# Patient Record
Sex: Female | Born: 1960 | Race: Black or African American | Hispanic: No | Marital: Single | Smoking: Former smoker
Health system: Southern US, Community
[De-identification: ages and names within clinical notes are randomized; demographics above are authoritative.]

## PROBLEM LIST (undated history)

## (undated) DIAGNOSIS — D649 Anemia, unspecified: Secondary | ICD-10-CM

## (undated) DIAGNOSIS — E669 Obesity, unspecified: Secondary | ICD-10-CM

## (undated) DIAGNOSIS — N750 Cyst of Bartholin's gland: Secondary | ICD-10-CM

## (undated) DIAGNOSIS — A159 Respiratory tuberculosis unspecified: Secondary | ICD-10-CM

## (undated) DIAGNOSIS — N764 Abscess of vulva: Secondary | ICD-10-CM

## (undated) DIAGNOSIS — I10 Essential (primary) hypertension: Secondary | ICD-10-CM

## (undated) DIAGNOSIS — N898 Other specified noninflammatory disorders of vagina: Secondary | ICD-10-CM

## (undated) DIAGNOSIS — K859 Acute pancreatitis without necrosis or infection, unspecified: Secondary | ICD-10-CM

## (undated) DIAGNOSIS — M1990 Unspecified osteoarthritis, unspecified site: Secondary | ICD-10-CM

## (undated) DIAGNOSIS — T7840XA Allergy, unspecified, initial encounter: Secondary | ICD-10-CM

## (undated) DIAGNOSIS — K529 Noninfective gastroenteritis and colitis, unspecified: Secondary | ICD-10-CM

## (undated) DIAGNOSIS — F17200 Nicotine dependence, unspecified, uncomplicated: Secondary | ICD-10-CM

## (undated) HISTORY — PX: ABDOMINAL HYSTERECTOMY: SHX81

## (undated) HISTORY — PX: ABDOMINAL SURGERY: SHX537

## (undated) HISTORY — DX: Cyst of Bartholin's gland: N75.0

## (undated) HISTORY — PX: CHOLECYSTECTOMY: SHX55

## (undated) HISTORY — DX: Respiratory tuberculosis unspecified: A15.9

## (undated) HISTORY — PX: TONSILLECTOMY: SUR1361

## (undated) HISTORY — PX: TUBAL LIGATION: SHX77

## (undated) HISTORY — DX: Obesity, unspecified: E66.9

## (undated) HISTORY — DX: Other specified noninflammatory disorders of vagina: N89.8

## (undated) HISTORY — DX: Nicotine dependence, unspecified, uncomplicated: F17.200

## (undated) HISTORY — DX: Allergy, unspecified, initial encounter: T78.40XA

---

## 2000-09-07 ENCOUNTER — Encounter (INDEPENDENT_AMBULATORY_CARE_PROVIDER_SITE_OTHER): Payer: Self-pay

## 2000-09-07 ENCOUNTER — Ambulatory Visit (HOSPITAL_COMMUNITY): Admission: RE | Admit: 2000-09-07 | Discharge: 2000-09-07 | Payer: Self-pay | Admitting: Obstetrics & Gynecology

## 2001-01-30 ENCOUNTER — Encounter (INDEPENDENT_AMBULATORY_CARE_PROVIDER_SITE_OTHER): Payer: Self-pay | Admitting: Specialist

## 2001-01-30 ENCOUNTER — Observation Stay (HOSPITAL_COMMUNITY): Admission: RE | Admit: 2001-01-30 | Discharge: 2001-01-31 | Payer: Self-pay | Admitting: Obstetrics & Gynecology

## 2002-07-23 ENCOUNTER — Ambulatory Visit (HOSPITAL_COMMUNITY): Admission: RE | Admit: 2002-07-23 | Discharge: 2002-07-23 | Payer: Self-pay | Admitting: Pulmonary Disease

## 2002-07-23 ENCOUNTER — Emergency Department (HOSPITAL_COMMUNITY): Admission: EM | Admit: 2002-07-23 | Discharge: 2002-07-23 | Payer: Self-pay | Admitting: *Deleted

## 2002-08-26 ENCOUNTER — Emergency Department (HOSPITAL_COMMUNITY): Admission: EM | Admit: 2002-08-26 | Discharge: 2002-08-26 | Payer: Self-pay | Admitting: Emergency Medicine

## 2003-02-27 ENCOUNTER — Other Ambulatory Visit: Admission: RE | Admit: 2003-02-27 | Discharge: 2003-02-27 | Payer: Self-pay | Admitting: Obstetrics & Gynecology

## 2003-09-29 ENCOUNTER — Ambulatory Visit (HOSPITAL_COMMUNITY): Admission: RE | Admit: 2003-09-29 | Discharge: 2003-09-29 | Payer: Self-pay | Admitting: Pulmonary Disease

## 2004-01-19 ENCOUNTER — Encounter: Admission: RE | Admit: 2004-01-19 | Discharge: 2004-04-18 | Payer: Self-pay | Admitting: Neurology

## 2004-04-30 ENCOUNTER — Other Ambulatory Visit: Admission: RE | Admit: 2004-04-30 | Discharge: 2004-04-30 | Payer: Self-pay | Admitting: Obstetrics & Gynecology

## 2005-05-11 ENCOUNTER — Other Ambulatory Visit: Admission: RE | Admit: 2005-05-11 | Discharge: 2005-05-11 | Payer: Self-pay | Admitting: Specialist

## 2008-02-25 ENCOUNTER — Emergency Department (HOSPITAL_COMMUNITY): Admission: EM | Admit: 2008-02-25 | Discharge: 2008-02-25 | Payer: Self-pay | Admitting: Emergency Medicine

## 2008-02-27 ENCOUNTER — Emergency Department (HOSPITAL_COMMUNITY): Admission: EM | Admit: 2008-02-27 | Discharge: 2008-02-27 | Payer: Self-pay | Admitting: Emergency Medicine

## 2008-03-27 ENCOUNTER — Ambulatory Visit (HOSPITAL_COMMUNITY): Admission: RE | Admit: 2008-03-27 | Discharge: 2008-03-27 | Payer: Self-pay | Admitting: Pulmonary Disease

## 2009-09-10 ENCOUNTER — Ambulatory Visit (HOSPITAL_COMMUNITY): Admission: RE | Admit: 2009-09-10 | Discharge: 2009-09-10 | Payer: Self-pay | Admitting: Pulmonary Disease

## 2009-11-28 HISTORY — PX: FOOT SURGERY: SHX648

## 2010-08-12 ENCOUNTER — Emergency Department (HOSPITAL_COMMUNITY): Admission: EM | Admit: 2010-08-12 | Discharge: 2010-08-12 | Payer: Self-pay | Admitting: Emergency Medicine

## 2010-08-21 ENCOUNTER — Encounter: Payer: Self-pay | Admitting: Orthopedic Surgery

## 2010-09-20 ENCOUNTER — Emergency Department (HOSPITAL_COMMUNITY): Admission: EM | Admit: 2010-09-20 | Discharge: 2010-09-20 | Payer: Self-pay | Admitting: Emergency Medicine

## 2010-09-20 ENCOUNTER — Encounter: Payer: Self-pay | Admitting: Orthopedic Surgery

## 2010-09-21 ENCOUNTER — Encounter: Payer: Self-pay | Admitting: Orthopedic Surgery

## 2010-09-22 ENCOUNTER — Ambulatory Visit: Payer: Self-pay | Admitting: Orthopedic Surgery

## 2010-09-22 DIAGNOSIS — M79609 Pain in unspecified limb: Secondary | ICD-10-CM | POA: Insufficient documentation

## 2010-09-22 DIAGNOSIS — E1149 Type 2 diabetes mellitus with other diabetic neurological complication: Secondary | ICD-10-CM

## 2010-09-22 DIAGNOSIS — M659 Synovitis and tenosynovitis, unspecified: Secondary | ICD-10-CM

## 2010-10-04 ENCOUNTER — Encounter: Payer: Self-pay | Admitting: Orthopedic Surgery

## 2010-10-07 ENCOUNTER — Telehealth: Payer: Self-pay | Admitting: Orthopedic Surgery

## 2010-10-08 ENCOUNTER — Encounter: Payer: Self-pay | Admitting: Orthopedic Surgery

## 2010-10-08 ENCOUNTER — Telehealth: Payer: Self-pay | Admitting: Orthopedic Surgery

## 2010-10-14 ENCOUNTER — Telehealth: Payer: Self-pay | Admitting: Orthopedic Surgery

## 2010-10-20 ENCOUNTER — Encounter: Payer: Self-pay | Admitting: Orthopedic Surgery

## 2010-11-09 ENCOUNTER — Encounter: Payer: Self-pay | Admitting: Orthopedic Surgery

## 2010-12-28 NOTE — Letter (Signed)
Summary: *Orthopedic Consult Note  [facility]  [address]  [place], [place] [address]   Phone: [phone]  Fax: [phone]    Re:    Shawna Huerta DOB:    [birth year]   Dear: Renae Fickle   Thank you for requesting that we see the above patient for consultation.  A copy of the detailed office note will be sent under separate cover, for your review.  Evaluation today is consistent with:  1)  TENOSYNOVITIS OF FOOT AND ANKLE (ICD-727.06) 2)  DIABETIC PERIPHERAL NEUROPATHY (ICD-250.60) 3)  FOOT PAIN, BILATERAL (ICD-729.5) 4)  FAMILY HISTORY OF ARTHRITIS (ICD-V17.7) 5)  FAMILY HISTORY OF DIABETES (ICD-V18.0)   Our recommendation is for:  orthotics to support the posterior tibial tendon especially on the LEFT side Out of work for one month to rest her feet Referred her foot and ankle specialist for further diabetic foot care       Thank you for this opportunity to look after your patient.  Sincerely,   Terrance Mass. MD.

## 2010-12-28 NOTE — Letter (Signed)
Summary: Out of Work  [facility]  [address]  [place], [place] [address]   Phone: [phone]  Fax: [phone]    September 22, 2010   Employee:  LATIANA TOMEI    To Whom It May Concern:   For Medical reasons, please excuse the above named employee from work for the following dates:  Start: 09/22/10  End:   10/22/10 or until further notice  If you need additional information, please feel free to contact our office.         Sincerely,    Dr. Terrance Mass.

## 2010-12-28 NOTE — Medication Information (Signed)
Summary: Tax adviser   Imported By: Cammie Sickle 09/28/2010 15:48:29  _____________________________________________________________________  External Attachment:    Type:   Image     Comment:   External Document

## 2010-12-28 NOTE — Assessment & Plan Note (Signed)
Summary: RT FOOT PAIN XR [facility] 08/10/10/AETNA/BSF   Visit Type:  Initial Consult Referring Provider:  Dr. Juanetta Gosling Primary Provider:  Dr. Juanetta Gosling  CC:  right foot pain.  History of Present Illness: I saw Shawna Huerta in the office today for an initial visit.  She is a 50 years old woman with the complaint of:  right foot pain.  No injury.  Xrays right foot 08/12/10 [facility] for review.  Meds: Glucovance.  FYI also noted to have left ankle pain, xrays [facility] left ankle 09/20/10.  The patient has bilateral throbbing pain on the LEFT side she has medial pain on the RIGHT side just dorsal lateral pain.  The pain came on suddenly and is associated with numbness and swelling.  She denies any trauma    Allergies (verified): No Known Drug Allergies  Past History:  Past Medical History: diabetes  Past Surgical History: tonsils gallbladder hysterectomy  Family History: FH of Cancer:  Family History of Diabetes Family History of Arthritis  Social History: Patient is married.  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 Cardiovascular:  Denies chest pain, palpitations, fainting, and murmurs. Respiratory:  Complains of couch; denies short of breath, wheezing, tightness, pain on inspiration, and snoring . Gastrointestinal:  Complains of nausea and constipation; denies heartburn, vomiting, diarrhea, and blood in your stools. Genitourinary:  Complains of urgency; denies frequency, difficulty urinating, painful urination, flank pain, and bleeding in urine. Neurologic:  Denies numbness, tingling, unsteady gait, dizziness, tremors, and seizure. Musculoskeletal:  Complains of swelling; denies joint pain, instability, stiffness, redness, heat, and muscle pain. Endocrine:  Denies excessive thirst, exessive urination, and heat or cold intolerance. Psychiatric:  Denies  nervousness, depression, anxiety, and hallucinations. Skin:  Denies changes in the skin, poor healing, rash, itching, and redness. HEENT:  Complains of blurred or double vision; denies eye pain, redness, and watering. Immunology:  Denies seasonal allergies, sinus problems, and allergic to bee stings. Hemoatologic:  Denies easy bleeding and brusing.  Physical Exam  Skin:  intact without lesions or rashes Psych:  alert and cooperative; normal mood and affect; normal attention span and concentration   Foot/Ankle Exam  General:    Well-developed, well-nourished ,normal body habitus; no deformities, normal grooming.    Gait:    Normal heel-toe gait pattern bilaterally.    Inspection:    Inspection reveals no deformity, ecchymosis or swelling.   Palpation:    she seemed to have more diffuse than pinpoint tenderness.  The tenderness seemed to be in the dorsum of the foot especially on the RIGHT and then there was some tenderness medially along the posterior tibial tendon  Vascular:    she had a 2+ palpable dorsalis pedis and a 2+ palpable posterior tibial pulse, color was good capillary refill was good  Sensory:    gross sensation was intact in terms of pressure, sharp, proprioception.  The [employer] test was not performed  Motor:    gross motor strength was normal in both feet and ankles  Reflexes:    2+ reflexes  Ankle Exam:    Right:    Inspection/Palpation:  normal range of motion in the RIGHT ankle    Left:    Inspection/Palpation:  normal range of motion LEFT ankle  Anterior Drawer:    Right negative; Left negative   Impression & Recommendations:  Problem # 1:  FOOT PAIN, BILATERAL (ICD-729.5) Assessment New  Orders: Orthopedic Surgeon Referral (Ortho Surgeon)  Problem # 2:  DIABETIC PERIPHERAL NEUROPATHY (ICD-250.60) Assessment: New  Orders: New Patient Level III ([address]) very concerned that this patient probably has diabetic feet and she should see a  foot and ankle specialist.  I would also think she would benefit from orthotics for both of her feet.  Her RIGHT foot x-ray shows mid foot degenerative changes especially of the tarsal bones involving the navicular and first cuboid bone.  Overall alignment seems to be normal.  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  Patient Instructions: 1)  Diabetic Feet  2)  PTTD Type I left  3)  Refer to Dr Lestine Box  4)  Please schedule a follow-up appointment as needed.   Orders Added: 1)  Orthopedic Surgeon Referral [Ortho Surgeon] 2)  New Patient Level III [phone]

## 2010-12-28 NOTE — Letter (Signed)
Summary: History form  History form   Imported By: Jacklynn Ganong 10/07/2010 08:37:56  _____________________________________________________________________  External Attachment:    Type:   Image     Comment:   External Document

## 2010-12-28 NOTE — Progress Notes (Signed)
Summary: patient requests updated work note to RTW 10/11/10  Phone Note Call from Patient   Caller: Patient Summary of Call: Patient requests an updated return to work note for Monday 10/11/10, as states feet are feeling much better. Originally, return to work note was to return 10/22/10,"or until further notice"   New note given to patient, noting "as tolerated."   Advised patient we have faxed her referral to Dr. Lestine Box. ** NOTE: Patient has new ph# [phone] ** I have fwd'd it to Dr. Lestine Box also. Initial call taken by: Cammie Sickle,  October 08, 2010 9:16 AM

## 2010-12-28 NOTE — Progress Notes (Signed)
Summary: Appointment with Dr. Lestine Box.  Phone Note From Other Clinic   Caller: Referral Coordinator Reason for Call: Schedule Patient Appt Summary of Call: Patient has an appointment with Dr. Lestine Box on 10-20-10 at 8:40 am.  Initial call taken by: Waldon Reining,  October 14, 2010 2:52 PM

## 2010-12-28 NOTE — Letter (Signed)
Summary: [employer] disability form  [employer] form   Imported By: Cammie Sickle 10/27/2010 18:38:35  _____________________________________________________________________  External Attachment:    Type:   Image     Comment:   External Document

## 2010-12-28 NOTE — Progress Notes (Signed)
Summary: Referral to Dr. Lestine Box.  Phone Note Outgoing Call   Call placed by: Waldon Reining,  October 07, 2010 12:58 PM Call placed to: Specialist Action Taken: Information Sent Summary of Call: I faxed a referral for this patient to [facility] to Dr. Lestine Box to be seen for bilateral foot pain.

## 2010-12-28 NOTE — Consult Note (Signed)
Summary: Consultation note from Dr Lovie Chol  Consultation note from Dr Lovie Chol   Imported By: Jacklynn Ganong 10/20/2010 11:41:49  _____________________________________________________________________  External Attachment:    Type:   Image     Comment:   External Document

## 2010-12-28 NOTE — Letter (Signed)
Summary: Out of Work Updated  [facility]  [address]  [place], [place] [address]   Phone: [phone]  Fax: [phone]    October 08, 2010   Employee:  POLA FURNO    To Whom It May Concern:   For Medical reasons, please excuse the above named employee from work for the following dates:  Start:   09/22/10  End/Return to work:    10/11/10 as tolerated  (no restrictions)  If you need additional information, please feel free to contact our office.         Sincerely,    Terrance Mass, MD

## 2010-12-30 NOTE — Consult Note (Signed)
Summary: Consultation Report from Dr. Leonides Grills  Consultation Report from Dr. Leonides Grills   Imported By: Jacklynn Ganong 11/09/2010 10:38:22  _____________________________________________________________________  External Attachment:    Type:   Image     Comment:   External Document

## 2011-01-28 IMAGING — CR DG ANKLE COMPLETE 3+V*L*
3 series · 3 of 3 positions shown · non-contrast
Comparison: None.

CLINICAL DATA: Left ankle pain for 1 day.  No injury.

LEFT ANKLE COMPLETE - 3+ VIEW

[view not recorded (1 of 3)]
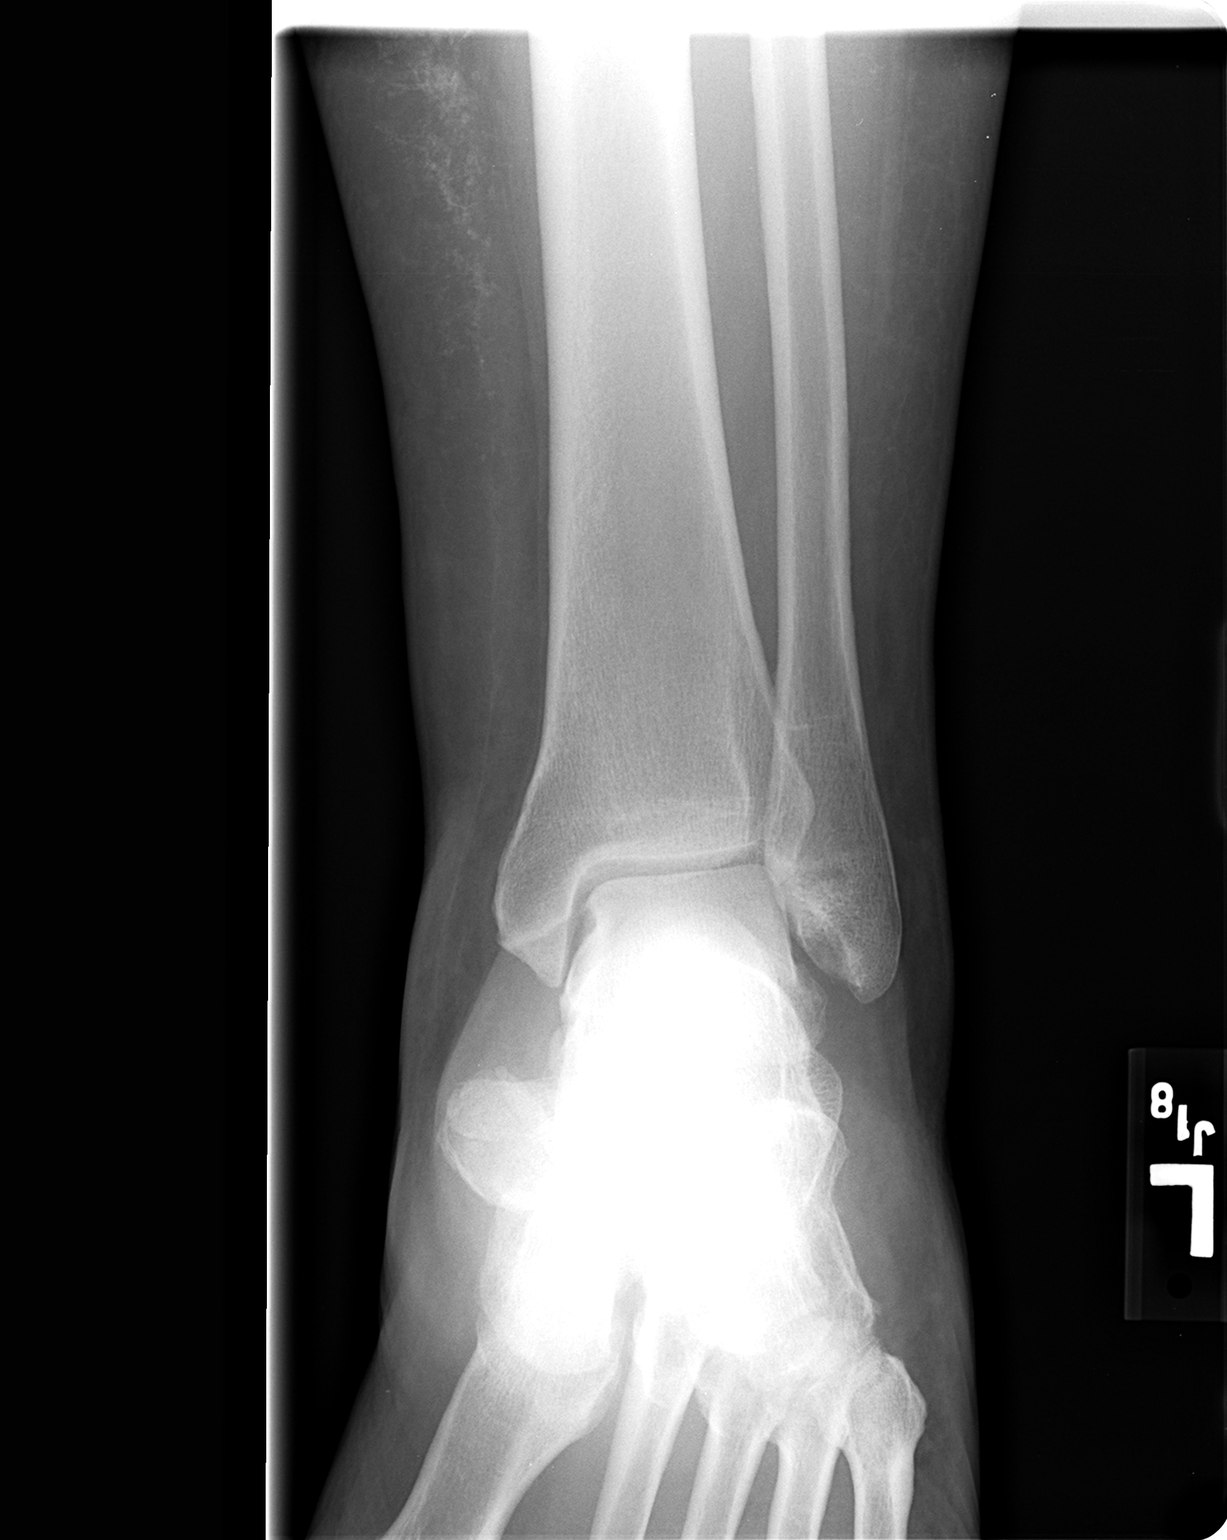

[view not recorded (2 of 3)]
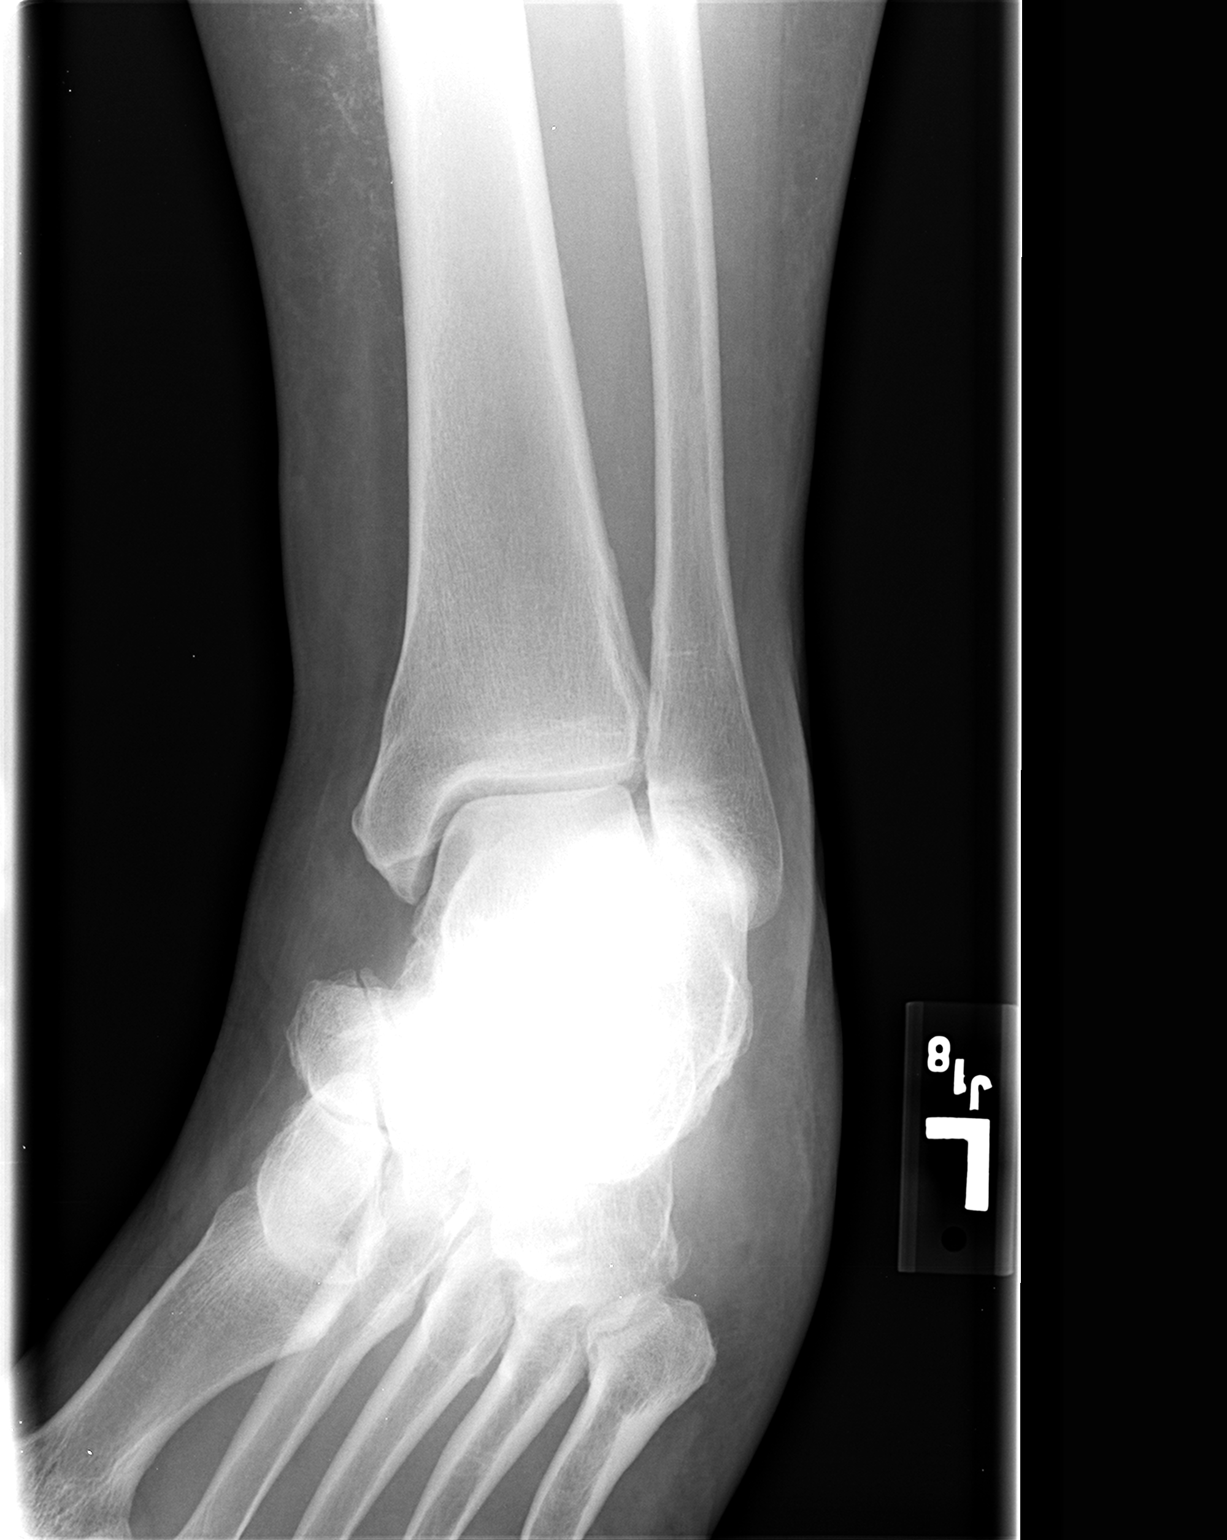

[view not recorded (3 of 3)]
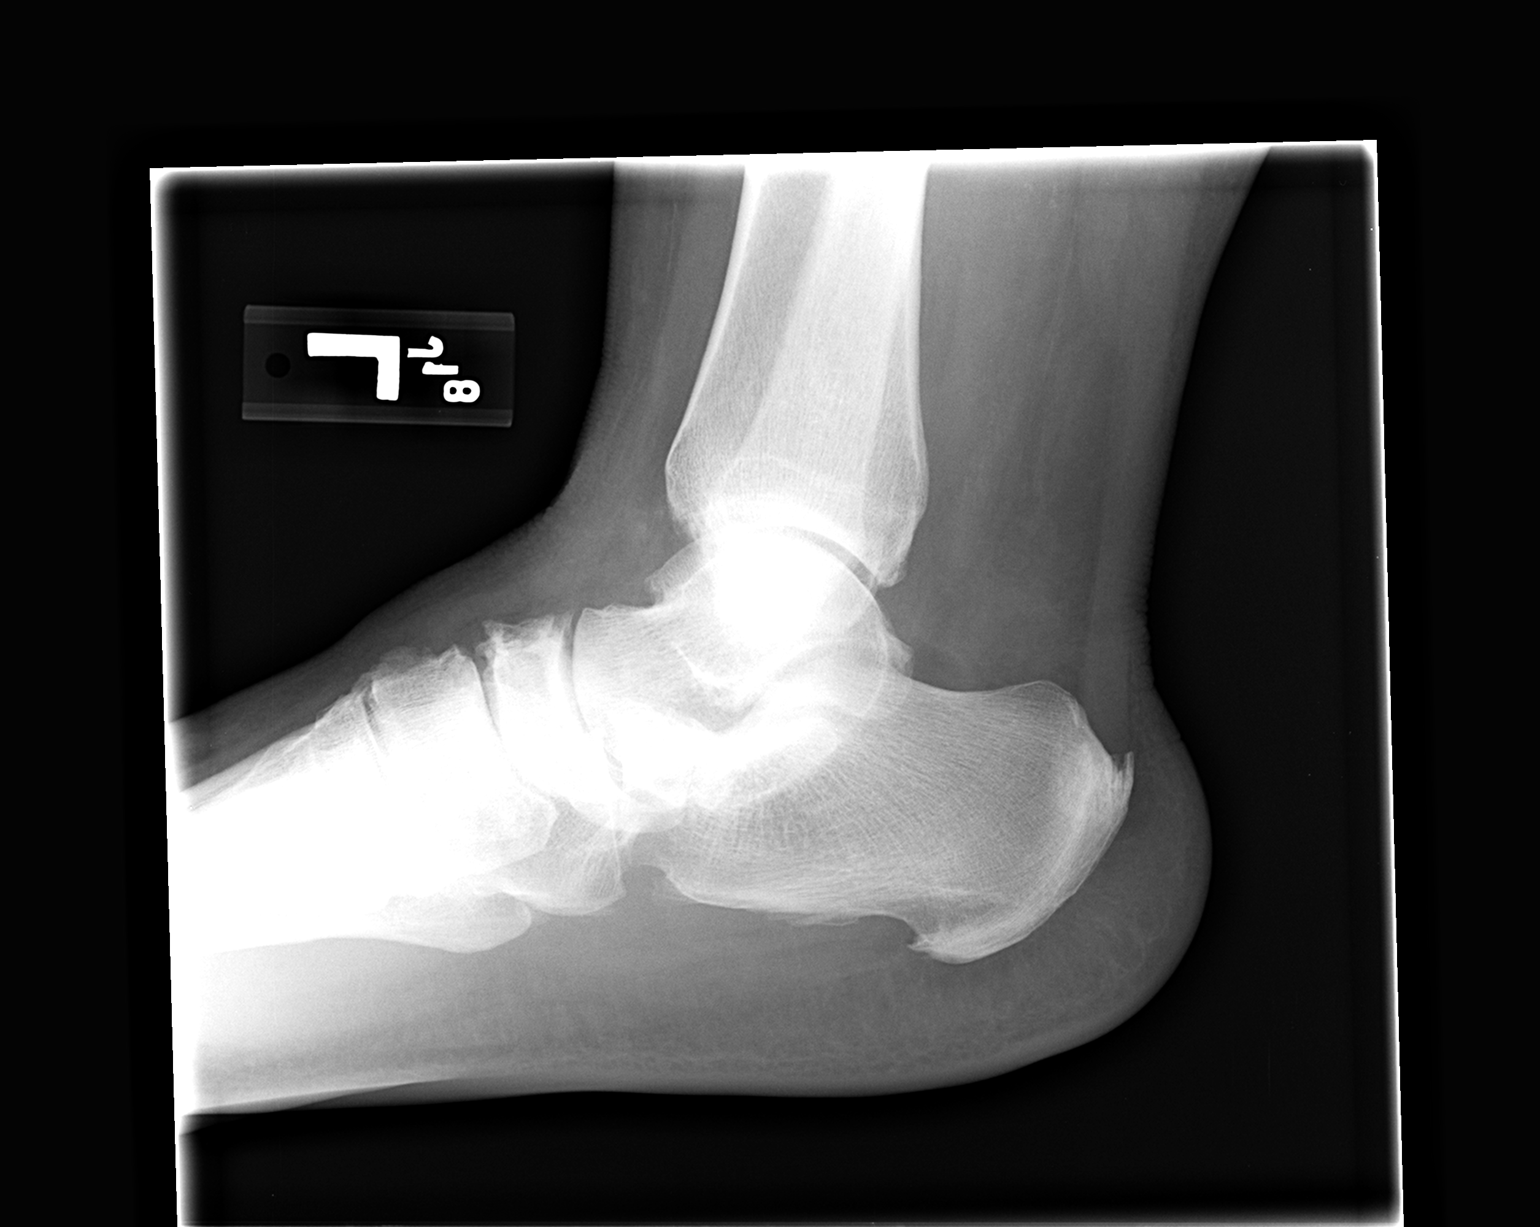

[3 of 3 positions shown; findings below may reference images not displayed]

FINDINGS: There are degenerative changes present associated with
the midfoot.  There are no fractures, dislocations, or destructive
changes.
IMPRESSION: Degenerative arthritic changes present associated with the midfoot.
No acute findings.

## 2011-04-05 ENCOUNTER — Encounter (HOSPITAL_BASED_OUTPATIENT_CLINIC_OR_DEPARTMENT_OTHER)
Admission: RE | Admit: 2011-04-05 | Discharge: 2011-04-05 | Disposition: A | Discharge status: Home / Self Care | Payer: Private Health Insurance - Indemnity | Source: Ambulatory Visit | Attending: Orthopedic Surgery | Admitting: Orthopedic Surgery

## 2011-04-05 LAB — BASIC METABOLIC PANEL
BUN: 9 mg/dL (ref 6–23)
CO2: 28 mEq/L (ref 19–32)
Calcium: 9.6 mg/dL (ref 8.4–10.5)
Chloride: 99 mEq/L (ref 96–112)
Creatinine, Ser: 0.63 mg/dL (ref 0.4–1.2)
GFR calc Af Amer: >60 mL/min (ref >60)
GFR calc non Af Amer: >60 mL/min (ref >60)
Glucose, Bld: 182 mg/dL — ABNORMAL HIGH (ref 70–99)
Potassium: 4.7 mEq/L (ref 3.5–5.1)
Sodium: 135 mEq/L (ref 135–145)

## 2011-04-06 ENCOUNTER — Ambulatory Visit (HOSPITAL_BASED_OUTPATIENT_CLINIC_OR_DEPARTMENT_OTHER)
Admission: RE | Admit: 2011-04-06 | Discharge: 2011-04-07 | Disposition: A | Discharge status: Home / Self Care | Payer: Private Health Insurance - Indemnity | Source: Ambulatory Visit | Attending: Orthopedic Surgery | Admitting: Orthopedic Surgery

## 2011-04-06 DIAGNOSIS — M659 Unspecified synovitis and tenosynovitis, unspecified site: Secondary | ICD-10-CM | POA: Insufficient documentation

## 2011-04-06 DIAGNOSIS — Q6650 Congenital pes planus, unspecified foot: Secondary | ICD-10-CM | POA: Insufficient documentation

## 2011-04-06 DIAGNOSIS — M6240 Contracture of muscle, unspecified site: Secondary | ICD-10-CM | POA: Insufficient documentation

## 2011-04-06 DIAGNOSIS — Z01810 Encounter for preprocedural cardiovascular examination: Secondary | ICD-10-CM | POA: Insufficient documentation

## 2011-04-06 DIAGNOSIS — M76829 Posterior tibial tendinitis, unspecified leg: Secondary | ICD-10-CM | POA: Insufficient documentation

## 2011-04-06 DIAGNOSIS — Q742 Other congenital malformations of lower limb(s), including pelvic girdle: Secondary | ICD-10-CM | POA: Insufficient documentation

## 2011-04-06 DIAGNOSIS — E119 Type 2 diabetes mellitus without complications: Secondary | ICD-10-CM | POA: Insufficient documentation

## 2011-04-06 DIAGNOSIS — Z01812 Encounter for preprocedural laboratory examination: Secondary | ICD-10-CM | POA: Insufficient documentation

## 2011-04-06 LAB — GLUCOSE, CAPILLARY
Glucose-Capillary: 198 mg/dL — ABNORMAL HIGH (ref 70–99)
Glucose-Capillary: 146 mg/dL — ABNORMAL HIGH (ref 70–99)

## 2011-04-06 LAB — POCT HEMOGLOBIN-HEMACUE: Hemoglobin: 10.7 g/dL — ABNORMAL LOW (ref 12.0–15.0)

## 2011-04-14 NOTE — Op Note (Signed)
NAMEARIELL, GUNNELS                  ACCOUNT NO.:  [id number]  MEDICAL RECORD NO.:  [id number]           PATIENT TYPE:  LOCATION:                                 FACILITY:  PHYSICIAN:  Leonides Grills, M.D.     DATE OF BIRTH:  08-06-1961  DATE OF PROCEDURE:  04/06/2011 DATE OF DISCHARGE:                              OPERATIVE REPORT   PREOPERATIVE DIAGNOSES: 1. Right accessory navicular. 2. Right tight gastroc. 3. Right posterior tibial tendonitis. 4. Right valgus hindfoot malalignment. 5. Dorsal lateral talonavicular joint synovitis with spur formation. 6. Obesity.  POSTOPERATIVE DIAGNOSES: 1. Right accessory navicular. 2. Right tight gastroc. 3. Right posterior tibial tendonitis. 4. Right valgus hindfoot malalignment. 5. Dorsal lateral talonavicular joint synovitis with spur formation. 6. Obesity.  OPERATIONS: 1. Right Kidner procedure. 2. Right gastroc slide. 3. Right FDL to navicular/posterior tibial tendon transfer. 4. Right medialized calcaneal osteotomy. 5. Right talonavicular joint arthrotomy with tenosynovectomy and     excision talonavicular joint spurs.  ANESTHESIA:  General.  SURGEON:  Leonides Grills, MD  ASSISTANT:  Richardean Canal, PA  ESTIMATED BLOOD LOSS:  About 200-300 mL.  COMPLICATIONS:  None.  TOURNIQUET TIME:  Minimal.  DISPOSITION:  Stable to the PR.  INDICATIONS:  This is a 50 year old female who has had persistent posteromedial ankle pain as well as dorsolateral hindfoot pain that was interfering with her life due to the above pathology.  She was consented for the above procedure.  All risk of infection, nerve and vessel injury, nonunion, malunion, hardware tissue, hardware failure, persistent pain, worse pain, prolonged recovery, stiffness, arthritis, wound healing problems, DVT, PE, Charcot arthropathy were all explained. Questions were encouraged and answered.  DESCRIPTION OF PROCEDURE:  The patient was brought to the operating room,  placed in supine position after adequate general anesthesia was administered as well as Ancef gram IV piggyback.  The right lower extremity was then prepped and draped in a sterile manner over proximally thigh tourniquet.  We started the procedure with a longitudinal incision on the medial aspect of the gastrocnemius muscle tendinous junction.  Dissection was carried down through skin. Hemostasis was obtained.  Fascia was opened in line with the incision. Conjoined region was then developed between the gastroc and soleus. Soft tissue was then elevated off the posterior aspect of gastrocnemius. Sural nerve was identified and protected posteriorly throughout the case.  Gastrocnemius was then released with a curved Mayo scissors. This had excellent release of the tight gastroc.  Due to the patient's obesity, this made this entire procedure more difficult.  The area was copiously irrigated with normal saline.  Subcu was closed with 3-0 Vicryl.  Skin was closed with 4-0 nylon.  The limb was then gravity exsanguinated and tourniquet was elevated to 290 mmHg.  A longitudinal incision over the dorsolateral aspect of the right talonavicular joint was then made.  Dissection was carried down through skin.  Hemostasis was obtained.  Extensor retinaculum was opened over the extensor digitorum longus lateral to the dorsalis pedis and deep peroneal nerves. We then identified the talonavicular joint.  Again, we incised this, performed a  capsulotomy.  There was synovitis in this area and this was then removed.  We then carefully dissected out the spurs and removed the navicular and talonavicular joint spurs with curved corners osteotome and rongeur.  Once this was done, this was then palpated and found to be adequately decompressed.  There was palpable spurs through her skin but they are over the navicular cuneiform joint, but we did not address this or they did not bother her at this time.  The area was  copiously irrigated with normal saline.  We developed a venous tourniquet and again due to her obesity, this made this entire procedure much more difficult.  Before we had a venous tourniquet, it was much more difficult to visualize and we copiously irrigated the area with normal saline.  Bone wax applied to exposed bony surfaces.  Subcu was closed with 3-0 Vicryl.  Skin was closed with 4-0 nylon.  We then made a longitudinal incision over the lateral aspect of the calcaneal tuber perpendicular to the calcaneal pitch.  Dissection was carried down directly to bone.  Soft tissue was elevated superiorly and inferiorly. Homans were placed.  Then with a sagittal saw, calcaneal osteotomy was then made.  Once this was completed, we then loosened the osteotomy by applying an osteotome followed by a lamina spreader to soften the soft tissues and allow [place] to translate the heel adequately.  Again due to the patient's obesity and size, this made this more difficult.  Once we were loosened, we were then able to translate the heel about the calcaneus about 8 mm laterally and fixed this provisionally with a 2.0-mm K-wire. We then made a longitudinal incision on the midline posterior aspect of the heel, then placed two 6.7-mm Arthrex cannulated self-drilling, self- tapping screws over a K-wire.  Once this was placed, we did this in 2 parallel screws measuring 50 and 55 mm respectively.  Once these were placed, K-wires were removed.  Stress x-rays were obtained in a lateral and axial views, showed no gross motion, fixation, proposition, excellent alignment as well.  Once this was done, we copiously irrigated the wounds with normal saline.  Skin was closed with 4-0 nylon stitch over all wounds.  We then made a longitudinal incision over the posterior tibial tendon.  Dissection was carried down through skin. Hemostasis was obtained.  Due to the fact we had no tourniquet, this again made visualization more  difficult and due to the patient's size, this also made more difficult as well.  Once we dissected down to the flexor retinaculum, posterior tibial tendon was identified.  We then traced this to the accessory navicular.  There was a loose piece of bone that was quite large, almost a grape size bone prominence.  This was then carefully dissected out and shelled out.  Once this was done, we then prepared a bed within the bone of the navicular using a rongeur and then drilled a 1.8-mm drill hole into navicular tuberosity.  This was done to place a Mini suture anchor for 2-0 FiberWire stitch.  We then placed the anchor in place but did not suture posterior tibial tendon just yet.  We then identified the FPL tendon, traced this to knot of Sherilyn Cooter.  This was then tenotomized distal as possible.  Again due to the patient's size, this made this more difficult as well as the bleeding from lack of tourniquet.  Once we obtained a tendon, we then placed a small nick in the posterior tibial tendon and  then weaved the FPL through the posterior tibial tendon and incorporated this into the repair of the posterior tibial tendon to the navicular.  Almost like a trough-type technique.  Once we did this, we then sewed and anchored this down with 2-0 FiberWire stitches and had an outstanding repair.  We then placed the 2-0 FiberWire stitch, incorporated in the Mini suture anchor through the FPL tendon and posterior tibial tendon and then sewed this down to the expose cancellous bone of the navicular tuberosity. This had an Conservation officer, historic buildings.  The remaining part of the posterior tibial tendon and FPL tendon were repaired with 2-0 FiberWire stitch. Again, this had an outstanding repair.  There was no pulsatile bleeding. Wound was copiously irrigated with normal saline.  Subcu was closed with a 3-0 Vicryl.  Skin was closed with 4-0 nylon overall wounds.  Sterile dressing was applied.  Modified Jones dressing was  applied.  The patient was stable to the PR.  Once again due to the patient's obesity, this made the procedure much more difficult.     Leonides Grills, M.D.     PB/MEDQ  D:  04/06/2011  T:  04/07/2011  Job:  [id number]  Electronically Signed by Leonides Grills M.D. on 04/14/2011 03:09:02 PM

## 2011-04-15 NOTE — Op Note (Signed)
[facility]  Patient:    Shawna Huerta, Shawna Huerta                        MRN: [id number] Attending:  Freddy Finner, M.D. CC:         referring physician   Operative Report  PREOPERATIVE DIAGNOSES:       1. Dysfunctional uterine bleeding.                               2. Thickened endometrial stripe and slight                                  enlargement of uterus most consistent with                                  adenomyosis on pelvic ultrasound.                               3. Ovarian findings consistent with polycystic                                  ovarian syndrome.  POSTOPERATIVE DIAGNOSES:      1. Dysfunctional uterine bleeding.                               2. Thickened endometrial stripe and slight                                  enlargement of uterus most consistent with                                  adenomyosis on pelvic ultrasound.                               3. Ovarian findings consistent with polycystic                                  ovarian syndrome.                               4. Slight uterine enlargement.  Uterus sounds to                                  9.5 cm.                               5. Small endometrial polyp.                               6. Shaggy thickening of endometrial lining.  OPERATIVE PROCEDURE:  Hysteroscopy, dilatation and curettage.  SURGEON:                      Freddy Finner, M.D.  ANESTHESIA:                   General.  COMPLICATIONS:                None.  ESTIMATED BLOOD LOSS:         20 cc.                                Patient is a 50 year old black married female gravida 2, para 1 who has previously had tubal sterilization and who was followed by her regular medical doctor, Dr. Gerda Diss, for irregular vaginal bleeding of two to three years duration.  She has been tried on oral contraceptive pills without successful management for bleeding and she was seen in my office for the first  time on August 30, 2000 at which time her pelvic examination was thought to be remarkable for slight enlargement of the uterus, but was somewhat compromised by her body habitus.  Her hemoglobin at that time was 9.3.  Pelvic ultrasound was obtained which showed the uterus to be 8.8 x 5.5 x 4.9 cm with a 9.8 mm endometrial stripe and calcifications seen in the wall of the uterus.  There were fibrotic type changes in the myometrium possibly consistent with adenomyosis.  The ovaries were essentially normal, but had polyfollicular appearance of small follicles consistent with polycystic ovarian syndrome.  She is admitted now for surgery.                                She was admitted on morning of surgery, brought to the operating room, placed under adequate general anesthesia at her request.  She was placed in the dorsal lithotomy position using the Allen stirrups.  Betadine prep of perineum and vagina was carried out in the usual fashion.  Sterile drapes were applied.  Vivelle speculum was introduced and the cervix was ______ and grasped with a single tooth tenaculum.  Uterus sounded to 9.5 cm.  Cervix was progressively dilated to 23 with Shawnie Pons.  A 12.5 degree [facility] hysteroscope was introduced using 3% sorbitol as a distending medium.  Inspection of the endometrial cavity revealed what was thought to be some small polyps and a shaggy endometrial appearance.  The endometrium was carefully curetted with the Heaney curette and ______ forward with Garen Grams forceps.  Repeat inspection revealed adequate sampling and removal of the polyp.  The procedure was terminated at this point.  Sorbitol deficit was 40 cc.  Estimated intraoperative blood loss was less than or equal to 20 cc. The patient was awakened and taken to the recovery room in good condition. Will be discharged home with routine outpatient surgical instructions for follow-up in approximately two weeks in the office.  She is given  Darvocet to be taken as needed for postoperative pain.  She is to take Advil or Tylenol for less severe pain. DD:  09/06/00 TD:  09/07/00 Job: [id number]

## 2011-04-15 NOTE — Op Note (Signed)
[facility]  Patient:    Shawna Huerta, Shawna Huerta                        MRN: [id number] Proc. Date: 01/30/01 Adm. Date:  [id number] Attending:  Minette Headland                           Operative Report  PREOPERATIVE DIAGNOSIS:       Menorrhagia, suspected adenomyosis.  POSTOPERATIVE DIAGNOSIS:      Menorrhagia, suspected adenomyosis.  OPERATIVE PROCEDURE:          Total vaginal hysterectomy.  SURGEON:                      Freddy Finner, M.D.  ASSISTANT:                    Guy Sandifer. Arleta Creek, M.D.  ESTIMATED INTRAOPERATIVE BLOOD LOSS:  150 cc.  ANESTHESIA:                   General endotracheal.  INTRAOPERATIVE COMPLICATIONS:  None.  INDICATIONS:                  Details of the present illness are recorded in the admission note.  Patient was admitted on the morning of surgery, received a gram of Cefotan IV preoperatively and was placed in PAS hose.  DESCRIPTION OF PROCEDURE:     She was brought to the operating room, placed under adequate general endotracheal anesthesia and placed in the dorsal lithotomy position using the [facility] stirrup system.  Betadine prep with scrubbing followed by solution were carried out in the usual fashion and sterile drapes were applied.  A posterior weighted retractor was placed. Deavers were used to retract the anterior and lateral vaginal walls.  Cervix was visualized grossly with a Jacobs tenaculum.  Colpotomy incision was made while tenting the cul-de-sac with an Allis.  Cervix was circumscribed with a scalpel.  The ligature sutureless clamp was then used to develop uterosacral pedicles, bladder pillars, cardinal ligament pedicles and each were sealed and divided sharply.  The anterior peritoneum was entered.  Vessel pedicles were taken with the same instrument, sealed and divided and pedicle was taken above the vessels on each side.  The uterus was then delivered through the introitus.  The ligature clamp was  placed across the uteroovarian pedicle and sealed and divided on each side.  Angles of the vagina were then anchored to the uterosacrals with a mattress suture of 0 Monocryl.  Uterosacrals were plicated and the posterior peritoneum closed with a single interrupted 0 Monocryl suture.  Cuff was closed vertically with figure-of-eights of 0 Monocryl.  Hemostasis was complete.  Tubes and ovaries were inspected prior to closure of the cuff and were felt to be normal.  Foley catheter was placed. Patient was taken to recovery in good condition. DD:  01/30/01 TD:  01/30/01 Job: [id number]

## 2011-04-15 NOTE — Discharge Summary (Signed)
[facility]  Patient:    Shawna Huerta, Shawna Huerta                        MRN: [id number] Adm. Date:  [id number] Disc. Date: [id number] Attending:  Minette Headland                           Discharge Summary  DISCHARGE DIAGNOSES:          Uterine enlargement, focal adenomyosis.  PROCEDURE:                    Total vaginal hysterectomy.  COMPLICATIONS:                None.  DISPOSITION:                  Patient was in satisfactory and improved condition at the time of her discharge.  She is to have progressively increasing physical activity.  She is to take a regular diet.  She is to report to the office in approximately two weeks for postoperative followup. She is to call for fever, severe pain, or heavy bleeding.  She is given Percocet to take as needed for pain.  HISTORY OF PRESENT ILLNESS:   Recorded in the admission note.  PAST MEDICAL HISTORY:         Recorded in the admission note.  FAMILY HISTORY:               Recorded in the admission note.  REVIEW OF SYSTEMS:            Recorded in the admission note.  PHYSICAL EXAMINATION  PELVIC:                       Enlargement of the uterus.  She had clinical symptoms of severe menorrhagia unresponsive to oral contraceptives and unresponsive to previous D&C and cyclic progestin.  She was admitted at this time for vaginal surgery.  LABORATORIES:                 Admission hemoglobin 9.4, postoperative hemoglobin 8.9.  HOSPITAL COURSE:              Patient was admitted on the morning of surgery. The above described procedure was accomplished without difficulty.  The patients postoperative course was without major complications.  She did have a low grade temperature to 100 on the first postoperative evening but by the evening of the first postoperative day she had a relatively normal white count of 11.9.  She was afebrile.  Hemoglobin was 8.9.  The abdomen was soft.  She was having no vaginal bleeding.   She was tolerating a regular diet and having adequate bladder function.  She was discharged home with disposition as noted above.  Will follow-up in the office in approximately two weeks. DD:  02/15/01 TD:  02/16/01 Job: [id number]

## 2011-04-15 NOTE — H&P (Signed)
[facility]  Patient:    Shawna Huerta, Shawna Huerta                        MRN: [id number] Adm. Date:  01/30/01 Attending:  Freddy Finner, M.D.                         History and Physical  ADMITTING DIAGNOSES:          1. Uterine adenomyosis.                               2. Severe menorrhagia unresponsive to oral                                  contraceptives in the past, unresponsive to                                  hysteroscopy dilation and curettage and                                  cyclic progestin.  HISTORY OF PRESENT ILLNESS:   The patient is a 50 year old black single female, gravida 2, para 1, who has a long history of irregular menses of 2-3 years duration.  She has tried oral contraceptives for control of her bleeding without success.  Her initial examination in my office in October 2001, her hemoglobin was 9.6 and was 9.6 on her most recent exam here in the office on January 26, 2001.  She had hysteroscopy D&C at the [facility] on September 07, 2000.  At that time, a small endometrial polyp was noted, and there was shaggy thickening of the endometrial lining.  This was benign histologically.  The patient has persisted with her regular menorrhagia, and she has requested definitive surgical intervention.  She is admitted at this time for vaginal hysterectomy.  REVIEW OF SYSTEMS:            Her current review of systems is otherwise negative.  There are no cardiopulmonary, GI, or GU complaints.  PAST MEDICAL HISTORY:         She has a history of rheumatic fever, reflux esophagitis.  She has previously been treated for gonorrhea.  She has a history of drug abuse in the past but is not currently using drugs of any kind.  PAST SURGICAL HISTORY:        Removal of her gallbladder and bilateral tubal ligation.  She had one vaginal birth at [redacted] weeks gestation in 56.  That child is alive and well.  She has no known allergies to medications.   She  has never had a blood transfusion.  She does not use cigarettes.  FAMILY HISTORY:               Noncontributory.  PHYSICAL EXAMINATION:  GENERAL:                      The patient is a massively obese black female in no acute distress at the time of the exam.  HEENT:  Grossly within normal limits.  NECK:                         Thyroid gland is not palpably enlarged.  CHEST:                        Clear to auscultation.  There are no rales or rhonchi heard.  HEART:                        Normal sinus rhythm without murmur, rub or gallops.  ABDOMEN:                      Obese.  PELVIC:                       External genitalia, vagina, and cervix are normal to inspection.  There is good descent of the cervix with traction. Bimanual the uterus is slightly increased in size, but this is difficult due to the patients body habitus.  There are no palpable adnexal masses. Rectovaginal exam confirms these findings.  ASSESSMENT:                   Menorrhagia and dysfunctional uterine bleeding, unresponsive to conservative therapy.  PLAN:                         Total vaginal hysterectomy.  The patient has reviewed a video in the office describing the operative procedure and has decided to proceed. DD:  01/29/01 TD:  01/29/01 Job: [id number]

## 2011-08-22 LAB — URINALYSIS, ROUTINE W REFLEX MICROSCOPIC
Bilirubin Urine: NEGATIVE
Glucose, UA: NEGATIVE
Hgb urine dipstick: NEGATIVE
Ketones, ur: NEGATIVE
Nitrite: NEGATIVE
Protein, ur: NEGATIVE
Specific Gravity, Urine: >1.03 — ABNORMAL HIGH
Urobilinogen, UA: 0.2
pH: 5

## 2011-09-28 ENCOUNTER — Other Ambulatory Visit (HOSPITAL_COMMUNITY): Payer: Self-pay | Admitting: Family Medicine

## 2011-09-28 DIAGNOSIS — Z139 Encounter for screening, unspecified: Secondary | ICD-10-CM

## 2011-10-03 ENCOUNTER — Ambulatory Visit (HOSPITAL_COMMUNITY)
Admission: RE | Admit: 2011-10-03 | Discharge: 2011-10-03 | Disposition: A | Discharge status: Home / Self Care | Payer: Private Health Insurance - Indemnity | Source: Ambulatory Visit | Attending: Family Medicine | Admitting: Family Medicine

## 2011-10-03 DIAGNOSIS — Z1231 Encounter for screening mammogram for malignant neoplasm of breast: Secondary | ICD-10-CM | POA: Insufficient documentation

## 2011-10-03 DIAGNOSIS — Z139 Encounter for screening, unspecified: Secondary | ICD-10-CM

## 2012-03-07 ENCOUNTER — Emergency Department (HOSPITAL_COMMUNITY)
Admission: EM | Admit: 2012-03-07 | Discharge: 2012-03-08 | Disposition: A | Discharge status: Home / Self Care | Payer: Private Health Insurance - Indemnity | Attending: Emergency Medicine | Admitting: Emergency Medicine

## 2012-03-07 ENCOUNTER — Encounter (HOSPITAL_COMMUNITY): Payer: Self-pay | Admitting: *Deleted

## 2012-03-07 DIAGNOSIS — A599 Trichomoniasis, unspecified: Secondary | ICD-10-CM

## 2012-03-07 DIAGNOSIS — R109 Unspecified abdominal pain: Secondary | ICD-10-CM | POA: Insufficient documentation

## 2012-03-07 DIAGNOSIS — F17200 Nicotine dependence, unspecified, uncomplicated: Secondary | ICD-10-CM | POA: Insufficient documentation

## 2012-03-07 DIAGNOSIS — E119 Type 2 diabetes mellitus without complications: Secondary | ICD-10-CM | POA: Insufficient documentation

## 2012-03-07 DIAGNOSIS — R197 Diarrhea, unspecified: Secondary | ICD-10-CM | POA: Insufficient documentation

## 2012-03-07 DIAGNOSIS — N72 Inflammatory disease of cervix uteri: Secondary | ICD-10-CM

## 2012-03-07 DIAGNOSIS — N898 Other specified noninflammatory disorders of vagina: Secondary | ICD-10-CM | POA: Insufficient documentation

## 2012-03-07 DIAGNOSIS — N390 Urinary tract infection, site not specified: Secondary | ICD-10-CM

## 2012-03-07 LAB — URINALYSIS, ROUTINE W REFLEX MICROSCOPIC
Glucose, UA: NEGATIVE mg/dL
Ketones, ur: 15 mg/dL — AB
Nitrite: NEGATIVE
Specific Gravity, Urine: >1.03 — ABNORMAL HIGH (ref 1.005–1.030)
Urobilinogen, UA: 0.2 mg/dL (ref 0.0–1.0)
pH: 5.5 (ref 5.0–8.0)

## 2012-03-07 LAB — URINE MICROSCOPIC-ADD ON

## 2012-03-07 LAB — DIFFERENTIAL
Basophils Absolute: 0 10*3/uL (ref 0.0–0.1)
Basophils Relative: 0 % (ref 0–1)
Eosinophils Absolute: 0.1 10*3/uL (ref 0.0–0.7)
Eosinophils Relative: 1 % (ref 0–5)
Lymphocytes Relative: 27 % (ref 12–46)
Lymphs Abs: 2.8 10*3/uL (ref 0.7–4.0)
Monocytes Absolute: 0.6 10*3/uL (ref 0.1–1.0)
Monocytes Relative: 6 % (ref 3–12)
Neutro Abs: 6.8 10*3/uL (ref 1.7–7.7)
Neutrophils Relative %: 66 % (ref 43–77)

## 2012-03-07 LAB — BASIC METABOLIC PANEL
BUN: 6 mg/dL (ref 6–23)
CO2: 26 mEq/L (ref 19–32)
Calcium: 9.6 mg/dL (ref 8.4–10.5)
Chloride: 102 mEq/L (ref 96–112)
Creatinine, Ser: 0.63 mg/dL (ref 0.50–1.10)
GFR calc Af Amer: >90 mL/min (ref >90)
GFR calc non Af Amer: >90 mL/min (ref >90)
Glucose, Bld: 90 mg/dL (ref 70–99)
Potassium: 3.5 mEq/L (ref 3.5–5.1)
Sodium: 139 mEq/L (ref 135–145)

## 2012-03-07 LAB — CBC
HCT: 34.5 % — ABNORMAL LOW (ref 36.0–46.0)
Hemoglobin: 10.7 g/dL — ABNORMAL LOW (ref 12.0–15.0)
MCH: 20.9 pg — ABNORMAL LOW (ref 26.0–34.0)
MCHC: 31 g/dL (ref 30.0–36.0)
MCV: 67.3 fL — ABNORMAL LOW (ref 78.0–100.0)
Platelets: 208 10*3/uL (ref 150–400)
RBC: 5.13 MIL/uL — ABNORMAL HIGH (ref 3.87–5.11)
RDW: 17.2 % — ABNORMAL HIGH (ref 11.5–15.5)
WBC: 10.3 10*3/uL (ref 4.0–10.5)

## 2012-03-07 LAB — WET PREP, GENITAL
Clue Cells Wet Prep HPF POC: NONE SEEN
Yeast Wet Prep HPF POC: NONE SEEN

## 2012-03-07 MED ORDER — CEFTRIAXONE SODIUM 250 MG IJ SOLR
250.0000 mg | Freq: Once | INTRAMUSCULAR | Status: AC
  Administered 2012-03-08: 250 mg via INTRAMUSCULAR
  Filled 2012-03-07: qty 250

## 2012-03-07 MED ORDER — AZITHROMYCIN 250 MG PO TABS
1000.0000 mg | ORAL_TABLET | Freq: Once | ORAL | Status: AC
  Administered 2012-03-08: 1000 mg via ORAL
  Filled 2012-03-07: qty 4

## 2012-03-07 NOTE — ED Provider Notes (Signed)
History   Scribed for Shawna Hutching, MD, the patient was seen in room APA18/APA18 . This chart was scribed by Lewanda Rife.   CSN: [id number]  Arrival date & time 03/07/12  [place]ully   First MD Initiated Contact with Patient 03/07/12 2158      Chief Complaint  Patient presents with  . Abdominal Pain    (Consider location/radiation/quality/duration/timing/severity/associated sxs/prior treatment) HPI Shawna Huerta is a 51 y.o. female who presents to the Emergency Department complaining of mild to moderate suprapubic abdominal pain for the past 10 days. Pt describes pain as "tight." Pt reports associated watery diarrhea and unusual light brown vaginal discharge. Pt denies nausea, emesis, hematochezia, melena, and pus in stool. Pt states she has 7-8 episodes of diarrhea a day. Pt reports eating and drinking fluids normally. Pt states last sexual encounter she engaged in was unprotected about 3 months ago.   G2 P1 AB1   Past Medical History  Diagnosis Date  . Diabetes mellitus     Past Surgical History  Procedure Date  . Abdominal surgery   . Tonsillectomy   . Cholecystectomy   . Abdominal hysterectomy   . Foot surgery     No family history on file.  History  Substance Use Topics  . Smoking status: Current Everyday Smoker -- 0.5 packs/day  . Smokeless tobacco: Not on file  . Alcohol Use: No    OB History    Grav Para Term Preterm Abortions TAB SAB Ect Mult Living                  Review of Systems  Constitutional: Negative.   HENT: Negative.   Respiratory: Negative.   Cardiovascular: Negative.   Gastrointestinal: Positive for abdominal pain and diarrhea. Negative for nausea and vomiting.  Genitourinary: Positive for vaginal discharge. Negative for dysuria, frequency, vaginal bleeding and difficulty urinating.  Musculoskeletal: Negative.   Skin: Negative.   Neurological: Negative.   Hematological: Negative.   Psychiatric/Behavioral: Negative.   All other systems  reviewed and are negative.    Allergies  Iohexol  Home Medications   Current Outpatient Rx  Name Route Sig Dispense Refill  . GLYBURIDE-METFORMIN 5-500 MG PO TABS Oral Take 1 tablet by mouth 2 (two) times daily.    . TETRAHYDROZOLINE HCL 0.05 % OP SOLN Both Eyes Place 1 drop into both eyes 2 (two) times a week.      BP 173/94  Pulse 82  Temp(Src) 98.1 F (36.7 C) (Oral)  Resp 20  Ht 5\' 7"  (1.702 m)  Wt 290 lb (131.543 kg)  BMI 45.42 kg/m2  SpO2 100%  Physical Exam  Nursing note and vitals reviewed. Constitutional: She is oriented to person, place, and time. She appears well-developed and well-nourished.  HENT:  Head: Normocephalic and atraumatic.  Eyes: Conjunctivae and EOM are normal. Pupils are equal, round, and reactive to light.  Neck: Normal range of motion. Neck supple.  Cardiovascular: Normal rate and regular rhythm.   Pulmonary/Chest: Effort normal and breath sounds normal.  Abdominal: Soft. Bowel sounds are normal. There is tenderness.       Mildly tender in suprapubic region   Genitourinary: Uterus normal. Cervix exhibits motion tenderness and discharge. Right adnexum displays tenderness.       Normal appearing genitalia  Copious light yellow discharge  Right adnexal mild to moderate tenderness   Cervical motion tenderness   Musculoskeletal: Normal range of motion.  Neurological: She is alert and oriented to person, place, and time.  Skin:  Skin is warm and dry.  Psychiatric: She has a normal mood and affect.    ED Course  Procedures (including critical care time) 10:18 PM Pt informed of blood work, and pelvic exam. Will follow up with pt after reviewing results.   Labs Reviewed  CBC - Abnormal; Notable for the following:    RBC 5.13 (*)    Hemoglobin 10.7 (*)    HCT 34.5 (*)    MCV 67.3 (*)    MCH 20.9 (*)    RDW 17.2 (*)    All other components within normal limits  URINALYSIS, ROUTINE W REFLEX MICROSCOPIC - Abnormal; Notable for the following:      Specific Gravity, Urine >1.030 (*)    Hgb urine dipstick SMALL (*)    Bilirubin Urine SMALL (*)    Ketones, ur 15 (*)    Protein, ur TRACE (*)    Leukocytes, UA MODERATE (*)    All other components within normal limits  WET PREP, GENITAL - Abnormal; Notable for the following:    Trich, Wet Prep FEW (*)    WBC, Wet Prep HPF POC TOO NUMEROUS TO COUNT (*)    All other components within normal limits  URINE MICROSCOPIC-ADD ON - Abnormal; Notable for the following:    Squamous Epithelial / LPF FEW (*)    Bacteria, UA MANY (*)    All other components within normal limits  DIFFERENTIAL  BASIC METABOLIC PANEL  GC/CHLAMYDIA PROBE AMP, GENITAL   No results found.   No diagnosis found.    MDM   patient is suprapubic pain, diarrhea, vaginal discharge. Pelvic exam shows cervicitis but no PID. Will treat for cervicitis, trichomonas, urinary tract infection.  Lomotil for diarrhea       I personally performed the services described in this documentation, which was scribed in my presence. The recorded information has been reviewed and considered.    Shawna Hutching, MD 03/08/12 Rich Fuchs

## 2012-03-07 NOTE — ED Notes (Signed)
Diarrhea for over a week, abdominal pain onset x 4 days

## 2012-03-08 MED ORDER — DIPHENOXYLATE-ATROPINE 2.5-0.025 MG PO TABS: 1.0000 | ORAL_TABLET | Freq: Four times a day (QID) | ORAL | Status: AC | PRN

## 2012-03-08 MED ORDER — METRONIDAZOLE 500 MG PO TABS: 500.0000 mg | ORAL_TABLET | Freq: Two times a day (BID) | ORAL | Status: AC

## 2012-03-08 MED ORDER — NITROFURANTOIN MONOHYD MACRO 100 MG PO CAPS: 100.0000 mg | ORAL_CAPSULE | Freq: Two times a day (BID) | ORAL | Status: AC

## 2012-03-08 NOTE — ED Notes (Signed)
Discharge instructions given and reviewed with patient.  Prescriptions given for Macrobid, Flagyl and Lomotil; effects and use explained for each. Patient verbalized understanding to take medications as directed and to follow-up with her PMD as needed.  Patient ambulatory; discharged home in good condition.

## 2012-03-08 NOTE — Discharge Instructions (Signed)
Cervicitis Cervicitis is a soreness and swelling (inflammation) of the cervix. Your cervix is located at the bottom of your uterus which opens up to the vagina.  CAUSES   Sexually transmitted infections (STIs).   Allergic reaction.   Medicines or birth control devices that are put in the vagina.   Injury to the cervix.   Bacterial infections.  SYMPTOMS  There may be no symptoms. If symptoms occur, they may include:  Grey, white, yellow, or bad smelling vaginal discharge.   Pain or itching of the area outside the vagina.   Painful sexual intercourse.   Lower abdominal or lower back pain, especially during intercourse.   Frequent urination.   Abnormal vaginal bleeding between periods, after sexual intercourse, or after menopause.   Pressure or a heavy feeling in the pelvis.  DIAGNOSIS  Diagnosis is made after a pelvic exam. Other tests may include:  Examination of any discharge under a microscope (wet prep).   A Pap test.  TREATMENT  Treatment will depend on the cause of cervicitis. If it is caused by an STI, both you and your partner will need to be treated. Antibiotic medicines will be given. HOME CARE INSTRUCTIONS   Do not have sexual intercourse until your caregiver says it is okay.   Do not have sexual intercourse until your partner has been treated if your cervicitis is caused by an STI.   Take your antibiotics as directed. Finish them even if you start to feel better.  SEEK IMMEDIATE MEDICAL CARE IF:   Your symptoms come back.   You have a fever.   You experience any problems that may be related to the medicine you are taking.  MAKE SURE YOU:   Understand these instructions.   Will watch your condition.   Will get help right away if you are not doing well or get worse.  Document Released: 11/14/2005 Document Revised: 11/03/2011 Document Reviewed: 06/13/2011 [facility] Patient Information 2012 [place], [place].   We are treating her for cervicitis which  is a infection surrounding her cervix. Additionally you have been treated for Trichomonas and a urinary tract infection.  2 antibiotics prescribed for one week. Followup your primary care Dr.

## 2012-03-09 LAB — GC/CHLAMYDIA PROBE AMP, GENITAL
Chlamydia, DNA Probe: NEGATIVE
GC Probe Amp, Genital: NEGATIVE

## 2012-09-23 ENCOUNTER — Emergency Department (HOSPITAL_COMMUNITY)
Admission: EM | Admit: 2012-09-23 | Discharge: 2012-09-23 | Disposition: A | Discharge status: Home / Self Care | Payer: Private Health Insurance - Indemnity | Attending: Emergency Medicine | Admitting: Emergency Medicine

## 2012-09-23 ENCOUNTER — Encounter (HOSPITAL_COMMUNITY): Payer: Self-pay | Admitting: Emergency Medicine

## 2012-09-23 DIAGNOSIS — Z9889 Other specified postprocedural states: Secondary | ICD-10-CM | POA: Insufficient documentation

## 2012-09-23 DIAGNOSIS — M25579 Pain in unspecified ankle and joints of unspecified foot: Secondary | ICD-10-CM | POA: Insufficient documentation

## 2012-09-23 DIAGNOSIS — Z79899 Other long term (current) drug therapy: Secondary | ICD-10-CM | POA: Insufficient documentation

## 2012-09-23 DIAGNOSIS — F17200 Nicotine dependence, unspecified, uncomplicated: Secondary | ICD-10-CM | POA: Insufficient documentation

## 2012-09-23 DIAGNOSIS — E119 Type 2 diabetes mellitus without complications: Secondary | ICD-10-CM | POA: Insufficient documentation

## 2012-09-23 DIAGNOSIS — M79671 Pain in right foot: Secondary | ICD-10-CM

## 2012-09-23 MED ORDER — HYDROCODONE-ACETAMINOPHEN 7.5-325 MG PO TABS: 1.0000 | ORAL_TABLET | ORAL | Status: DC | PRN

## 2012-09-23 MED ORDER — PROMETHAZINE HCL 12.5 MG PO TABS
12.5000 mg | ORAL_TABLET | Freq: Once | ORAL | Status: AC
  Administered 2012-09-23: 12.5 mg via ORAL
  Filled 2012-09-23: qty 1

## 2012-09-23 MED ORDER — KETOROLAC TROMETHAMINE 10 MG PO TABS
10.0000 mg | ORAL_TABLET | Freq: Once | ORAL | Status: AC
  Administered 2012-09-23: 10 mg via ORAL
  Filled 2012-09-23: qty 1

## 2012-09-23 MED ORDER — HYDROCODONE-ACETAMINOPHEN 5-325 MG PO TABS
2.0000 | ORAL_TABLET | Freq: Once | ORAL | Status: AC
  Administered 2012-09-23: 2 via ORAL
  Filled 2012-09-23: qty 2

## 2012-09-23 NOTE — ED Provider Notes (Signed)
Medical screening examination/treatment/procedure(s) were performed by non-physician practitioner and as supervising physician I was immediately available for consultation/collaboration.   Madisun Hargrove L Terisa Belardo, MD 09/23/12 2321 

## 2012-09-23 NOTE — ED Provider Notes (Signed)
History     CSN: [id number]  Arrival date & time 09/23/12  1842   First MD Initiated Contact with Patient 09/23/12 2135      Chief Complaint  Patient presents with  . Foot Pain    (Consider location/radiation/quality/duration/timing/severity/associated sxs/prior treatment) HPI Comments: Patient states she had major surgery on her right foot in 2012 and currently has hardware in that foot. The patient states that she stands for approximately 8 hours daily at her job. She now has pain in the right foot that has been getting progressively worse since October 17. The patient has not had a hot area of the foot, there's been no fever, there's been no injury or trauma. Patient presents to the emergency department because the over-the-counter medications she was using is no longer helping.  The history is provided by the patient.    Past Medical History  Diagnosis Date  . Diabetes mellitus     Past Surgical History  Procedure Date  . Abdominal surgery   . Tonsillectomy   . Cholecystectomy   . Abdominal hysterectomy   . Foot surgery     No family history on file.  History  Substance Use Topics  . Smoking status: Current Every Day Smoker -- 0.5 packs/day  . Smokeless tobacco: Not on file  . Alcohol Use: No    OB History    Grav Para Term Preterm Abortions TAB SAB Ect Mult Living                  Review of Systems  Constitutional: Negative for activity change.       All ROS Neg except as noted in HPI  HENT: Negative for nosebleeds and neck pain.   Eyes: Negative for photophobia and discharge.  Respiratory: Negative for cough, shortness of breath and wheezing.   Cardiovascular: Negative for chest pain and palpitations.  Gastrointestinal: Negative for abdominal pain and blood in stool.  Genitourinary: Negative for dysuria, frequency and hematuria.  Musculoskeletal: Negative for back pain and arthralgias.       Foot pain  Skin: Negative.   Neurological: Negative for  dizziness, seizures and speech difficulty.  Psychiatric/Behavioral: Negative for hallucinations and confusion.    Allergies  Iohexol  Home Medications   Current Outpatient Rx  Name Route Sig Dispense Refill  . ACETAMINOPHEN 500 MG PO TABS Oral Take 500 mg by mouth every 6 (six) hours as needed. pain    . GLYBURIDE-METFORMIN 5-500 MG PO TABS Oral Take 1 tablet by mouth 2 (two) times daily.    . TETRAHYDROZOLINE HCL 0.05 % OP SOLN Both Eyes Place 1 drop into both eyes daily.     Marland Kitchen HYDROCODONE-ACETAMINOPHEN 7.5-325 MG PO TABS Oral Take 1 tablet by mouth every 4 (four) hours as needed for pain. 20 tablet 0    BP 128/78  Pulse 75  Temp 98.6 F (37 C)  Resp 18  Ht 5\' 7"  (1.702 m)  Wt 290 lb (131.543 kg)  BMI 45.42 kg/m2  SpO2 100%  Physical Exam  Nursing note and vitals reviewed. Constitutional: She is oriented to person, place, and time. She appears well-developed and well-nourished.  Non-toxic appearance.  HENT:  Head: Normocephalic.  Right Ear: Tympanic membrane and external ear normal.  Left Ear: Tympanic membrane and external ear normal.  Eyes: EOM and lids are normal. Pupils are equal, round, and reactive to light.  Neck: Normal range of motion. Neck supple. Carotid bruit is not present.  Cardiovascular: Normal rate, regular rhythm,  normal heart sounds, intact distal pulses and normal pulses.   Pulmonary/Chest: Breath sounds normal. No respiratory distress.  Abdominal: Soft. Bowel sounds are normal. There is no tenderness. There is no guarding.  Musculoskeletal: Normal range of motion.       There are well-healed surgical scars of the right foot. There is full range of motion of the toes of the right foot. There no lesions between the toes. There is no puncture wounds noted of the plantar surface of the right foot. The right foot is not hot. There is pain however with any movement of the foot. There is no red streaking going up the anterior lower leg. There is a negative  Homans sign. And there is no difference in the size of the right calf and the left calf.  Lymphadenopathy:       Head (right side): No submandibular adenopathy present.       Head (left side): No submandibular adenopathy present.    She has no cervical adenopathy.  Neurological: She is alert and oriented to person, place, and time. She has normal strength. No cranial nerve deficit or sensory deficit.  Skin: Skin is warm and dry.  Psychiatric: She has a normal mood and affect. Her speech is normal.    ED Course  Procedures (including critical care time)  Labs Reviewed - No data to display No results found.   1. Foot pain, right       MDM  I have reviewed nursing notes, vital signs, and all appropriate lab and imaging results for this patient. There is no evidence of infection. There is no evidence of trauma. There no changes in the vital signs at this time. Suspect that the patient is having an inflammatory attack related to placement of hardware in the right foot as well as standing for 8 hours a day on the patient's job. Patient treated with Norco 7.5 mg #20 tablets and advised to see her physician at [facility] orthopedics for formal evaluation of the right foot.       Kathie Dike, [place] 09/23/12 2249

## 2012-09-23 NOTE — ED Notes (Signed)
Pt c/o right foot pain since thursday

## 2012-09-23 NOTE — ED Notes (Signed)
Patient with no complaints at this time. Respirations even and unlabored. Skin warm/dry. Discharge instructions reviewed with patient at this time. Patient given opportunity to voice concerns/ask questions. Patient discharged at this time and left Emergency Department with steady gait.   

## 2012-09-27 ENCOUNTER — Telehealth: Payer: Self-pay | Admitting: Orthopedic Surgery

## 2012-09-27 NOTE — Telephone Encounter (Signed)
Patient had called, left message 09/26/12, requesting appointment for right foot pain. Returned call 09/27/12, following review of previous office note in EMR [employer] system from 2011 visit, and Dr. Romeo Apple had referred patient to orthopedic foot and ankle surgeon, Dr. Lestine Box.  Patient relayed when I called that she had contacted Dr. Lestine Box' office, and has been scheduled for appointment there next week.

## 2013-07-04 ENCOUNTER — Other Ambulatory Visit (HOSPITAL_COMMUNITY): Payer: Self-pay | Admitting: Pulmonary Disease

## 2013-07-04 DIAGNOSIS — Z139 Encounter for screening, unspecified: Secondary | ICD-10-CM

## 2013-07-09 ENCOUNTER — Ambulatory Visit (HOSPITAL_COMMUNITY): Payer: Private Health Insurance - Indemnity

## 2013-08-05 ENCOUNTER — Emergency Department (HOSPITAL_COMMUNITY)
Admission: EM | Admit: 2013-08-05 | Discharge: 2013-08-05 | Disposition: A | Discharge status: Home / Self Care | Payer: Private Health Insurance - Indemnity | Attending: Emergency Medicine | Admitting: Emergency Medicine

## 2013-08-05 ENCOUNTER — Emergency Department (HOSPITAL_COMMUNITY): Payer: Private Health Insurance - Indemnity

## 2013-08-05 ENCOUNTER — Encounter (HOSPITAL_COMMUNITY): Payer: Self-pay | Admitting: *Deleted

## 2013-08-05 DIAGNOSIS — E119 Type 2 diabetes mellitus without complications: Secondary | ICD-10-CM | POA: Insufficient documentation

## 2013-08-05 DIAGNOSIS — L0231 Cutaneous abscess of buttock: Secondary | ICD-10-CM | POA: Insufficient documentation

## 2013-08-05 DIAGNOSIS — Z79899 Other long term (current) drug therapy: Secondary | ICD-10-CM | POA: Insufficient documentation

## 2013-08-05 DIAGNOSIS — F17200 Nicotine dependence, unspecified, uncomplicated: Secondary | ICD-10-CM | POA: Insufficient documentation

## 2013-08-05 DIAGNOSIS — N751 Abscess of Bartholin's gland: Secondary | ICD-10-CM | POA: Insufficient documentation

## 2013-08-05 LAB — POCT I-STAT, CHEM 8
BUN: 9 mg/dL (ref 6–23)
Creatinine, Ser: 0.7 mg/dL (ref 0.50–1.10)
Glucose, Bld: 112 mg/dL — ABNORMAL HIGH (ref 70–99)
Sodium: 140 mEq/L (ref 135–145)
TCO2: 24 mmol/L (ref 0–100)

## 2013-08-05 MED ORDER — HYDROCODONE-ACETAMINOPHEN 5-325 MG PO TABS
2.0000 | ORAL_TABLET | Freq: Once | ORAL | Status: AC
  Administered 2013-08-05: 2 via ORAL
  Filled 2013-08-05: qty 2

## 2013-08-05 MED ORDER — CEPHALEXIN 500 MG PO CAPS: 500.0000 mg | ORAL_CAPSULE | Freq: Four times a day (QID) | ORAL | Status: DC

## 2013-08-05 MED ORDER — CEPHALEXIN 500 MG PO CAPS
500.0000 mg | ORAL_CAPSULE | Freq: Once | ORAL | Status: AC
  Administered 2013-08-05: 500 mg via ORAL
  Filled 2013-08-05: qty 1

## 2013-08-05 MED ORDER — DOXYCYCLINE HYCLATE 100 MG PO CAPS: 100.0000 mg | ORAL_CAPSULE | Freq: Two times a day (BID) | ORAL | Status: DC

## 2013-08-05 MED ORDER — DOXYCYCLINE HYCLATE 100 MG PO TABS
100.0000 mg | ORAL_TABLET | Freq: Once | ORAL | Status: AC
  Administered 2013-08-05: 100 mg via ORAL
  Filled 2013-08-05: qty 1

## 2013-08-05 MED ORDER — LIDOCAINE-EPINEPHRINE (PF) 2 %-1:200000 IJ SOLN
INTRAMUSCULAR | Status: AC
  Administered 2013-08-05: 22:00:00
  Filled 2013-08-05: qty 20

## 2013-08-05 MED ORDER — MORPHINE SULFATE 4 MG/ML IJ SOLN: 4.0000 mg | Freq: Once | INTRAMUSCULAR | Status: DC

## 2013-08-05 NOTE — ED Notes (Signed)
Pt reports abscess to her buttocks. Noticed on Saturday and has gotten worse. Pt states has had in the past but not in this location.

## 2013-08-05 NOTE — ED Provider Notes (Signed)
CSN: [id number]     Arrival date & time 08/05/13  1924 History   First MD Initiated Contact with Patient 08/05/13 2011     Chief Complaint  Patient presents with  . Abscess   (Consider location/radiation/quality/duration/timing/severity/associated sxs/prior Treatment) Patient is a 52 y.o. female presenting with abscess. The history is provided by the patient.  Abscess Location:  Ano-genital Ano-genital abscess location:  R buttock Size:  3 cm Abscess quality: fluctuance, induration, painful and warmth   Red streaking: yes   Progression:  Worsening Pain details:    Quality:  Sharp   Severity:  Moderate   Duration:  2 days   Timing:  Constant   Progression:  Worsening Chronicity:  Recurrent Context: diabetes   Context: not immunosuppression, not injected drug use, not insect bite/sting and not skin injury   Relieved by:  Nothing Worsened by:  Nothing tried Ineffective treatments:  None tried Associated symptoms: no anorexia, no fatigue, no fever, no headaches, no nausea and no vomiting   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 Diabetes mellitus    Past Surgical History  Procedure Laterality Date  . Abdominal surgery    . Tonsillectomy    . Cholecystectomy    . Abdominal hysterectomy    . Foot surgery    . Foot surgery  2011   No family history on file. History  Substance Use Topics  . Smoking status: Current Every Day Smoker -- 0.50 packs/day  . Smokeless tobacco: Not on file  . Alcohol Use: No   OB History   Grav Para Term Preterm Abortions TAB SAB Ect Mult Living                 Review of Systems  Constitutional: Negative for fever and fatigue.  Gastrointestinal: Negative for nausea, vomiting and anorexia.  Neurological: Negative for headaches.  All other systems reviewed and are negative.    Allergies  Iohexol  Home Medications   Current Outpatient Rx  Name  Route  Sig  Dispense  Refill  . acetaminophen (TYLENOL) 500  MG tablet   Oral   Take 500 mg by mouth every 6 (six) hours as needed. pain         . glyBURIDE-metformin (GLUCOVANCE) 5-500 MG per tablet   Oral   Take 1 tablet by mouth 2 (two) times daily.         Marland Kitchen HYDROcodone-acetaminophen (NORCO) 7.5-325 MG per tablet   Oral   Take 1 tablet by mouth every 4 (four) hours as needed for pain.   20 tablet   0   . tetrahydrozoline (VISINE) 0.05 % ophthalmic solution   Both Eyes   Place 1 drop into both eyes daily.           BP 150/72  Pulse 93  Temp(Src) 98.5 F (36.9 C) (Oral)  Resp 20  Ht 5\' 8"  (1.727 m)  Wt 262 lb (118.842 kg)  BMI 39.85 kg/m2  SpO2 100% Physical Exam  Nursing note and vitals reviewed. Constitutional: She is oriented to person, place, and time. She appears well-developed and well-nourished. No distress.  HENT:  Head: Normocephalic and atraumatic.  Eyes: EOM are normal. Pupils are equal, round, and reactive to light.  Neck: Normal range of motion. Neck supple.  Cardiovascular: Normal rate and regular rhythm.  Exam reveals no friction rub.   No murmur heard. Pulmonary/Chest: Effort normal and breath sounds normal. No respiratory distress. She  has no wheezes. She has no rales.  Abdominal: Soft. She exhibits no distension. There is no tenderness. There is no rebound.  Genitourinary:     Musculoskeletal: Normal range of motion. She exhibits no edema.  Neurological: She is alert and oriented to person, place, and time.  Skin: She is not diaphoretic.    ED Course  INCISION AND DRAINAGE Date/Time: 08/05/2013 10:13 PM Performed by: Dagmar Hait Authorized by: Dagmar Hait Consent: Verbal consent obtained. Type: abscess Body area: anogenital Location details: Bartholin's gland Anesthesia: local infiltration Local anesthetic: lidocaine 2% with epinephrine Anesthetic total: 2 ml Patient sedated: no Scalpel size: 11 Incision type: single straight Complexity: simple Drainage: bloody and  purulent Drainage amount: copious Wound treatment: drain placed Patient tolerance: Patient tolerated the procedure well with no immediate complications. Comments: Word catheter placed. Copious black discharge.   INCISION AND DRAINAGE Date/Time: 08/05/2013 10:14 PM Performed by: Dagmar Hait Authorized by: Dagmar Hait Type: abscess Body area: anogenital Location details: gluteal cleft Anesthesia: local infiltration Local anesthetic: lidocaine 2% with epinephrine Anesthetic total: 6 ml Scalpel size: 11 Incision type: elliptical Complexity: simple Drainage: purulent Drainage amount: moderate Wound treatment: wound left open Packing material: 1/2 in iodoform gauze Patient tolerance: Patient tolerated the procedure well with no immediate complications.   (including critical care time) Labs Review Labs Reviewed - No data to display Imaging Review Ct Abdomen Pelvis Wo Contrast  08/05/2013   *RADIOLOGY REPORT*  Clinical Data: Evaluate for possible Bartholin gland cyst versus buttock abscess.  CT ABDOMEN AND PELVIS WITHOUT CONTRAST  Technique:  Multidetector CT imaging of the abdomen and pelvis was performed following the standard protocol without intravenous contrast.  Comparison: CT of the abdomen and pelvis 07/23/2002.  Comment:  The study is limited for assessment of visceral, vascular and infectious abnormalities by lack of IV contrast administration.  Findings:  Pelvis:  Adjacent to the introitus on the right side there is a area of soft tissue prominence measuring approximately 4.9 x 3.5 cm which is low-intermediate attenuation (approximately 25 HU), which is favored to represent a Bartholin gland cyst or abscess.  No other acute abnormality is noted.  Specifically, no abscess in the buttocks region is identified.  Within the visualized pelvis there is no significant volume of ascites, no pneumoperitoneum and no pathologic distension of small bowel.  Status post  hysterectomy. Ovaries appear atrophic.  Musculoskeletal: There are no aggressive appearing lytic or blastic lesions noted in the visualized portions of the skeleton.  IMPRESSION: 1.  Findings, as above, suggesting Bartholin gland cyst. Correlation with physical examination is recommended. 2.  No definite buttocks abscess identified.   Original Report Authenticated By: Trudie Reed, M.D.    MDM   1. Bartholin's gland abscess   2. Abscess of buttock, right    52 year old female presents with a right buttock abscess. Present for 2 days. History of abscesses, however no history of buttock abscess. No systemic symptoms. Patient has buttock abscess on the right medial buttock. She also has swelling of the right labia with some pain about: Gland cyst area. We'll CT her pelvis to see if these to connect via a fistulous tract. CT without fistulous tract. Bartholin's gland drained with copious black discharge. Buttock abscess drained with yellow discharge. Patient placed on antibiotics - doxycycline and keflex to cover enterics and STD etiologies. Stable for discharge.     Dagmar Hait, MD 08/05/13 2219

## 2013-08-07 ENCOUNTER — Encounter (HOSPITAL_COMMUNITY): Payer: Self-pay | Admitting: Emergency Medicine

## 2013-08-07 ENCOUNTER — Emergency Department (HOSPITAL_COMMUNITY)
Admission: EM | Admit: 2013-08-07 | Discharge: 2013-08-07 | Disposition: A | Discharge status: Home / Self Care | Payer: Managed Care, Other (non HMO) | Attending: Emergency Medicine | Admitting: Emergency Medicine

## 2013-08-07 DIAGNOSIS — L0231 Cutaneous abscess of buttock: Secondary | ICD-10-CM

## 2013-08-07 DIAGNOSIS — F17200 Nicotine dependence, unspecified, uncomplicated: Secondary | ICD-10-CM | POA: Insufficient documentation

## 2013-08-07 DIAGNOSIS — N751 Abscess of Bartholin's gland: Secondary | ICD-10-CM

## 2013-08-07 DIAGNOSIS — Z79899 Other long term (current) drug therapy: Secondary | ICD-10-CM | POA: Insufficient documentation

## 2013-08-07 DIAGNOSIS — E119 Type 2 diabetes mellitus without complications: Secondary | ICD-10-CM | POA: Insufficient documentation

## 2013-08-07 MED ORDER — HYDROCODONE-ACETAMINOPHEN 5-325 MG PO TABS: 1.0000 | ORAL_TABLET | ORAL | Status: DC | PRN

## 2013-08-07 NOTE — ED Notes (Signed)
Pt here for removal of drain and packing.

## 2013-08-07 NOTE — ED Notes (Signed)
Pt seen here on Monday for abscess of Bartholin"s gland and buttock.

## 2013-08-08 ENCOUNTER — Other Ambulatory Visit (HOSPITAL_COMMUNITY): Payer: Self-pay | Admitting: Emergency Medicine

## 2013-08-09 ENCOUNTER — Other Ambulatory Visit (HOSPITAL_COMMUNITY): Payer: Self-pay | Admitting: Emergency Medicine

## 2013-08-09 DIAGNOSIS — L0591 Pilonidal cyst without abscess: Secondary | ICD-10-CM

## 2013-08-12 NOTE — ED Provider Notes (Signed)
CSN: [id number]     Arrival date & time 08/07/13  1114 History   First MD Initiated Contact with Patient 08/07/13 1136     Chief Complaint  Patient presents with  . Wound Check   (Consider location/radiation/quality/duration/timing/severity/associated sxs/prior Treatment) HPI Comments: Shawna Huerta is a 52 y.o. Female presenting for removal of her word catheter and her packing from the 2 abscess she had drained here 2 days ago, both of her right labia and her right buttock.  She reports improvement in pain and swelling since the I &D's occurred and she is taking keflex and doxycycline as prescribed.  She denies fevers, chills, nausea or vomiting.  She has changed the dressings daily without problem.     The history is provided by the patient.    Past Medical History  Diagnosis Date  . Diabetes mellitus    Past Surgical History  Procedure Laterality Date  . Abdominal surgery    . Tonsillectomy    . Cholecystectomy    . Abdominal hysterectomy    . Foot surgery    . Foot surgery  2011   Family History  Problem Relation Age of Onset  . Diabetes Mother   . Stroke Mother   . Cancer Father   . Diabetes Sister   . Cancer Brother   . Diabetes Brother    History  Substance Use Topics  . Smoking status: Current Every Day Smoker -- 0.50 packs/day  . Smokeless tobacco: Not on file  . Alcohol Use: No   OB History   Grav Para Term Preterm Abortions TAB SAB Ect Mult Living   2 1 1  1  1   1      Review of Systems  Constitutional: Negative for fever and chills.  HENT: Negative for facial swelling.   Respiratory: Negative for shortness of breath and wheezing.   Skin: Positive for wound.  Neurological: Negative for numbness.    Allergies  Iohexol  Home Medications   Current Outpatient Rx  Name  Route  Sig  Dispense  Refill  . cephALEXin (KEFLEX) 500 MG capsule   Oral   Take 1 capsule (500 mg total) by mouth 4 (four) times daily.   40 capsule   0   . doxycycline  (VIBRAMYCIN) 100 MG capsule   Oral   Take 1 capsule (100 mg total) by mouth 2 (two) times daily.   20 capsule   0   . glyBURIDE-metformin (GLUCOVANCE) 5-500 MG per tablet   Oral   Take 1 tablet by mouth 2 (two) times daily.         Marland Kitchen HYDROcodone-acetaminophen (NORCO/VICODIN) 5-325 MG per tablet   Oral   Take 1 tablet by mouth every 4 (four) hours as needed for pain.   15 tablet   0    BP 135/61  Pulse 77  Temp(Src) 98.3 F (36.8 C) (Oral)  Resp 14  Ht 5\' 8"  (1.727 m)  Wt 262 lb (118.842 kg)  BMI 39.85 kg/m2  SpO2 100% Physical Exam  Constitutional: She is oriented to person, place, and time. She appears well-developed and well-nourished.  HENT:  Head: Normocephalic.  Cardiovascular: Normal rate.   Pulmonary/Chest: Effort normal.  Musculoskeletal: Normal range of motion.  Neurological: She is alert and oriented to person, place, and time. No sensory deficit.  Skin:  Word catheter in place right posterior labia.  No packing remaining in buttock abscess which appears healthy, no active drainage but is open,  No surrounding cellulitis.  ED Course  Procedures (including critical care time)   Word catheter deflated and removed without discomfort.  No continued drainage,  Area is slightly tender, still with induration, better per patient report. Labs Review Labs Reviewed - No data to display Imaging Review No results found.  MDM   1. Abscess of buttock   2. Bartholin's gland abscess    Pt was encouraged to compete abx.  Start warm epsom salt soaks.  Recheck here or by pcp for worsened condition.    Burgess Amor, PA-C 08/12/13 1243

## 2013-08-15 NOTE — ED Provider Notes (Signed)
Medical screening examination/treatment/procedure(s) were performed by non-physician practitioner and as supervising physician I was immediately available for consultation/collaboration. Mahari Strahm, MD, FACEP   Jennell Janosik L Ethylene Reznick, MD 08/15/13 0843 

## 2013-09-03 ENCOUNTER — Ambulatory Visit (INDEPENDENT_AMBULATORY_CARE_PROVIDER_SITE_OTHER): Payer: Managed Care, Other (non HMO) | Admitting: Adult Health

## 2013-09-03 ENCOUNTER — Encounter: Payer: Self-pay | Admitting: Adult Health

## 2013-09-03 VITALS — BP 122/80 | HR 78 | Ht 68.0 in | Wt 262.0 lb

## 2013-09-03 DIAGNOSIS — F17200 Nicotine dependence, unspecified, uncomplicated: Secondary | ICD-10-CM | POA: Insufficient documentation

## 2013-09-03 DIAGNOSIS — E1165 Type 2 diabetes mellitus with hyperglycemia: Secondary | ICD-10-CM | POA: Insufficient documentation

## 2013-09-03 DIAGNOSIS — Z139 Encounter for screening, unspecified: Secondary | ICD-10-CM

## 2013-09-03 DIAGNOSIS — E669 Obesity, unspecified: Secondary | ICD-10-CM

## 2013-09-03 DIAGNOSIS — N898 Other specified noninflammatory disorders of vagina: Secondary | ICD-10-CM

## 2013-09-03 DIAGNOSIS — Z1212 Encounter for screening for malignant neoplasm of rectum: Secondary | ICD-10-CM

## 2013-09-03 DIAGNOSIS — E119 Type 2 diabetes mellitus without complications: Secondary | ICD-10-CM

## 2013-09-03 DIAGNOSIS — Z01419 Encounter for gynecological examination (general) (routine) without abnormal findings: Secondary | ICD-10-CM

## 2013-09-03 HISTORY — DX: Other specified noninflammatory disorders of vagina: N89.8

## 2013-09-03 HISTORY — DX: Nicotine dependence, unspecified, uncomplicated: F17.200

## 2013-09-03 LAB — HEMOCCULT GUIAC POC 1CARD (OFFICE)

## 2013-09-03 LAB — POCT WET PREP (WET MOUNT)

## 2013-09-03 NOTE — Patient Instructions (Addendum)
Physical in 1 year Mammogram now and yearly Get flu shot at work Colonoscopy  Needed referral to [facility] with Dr Juanetta Gosling Calorie Counting Diet A calorie counting diet requires you to eat the number of calories that are right for you in a day. Calories are the measurement of how much energy you get from the food you eat. Eating the right amount of calories is important for staying at a healthy weight. If you eat too many calories, your body will store them as fat and you may gain weight. If you eat too few calories, you may lose weight. Counting the number of calories you eat during a day will help you know if you are eating the right amount. A Registered Dietitian can determine how many calories you need in a day. The amount of calories needed varies from person to person. If your goal is to lose weight, you will need to eat fewer calories. Losing weight can benefit you if you are overweight or have health problems such as heart disease, high blood pressure, or diabetes. If your goal is to gain weight, you will need to eat more calories. Gaining weight may be necessary if you have a certain health problem that causes your body to need more energy. TIPS Whether you are increasing or decreasing the number of calories you eat during a day, it may be hard to get used to changes in what you eat and drink. The following are tips to help you keep track of the number of calories you eat.  Measure foods at home with measuring cups. This helps you know the amount of food and number of calories you are eating.  Restaurants often serve food in amounts that are larger than 1 serving. While eating out, estimate how many servings of a food you are given. For example, a serving of cooked rice is  cup or about the size of half of a fist. Knowing serving sizes will help you be aware of how much food you are eating at restaurants.  Ask for smaller portion sizes or child-size portions at restaurants.  Plan to  eat half of a meal at a restaurant. Take the rest home or share the other half with a friend.  Read the Nutrition Facts panel on food labels for calorie content and serving size. You can find out how many servings are in a package, the size of a serving, and the number of calories each serving has.  For example, a package might contain 3 cookies. The Nutrition Facts panel on that package says that 1 serving is 1 cookie. Below that, it will say there are 3 servings in the container. The calories section of the Nutrition Facts label says there are 90 calories. This means there are 90 calories in 1 cookie (1 serving). If you eat 1 cookie you have eaten 90 calories. If you eat all 3 cookies, you have eaten 270 calories (3 servings x 90 calories = 270 calories). The list below tells you how big or small some common portion sizes are.  1 oz.........4 stacked dice.  3 oz........Marland KitchenDeck of cards.  1 tsp.......Marland KitchenTip of little finger.  1 tbs......Marland KitchenMarland KitchenThumb.  2 tbs.......Marland KitchenGolf ball.   cup......Marland KitchenHalf of a fist.  1 cup.......Marland KitchenA fist. KEEP A FOOD LOG Write down every food item you eat, the amount you eat, and the number of calories in each food you eat during the day. At the end of the day, you can add up the total number  of calories you have eaten. It may help to keep a list like the one below. Find out the calorie information by reading the Nutrition Facts panel on food labels. Breakfast  Bran cereal (1 cup, 110 calories).  Fat-free milk ( cup, 45 calories). Snack  Apple (1 medium, 80 calories). Lunch  Spinach (1 cup, 20 calories).  Tomato ( medium, 20 calories).  Chicken breast strips (3 oz, 165 calories).  Shredded cheddar cheese ( cup, 110 calories).  Light [place] dressing (2 tbs, 60 calories).  Whole-wheat bread (1 slice, 80 calories).  Tub margarine (1 tsp, 35 calories).  Vegetable soup (1 cup, 160 calories). Dinner  Pork chop (3 oz, 190 calories).  Brown rice (1 cup, 215  calories).  Steamed broccoli ( cup, 20 calories).  Strawberries (1  cup, 65 calories).  Whipped cream (1 tbs, 50 calories). Daily Calorie Total: 1425 Document Released: 11/14/2005 Document Revised: 02/06/2012 Document Reviewed: 05/11/2007 [facility] Patient Information 2014 [place], [place]. Decrease carbsSmoking Cessation Quitting smoking is important to your health and has many advantages. However, it is not always easy to quit since nicotine is a very addictive drug. Often times, people try 3 times or more before being able to quit. This document explains the best ways for you to prepare to quit smoking. Quitting takes hard work and a lot of effort, but you can do it. ADVANTAGES OF QUITTING SMOKING  You will live longer, feel better, and live better.  Your body will feel the impact of quitting smoking almost immediately.  Within 20 minutes, blood pressure decreases. Your pulse returns to its normal level.  After 8 hours, carbon monoxide levels in the blood return to normal. Your oxygen level increases.  After 24 hours, the chance of having a heart attack starts to decrease. Your breath, hair, and body stop smelling like smoke.  After 48 hours, damaged nerve endings begin to recover. Your sense of taste and smell improve.  After 72 hours, the body is virtually free of nicotine. Your bronchial tubes relax and breathing becomes easier.  After 2 to 12 weeks, lungs can hold more air. Exercise becomes easier and circulation improves.  The risk of having a heart attack, stroke, cancer, or lung disease is greatly reduced.  After 1 year, the risk of coronary heart disease is cut in half.  After 5 years, the risk of stroke falls to the same as a nonsmoker.  After 10 years, the risk of lung cancer is cut in half and the risk of other cancers decreases significantly.  After 15 years, the risk of coronary heart disease drops, usually to the level of a nonsmoker.  If you are pregnant,  quitting smoking will improve your chances of having a healthy baby.  The people you live with, especially any children, will be healthier.  You will have extra money to spend on things other than cigarettes. QUESTIONS TO THINK ABOUT BEFORE ATTEMPTING TO QUIT You may want to talk about your answers with your caregiver.  Why do you want to quit?  If you tried to quit in the past, what helped and what did not?  What will be the most difficult situations for you after you quit? How will you plan to handle them?  Who can help you through the tough times? Your family? Friends? A caregiver?  What pleasures do you get from smoking? What ways can you still get pleasure if you quit? Here are some questions to ask your caregiver:  How can you help  me to be successful at quitting?  What medicine do you think would be best for me and how should I take it?  What should I do if I need more help?  What is smoking withdrawal like? How can I get information on withdrawal? GET READY  Set a quit date.  Change your environment by getting rid of all cigarettes, ashtrays, matches, and lighters in your home, car, or work. Do not let people smoke in your home.  Review your past attempts to quit. Think about what worked and what did not. GET SUPPORT AND ENCOURAGEMENT You have a better chance of being successful if you have help. You can get support in many ways.  Tell your family, friends, and co-workers that you are going to quit and need their support. Ask them not to smoke around you.  Get individual, group, or telephone counseling and support. Programs are available at [employer] and health centers. Call your local health department for information about programs in your area.  Spiritual beliefs and practices may help some smokers quit.  Download a "quit meter" on your computer to keep track of quit statistics, such as how long you have gone without smoking, cigarettes not smoked, and money  saved.  Get a self-help book about quitting smoking and staying off of tobacco. LEARN NEW SKILLS AND BEHAVIORS  Distract yourself from urges to smoke. Talk to someone, go for a walk, or occupy your time with a task.  Change your normal routine. Take a different route to work. Drink tea instead of coffee. Eat breakfast in a different place.  Reduce your stress. Take a hot bath, exercise, or read a book.  Plan something enjoyable to do every day. Reward yourself for not smoking.  Explore interactive web-based programs that specialize in helping you quit. GET MEDICINE AND USE IT CORRECTLY Medicines can help you stop smoking and decrease the urge to smoke. Combining medicine with the above behavioral methods and support can greatly increase your chances of successfully quitting smoking.  Nicotine replacement therapy helps deliver nicotine to your body without the negative effects and risks of smoking. Nicotine replacement therapy includes nicotine gum, lozenges, inhalers, nasal sprays, and skin patches. Some may be available over-the-counter and others require a prescription.  Antidepressant medicine helps people abstain from smoking, but how this works is unknown. This medicine is available by prescription.  Nicotinic receptor partial agonist medicine simulates the effect of nicotine in your brain. This medicine is available by prescription. Ask your caregiver for advice about which medicines to use and how to use them based on your health history. Your caregiver will tell you what side effects to look out for if you choose to be on a medicine or therapy. Carefully read the information on the package. Do not use any other product containing nicotine while using a nicotine replacement product.  RELAPSE OR DIFFICULT SITUATIONS Most relapses occur within the first 3 months after quitting. Do not be discouraged if you start smoking again. Remember, most people try several times before finally  quitting. You may have symptoms of withdrawal because your body is used to nicotine. You may crave cigarettes, be irritable, feel very hungry, cough often, get headaches, or have difficulty concentrating. The withdrawal symptoms are only temporary. They are strongest when you first quit, but they will go away within 10 14 days. To reduce the chances of relapse, try to:  Avoid drinking alcohol. Drinking lowers your chances of successfully quitting.  Reduce the amount  of caffeine you consume. Once you quit smoking, the amount of caffeine in your body increases and can give you symptoms, such as a rapid heartbeat, sweating, and anxiety.  Avoid smokers because they can make you want to smoke.  Do not let weight gain distract you. Many smokers will gain weight when they quit, usually less than 10 pounds. Eat a healthy diet and stay active. You can always lose the weight gained after you quit.  Find ways to improve your mood other than smoking. FOR MORE INFORMATION  www.smokefree.gov  Document Released: 11/08/2001 Document Revised: 05/15/2012 Document Reviewed: 02/23/2012 [facility] Patient Information 2014 [place], [place].

## 2013-09-03 NOTE — Progress Notes (Signed)
Patient ID: Shawna Huerta, female   DOB: 1961/11/17, 52 y.o.   MRN: [id number] History of Present Illness: Nuria is a 52 year old black female, separated, in for physical.She complains of vaginal itch.She had a recent bartholin cyst I&D and was on antibiotics.   Current Medications, Allergies, Past Medical History, Past Surgical History, Family History and Social History were reviewed in [employer] record.     Review of Systems: Patient denies any headaches, blurred vision, shortness of breath, chest pain, abdominal pain, problems with bowel movements, urination, or intercourse. No joint pain or mood swings.    Physical Exam:BP 122/80  Pulse 78  Ht 5\' 8"  (1.727 m)  Wt 262 lb (118.842 kg)  BMI 39.85 kg/m2 General:  Well developed, well nourished, no acute distress Skin:  Warm and dry Neck:  Midline trachea, normal thyroid Lungs; Clear to auscultation bilaterally Breast:  No dominant palpable mass, retraction, or nipple discharge Cardiovascular: Regular rate and rhythm Abdomen:  Soft, non tender, no hepatosplenomegaly Pelvic:  External genitalia is normal in appearance.  The vagina is normal in appearance. The cervix and uterus are absent.  No adnexal masses or tenderness noted.wet prep negative. Rectal: Good sphincter tone, no polyps, or hemorrhoids felt.  Hemoccult negative. Extremities:  No swelling or varicosities noted Psych:  No mood changes, alert and cooperative seems happy She would like to lose weight and cut down or quit smoking by first of next year.Discussed cutting carbs and decrease smoking   Impression: Yearly exam no pap Vaginal itch Obesity Diabetes  Nicotine addiction    Plan: Physical in 1 year Mammogram now and yearly Labs with Dr Juanetta Gosling Get flu shot at work Colonoscopy advised and referral made to Dr Darrick Penna Try monistat if itch persists Review handout on weight loss and smoking cessation

## 2013-10-03 ENCOUNTER — Telehealth: Payer: Self-pay

## 2013-10-03 NOTE — Telephone Encounter (Signed)
PT was referred by Cyril Mourning for screening colonoscopy. LMOM to call.

## 2013-11-05 NOTE — Telephone Encounter (Signed)
Pt has not returned call or responded to letter that was mailed

## 2014-07-03 ENCOUNTER — Ambulatory Visit (INDEPENDENT_AMBULATORY_CARE_PROVIDER_SITE_OTHER): Payer: Managed Care, Other (non HMO) | Admitting: Adult Health

## 2014-07-03 ENCOUNTER — Encounter: Payer: Self-pay | Admitting: Adult Health

## 2014-07-03 VITALS — BP 122/70 | Ht 68.0 in | Wt 259.0 lb

## 2014-07-03 DIAGNOSIS — N750 Cyst of Bartholin's gland: Secondary | ICD-10-CM | POA: Insufficient documentation

## 2014-07-03 HISTORY — DX: Cyst of Bartholin's gland: N75.0

## 2014-07-03 MED ORDER — FLUCONAZOLE 150 MG PO TABS: ORAL_TABLET | ORAL | Status: DC

## 2014-07-03 MED ORDER — HYDROCODONE-ACETAMINOPHEN 5-325 MG PO TABS: 1.0000 | ORAL_TABLET | Freq: Four times a day (QID) | ORAL | Status: DC | PRN

## 2014-07-03 MED ORDER — CIPROFLOXACIN HCL 500 MG PO TABS: 500.0000 mg | ORAL_TABLET | Freq: Two times a day (BID) | ORAL | Status: DC

## 2014-07-03 MED ORDER — METRONIDAZOLE 500 MG PO TABS: 500.0000 mg | ORAL_TABLET | Freq: Two times a day (BID) | ORAL | Status: DC

## 2014-07-03 NOTE — Progress Notes (Signed)
Subjective:     Patient ID: Shawna Huerta, female   DOB: 04/19/1961, 53 y.o.   MRN: [id number]  HPI Shawna Huerta is a 53 year old black female in complaining of Lump right labia that has been there for 2 weeks and is getting bigger and itches and hurts.She is diabetic.  Review of Systems See HPI Reviewed past medical,surgical, social and family history. Reviewed medications and allergies.     Objective:   Physical Exam BP 122/70  Ht 5\' 8"  (1.727 m)  Wt 259 lb (117.482 kg)  BMI 39.39 kg/m2   Has bartholin cyst on right about size of golf ball, not draining, tender,discussed with Dr Elonda Husky and he suggests antibiotics then I&D in 4 days  Assessment:     Bartholin cyst    Plan:     Rx cipro 500 mg 1 bid x 10 days Rx flagyl 500 mg 1 bid x 10 days Rx diflucan 150 mg #2 1 now and 1 in 3 days if needed with 1 refill Rx norco 5-325 mg #30 1 every 6 hours prn pain no refills Return in 4 days for ? I&D with Dr Elonda Husky Review handout on bartholin cyst Can use warm compresses

## 2014-07-03 NOTE — Patient Instructions (Signed)
Bartholin's Cyst or Abscess Bartholin's glands are small glands located within the folds of skin (labia) along the sides of the lower opening of the vagina (birth canal). A cyst may develop when the duct of the gland becomes blocked. When this happens, fluid that accumulates within the cyst can become infected. This is known as an abscess. The Bartholin gland produces a mucous fluid to lubricate the outside of the vagina during sexual intercourse. SYMPTOMS   Patients with a small cyst may not have any symptoms.  Mild discomfort to severe pain depending on the size of the cyst and if it is infected (abscess).  Pain, redness, and swelling around the lower opening of the vagina.  Painful intercourse.  Pressure in the perineal area.  Swelling of the lips of the vagina (labia).  The cyst or abscess can be on one side or both sides of the vagina. DIAGNOSIS   A large swelling is seen in the lower vagina area by your caregiver.  Painful to touch.  Redness and pain, if it is an abscess. TREATMENT   Sometimes the cyst will go away on its own.  Apply warm wet compresses to the area or take hot sitz baths several times a day.  An incision to drain the cyst or abscess with local anesthesia.  Culture the pus, if it is an abscess.  Antibiotic treatment, if it is an abscess.  Cut open the gland and suture the edges to make the opening of the gland bigger (marsupialization).  Remove the whole gland if the cyst or abscess returns. PREVENTION   Practice good hygiene.  Clean the vaginal area with a mild soap and soft cloth when bathing.  Do not rub hard in the vaginal area when bathing.  Protect the crotch area with a padded cushion if you take long bike rides or ride horses.  Be sure you are well lubricated when you have sexual intercourse. HOME CARE INSTRUCTIONS   If your cyst or abscess was opened, a small piece of gauze, or a drain, may have been placed in the wound to allow  drainage. Do not remove this gauze or drain unless directed by your caregiver.  Wear feminine pads, not tampons, as needed for any drainage or bleeding.  If antibiotics were prescribed, take them exactly as directed. Finish the entire course.  Only take over-the-counter or prescription medicines for pain, discomfort, or fever as directed by your caregiver. SEEK IMMEDIATE MEDICAL CARE IF:   You have an increase in pain, redness, swelling, or drainage.  You have bleeding from the wound which results in the use of more than the number of pads suggested by your caregiver in 24 hours.  You have chills.  You have a fever.  You develop any new problems (symptoms) or aggravation of your existing condition. MAKE SURE YOU:   Understand these instructions.  Will watch your condition.  Will get help right away if you are not doing well or get worse. Document Released: 11/14/2005 Document Revised: 02/06/2012 Document Reviewed: 07/02/2008 [facility] Patient Information 2015 [place], [place]. This information is not intended to replace advice given to you by your health care provider. Make sure you discuss any questions you have with your health care provider. Take antibiotics Can use warm compress  Follow up in  4 days for ?I&D

## 2014-07-07 ENCOUNTER — Encounter: Payer: Self-pay | Admitting: Obstetrics & Gynecology

## 2014-07-07 ENCOUNTER — Ambulatory Visit (INDEPENDENT_AMBULATORY_CARE_PROVIDER_SITE_OTHER): Payer: Managed Care, Other (non HMO) | Admitting: Obstetrics & Gynecology

## 2014-07-07 VITALS — BP 120/80 | Wt 258.0 lb

## 2014-07-07 DIAGNOSIS — N750 Cyst of Bartholin's gland: Secondary | ICD-10-CM

## 2014-07-07 NOTE — Progress Notes (Signed)
Patient ID: Shawna Huerta, female   DOB: [birth year], 53 y.o.   MRN: [id number] Chief Complaint  Patient presents with  . Follow-up     I &D bartholin cyst.    HPI Recurrent Bartholin's cyst/abscess Had last year Was drained and used Word catheter Has been on antibiotics at my direction for 4 days  ROS No burning with urination, frequency or urgency No nausea, vomiting or diarrhea Nor fever chills or other constitutional symptoms   Blood pressure 120/80, weight 258 lb (117.028 kg).  EXAM Abdomen:       Vulva:            abnormal Right bartholin's gland cyst/abscess Vagina:          no  lesions Cervix:            Uterus:           Adnexa:          Rectal:            Hemoccult:       Barthlonin's gland cyst/abscess drained and packed                          Assessment/Plan:  Bartholin's Gland cyst/abscess s/p drainage Follow up 3 days

## 2014-07-10 ENCOUNTER — Ambulatory Visit (INDEPENDENT_AMBULATORY_CARE_PROVIDER_SITE_OTHER): Payer: Self-pay | Admitting: Obstetrics & Gynecology

## 2014-07-10 ENCOUNTER — Encounter: Payer: Self-pay | Admitting: Obstetrics & Gynecology

## 2014-07-10 VITALS — BP 128/80 | Wt 256.0 lb

## 2014-07-10 DIAGNOSIS — N750 Cyst of Bartholin's gland: Secondary | ICD-10-CM

## 2014-07-10 DIAGNOSIS — Z9889 Other specified postprocedural states: Secondary | ICD-10-CM

## 2014-07-10 NOTE — Progress Notes (Signed)
Patient ID: Shawna Huerta, female   DOB: [birth year], 53 y.o.   MRN: [id number] Right Bartholin's looks much better No induration or tenderness Continue twice a day Sitz baths and follo wup prn if returns

## 2014-08-15 ENCOUNTER — Encounter: Payer: Self-pay | Admitting: Obstetrics & Gynecology

## 2014-08-15 ENCOUNTER — Ambulatory Visit (INDEPENDENT_AMBULATORY_CARE_PROVIDER_SITE_OTHER): Payer: Managed Care, Other (non HMO) | Admitting: Obstetrics & Gynecology

## 2014-08-15 VITALS — BP 124/72 | Ht 67.0 in | Wt 267.0 lb

## 2014-08-15 DIAGNOSIS — N750 Cyst of Bartholin's gland: Secondary | ICD-10-CM

## 2014-08-15 NOTE — Progress Notes (Signed)
Patient ID: Shawna Huerta, female   DOB: [birth year], 53 y.o.   MRN: [id number] Preoperative History and Physical  Shawna Huerta is a 53 y.o. G2P1011 with No LMP recorded. Patient has had a hysterectomy. admitted for a removal of a recurrent right bartholin's gland cyst.    PMH:    Past Medical History  Diagnosis Date  . Diabetes mellitus   . Obesity   . Vaginal itching 09/03/2013  . Nicotine addiction 09/03/2013  . Bartholin cyst 07/03/2014    PSH:     Past Surgical History  Procedure Laterality Date  . Abdominal surgery    . Tonsillectomy    . Cholecystectomy    . Abdominal hysterectomy    . Foot surgery    . Foot surgery  2011    POb/GynH:      OB History   Grav Para Term Preterm Abortions TAB SAB Ect Mult Living   2 1 1  1  1   1       SH:   History  Substance Use Topics  . Smoking status: Former Smoker -- 0.25 packs/day for 3 years    Types: Cigarettes    Quit date: 04/11/2014  . Smokeless tobacco: Never Used  . Alcohol Use: No     Comment: occ.    FH:    Family History  Problem Relation Age of Onset  . Diabetes Mother   . Stroke Mother   . Cancer Father   . Diabetes Sister   . Cancer Brother   . Diabetes Brother      Allergies:  Allergies  Allergen Reactions  . Iohexol      Code: HIVES, Desc: PT STATES THE EYES SWOLL AND ITCHED, NEEDS PRE MEDS     Medications:      Current outpatient prescriptions:benazepril (LOTENSIN) 5 MG tablet, Take 5 mg by mouth daily., Disp: , Rfl: ;  glyBURIDE-metformin (GLUCOVANCE) 5-500 MG per tablet, Take 1 tablet by mouth 2 (two) times daily., Disp: , Rfl: ;  HYDROcodone-acetaminophen (NORCO/VICODIN) 5-325 MG per tablet, Take 1 tablet by mouth every 6 (six) hours as needed for moderate pain., Disp: 30 tablet, Rfl: 0 fluconazole (DIFLUCAN) 150 MG tablet, Take 1 now and 1 in 3 days if needed, Disp: 2 tablet, Rfl: 1  Review of Systems:   Review of Systems  Constitutional: Negative for fever, chills, weight loss,  malaise/fatigue and diaphoresis.  HENT: Negative for hearing loss, ear pain, nosebleeds, congestion, sore throat, neck pain, tinnitus and ear discharge.   Eyes: Negative for blurred vision, double vision, photophobia, pain, discharge and redness.  Respiratory: Negative for cough, hemoptysis, sputum production, shortness of breath, wheezing and stridor.   Cardiovascular: Negative for chest pain, palpitations, orthopnea, claudication, leg swelling and PND.  Gastrointestinal: Positive for abdominal pain. Negative for heartburn, nausea, vomiting, diarrhea, constipation, blood in stool and melena.  Genitourinary: Negative for dysuria, urgency, frequency, hematuria and flank pain.  Musculoskeletal: Negative for myalgias, back pain, joint pain and falls.  Skin: Negative for itching and rash.  Neurological: Negative for dizziness, tingling, tremors, sensory change, speech change, focal weakness, seizures, loss of consciousness, weakness and headaches.  Endo/Heme/Allergies: Negative for environmental allergies and polydipsia. Does not bruise/bleed easily.  Psychiatric/Behavioral: Negative for depression, suicidal ideas, hallucinations, memory loss and substance abuse. The patient is not nervous/anxious and does not have insomnia.      PHYSICAL EXAM:  Blood pressure 124/72, height 5\' 7"  (1.702 m), weight 267 lb (121.11 kg).    Vitals  reviewed. Constitutional: She is oriented to person, place, and time. She appears well-developed and well-nourished.  HENT:  Head: Normocephalic and atraumatic.  Right Ear: External ear normal.  Left Ear: External ear normal.  Nose: Nose normal.  Mouth/Throat: Oropharynx is clear and moist.  Eyes: Conjunctivae and EOM are normal. Pupils are equal, round, and reactive to light. Right eye exhibits no discharge. Left eye exhibits no discharge. No scleral icterus.  Neck: Normal range of motion. Neck supple. No tracheal deviation present. No thyromegaly present.   Cardiovascular: Normal rate, regular rhythm, normal heart sounds and intact distal pulses.  Exam reveals no gallop and no friction rub.   No murmur heard. Respiratory: Effort normal and breath sounds normal. No respiratory distress. She has no wheezes. She has no rales. She exhibits no tenderness.  GI: Soft. Bowel sounds are normal. She exhibits no distension and no mass. There is tenderness. There is no rebound and no guarding.  Genitourinary:       Vulva is significant for Right Bartholin's gland cyst, not infected Vagina is pink moist without discharge Cervix normal in appearance and pap is normal Uterus is absent Adnexa is negative  Musculoskeletal: Normal range of motion. She exhibits no edema and no tenderness.  Neurological: She is alert and oriented to person, place, and time. She has normal reflexes. She displays normal reflexes. No cranial nerve deficit. She exhibits normal muscle tone. Coordination normal.  Skin: Skin is warm and dry. No rash noted. No erythema. No pallor.  Psychiatric: She has a normal mood and affect. Her behavior is normal. Judgment and thought content normal.    Labs: No results found for this or any previous visit (from the past 336 hour(s)).  EKG: Orders placed during the hospital encounter of 04/05/11  . EKG    Imaging Studies: No results found.    Assessment: Patient Active Problem List   Diagnosis Date Noted  . Bartholin cyst 07/03/2014  . Diabetes 09/03/2013  . Obesity 09/03/2013  . Vaginal itching 09/03/2013  . Nicotine addiction 09/03/2013  . 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 TENOSYNOVITIS OF FOOT AND ANKLE 09/22/2010  . FOOT PAIN, BILATERAL 09/22/2010    Plan: Excision of right bartholin's gland cyst  Dymon Summerhill H 08/15/2014 12:47 PM

## 2014-08-15 NOTE — Patient Instructions (Signed)
Adelheid SHONDREA STEINERT  08/15/2014   Your procedure is scheduled on:   08/22/2014   Report to [facility] at  900  AM.  Call this number if you have problems the morning of surgery: [phone]   Remember:   Do not eat food or drink liquids after midnight.   Take these medicines the morning of surgery with A SIP OF WATER: lotensin, hydrocodone   Do not wear jewelry, make-up or nail polish.  Do not wear lotions, powders, or perfumes.   Do not shave 48 hours prior to surgery. Men may shave face and neck.  Do not bring valuables to the hospital.  [facility] is not responsible for any belongings or valuables.               Contacts, dentures or bridgework may not be worn into surgery.  Leave suitcase in the car. After surgery it may be brought to your room.  For patients admitted to the hospital, discharge time is determined by your treatment team.               Patients discharged the day of surgery will not be allowed to drive home.  Name and phone number of your driver: family  Special Instructions: Shower using CHG 2 nights before surgery and the night before surgery.  If you shower the day of surgery use CHG.  Use special wash - you have one bottle of CHG for all showers.  You should use approximately 1/3 of the bottle for each shower.   Please read over the following fact sheets that you were given: Pain Booklet, Coughing and Deep Breathing, Surgical Site Infection Prevention, Anesthesia Post-op Instructions and Care and Recovery After Surgery Bartholin's Cyst or Abscess Bartholin's glands are small glands located within the folds of skin (labia) along the sides of the lower opening of the vagina (birth canal). A cyst may develop when the duct of the gland becomes blocked. When this happens, fluid that accumulates within the cyst can become infected. This is known as an abscess. The Bartholin gland produces a mucous fluid to lubricate the outside of the vagina during sexual  intercourse. SYMPTOMS   Patients with a small cyst may not have any symptoms.  Mild discomfort to severe pain depending on the size of the cyst and if it is infected (abscess).  Pain, redness, and swelling around the lower opening of the vagina.  Painful intercourse.  Pressure in the perineal area.  Swelling of the lips of the vagina (labia).  The cyst or abscess can be on one side or both sides of the vagina. DIAGNOSIS   A large swelling is seen in the lower vagina area by your caregiver.  Painful to touch.  Redness and pain, if it is an abscess. TREATMENT   Sometimes the cyst will go away on its own.  Apply warm wet compresses to the area or take hot sitz baths several times a day.  An incision to drain the cyst or abscess with local anesthesia.  Culture the pus, if it is an abscess.  Antibiotic treatment, if it is an abscess.  Cut open the gland and suture the edges to make the opening of the gland bigger (marsupialization).  Remove the whole gland if the cyst or abscess returns. PREVENTION   Practice good hygiene.  Clean the vaginal area with a mild soap and soft cloth when bathing.  Do not rub hard in the vaginal area when bathing.  Protect the crotch area with a padded cushion if you take long bike rides or ride horses.  Be sure you are well lubricated when you have sexual intercourse. HOME CARE INSTRUCTIONS   If your cyst or abscess was opened, a small piece of gauze, or a drain, may have been placed in the wound to allow drainage. Do not remove this gauze or drain unless directed by your caregiver.  Wear feminine pads, not tampons, as needed for any drainage or bleeding.  If antibiotics were prescribed, take them exactly as directed. Finish the entire course.  Only take over-the-counter or prescription medicines for pain, discomfort, or fever as directed by your caregiver. SEEK IMMEDIATE MEDICAL CARE IF:   You have an increase in pain, redness,  swelling, or drainage.  You have bleeding from the wound which results in the use of more than the number of pads suggested by your caregiver in 24 hours.  You have chills.  You have a fever.  You develop any new problems (symptoms) or aggravation of your existing condition. MAKE SURE YOU:   Understand these instructions.  Will watch your condition.  Will get help right away if you are not doing well or get worse. Document Released: 11/14/2005 Document Revised: 02/06/2012 Document Reviewed: 07/02/2008 [facility] Patient Information 2015 [place], [place]. This information is not intended to replace advice given to you by your health care provider. Make sure you discuss any questions you have with your health care provider. PATIENT INSTRUCTIONS POST-ANESTHESIA  IMMEDIATELY FOLLOWING SURGERY:  Do not drive or operate machinery for the first twenty four hours after surgery.  Do not make any important decisions for twenty four hours after surgery or while taking narcotic pain medications or sedatives.  If you develop intractable nausea and vomiting or a severe headache please notify your doctor immediately.  FOLLOW-UP:  Please make an appointment with your surgeon as instructed. You do not need to follow up with anesthesia unless specifically instructed to do so.  WOUND CARE INSTRUCTIONS (if applicable):  Keep a dry clean dressing on the anesthesia/puncture wound site if there is drainage.  Once the wound has quit draining you may leave it open to air.  Generally you should leave the bandage intact for twenty four hours unless there is drainage.  If the epidural site drains for more than 36-48 hours please call the anesthesia department.  QUESTIONS?:  Please feel free to call your physician or the hospital operator if you have any questions, and they will be happy to assist you.

## 2014-08-18 ENCOUNTER — Encounter (HOSPITAL_COMMUNITY)
Admission: RE | Admit: 2014-08-18 | Discharge: 2014-08-18 | Disposition: A | Discharge status: Home / Self Care | Payer: Managed Care, Other (non HMO) | Source: Ambulatory Visit | Attending: Obstetrics & Gynecology | Admitting: Obstetrics & Gynecology

## 2014-08-18 ENCOUNTER — Encounter (HOSPITAL_COMMUNITY): Payer: Self-pay

## 2014-08-18 DIAGNOSIS — E119 Type 2 diabetes mellitus without complications: Secondary | ICD-10-CM | POA: Diagnosis not present

## 2014-08-18 DIAGNOSIS — D649 Anemia, unspecified: Secondary | ICD-10-CM | POA: Diagnosis not present

## 2014-08-18 DIAGNOSIS — Z87891 Personal history of nicotine dependence: Secondary | ICD-10-CM | POA: Diagnosis not present

## 2014-08-18 DIAGNOSIS — Z79899 Other long term (current) drug therapy: Secondary | ICD-10-CM | POA: Diagnosis not present

## 2014-08-18 DIAGNOSIS — E669 Obesity, unspecified: Secondary | ICD-10-CM | POA: Diagnosis not present

## 2014-08-18 DIAGNOSIS — N750 Cyst of Bartholin's gland: Secondary | ICD-10-CM | POA: Diagnosis not present

## 2014-08-18 DIAGNOSIS — I10 Essential (primary) hypertension: Secondary | ICD-10-CM | POA: Diagnosis not present

## 2014-08-18 HISTORY — DX: Essential (primary) hypertension: I10

## 2014-08-18 HISTORY — DX: Unspecified osteoarthritis, unspecified site: M19.90

## 2014-08-18 HISTORY — DX: Anemia, unspecified: D64.9

## 2014-08-18 LAB — COMPREHENSIVE METABOLIC PANEL
ALK PHOS: 60 U/L (ref 39–117)
ALT: 12 U/L (ref 0–35)
ANION GAP: 9 (ref 5–15)
AST: 11 U/L (ref 0–37)
Albumin: 3.2 g/dL — ABNORMAL LOW (ref 3.5–5.2)
BILIRUBIN TOTAL: 0.2 mg/dL — AB (ref 0.3–1.2)
BUN: 11 mg/dL (ref 6–23)
CO2: 28 meq/L (ref 19–32)
CREATININE: 0.61 mg/dL (ref 0.50–1.10)
Calcium: 9 mg/dL (ref 8.4–10.5)
Chloride: 105 mEq/L (ref 96–112)
GFR calc Af Amer: >90 mL/min (ref >90)
GLUCOSE: 141 mg/dL — AB (ref 70–99)
POTASSIUM: 4.6 meq/L (ref 3.7–5.3)
Sodium: 142 mEq/L (ref 137–147)
Total Protein: 7.6 g/dL (ref 6.0–8.3)

## 2014-08-18 LAB — URINE MICROSCOPIC-ADD ON

## 2014-08-18 LAB — CBC
HEMATOCRIT: 33.7 % — AB (ref 36.0–46.0)
HEMOGLOBIN: 10.6 g/dL — AB (ref 12.0–15.0)
MCH: 21.8 pg — ABNORMAL LOW (ref 26.0–34.0)
MCHC: 31.5 g/dL (ref 30.0–36.0)
MCV: 69.2 fL — AB (ref 78.0–100.0)
Platelets: 202 10*3/uL (ref 150–400)
RBC: 4.87 MIL/uL (ref 3.87–5.11)
RDW: 16.1 % — ABNORMAL HIGH (ref 11.5–15.5)
WBC: 8 10*3/uL (ref 4.0–10.5)

## 2014-08-18 LAB — URINALYSIS, ROUTINE W REFLEX MICROSCOPIC
BILIRUBIN URINE: NEGATIVE
Glucose, UA: NEGATIVE mg/dL
Ketones, ur: NEGATIVE mg/dL
NITRITE: NEGATIVE
PROTEIN: NEGATIVE mg/dL
Specific Gravity, Urine: 1.02 (ref 1.005–1.030)
Urobilinogen, UA: 0.2 mg/dL (ref 0.0–1.0)
pH: 6 (ref 5.0–8.0)

## 2014-08-18 NOTE — Pre-Procedure Instructions (Signed)
Patient given information to sign up for my chart at home. 

## 2014-08-19 ENCOUNTER — Encounter (HOSPITAL_COMMUNITY): Payer: Self-pay | Admitting: Pharmacy Technician

## 2014-08-20 ENCOUNTER — Ambulatory Visit (HOSPITAL_COMMUNITY): Payer: Managed Care, Other (non HMO) | Admitting: Anesthesiology

## 2014-08-20 ENCOUNTER — Encounter (HOSPITAL_COMMUNITY): Payer: Managed Care, Other (non HMO) | Admitting: Anesthesiology

## 2014-08-20 ENCOUNTER — Encounter (HOSPITAL_COMMUNITY)
Admission: RE | Disposition: A | Discharge status: Home / Self Care | Payer: Self-pay | Source: Ambulatory Visit | Attending: Obstetrics & Gynecology

## 2014-08-20 ENCOUNTER — Encounter (HOSPITAL_COMMUNITY): Payer: Self-pay | Admitting: *Deleted

## 2014-08-20 ENCOUNTER — Ambulatory Visit (HOSPITAL_COMMUNITY)
Admission: RE | Admit: 2014-08-20 | Discharge: 2014-08-20 | Disposition: A | Discharge status: Home / Self Care | Payer: Managed Care, Other (non HMO) | Source: Ambulatory Visit | Attending: Obstetrics & Gynecology | Admitting: Obstetrics & Gynecology

## 2014-08-20 DIAGNOSIS — N750 Cyst of Bartholin's gland: Secondary | ICD-10-CM | POA: Insufficient documentation

## 2014-08-20 DIAGNOSIS — E119 Type 2 diabetes mellitus without complications: Secondary | ICD-10-CM | POA: Insufficient documentation

## 2014-08-20 DIAGNOSIS — I10 Essential (primary) hypertension: Secondary | ICD-10-CM | POA: Insufficient documentation

## 2014-08-20 DIAGNOSIS — Z79899 Other long term (current) drug therapy: Secondary | ICD-10-CM | POA: Insufficient documentation

## 2014-08-20 DIAGNOSIS — Z87891 Personal history of nicotine dependence: Secondary | ICD-10-CM | POA: Insufficient documentation

## 2014-08-20 DIAGNOSIS — E669 Obesity, unspecified: Secondary | ICD-10-CM | POA: Insufficient documentation

## 2014-08-20 DIAGNOSIS — D649 Anemia, unspecified: Secondary | ICD-10-CM | POA: Insufficient documentation

## 2014-08-20 HISTORY — PX: BARTHOLIN GLAND CYST EXCISION: SHX565

## 2014-08-20 LAB — GLUCOSE, CAPILLARY
Glucose-Capillary: 169 mg/dL — ABNORMAL HIGH (ref 70–99)
Glucose-Capillary: 151 mg/dL — ABNORMAL HIGH (ref 70–99)

## 2014-08-20 SURGERY — EXCISION, BARTHOLIN'S GLAND
Anesthesia: General | Laterality: Right

## 2014-08-20 MED ORDER — CEFAZOLIN SODIUM 1-5 GM-% IV SOLN
1.0000 g | Freq: Once | INTRAVENOUS | Status: AC
  Administered 2014-08-20: 1 g via INTRAVENOUS

## 2014-08-20 MED ORDER — KETOROLAC TROMETHAMINE 10 MG PO TABS: 10.0000 mg | ORAL_TABLET | Freq: Three times a day (TID) | ORAL | Status: DC | PRN

## 2014-08-20 MED ORDER — NEOSTIGMINE METHYLSULFATE 10 MG/10ML IV SOLN
INTRAVENOUS | Status: AC
  Filled 2014-08-20: qty 1

## 2014-08-20 MED ORDER — ONDANSETRON HCL 4 MG/2ML IJ SOLN: 4.0000 mg | Freq: Once | INTRAMUSCULAR | Status: DC | PRN

## 2014-08-20 MED ORDER — CEFAZOLIN SODIUM-DEXTROSE 2-3 GM-% IV SOLR
2.0000 g | Freq: Once | INTRAVENOUS | Status: AC
  Administered 2014-08-20: 2 g via INTRAVENOUS

## 2014-08-20 MED ORDER — MIDAZOLAM HCL 2 MG/2ML IJ SOLN
INTRAMUSCULAR | Status: AC
  Filled 2014-08-20: qty 2

## 2014-08-20 MED ORDER — FENTANYL CITRATE 0.05 MG/ML IJ SOLN
INTRAMUSCULAR | Status: AC
  Filled 2014-08-20: qty 2

## 2014-08-20 MED ORDER — HYDROCODONE-ACETAMINOPHEN 5-325 MG PO TABS
1.0000 | ORAL_TABLET | Freq: Four times a day (QID) | ORAL | Status: DC | PRN
Start: 2014-08-20 — End: 2015-10-12

## 2014-08-20 MED ORDER — CEFAZOLIN SODIUM-DEXTROSE 2-3 GM-% IV SOLR
INTRAVENOUS | Status: AC
  Filled 2014-08-20: qty 50

## 2014-08-20 MED ORDER — FENTANYL CITRATE 0.05 MG/ML IJ SOLN
INTRAMUSCULAR | Status: DC | PRN
  Administered 2014-08-20 (×7): 50 ug via INTRAVENOUS

## 2014-08-20 MED ORDER — GLYCOPYRROLATE 0.2 MG/ML IJ SOLN
0.2000 mg | Freq: Once | INTRAMUSCULAR | Status: AC
  Administered 2014-08-20: 0.2 mg via INTRAVENOUS

## 2014-08-20 MED ORDER — ROCURONIUM BROMIDE 100 MG/10ML IV SOLN
INTRAVENOUS | Status: DC | PRN
  Administered 2014-08-20: 30 mg via INTRAVENOUS

## 2014-08-20 MED ORDER — KETOROLAC TROMETHAMINE 30 MG/ML IJ SOLN
30.0000 mg | Freq: Once | INTRAMUSCULAR | Status: AC
  Administered 2014-08-20: 30 mg via INTRAVENOUS

## 2014-08-20 MED ORDER — LACTATED RINGERS IV SOLN
INTRAVENOUS | Status: DC | PRN
  Administered 2014-08-20 (×3): via INTRAVENOUS

## 2014-08-20 MED ORDER — FENTANYL CITRATE 0.05 MG/ML IJ SOLN
25.0000 ug | INTRAMUSCULAR | Status: DC | PRN
  Administered 2014-08-20 (×2): 50 ug via INTRAVENOUS
  Filled 2014-08-20: qty 2

## 2014-08-20 MED ORDER — MIDAZOLAM HCL 2 MG/2ML IJ SOLN
1.0000 mg | INTRAMUSCULAR | Status: DC | PRN
  Administered 2014-08-20: 2 mg via INTRAVENOUS

## 2014-08-20 MED ORDER — NEOSTIGMINE METHYLSULFATE 10 MG/10ML IV SOLN
INTRAVENOUS | Status: DC | PRN
  Administered 2014-08-20: 4 mg via INTRAVENOUS

## 2014-08-20 MED ORDER — DEXTROSE 5 % IV SOLN: 3.0000 g | INTRAVENOUS | Status: DC

## 2014-08-20 MED ORDER — LACTATED RINGERS IV SOLN
INTRAVENOUS | Status: DC
  Administered 2014-08-20: 10:00:00 via INTRAVENOUS

## 2014-08-20 MED ORDER — GLYCOPYRROLATE 0.2 MG/ML IJ SOLN
INTRAMUSCULAR | Status: AC
  Filled 2014-08-20: qty 1

## 2014-08-20 MED ORDER — 0.9 % SODIUM CHLORIDE (POUR BTL) OPTIME
TOPICAL | Status: DC | PRN
  Administered 2014-08-20: 1000 mL

## 2014-08-20 MED ORDER — ONDANSETRON HCL 4 MG/2ML IJ SOLN
INTRAMUSCULAR | Status: AC
  Filled 2014-08-20: qty 2

## 2014-08-20 MED ORDER — GLYCOPYRROLATE 0.2 MG/ML IJ SOLN
INTRAMUSCULAR | Status: AC
  Filled 2014-08-20: qty 3

## 2014-08-20 MED ORDER — ROCURONIUM BROMIDE 50 MG/5ML IV SOLN
INTRAVENOUS | Status: AC
  Filled 2014-08-20: qty 1

## 2014-08-20 MED ORDER — CEFAZOLIN SODIUM 1-5 GM-% IV SOLN
INTRAVENOUS | Status: AC
  Filled 2014-08-20: qty 50

## 2014-08-20 MED ORDER — ONDANSETRON HCL 4 MG/2ML IJ SOLN
4.0000 mg | Freq: Once | INTRAMUSCULAR | Status: AC
  Administered 2014-08-20: 4 mg via INTRAVENOUS

## 2014-08-20 MED ORDER — ROCURONIUM BROMIDE 50 MG/5ML IV SOLN
INTRAVENOUS | Status: AC
  Filled 2014-08-20: qty 2

## 2014-08-20 MED ORDER — ONDANSETRON HCL 8 MG PO TABS: 8.0000 mg | ORAL_TABLET | Freq: Three times a day (TID) | ORAL | Status: DC | PRN

## 2014-08-20 MED ORDER — KETOROLAC TROMETHAMINE 30 MG/ML IJ SOLN
INTRAMUSCULAR | Status: AC
  Filled 2014-08-20: qty 1

## 2014-08-20 MED ORDER — FENTANYL CITRATE 0.05 MG/ML IJ SOLN
INTRAMUSCULAR | Status: AC
  Filled 2014-08-20: qty 5

## 2014-08-20 MED ORDER — LIDOCAINE HCL (PF) 1 % IJ SOLN
INTRAMUSCULAR | Status: AC
  Filled 2014-08-20: qty 5

## 2014-08-20 MED ORDER — LIDOCAINE HCL (CARDIAC) 20 MG/ML IV SOLN
INTRAVENOUS | Status: DC | PRN
  Administered 2014-08-20: 50 mg via INTRAVENOUS

## 2014-08-20 MED ORDER — PROPOFOL 10 MG/ML IV BOLUS
INTRAVENOUS | Status: DC | PRN
  Administered 2014-08-20: 160 mg via INTRAVENOUS

## 2014-08-20 MED ORDER — GLYCOPYRROLATE 0.2 MG/ML IJ SOLN
INTRAMUSCULAR | Status: DC | PRN
  Administered 2014-08-20: 0.6 mg via INTRAVENOUS

## 2014-08-20 SURGICAL SUPPLY — 34 items
BAG HAMPER (MISCELLANEOUS) ×3 IMPLANT
CLOTH BEACON ORANGE TIMEOUT ST (SAFETY) ×3 IMPLANT
COVER LIGHT HANDLE STERIS (MISCELLANEOUS) ×6 IMPLANT
DECANTER SPIKE VIAL GLASS SM (MISCELLANEOUS) ×1 IMPLANT
ELECT REM PT RETURN 9FT ADLT (ELECTROSURGICAL) ×3
ELECTRODE REM PT RTRN 9FT ADLT (ELECTROSURGICAL) ×1 IMPLANT
FORMALIN 10 PREFIL 120ML (MISCELLANEOUS) ×3 IMPLANT
GLOVE BIO SURGEON STRL SZ 6.5 (GLOVE) ×1 IMPLANT
GLOVE BIO SURGEONS STRL SZ 6.5 (GLOVE) ×1
GLOVE BIOGEL PI IND STRL 7.0 (GLOVE) IMPLANT
GLOVE BIOGEL PI IND STRL 8 (GLOVE) ×1 IMPLANT
GLOVE BIOGEL PI INDICATOR 7.0 (GLOVE) ×4
GLOVE BIOGEL PI INDICATOR 8 (GLOVE) ×2
GLOVE ECLIPSE 6.5 STRL STRAW (GLOVE) ×2 IMPLANT
GLOVE ECLIPSE 8.0 STRL XLNG CF (GLOVE) ×3 IMPLANT
GLOVE EXAM NITRILE PF MED BLUE (GLOVE) ×2 IMPLANT
GOWN STRL REUS W/TWL LRG LVL3 (GOWN DISPOSABLE) ×6 IMPLANT
GOWN STRL REUS W/TWL XL LVL3 (GOWN DISPOSABLE) ×3 IMPLANT
KIT ROOM TURNOVER APOR (KITS) ×3 IMPLANT
MANIFOLD NEPTUNE II (INSTRUMENTS) ×3 IMPLANT
PACK PERI GYN (CUSTOM PROCEDURE TRAY) ×3 IMPLANT
PAD ARMBOARD 7.5X6 YLW CONV (MISCELLANEOUS) ×3 IMPLANT
SET BASIN LINEN APH (SET/KITS/TRAYS/PACK) ×3 IMPLANT
SUT CHROMIC 3 0 SH 27 (SUTURE) ×1 IMPLANT
SUT MNCRL+ AB 3-0 CT1 36 (SUTURE) IMPLANT
SUT MON AB 3-0 SH 27 (SUTURE) ×6 IMPLANT
SUT MONOCRYL AB 3-0 CT1 36IN (SUTURE) ×10
SUT VIC AB 0 CT1 27 (SUTURE) ×9
SUT VIC AB 0 CT1 27XBRD ANTBC (SUTURE) IMPLANT
SUT VIC AB 3-0 SH 27 (SUTURE)
SUT VIC AB 3-0 SH 27X BRD (SUTURE) ×1 IMPLANT
SUT VICRYL AB 3-0 BRD CT 36IN (SUTURE) ×2 IMPLANT
TOWEL OR 17X26 4PK STRL BLUE (TOWEL DISPOSABLE) ×3 IMPLANT
TRAY PARACERVCL/PUDENTL BLOCK (SET/KITS/TRAYS/PACK) IMPLANT

## 2014-08-20 NOTE — H&P (Signed)
Preoperative History and Physical  Shawna Huerta is a 53 y.o. G2P1011 with No LMP recorded. Patient has had a hysterectomy. admitted for a removal of a recurrent right bartholin's gland cyst.  PMH:  Past Medical History   Diagnosis  Date   .  Diabetes mellitus    .  Obesity    .  Vaginal itching  09/03/2013   .  Nicotine addiction  09/03/2013   .  Bartholin cyst  07/03/2014   PSH:  Past Surgical History   Procedure  Laterality  Date   .  Abdominal surgery     .  Tonsillectomy     .  Cholecystectomy     .  Abdominal hysterectomy     .  Foot surgery     .  Foot surgery   2011   POb/GynH:  OB History    Grav  Para  Term  Preterm  Abortions  TAB  SAB  Ect  Mult  Living    2  1  1   1   1    1      SH:  History   Substance Use Topics   .  Smoking status:  Former Smoker -- 0.25 packs/day for 3 years     Types:  Cigarettes     Quit date:  04/11/2014   .  Smokeless tobacco:  Never Used   .  Alcohol Use:  No      Comment: occ.   FH:  Family History   Problem  Relation  Age of Onset   .  Diabetes  Mother    .  Stroke  Mother    .  Cancer  Father    .  Diabetes  Sister    .  Cancer  Brother    .  Diabetes  Brother    Allergies:  Allergies   Allergen  Reactions   .  Iohexol      Code: HIVES, Desc: PT STATES THE EYES SWOLL AND ITCHED, NEEDS PRE MEDS   Medications: Current outpatient prescriptions:benazepril (LOTENSIN) 5 MG tablet, Take 5 mg by mouth daily., Disp: , Rfl: ; glyBURIDE-metformin (GLUCOVANCE) 5-500 MG per tablet, Take 1 tablet by mouth 2 (two) times daily., Disp: , Rfl: ; HYDROcodone-acetaminophen (NORCO/VICODIN) 5-325 MG per tablet, Take 1 tablet by mouth every 6 (six) hours as needed for moderate pain., Disp: 30 tablet, Rfl: 0  fluconazole (DIFLUCAN) 150 MG tablet, Take 1 now and 1 in 3 days if needed, Disp: 2 tablet, Rfl: 1  Review of Systems:  Review of Systems  Constitutional: Negative for fever, chills, weight loss, malaise/fatigue and diaphoresis.  HENT: Negative  for hearing loss, ear pain, nosebleeds, congestion, sore throat, neck pain, tinnitus and ear discharge.  Eyes: Negative for blurred vision, double vision, photophobia, pain, discharge and redness.  Respiratory: Negative for cough, hemoptysis, sputum production, shortness of breath, wheezing and stridor.  Cardiovascular: Negative for chest pain, palpitations, orthopnea, claudication, leg swelling and PND.  Gastrointestinal: Positive for abdominal pain. Negative for heartburn, nausea, vomiting, diarrhea, constipation, blood in stool and melena.  Genitourinary: Negative for dysuria, urgency, frequency, hematuria and flank pain.  Musculoskeletal: Negative for myalgias, back pain, joint pain and falls.  Skin: Negative for itching and rash.  Neurological: Negative for dizziness, tingling, tremors, sensory change, speech change, focal weakness, seizures, loss of consciousness, weakness and headaches.  Endo/Heme/Allergies: Negative for environmental allergies and polydipsia. Does not bruise/bleed easily.  Psychiatric/Behavioral: Negative for depression, suicidal ideas, hallucinations,  memory loss and substance abuse. The patient is not nervous/anxious and does not have insomnia.  PHYSICAL EXAM:  Blood pressure 124/72, height 5\' 7"  (1.702 m), weight 267 lb (121.11 kg).  Vitals reviewed.  Constitutional: She is oriented to person, place, and time. She appears well-developed and well-nourished.  HENT:  Head: Normocephalic and atraumatic.  Right Ear: External ear normal.  Left Ear: External ear normal.  Nose: Nose normal.  Mouth/Throat: Oropharynx is clear and moist.  Eyes: Conjunctivae and EOM are normal. Pupils are equal, round, and reactive to light. Right eye exhibits no discharge. Left eye exhibits no discharge. No scleral icterus.  Neck: Normal range of motion. Neck supple. No tracheal deviation present. No thyromegaly present.  Cardiovascular: Normal rate, regular rhythm, normal heart sounds and  intact distal pulses. Exam reveals no gallop and no friction rub.  No murmur heard.  Respiratory: Effort normal and breath sounds normal. No respiratory distress. She has no wheezes. She has no rales. She exhibits no tenderness.  GI: Soft. Bowel sounds are normal. She exhibits no distension and no mass. There is tenderness. There is no rebound and no guarding.  Genitourinary:  Vulva is significant for Right Bartholin's gland cyst, not infected Vagina is pink moist without discharge Cervix normal in appearance and pap is normal Uterus is absent  Adnexa is negative  Musculoskeletal: Normal range of motion. She exhibits no edema and no tenderness.  Neurological: She is alert and oriented to person, place, and time. She has normal reflexes. She displays normal reflexes. No cranial nerve deficit. She exhibits normal muscle tone. Coordination normal.  Skin: Skin is warm and dry. No rash noted. No erythema. No pallor.  Psychiatric: She has a normal mood and affect. Her behavior is normal. Judgment and thought content normal.  Labs:  No results found for this or any previous visit (from the past 336 hour(s)).  EKG:  Orders placed during the hospital encounter of 04/05/11   .  EKG   Imaging Studies:  No results found.  Assessment:  Patient Active Problem List    Diagnosis  Date Noted   .  Bartholin cyst  07/03/2014   .  Diabetes  09/03/2013   .  Obesity  09/03/2013   .  Vaginal itching  09/03/2013   .  Nicotine addiction  09/03/2013   .  DIABETIC PERIPHERAL NEUROPATHY  09/22/2010   .  TENOSYNOVITIS OF FOOT AND ANKLE  09/22/2010   .  FOOT PAIN, BILATERAL  09/22/2010   Plan:  Excision of right bartholin's gland cyst

## 2014-08-20 NOTE — Anesthesia Preprocedure Evaluation (Signed)
Anesthesia Evaluation  Patient identified by MRN, date of birth, ID band Patient awake    Reviewed: Allergy & Precautions, H&P , NPO status , Patient's Chart, lab work & pertinent test results  Airway Mallampati: II TM Distance: >3 FB     Dental  (+) Edentulous Upper, Partial Lower   Pulmonary former smoker,  breath sounds clear to auscultation        Cardiovascular hypertension, Rhythm:Regular Rate:Normal     Neuro/Psych    GI/Hepatic negative GI ROS,   Endo/Other  diabetes, Well Controlled, Type 2, Oral Hypoglycemic AgentsMorbid obesity  Renal/GU      Musculoskeletal  (+) Arthritis -,   Abdominal   Peds  Hematology  (+) anemia ,   Anesthesia Other Findings   Reproductive/Obstetrics                           Anesthesia Physical Anesthesia Plan  ASA: III  Anesthesia Plan: General   Post-op Pain Management:    Induction: Intravenous  Airway Management Planned: Oral ETT  Additional Equipment:   Intra-op Plan:   Post-operative Plan: Extubation in OR  Informed Consent: I have reviewed the patients History and Physical, chart, labs and discussed the procedure including the risks, benefits and alternatives for the proposed anesthesia with the patient or authorized representative who has indicated his/her understanding and acceptance.     Plan Discussed with:   Anesthesia Plan Comments:         Anesthesia Quick Evaluation

## 2014-08-20 NOTE — Anesthesia Postprocedure Evaluation (Signed)
  Anesthesia Post-op Note  Patient: Shawna Huerta  Procedure(s) Performed: Procedure(s): EXCISION OF RIGHT BARTHOLIN'S GLAND CYST (Right)  Patient Location: PACU  Anesthesia Type:General  Level of Consciousness: awake, oriented, sedated and patient cooperative  Airway and Oxygen Therapy: Patient Spontanous Breathing and Patient connected to face mask oxygen  Post-op Pain: mild  Post-op Assessment: Post-op Vital signs reviewed, Patient's Cardiovascular Status Stable, Respiratory Function Stable, Patent Airway, No signs of Nausea or vomiting and Pain level controlled  Post-op Vital Signs: Reviewed and stable  Last Vitals:  Filed Vitals:   08/20/14 0924  BP: 150/84  Pulse: 75  Temp: 36.6 C  Resp: 19    Complications: No apparent anesthesia complications

## 2014-08-20 NOTE — Op Note (Signed)
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 Endotracheal  Findings:  Patient has had this Bartholin gland cyst tissue for some time as is Alisa second time its come back.  She has been in antibiotics before and had an I&D with her work catheter put and to no avail.  It is quite large and uncomfortable with this point but not infected  Description of operation: Patient is taken to the operating room placed in supine position where she undergo general endotracheal anesthesia She's prepped and draped in usual sterile fashion placed in low lithotomy position The Bartholin gland cyst is identified An incision in the skin of the right vulva is made Sharp and blunt dissection is performed The Bartholin gland cyst is removed intact Of course is always there is a large amount of posterior bleeding from the posterior branch of the peroneal artery Is isolated and tied off The vulvar defect is quite large and closed in 5 layers of interrupted figure-of-eight sutures I used both 3-0 Monocryl and 0 Vicryl Bleeding was in addition the suture ligation The patient tolerated the procedure well She experienced 850 cc of blood loss She was taken to recovery in good stable condition All counts were correct She received Ancef prophylactically  Shawna Huerta H

## 2014-08-20 NOTE — Anesthesia Procedure Notes (Signed)
Procedure Name: Intubation Date/Time: 08/20/2014 10:15 AM Performed by: Andree Elk, AMY A Pre-anesthesia Checklist: Patient identified, Patient being monitored, Timeout performed, Emergency Drugs available and Suction available Patient Re-evaluated:Patient Re-evaluated prior to inductionOxygen Delivery Method: Circle System Utilized Preoxygenation: Pre-oxygenation with 100% oxygen Intubation Type: IV induction Ventilation: Mask ventilation without difficulty Laryngoscope Size: Miller and 3 Grade View: Grade I Tube type: Oral Tube size: 7.0 mm Number of attempts: 1 Airway Equipment and Method: stylet Placement Confirmation: ETT inserted through vocal cords under direct vision,  positive ETCO2 and breath sounds checked- equal and bilateral Secured at: 21 cm Tube secured with: Tape Dental Injury: Teeth and Oropharynx as per pre-operative assessment

## 2014-08-20 NOTE — Discharge Instructions (Signed)
Bartholin's Cyst or Abscess °Bartholin's glands are small glands located within the folds of skin (labia) along the sides of the lower opening of the vagina (birth canal). A cyst may develop when the duct of the gland becomes blocked. When this happens, fluid that accumulates within the cyst can become infected. This is known as an abscess. The Bartholin gland produces a mucous fluid to lubricate the outside of the vagina during sexual intercourse. °SYMPTOMS  °· Patients with a small cyst may not have any symptoms. °· Mild discomfort to severe pain depending on the size of the cyst and if it is infected (abscess). °· Pain, redness, and swelling around the lower opening of the vagina. °· Painful intercourse. °· Pressure in the perineal area. °· Swelling of the lips of the vagina (labia). °· The cyst or abscess can be on one side or both sides of the vagina. °DIAGNOSIS  °· A large swelling is seen in the lower vagina area by your caregiver. °· Painful to touch. °· Redness and pain, if it is an abscess. °TREATMENT  °· Sometimes the cyst will go away on its own. °· Apply warm wet compresses to the area or take hot sitz baths several times a day. °· An incision to drain the cyst or abscess with local anesthesia. °· Culture the pus, if it is an abscess. °· Antibiotic treatment, if it is an abscess. °· Cut open the gland and suture the edges to make the opening of the gland bigger (marsupialization). °· Remove the whole gland if the cyst or abscess returns. °PREVENTION  °· Practice good hygiene. °· Clean the vaginal area with a mild soap and soft cloth when bathing. °· Do not rub hard in the vaginal area when bathing. °· Protect the crotch area with a padded cushion if you take long bike rides or ride horses. °· Be sure you are well lubricated when you have sexual intercourse. °HOME CARE INSTRUCTIONS  °· If your cyst or abscess was opened, a small piece of gauze, or a drain, may have been placed in the wound to allow  drainage. Do not remove this gauze or drain unless directed by your caregiver. °· Wear feminine pads, not tampons, as needed for any drainage or bleeding. °· If antibiotics were prescribed, take them exactly as directed. Finish the entire course. °· Only take over-the-counter or prescription medicines for pain, discomfort, or fever as directed by your caregiver. °SEEK IMMEDIATE MEDICAL CARE IF:  °· You have an increase in pain, redness, swelling, or drainage. °· You have bleeding from the wound which results in the use of more than the number of pads suggested by your caregiver in 24 hours. °· You have chills. °· You have a fever. °· You develop any new problems (symptoms) or aggravation of your existing condition. °MAKE SURE YOU:  °· Understand these instructions. °· Will watch your condition. °· Will get help right away if you are not doing well or get worse. °Document Released: 11/14/2005 Document Revised: 02/06/2012 Document Reviewed: 07/02/2008 °[employer]® Patient Information ©2015 [employer]. This information is not intended to replace advice given to you by your health care provider. Make sure you discuss any questions you have with your health care provider. ° °

## 2014-08-20 NOTE — Transfer of Care (Signed)
Immediate Anesthesia Transfer of Care Note  Patient: Shawna Huerta  Procedure(s) Performed: Procedure(s): EXCISION OF RIGHT BARTHOLIN'S GLAND CYST (Right)  Patient Location: PACU  Anesthesia Type:General  Level of Consciousness: awake, oriented, sedated and patient cooperative  Airway & Oxygen Therapy: Patient Spontanous Breathing and Patient connected to face mask oxygen  Post-op Assessment: Report given to PACU RN and Post -op Vital signs reviewed and stable  Post vital signs: Reviewed and stable  Complications: No apparent anesthesia complications

## 2014-08-21 ENCOUNTER — Encounter (HOSPITAL_COMMUNITY): Payer: Self-pay | Admitting: Obstetrics & Gynecology

## 2014-08-22 DIAGNOSIS — Z029 Encounter for administrative examinations, unspecified: Secondary | ICD-10-CM

## 2014-08-27 ENCOUNTER — Encounter: Payer: Self-pay | Admitting: Obstetrics & Gynecology

## 2014-08-27 ENCOUNTER — Ambulatory Visit (INDEPENDENT_AMBULATORY_CARE_PROVIDER_SITE_OTHER): Payer: Managed Care, Other (non HMO) | Admitting: Obstetrics & Gynecology

## 2014-08-27 VITALS — BP 142/60 | Ht 67.0 in | Wt 266.0 lb

## 2014-08-27 DIAGNOSIS — Z9889 Other specified postprocedural states: Secondary | ICD-10-CM

## 2014-08-27 MED ORDER — CIPROFLOXACIN HCL 500 MG PO TABS: 500.0000 mg | ORAL_TABLET | Freq: Two times a day (BID) | ORAL | Status: DC

## 2014-08-27 MED ORDER — HYDROCODONE-ACETAMINOPHEN 5-325 MG PO TABS
1.0000 | ORAL_TABLET | Freq: Four times a day (QID) | ORAL | Status: DC | PRN
Start: 2014-08-27 — End: 2015-10-10

## 2014-08-27 NOTE — Progress Notes (Signed)
Patient ID: Shawna Huerta, female   DOB: 03/16/1961, 53 y.o.   MRN: [id number] 1 week post op removal of right Bartholin's gland cyst In today with tenderness but no other complaints  Exam Superficial exudate and swelling but healing well  Pathology benign 6.2 x 4.5 x 4.2 cm  Pt is a diabetic so will treat with ciprofloxacin 500 ID x 7days Refill hydrocodone

## 2014-09-04 ENCOUNTER — Other Ambulatory Visit: Payer: Managed Care, Other (non HMO) | Admitting: Adult Health

## 2014-09-24 ENCOUNTER — Encounter: Payer: Self-pay | Admitting: Obstetrics & Gynecology

## 2014-09-24 ENCOUNTER — Ambulatory Visit (INDEPENDENT_AMBULATORY_CARE_PROVIDER_SITE_OTHER): Payer: Managed Care, Other (non HMO) | Admitting: Obstetrics & Gynecology

## 2014-09-24 VITALS — BP 140/80 | Wt 273.0 lb

## 2014-09-24 DIAGNOSIS — N750 Cyst of Bartholin's gland: Secondary | ICD-10-CM

## 2014-09-24 DIAGNOSIS — Z9889 Other specified postprocedural states: Secondary | ICD-10-CM

## 2014-09-24 NOTE — Progress Notes (Signed)
Patient ID: Shawna Huerta, female   DOB: June 03, 1961, 53 y.o.   MRN: [id number] 5 weeks post op from excision of a Bartholin gland cyst Pt has done well post operatively  Surgical site healing well Avoid intercourse for 2 more weeks  Blood pressure 140/80, weight 273 lb (123.832 kg).  Follow up as needed or 1 year

## 2014-09-29 ENCOUNTER — Encounter: Payer: Self-pay | Admitting: Obstetrics & Gynecology

## 2014-10-13 ENCOUNTER — Other Ambulatory Visit: Payer: Managed Care, Other (non HMO) | Admitting: Adult Health

## 2015-03-17 ENCOUNTER — Other Ambulatory Visit (HOSPITAL_COMMUNITY): Payer: Self-pay | Admitting: Nurse Practitioner

## 2015-03-17 DIAGNOSIS — Z1231 Encounter for screening mammogram for malignant neoplasm of breast: Secondary | ICD-10-CM

## 2015-03-19 ENCOUNTER — Ambulatory Visit (HOSPITAL_COMMUNITY): Payer: Managed Care, Other (non HMO)

## 2015-04-14 ENCOUNTER — Emergency Department (HOSPITAL_COMMUNITY): Payer: BLUE CROSS/BLUE SHIELD

## 2015-04-14 ENCOUNTER — Emergency Department (HOSPITAL_COMMUNITY)
Admission: EM | Admit: 2015-04-14 | Discharge: 2015-04-14 | Disposition: A | Discharge status: Home / Self Care | Payer: BLUE CROSS/BLUE SHIELD | Attending: Emergency Medicine | Admitting: Emergency Medicine

## 2015-04-14 ENCOUNTER — Encounter (HOSPITAL_COMMUNITY): Payer: Self-pay | Admitting: Emergency Medicine

## 2015-04-14 DIAGNOSIS — Z8742 Personal history of other diseases of the female genital tract: Secondary | ICD-10-CM | POA: Insufficient documentation

## 2015-04-14 DIAGNOSIS — E119 Type 2 diabetes mellitus without complications: Secondary | ICD-10-CM | POA: Diagnosis not present

## 2015-04-14 DIAGNOSIS — R1084 Generalized abdominal pain: Secondary | ICD-10-CM | POA: Diagnosis present

## 2015-04-14 DIAGNOSIS — Z792 Long term (current) use of antibiotics: Secondary | ICD-10-CM | POA: Insufficient documentation

## 2015-04-14 DIAGNOSIS — Z872 Personal history of diseases of the skin and subcutaneous tissue: Secondary | ICD-10-CM | POA: Diagnosis not present

## 2015-04-14 DIAGNOSIS — R109 Unspecified abdominal pain: Secondary | ICD-10-CM

## 2015-04-14 DIAGNOSIS — E669 Obesity, unspecified: Secondary | ICD-10-CM | POA: Insufficient documentation

## 2015-04-14 DIAGNOSIS — Z862 Personal history of diseases of the blood and blood-forming organs and certain disorders involving the immune mechanism: Secondary | ICD-10-CM | POA: Insufficient documentation

## 2015-04-14 DIAGNOSIS — M1990 Unspecified osteoarthritis, unspecified site: Secondary | ICD-10-CM | POA: Diagnosis not present

## 2015-04-14 DIAGNOSIS — Z720 Tobacco use: Secondary | ICD-10-CM | POA: Insufficient documentation

## 2015-04-14 DIAGNOSIS — I10 Essential (primary) hypertension: Secondary | ICD-10-CM | POA: Diagnosis not present

## 2015-04-14 DIAGNOSIS — Z79899 Other long term (current) drug therapy: Secondary | ICD-10-CM | POA: Insufficient documentation

## 2015-04-14 DIAGNOSIS — K529 Noninfective gastroenteritis and colitis, unspecified: Secondary | ICD-10-CM | POA: Diagnosis not present

## 2015-04-14 LAB — CBC WITH DIFFERENTIAL/PLATELET
BASOS ABS: 0 10*3/uL (ref 0.0–0.1)
BASOS PCT: 0 % (ref 0–1)
EOS ABS: 0.1 10*3/uL (ref 0.0–0.7)
Eosinophils Relative: 1 % (ref 0–5)
HEMATOCRIT: 35.1 % — AB (ref 36.0–46.0)
Hemoglobin: 11.2 g/dL — ABNORMAL LOW (ref 12.0–15.0)
Lymphocytes Relative: 32 % (ref 12–46)
Lymphs Abs: 3.4 10*3/uL (ref 0.7–4.0)
MCH: 21.7 pg — ABNORMAL LOW (ref 26.0–34.0)
MCHC: 31.9 g/dL (ref 30.0–36.0)
MCV: 67.9 fL — AB (ref 78.0–100.0)
MONOS PCT: 6 % (ref 3–12)
Monocytes Absolute: 0.6 10*3/uL (ref 0.1–1.0)
Neutro Abs: 6.6 10*3/uL (ref 1.7–7.7)
Neutrophils Relative %: 62 % (ref 43–77)
PLATELETS: 269 10*3/uL (ref 150–400)
RBC: 5.17 MIL/uL — ABNORMAL HIGH (ref 3.87–5.11)
RDW: 17 % — ABNORMAL HIGH (ref 11.5–15.5)
WBC: 10.6 10*3/uL — ABNORMAL HIGH (ref 4.0–10.5)

## 2015-04-14 LAB — URINALYSIS, ROUTINE W REFLEX MICROSCOPIC
Bilirubin Urine: NEGATIVE
Glucose, UA: NEGATIVE mg/dL
Hgb urine dipstick: NEGATIVE
Ketones, ur: NEGATIVE mg/dL
Leukocytes, UA: NEGATIVE
NITRITE: NEGATIVE
PH: 6 (ref 5.0–8.0)
Protein, ur: NEGATIVE mg/dL
Urobilinogen, UA: 0.2 mg/dL (ref 0.0–1.0)

## 2015-04-14 LAB — COMPREHENSIVE METABOLIC PANEL
ALK PHOS: 60 U/L (ref 38–126)
ALT: 12 U/L — AB (ref 14–54)
AST: 12 U/L — AB (ref 15–41)
Albumin: 3.7 g/dL (ref 3.5–5.0)
Anion gap: 8 (ref 5–15)
BUN: 10 mg/dL (ref 6–20)
CO2: 26 mmol/L (ref 22–32)
Calcium: 9.4 mg/dL (ref 8.9–10.3)
Chloride: 104 mmol/L (ref 101–111)
Creatinine, Ser: 0.9 mg/dL (ref 0.44–1.00)
GFR calc Af Amer: >60 mL/min (ref >60)
GFR calc non Af Amer: >60 mL/min (ref >60)
GLUCOSE: 89 mg/dL (ref 65–99)
POTASSIUM: 4.8 mmol/L (ref 3.5–5.1)
SODIUM: 138 mmol/L (ref 135–145)
TOTAL PROTEIN: 8.4 g/dL — AB (ref 6.5–8.1)
Total Bilirubin: 0.6 mg/dL (ref 0.3–1.2)

## 2015-04-14 LAB — LIPASE, BLOOD: Lipase: 41 U/L (ref 22–51)

## 2015-04-14 LAB — CBG MONITORING, ED
GLUCOSE-CAPILLARY: 74 mg/dL (ref 65–99)
Glucose-Capillary: 45 mg/dL — ABNORMAL LOW (ref 65–99)

## 2015-04-14 MED ORDER — KETOROLAC TROMETHAMINE 30 MG/ML IJ SOLN
30.0000 mg | Freq: Once | INTRAMUSCULAR | Status: AC
  Administered 2015-04-14: 30 mg via INTRAVENOUS
  Filled 2015-04-14: qty 1

## 2015-04-14 MED ORDER — SODIUM CHLORIDE 0.9 % IV BOLUS (SEPSIS)
1000.0000 mL | Freq: Once | INTRAVENOUS | Status: AC
  Administered 2015-04-14: 1000 mL via INTRAVENOUS

## 2015-04-14 MED ORDER — ONDANSETRON 4 MG PO TBDP: ORAL_TABLET | ORAL | Status: DC

## 2015-04-14 MED ORDER — DEXTROSE 50 % IV SOLN
INTRAVENOUS | Status: AC
  Administered 2015-04-14: 25 g
  Filled 2015-04-14: qty 50

## 2015-04-14 MED ORDER — DICYCLOMINE HCL 20 MG PO TABS: ORAL_TABLET | ORAL | Status: DC

## 2015-04-14 MED ORDER — ONDANSETRON HCL 4 MG/2ML IJ SOLN
4.0000 mg | Freq: Once | INTRAMUSCULAR | Status: AC
  Administered 2015-04-14: 4 mg via INTRAVENOUS
  Filled 2015-04-14: qty 2

## 2015-04-14 NOTE — ED Notes (Signed)
Pt reports that she has had n/v/d for the past few days.  States that she was able to eat some toast and drink some juice this morning to take her medications.  States that at this time she is just sore across her abdomen and feels a little nauseated.

## 2015-04-14 NOTE — Discharge Instructions (Signed)
Drink plenty of fluids.  Follow up with your md this week as planned

## 2015-04-14 NOTE — ED Notes (Signed)
Pt called myself into room and stated that she felt her glucose was low.  Checked and noted to be 66.  MD made aware and pt given 1/2 amp D50.  Pt states she feels much better.

## 2015-04-14 NOTE — ED Provider Notes (Signed)
CSN: [id number]     Arrival date & time 04/14/15  1521 History   First MD Initiated Contact with Patient 04/14/15 1541     Chief Complaint  Patient presents with  . Abdominal Pain     (Consider location/radiation/quality/duration/timing/severity/associated sxs/prior Treatment) Patient is a 53 y.o. female presenting with abdominal pain. The history is provided by the patient (the pt complains of vomiting and diarrhea).  Abdominal Pain Pain location:  Generalized Pain quality: aching   Pain radiates to:  Does not radiate Pain severity:  Mild Onset quality:  Sudden Timing:  Intermittent Progression:  Waxing and waning Chronicity:  New Context: not alcohol use   Associated symptoms: diarrhea   Associated symptoms: no chest pain, no cough, no fatigue and no hematuria     Past Medical History  Diagnosis Date  . Diabetes mellitus   . Obesity   . Vaginal itching 09/03/2013  . Nicotine addiction 09/03/2013  . Bartholin cyst 07/03/2014  . Hypertension   . Anemia   . Arthritis    Past Surgical History  Procedure Laterality Date  . Abdominal surgery      exploratry with TAH  . Tonsillectomy    . Cholecystectomy    . Abdominal hysterectomy    . Foot surgery    . Foot surgery Right 2011    shave bone  . Bartholin gland cyst excision Right 08/20/2014    Procedure: EXCISION OF RIGHT BARTHOLIN'S GLAND CYST;  Surgeon: Florian Buff, MD;  Location: [facility];  Service: Gynecology;  Laterality: Right;   Family History  Problem Relation Age of Onset  . Diabetes Mother   . Stroke Mother   . Cancer Father   . Diabetes Sister   . Cancer Brother   . Diabetes Brother    History  Substance Use Topics  . Smoking status: Current Every Day Smoker -- 0.25 packs/day for 3 years    Types: Cigarettes  . Smokeless tobacco: Never Used  . Alcohol Use: No     Comment: occ.   OB History    Gravida Para Term Preterm AB TAB SAB Ectopic Multiple Living   2 1 1  1  1   1      Review of Systems   Constitutional: Negative for appetite change and fatigue.  HENT: Negative for congestion, ear discharge and sinus pressure.   Eyes: Negative for discharge.  Respiratory: Negative for cough.   Cardiovascular: Negative for chest pain.  Gastrointestinal: Positive for abdominal pain and diarrhea.  Genitourinary: Negative for frequency and hematuria.  Musculoskeletal: Negative for back pain.  Skin: Negative for rash.  Neurological: Negative for seizures and headaches.  Psychiatric/Behavioral: Negative for hallucinations.      Allergies  Iohexol  Home Medications   Prior to Admission medications   Medication Sig Start Date End Date Taking? Authorizing Provider  glyBURIDE-metformin (GLUCOVANCE) 5-500 MG per tablet Take 1 tablet by mouth 2 (two) times daily.   Yes Historical Provider, MD  lisinopril (PRINIVIL,ZESTRIL) 10 MG tablet Take 10 mg by mouth daily. 03/12/15  Yes Historical Provider, MD  benazepril-hydrochlorthiazide (LOTENSIN HCT) 20-12.5 MG per tablet Take 1 tablet by mouth daily. 07/01/14   Historical Provider, MD  ciprofloxacin (CIPRO) 500 MG tablet Take 1 tablet (500 mg total) by mouth 2 (two) times daily. 08/27/14   Florian Buff, MD  dicyclomine (BENTYL) 20 MG tablet Take one every 6 hours as needed for abd cramps 04/14/15   Milton Ferguson, MD  fluconazole (DIFLUCAN) 150 MG tablet  Take 1 now and 1 in 3 days if needed 07/03/14   Estill Dooms, NP  HYDROcodone-acetaminophen (NORCO/VICODIN) 5-325 MG per tablet Take 1 tablet by mouth every 6 (six) hours as needed. 08/20/14   Florian Buff, MD  HYDROcodone-acetaminophen (NORCO/VICODIN) 5-325 MG per tablet Take 1 tablet by mouth every 6 (six) hours as needed for moderate pain. 08/27/14   Florian Buff, MD  JANUVIA 100 MG tablet Take 1 tablet by mouth daily as needed (if glucose is above 120).  07/01/14   Historical Provider, MD  ketorolac (TORADOL) 10 MG tablet Take 1 tablet (10 mg total) by mouth every 8 (eight) hours as needed. 08/20/14    Florian Buff, MD  ondansetron (ZOFRAN ODT) 4 MG disintegrating tablet 4mg  ODT q4 hours prn nausea/vomit 04/14/15   Milton Ferguson, MD  ondansetron (ZOFRAN) 8 MG tablet Take 1 tablet (8 mg total) by mouth every 8 (eight) hours as needed for nausea. 08/20/14   Florian Buff, MD  tetrahydrozoline 0.05 % ophthalmic solution Place 1 drop into both eyes daily as needed (dry eyes).    Historical Provider, MD   BP 128/65 mmHg  Pulse 72  Temp(Src) 97.7 F (36.5 C) (Oral)  Resp 16  Ht 5\' 7"  (1.702 m)  Wt 270 lb (122.471 kg)  BMI 42.28 kg/m2  SpO2 100% Physical Exam  Constitutional: She is oriented to person, place, and time. She appears well-developed.  HENT:  Head: Normocephalic.  Eyes: Conjunctivae and EOM are normal. No scleral icterus.  Neck: Neck supple. No thyromegaly present.  Cardiovascular: Normal rate and regular rhythm.  Exam reveals no gallop and no friction rub.   No murmur heard. Pulmonary/Chest: No stridor. She has no wheezes. She has no rales. She exhibits no tenderness.  Abdominal: She exhibits no distension. There is tenderness. There is no rebound.  Mild tenderness throughout  Musculoskeletal: Normal range of motion. She exhibits no edema.  Lymphadenopathy:    She has no cervical adenopathy.  Neurological: She is oriented to person, place, and time. She exhibits normal muscle tone. Coordination normal.  Skin: No rash noted. No erythema.  Psychiatric: She has a normal mood and affect. Her behavior is normal.    ED Course  Procedures (including critical care time) Labs Review Labs Reviewed  COMPREHENSIVE METABOLIC PANEL - Abnormal; Notable for the following:    Total Protein 8.4 (*)    AST 12 (*)    ALT 12 (*)    All other components within normal limits  CBC WITH DIFFERENTIAL/PLATELET - Abnormal; Notable for the following:    WBC 10.6 (*)    RBC 5.17 (*)    Hemoglobin 11.2 (*)    HCT 35.1 (*)    MCV 67.9 (*)    MCH 21.7 (*)    RDW 17.0 (*)    All other  components within normal limits  URINALYSIS, ROUTINE W REFLEX MICROSCOPIC - Abnormal; Notable for the following:    Specific Gravity, Urine >1.030 (*)    All other components within normal limits  CBG MONITORING, ED - Abnormal; Notable for the following:    Glucose-Capillary 45 (*)    All other components within normal limits  LIPASE, BLOOD  CBG MONITORING, ED    Imaging Review Ct Abdomen Pelvis Wo Contrast  04/14/2015   CLINICAL DATA:  Nausea, vomiting, diarrhea and lower abdominal pain.  EXAM: CT ABDOMEN AND PELVIS WITHOUT CONTRAST  TECHNIQUE: Multidetector CT imaging of the abdomen and pelvis was performed following  the standard protocol without IV contrast.  COMPARISON:  Prior study on 08/05/2013  FINDINGS: Unenhanced appearance of the liver, pancreas, spleen, adrenal glands and kidneys are unremarkable. The gallbladder has been removed. No evidence of biliary ductal dilatation.  Bowel loops are unremarkable in appearance and show no evidence of obstruction or inflammation. No free fluid, free air or abscess is identified. The bladder is unremarkable in appearance. The uterus has been removed. No abnormal calcifications are seen. No hernias, masses or enlarged lymph nodes are identified. Bony structures are unremarkable. Visualized lung bases are normal.  IMPRESSION: Normal CT of the abdomen and pelvis without contrast.   Electronically Signed   By: Aletta Edouard M.D.   On: 04/14/2015 17:51     EKG Interpretation None      MDM   Final diagnoses:  Abdominal pain  Gastroenteritis    rx gastroenteritis with zofran, bentyl and follow up    Milton Ferguson, MD 04/14/15 1948

## 2015-04-14 NOTE — ED Notes (Addendum)
PT c/o n/v/d since 04/11/15 with lower abdominal pain starting yesterday. PT denies any recent antibiotic therapy and denies any urinary symptoms.

## 2015-08-29 DIAGNOSIS — N764 Abscess of vulva: Secondary | ICD-10-CM

## 2015-08-29 DIAGNOSIS — K529 Noninfective gastroenteritis and colitis, unspecified: Secondary | ICD-10-CM

## 2015-08-29 HISTORY — DX: Noninfective gastroenteritis and colitis, unspecified: K52.9

## 2015-08-29 HISTORY — DX: Abscess of vulva: N76.4

## 2015-09-29 DIAGNOSIS — K859 Acute pancreatitis without necrosis or infection, unspecified: Secondary | ICD-10-CM

## 2015-09-29 HISTORY — DX: Acute pancreatitis without necrosis or infection, unspecified: K85.90

## 2015-10-10 ENCOUNTER — Inpatient Hospital Stay (HOSPITAL_COMMUNITY)
Admission: EM | Admit: 2015-10-10 | Discharge: 2015-10-12 | DRG: 439 | Disposition: A | Discharge status: Home / Self Care | Payer: BLUE CROSS/BLUE SHIELD | Attending: Internal Medicine | Admitting: Internal Medicine

## 2015-10-10 ENCOUNTER — Encounter (HOSPITAL_COMMUNITY): Payer: Self-pay | Admitting: Emergency Medicine

## 2015-10-10 ENCOUNTER — Inpatient Hospital Stay (HOSPITAL_COMMUNITY): Payer: BLUE CROSS/BLUE SHIELD

## 2015-10-10 ENCOUNTER — Emergency Department (HOSPITAL_COMMUNITY): Payer: BLUE CROSS/BLUE SHIELD

## 2015-10-10 DIAGNOSIS — R1110 Vomiting, unspecified: Secondary | ICD-10-CM | POA: Diagnosis not present

## 2015-10-10 DIAGNOSIS — E1165 Type 2 diabetes mellitus with hyperglycemia: Secondary | ICD-10-CM

## 2015-10-10 DIAGNOSIS — E871 Hypo-osmolality and hyponatremia: Secondary | ICD-10-CM

## 2015-10-10 DIAGNOSIS — Z833 Family history of diabetes mellitus: Secondary | ICD-10-CM

## 2015-10-10 DIAGNOSIS — N764 Abscess of vulva: Secondary | ICD-10-CM | POA: Diagnosis present

## 2015-10-10 DIAGNOSIS — Z823 Family history of stroke: Secondary | ICD-10-CM

## 2015-10-10 DIAGNOSIS — N750 Cyst of Bartholin's gland: Secondary | ICD-10-CM

## 2015-10-10 DIAGNOSIS — I10 Essential (primary) hypertension: Secondary | ICD-10-CM | POA: Diagnosis present

## 2015-10-10 DIAGNOSIS — Z809 Family history of malignant neoplasm, unspecified: Secondary | ICD-10-CM

## 2015-10-10 DIAGNOSIS — D649 Anemia, unspecified: Secondary | ICD-10-CM | POA: Diagnosis present

## 2015-10-10 DIAGNOSIS — E119 Type 2 diabetes mellitus without complications: Secondary | ICD-10-CM | POA: Diagnosis present

## 2015-10-10 DIAGNOSIS — F17210 Nicotine dependence, cigarettes, uncomplicated: Secondary | ICD-10-CM | POA: Diagnosis present

## 2015-10-10 DIAGNOSIS — R112 Nausea with vomiting, unspecified: Secondary | ICD-10-CM | POA: Diagnosis not present

## 2015-10-10 DIAGNOSIS — N898 Other specified noninflammatory disorders of vagina: Secondary | ICD-10-CM

## 2015-10-10 DIAGNOSIS — K859 Acute pancreatitis without necrosis or infection, unspecified: Secondary | ICD-10-CM | POA: Diagnosis present

## 2015-10-10 DIAGNOSIS — R52 Pain, unspecified: Secondary | ICD-10-CM

## 2015-10-10 DIAGNOSIS — E669 Obesity, unspecified: Secondary | ICD-10-CM

## 2015-10-10 DIAGNOSIS — E1169 Type 2 diabetes mellitus with other specified complication: Secondary | ICD-10-CM

## 2015-10-10 DIAGNOSIS — Z720 Tobacco use: Secondary | ICD-10-CM

## 2015-10-10 DIAGNOSIS — K589 Irritable bowel syndrome without diarrhea: Secondary | ICD-10-CM | POA: Diagnosis not present

## 2015-10-10 DIAGNOSIS — R197 Diarrhea, unspecified: Secondary | ICD-10-CM | POA: Diagnosis not present

## 2015-10-10 HISTORY — DX: Abscess of vulva: N76.4

## 2015-10-10 HISTORY — DX: Noninfective gastroenteritis and colitis, unspecified: K52.9

## 2015-10-10 LAB — CBC WITH DIFFERENTIAL/PLATELET
BASOS ABS: 0 10*3/uL (ref 0.0–0.1)
Basophils Relative: 0 %
Eosinophils Absolute: 0 10*3/uL (ref 0.0–0.7)
Eosinophils Relative: 0 %
HEMATOCRIT: 38.7 % (ref 36.0–46.0)
Hemoglobin: 12.8 g/dL (ref 12.0–15.0)
LYMPHS ABS: 1.7 10*3/uL (ref 0.7–4.0)
Lymphocytes Relative: 10 %
MCH: 22.1 pg — AB (ref 26.0–34.0)
MCHC: 33.1 g/dL (ref 30.0–36.0)
MCV: 66.7 fL — AB (ref 78.0–100.0)
Monocytes Absolute: 0.9 10*3/uL (ref 0.1–1.0)
Monocytes Relative: 5 %
NEUTROS ABS: 15 10*3/uL — AB (ref 1.7–7.7)
Neutrophils Relative %: 85 %
Platelets: 320 10*3/uL (ref 150–400)
RBC: 5.8 MIL/uL — AB (ref 3.87–5.11)
RDW: 16.1 % — ABNORMAL HIGH (ref 11.5–15.5)
WBC: 17.5 10*3/uL — AB (ref 4.0–10.5)

## 2015-10-10 LAB — GLUCOSE, CAPILLARY
GLUCOSE-CAPILLARY: 246 mg/dL — AB (ref 65–99)
Glucose-Capillary: 165 mg/dL — ABNORMAL HIGH (ref 65–99)

## 2015-10-10 LAB — URINALYSIS, ROUTINE W REFLEX MICROSCOPIC
Bilirubin Urine: NEGATIVE
Glucose, UA: 500 mg/dL — AB
Ketones, ur: NEGATIVE mg/dL
Leukocytes, UA: NEGATIVE
Nitrite: NEGATIVE
Specific Gravity, Urine: >1.03 — ABNORMAL HIGH (ref 1.005–1.030)
Urobilinogen, UA: 0.2 mg/dL (ref 0.0–1.0)
pH: 6 (ref 5.0–8.0)

## 2015-10-10 LAB — COMPREHENSIVE METABOLIC PANEL
ALK PHOS: 73 U/L (ref 38–126)
ALT: 21 U/L (ref 14–54)
AST: 26 U/L (ref 15–41)
Albumin: 4.1 g/dL (ref 3.5–5.0)
Anion gap: 11 (ref 5–15)
BILIRUBIN TOTAL: 0.5 mg/dL (ref 0.3–1.2)
BUN: 10 mg/dL (ref 6–20)
CALCIUM: 9.4 mg/dL (ref 8.9–10.3)
CO2: 25 mmol/L (ref 22–32)
CREATININE: 0.83 mg/dL (ref 0.44–1.00)
Chloride: 91 mmol/L — ABNORMAL LOW (ref 101–111)
Glucose, Bld: 330 mg/dL — ABNORMAL HIGH (ref 65–99)
Potassium: 3.7 mmol/L (ref 3.5–5.1)
Sodium: 127 mmol/L — ABNORMAL LOW (ref 135–145)
Total Protein: 9.5 g/dL — ABNORMAL HIGH (ref 6.5–8.1)

## 2015-10-10 LAB — CBG MONITORING, ED: Glucose-Capillary: 309 mg/dL — ABNORMAL HIGH (ref 65–99)

## 2015-10-10 LAB — URINE MICROSCOPIC-ADD ON

## 2015-10-10 LAB — LIPASE, BLOOD: LIPASE: 656 U/L — AB (ref 11–51)

## 2015-10-10 MED ORDER — METOPROLOL TARTRATE 1 MG/ML IV SOLN: 5.0000 mg | INTRAVENOUS | Status: DC | PRN

## 2015-10-10 MED ORDER — PROMETHAZINE HCL 12.5 MG PO TABS
12.5000 mg | ORAL_TABLET | Freq: Four times a day (QID) | ORAL | Status: DC | PRN
Start: 2015-10-10 — End: 2015-10-12

## 2015-10-10 MED ORDER — HYDROMORPHONE HCL 1 MG/ML IJ SOLN
1.0000 mg | Freq: Once | INTRAMUSCULAR | Status: AC
  Administered 2015-10-10: 1 mg via INTRAVENOUS
  Filled 2015-10-10: qty 1

## 2015-10-10 MED ORDER — HYDRALAZINE HCL 20 MG/ML IJ SOLN: 5.0000 mg | INTRAMUSCULAR | Status: DC | PRN

## 2015-10-10 MED ORDER — ONDANSETRON HCL 4 MG/2ML IJ SOLN
4.0000 mg | Freq: Four times a day (QID) | INTRAMUSCULAR | Status: DC
  Administered 2015-10-10 – 2015-10-12 (×8): 4 mg via INTRAVENOUS
  Filled 2015-10-10 (×8): qty 2

## 2015-10-10 MED ORDER — ACETAMINOPHEN 325 MG PO TABS: 650.0000 mg | ORAL_TABLET | Freq: Four times a day (QID) | ORAL | Status: DC | PRN

## 2015-10-10 MED ORDER — CLONIDINE HCL 0.1 MG/24HR TD PTWK
0.1000 mg | MEDICATED_PATCH | TRANSDERMAL | Status: DC
  Administered 2015-10-10: 0.1 mg via TRANSDERMAL
  Filled 2015-10-10: qty 1

## 2015-10-10 MED ORDER — INSULIN ASPART 100 UNIT/ML ~~LOC~~ SOLN
0.0000 [IU] | SUBCUTANEOUS | Status: DC
  Administered 2015-10-10: 2 [IU] via SUBCUTANEOUS
  Administered 2015-10-11: 3 [IU] via SUBCUTANEOUS

## 2015-10-10 MED ORDER — HYDROMORPHONE HCL 1 MG/ML IJ SOLN
0.5000 mg | Freq: Once | INTRAMUSCULAR | Status: AC
  Administered 2015-10-10: 0.5 mg via INTRAVENOUS
  Filled 2015-10-10: qty 1

## 2015-10-10 MED ORDER — ONDANSETRON HCL 4 MG/2ML IJ SOLN
4.0000 mg | Freq: Three times a day (TID) | INTRAMUSCULAR | Status: DC | PRN
  Administered 2015-10-10: 4 mg via INTRAVENOUS
  Filled 2015-10-10: qty 2

## 2015-10-10 MED ORDER — SODIUM CHLORIDE 0.9 % IV SOLN
INTRAVENOUS | Status: AC
  Administered 2015-10-10: 18:00:00 via INTRAVENOUS

## 2015-10-10 MED ORDER — ACETAMINOPHEN 650 MG RE SUPP: 650.0000 mg | Freq: Four times a day (QID) | RECTAL | Status: DC | PRN

## 2015-10-10 MED ORDER — SODIUM CHLORIDE 0.9 % IV BOLUS (SEPSIS)
1000.0000 mL | Freq: Once | INTRAVENOUS | Status: AC
  Administered 2015-10-10: 1000 mL via INTRAVENOUS

## 2015-10-10 MED ORDER — POTASSIUM CHLORIDE IN NACL 20-0.9 MEQ/L-% IV SOLN
INTRAVENOUS | Status: DC
  Administered 2015-10-10: 19:00:00 via INTRAVENOUS

## 2015-10-10 MED ORDER — HYDROMORPHONE HCL 1 MG/ML IJ SOLN
1.0000 mg | INTRAMUSCULAR | Status: DC | PRN
  Administered 2015-10-10 – 2015-10-12 (×8): 1 mg via INTRAVENOUS
  Filled 2015-10-10 (×8): qty 1

## 2015-10-10 MED ORDER — ONDANSETRON HCL 4 MG/2ML IJ SOLN
4.0000 mg | Freq: Once | INTRAMUSCULAR | Status: AC
  Administered 2015-10-10: 4 mg via INTRAVENOUS
  Filled 2015-10-10: qty 2

## 2015-10-10 MED ORDER — ENOXAPARIN SODIUM 40 MG/0.4ML ~~LOC~~ SOLN
40.0000 mg | SUBCUTANEOUS | Status: DC
  Administered 2015-10-10 – 2015-10-11 (×2): 40 mg via SUBCUTANEOUS
  Filled 2015-10-10 (×2): qty 0.4

## 2015-10-10 MED ORDER — PANTOPRAZOLE SODIUM 40 MG IV SOLR
40.0000 mg | INTRAVENOUS | Status: DC
  Administered 2015-10-10 – 2015-10-11 (×2): 40 mg via INTRAVENOUS
  Filled 2015-10-10 (×2): qty 40

## 2015-10-10 MED ORDER — ALBUTEROL SULFATE (2.5 MG/3ML) 0.083% IN NEBU: 2.5000 mg | INHALATION_SOLUTION | RESPIRATORY_TRACT | Status: DC | PRN

## 2015-10-10 NOTE — ED Provider Notes (Signed)
CSN: [id number]     Arrival date & time 10/10/15  1306 History   First MD Initiated Contact with Patient 10/10/15 1332     Chief Complaint  Patient presents with  . Emesis  . Diarrhea  . Migraine     (Consider location/radiation/quality/duration/timing/severity/associated sxs/prior Treatment) HPI Comments: The patient is a 54 year old female, she states that she was diagnosed with colitis a couple of months ago by her family doctor who admitted her to [facility], states that she did well and improved however her symptoms seem to recur about a week after they resolved and she has to go back on antibiotics. She was placed on Cipro and Flagyl after being seen at her doctor's office yesterday for projectile vomiting and diarrhea. She states that the vomiting is multiple times a day. The diarrhea is multiple times a day and appears watery. There is no blood, she states the diarrhea is watery and mucus containing. She has no fevers or chills, denies back pain or swelling of the legs. Her blood sugars remained in a normal range the last 2 days despite this illness.  Patient is a 54 y.o. female presenting with vomiting, diarrhea, and migraines. The history is provided by the patient.  Emesis Associated symptoms: diarrhea   Diarrhea Associated symptoms: vomiting   Migraine    Past Medical History  Diagnosis Date  . Diabetes mellitus   . Obesity   . Vaginal itching 09/03/2013  . Nicotine addiction 09/03/2013  . Bartholin cyst 07/03/2014  . Hypertension   . Anemia   . Arthritis    Past Surgical History  Procedure Laterality Date  . Abdominal surgery      exploratry with TAH  . Tonsillectomy    . Cholecystectomy    . Abdominal hysterectomy    . Foot surgery    . Foot surgery Right 2011    shave bone  . Bartholin gland cyst excision Right 08/20/2014    Procedure: EXCISION OF RIGHT BARTHOLIN'S GLAND CYST;  Surgeon: Florian Buff, MD;  Location: [facility];  Service: Gynecology;  Laterality:  Right;   Family History  Problem Relation Age of Onset  . Diabetes Mother   . Stroke Mother   . Cancer Father   . Diabetes Sister   . Cancer Brother   . Diabetes Brother    Social History  Substance Use Topics  . Smoking status: Current Every Day Smoker -- 0.25 packs/day for 3 years    Types: Cigarettes  . Smokeless tobacco: Never Used  . Alcohol Use: No     Comment: occ.   OB History    Gravida Para Term Preterm AB TAB SAB Ectopic Multiple Living   2 1 1  1  1   1      Review of Systems  Gastrointestinal: Positive for vomiting and diarrhea.  All other systems reviewed and are negative.     Allergies  Iohexol  Home Medications   Prior to Admission medications   Medication Sig Start Date End Date Taking? Authorizing Provider  benazepril-hydrochlorthiazide (LOTENSIN HCT) 20-12.5 MG per tablet Take 1 tablet by mouth daily. 07/01/14  Yes Historical Provider, MD  bismuth subsalicylate (PEPTO BISMOL) 262 MG chewable tablet Chew 524 mg by mouth as needed.   Yes Historical Provider, MD  dicyclomine (BENTYL) 20 MG tablet Take one every 6 hours as needed for abd cramps 04/14/15  Yes Milton Ferguson, MD  glyBURIDE-metformin (GLUCOVANCE) 5-500 MG per tablet Take 1 tablet by mouth 2 (two) times  daily.   Yes Historical Provider, MD  HYDROcodone-acetaminophen (NORCO/VICODIN) 5-325 MG per tablet Take 1 tablet by mouth every 6 (six) hours as needed. 08/20/14  Yes Florian Buff, MD  lisinopril (PRINIVIL,ZESTRIL) 10 MG tablet Take 10 mg by mouth daily. 03/12/15  Yes Historical Provider, MD  fluconazole (DIFLUCAN) 150 MG tablet Take 1 now and 1 in 3 days if needed Patient not taking: Reported on 10/10/2015 07/03/14   Estill Dooms, NP  ondansetron (ZOFRAN ODT) 4 MG disintegrating tablet 4mg  ODT q4 hours prn nausea/vomit Patient not taking: Reported on 10/10/2015 04/14/15   Milton Ferguson, MD  ondansetron [facility]) 4 MG tablet take 1 tablet by mouth three times a day if needed for nausea 10/09/15    Historical Provider, MD   BP 172/85 mmHg  Pulse 76  Temp(Src) 97.5 F (36.4 C) (Oral)  Resp 16  Ht 5\' 7"  (1.702 m)  Wt 270 lb (122.471 kg)  BMI 42.28 kg/m2  SpO2 94% Physical Exam  Constitutional: She appears well-developed and well-nourished. She appears distressed.  HENT:  Head: Normocephalic and atraumatic.  Mouth/Throat: Oropharynx is clear and moist. No oropharyngeal exudate.  Eyes: Conjunctivae and EOM are normal. Pupils are equal, round, and reactive to light. Right eye exhibits no discharge. Left eye exhibits no discharge. No scleral icterus.  Neck: Normal range of motion. Neck supple. No JVD present. No thyromegaly present.  Cardiovascular: Normal rate, regular rhythm, normal heart sounds and intact distal pulses.  Exam reveals no gallop and no friction rub.   No murmur heard. Pulmonary/Chest: Effort normal and breath sounds normal. No respiratory distress. She has no wheezes. She has no rales.  Abdominal: Soft. Bowel sounds are normal. She exhibits no distension and no mass. There is tenderness (diffuse abdominal tenderness, no guarding or peritoneal signs).  Musculoskeletal: Normal range of motion. She exhibits no edema or tenderness.  Lymphadenopathy:    She has no cervical adenopathy.  Neurological: She is alert. Coordination normal.  Skin: Skin is warm and dry. No rash noted. No erythema.  Psychiatric: She has a normal mood and affect. Her behavior is normal.  Nursing note and vitals reviewed.   ED Course  Procedures (including critical care time) Labs Review Labs Reviewed  CBC WITH DIFFERENTIAL/PLATELET - Abnormal; Notable for the following:    WBC 17.5 (*)    RBC 5.80 (*)    MCV 66.7 (*)    MCH 22.1 (*)    RDW 16.1 (*)    Neutro Abs 15.0 (*)    All other components within normal limits  COMPREHENSIVE METABOLIC PANEL - Abnormal; Notable for the following:    Sodium 127 (*)    Chloride 91 (*)    Glucose, Bld 330 (*)    Total Protein 9.5 (*)    All other  components within normal limits  URINALYSIS, ROUTINE W REFLEX MICROSCOPIC (NOT AT [facility]) - Abnormal; Notable for the following:    APPearance CLOUDY (*)    Specific Gravity, Urine >1.030 (*)    Glucose, UA 500 (*)    Hgb urine dipstick MODERATE (*)    Protein, ur >300 (*)    All other components within normal limits  LIPASE, BLOOD - Abnormal; Notable for the following:    Lipase 656 (*)    All other components within normal limits  URINE MICROSCOPIC-ADD ON - Abnormal; Notable for the following:    Squamous Epithelial / LPF FEW (*)    Bacteria, UA FEW (*)    All other  components within normal limits  CBG MONITORING, ED - Abnormal; Notable for the following:    Glucose-Capillary 309 (*)    All other components within normal limits    Imaging Review Dg Abd Acute W/chest  10/10/2015  CLINICAL DATA:  Vomiting, diarrhea, headache EXAM: DG ABDOMEN ACUTE W/ 1V CHEST COMPARISON:  CT abdomen pelvis dated 04/14/2015 FINDINGS: Lungs are clear.  No pleural effusion or pneumothorax. The heart is top-normal in size. Nonobstructive bowel gas pattern. No evidence of free air under the diaphragm on the upright view. Cholecystectomy clips. Mild degenerative changes of the lower lumbar spine. IMPRESSION: No evidence of acute cardiopulmonary disease. No evidence of small bowel obstruction or free air. Electronically Signed   By: Julian Hy M.D.   On: 10/10/2015 14:24   I have personally reviewed and evaluated these images and lab results as part of my medical decision-making.    MDM   Final diagnoses:  Acute pancreatitis, unspecified complication status, unspecified pancreatitis type    The patient appears very uncomfortable, she is hypertensive but not tachycardic or febrile. She has had a recent diagnosis of colitis, she was told it was not ulcerative colitis, she does not have blood containing stools. We'll obtain labs and an acute abdominal series, IV fluids, symptomatically care with Zofran  and pain medication. The patient is agreeable to this workup.  The pt has had VS showing some hypertension  She has had meds with some improvement but labs show first time pancreatitis - no ETOH and no gall bladder - will be admitted with ongoing pain  D/w Dr. Nehemiah Settle with hospitalist who will admit.  I have personally viewed and interpreted the imaging and agree with radiologist interpretation.  Meds given in ED:  Medications  sodium chloride 0.9 % bolus 1,000 mL (1,000 mLs Intravenous New Bag/Given 10/10/15 1350)  ondansetron (ZOFRAN) injection 4 mg (4 mg Intravenous Given 10/10/15 1350)  HYDROmorphone (DILAUDID) injection 1 mg (1 mg Intravenous Given 10/10/15 1350)  ondansetron (ZOFRAN) injection 4 mg (4 mg Intravenous Given 10/10/15 1545)  HYDROmorphone (DILAUDID) injection 0.5 mg (0.5 mg Intravenous Given 10/10/15 1548)       Noemi Chapel, MD 10/10/15 [phone]

## 2015-10-10 NOTE — H&P (Signed)
[facility] History and Physical  SUKHMAN OKRAY [id number] DOB: 01/11/1961 DOA: 10/10/2015  Referring physician: ED PHYSICIAN, DR. Sabra Heck PCP: Glenda Chroman., MD   Chief Complaint: Abdominal pain, diarrhea, nausea/vomiting.  HPI: Shawna Huerta is a 54 y.o. female with a history of cholecystectomy, hypertension, diabetes mellitus, anemia, and tobacco use. Her history is also significant for abdominal pain and diarrhea which led to a recent diagnosis of colitis approximately 2 months ago, treated with Cipro and Flagyl for 2 weeks. Following the treatment with Cipro and Flagyl, the patient improved, but her symptoms returned. She was briefly hospitalized at [facility] where she was treated again with 1 more week of Cipro and Flagyl. She also had a recent I&D of a labial abscess by general surgeon, Dr. Ladona Horns in [place]. It is healing well with dressing changes. Approximately 3 days ago, she began to have subjective fever, chills, lower abdominal pain, diarrhea, nausea, and vomiting. Her abdominal pain is described as throbbing and sharp. It starts over her lower abdomen and radiates to the epigastric region and to the back. She rates the eighth 7-8/10 in intensity. She has averaged 5-6 watery bowel movements daily over the past 3 days. She denies black tarry stools or bright red blood per rectum. She has had 6 or 7 episodes of vomiting over the past 24-48 hours. She denies coffee grounds emesis. She has also developed a global headache with no associated weakness, numbness, tingling, or dizziness. Otherwise, she denies chest pain, shortness of breath, pain with urination, leg swelling.  In the ED, she is afebrile and moderately hypertensive with a blood pressure in the [id number] to [id number] systolically. She is not tachycardic. Her acute abdominal series revealed no cardiopulmonary disease and no evidence of SBO or free air. Her lab data are significant for a lipase of 656, normal LFTs, glucose of 330,  sodium of 127, a urinalysis 3-6 WBCs, few bacteria, but no nitrite. She is being admitted for further evaluation and management.     Review of Systems:  As above in history present illness; otherwise negative.  Past Medical History  Diagnosis Date  . Diabetes mellitus   . Obesity   . Vaginal itching 09/03/2013  . Nicotine addiction 09/03/2013  . Bartholin cyst 07/03/2014  . Hypertension   . Anemia   . Arthritis   . Colitis 08/2015    Treated at [facility]  . Labial abscess 08/2015    Status post I&D by Dr. Ladona Horns   Past Surgical History  Procedure Laterality Date  . Abdominal surgery      exploratry with TAH  . Tonsillectomy    . Cholecystectomy    . Abdominal hysterectomy    . Foot surgery    . Foot surgery Right 2011    shave bone  . Bartholin gland cyst excision Right 08/20/2014    Procedure: EXCISION OF RIGHT BARTHOLIN'S GLAND CYST;  Surgeon: Florian Buff, MD;  Location: [facility];  Service: Gynecology;  Laterality: Right;   Social History: She is single. She has one son. She smokes 3-4 cigarettes per day. She denies alcohol and illicit drug use. She is employed full-time.    Allergies  Allergen Reactions  . Iohexol      Code: HIVES, Desc: PT STATES THE EYES SWOLL AND ITCHED, NEEDS PRE MEDS     Family History  Problem Relation Age of Onset  . Diabetes Mother   . Stroke Mother   . Cancer Father   . Diabetes Sister   .  Cancer Brother   . Diabetes Brother      Prior to Admission medications   Medication Sig Start Date End Date Taking? Authorizing Provider  benazepril-hydrochlorthiazide (LOTENSIN HCT) 20-12.5 MG per tablet Take 1 tablet by mouth daily. 07/01/14  Yes Historical Provider, MD  bismuth subsalicylate (PEPTO BISMOL) 262 MG chewable tablet Chew 524 mg by mouth as needed.   Yes Historical Provider, MD  dicyclomine (BENTYL) 20 MG tablet Take one every 6 hours as needed for abd cramps 04/14/15  Yes Milton Ferguson, MD  glyBURIDE-metformin (GLUCOVANCE)  5-500 MG per tablet Take 1 tablet by mouth 2 (two) times daily.   Yes Historical Provider, MD  HYDROcodone-acetaminophen (NORCO/VICODIN) 5-325 MG per tablet Take 1 tablet by mouth every 6 (six) hours as needed. 08/20/14  Yes Florian Buff, MD  lisinopril (PRINIVIL,ZESTRIL) 10 MG tablet Take 10 mg by mouth daily. 03/12/15  Yes Historical Provider, MD  fluconazole (DIFLUCAN) 150 MG tablet Take 1 now and 1 in 3 days if needed Patient not taking: Reported on 10/10/2015 07/03/14   Estill Dooms, NP  ondansetron (ZOFRAN ODT) 4 MG disintegrating tablet 4mg  ODT q4 hours prn nausea/vomit Patient not taking: Reported on 10/10/2015 04/14/15   Milton Ferguson, MD  ondansetron [facility]) 4 MG tablet take 1 tablet by mouth three times a day if needed for nausea 10/09/15   Historical Provider, MD   Physical Exam: Filed Vitals:   10/10/15 1312 10/10/15 1552 10/10/15 1725  BP: 191/90 172/85 169/81  Pulse: 65 76 79  Temp: 97.5 F (36.4 C)  98.8 F (37.1 C)  TempSrc: Oral  Oral  Resp: 24 16 18   Height: 5\' 7"  (1.702 m)  5\' 7"  (1.702 m)  Weight: 122.471 kg (270 lb)  117.4 kg (258 lb 13.1 oz)  SpO2: 98% 94% 98%    Wt Readings from Last 3 Encounters:  10/10/15 117.4 kg (258 lb 13.1 oz)  04/14/15 122.471 kg (270 lb)  09/24/14 123.832 kg (273 lb)    General:  Appears calm and comfortable; pleasant 54 year old obese African-American woman who appears ill, but in no acute distress. Eyes: PERRL, normal lids, irises & conjunctiva; conjunctivae are clear and sclerae are white. ENT: grossly normal hearing; oropharynx reveals mildly dry mucous membranes; no posterior pharyngeal exudates or erythema. Neck: no LAD, masses or thyromegaly Cardiovascular: S1, S2, with a soft systolic murmur. No pedal edema. Telemetry: Not applicable  Respiratory: CTA bilaterally, no w/r/r. Normal respiratory effort. Abdomen: Obese, hypoactive bowel sounds, soft, mildly to moderately tender diffusely, but with more tenderness in the  epigastrium and the left lower quadrant; no masses palpated; no distention. GU:  Left labia with mild induration and small amount of packing inserted in the previous I&D site. No active drainage or surrounding erythema. The site appears to be closing by secondary intention. Skin: no rash or induration seen on limited exam Musculoskeletal: grossly normal tone BUE/BLE; no acute hot red joints. Psychiatric: grossly normal mood and affect, speech fluent and appropriate Neurologic: grossly non-focal; cranial nerves II through XII are intact.           Labs on Admission:  Basic Metabolic Panel:  Recent Labs Lab 10/10/15 1346  NA 127*  K 3.7  CL 91*  CO2 25  GLUCOSE 330*  BUN 10  CREATININE 0.83  CALCIUM 9.4   Liver Function Tests:  Recent Labs Lab 10/10/15 1346  AST 26  ALT 21  ALKPHOS 73  BILITOT 0.5  PROT 9.5*  ALBUMIN 4.1  Recent Labs Lab 10/10/15 1346  LIPASE 656*   No results for input(s): AMMONIA in the last 168 hours. CBC:  Recent Labs Lab 10/10/15 1346  WBC 17.5*  NEUTROABS 15.0*  HGB 12.8  HCT 38.7  MCV 66.7*  PLT 320   Cardiac Enzymes: No results for input(s): CKTOTAL, CKMB, CKMBINDEX, TROPONINI in the last 168 hours.  BNP (last 3 results) No results for input(s): BNP in the last 8760 hours.  ProBNP (last 3 results) No results for input(s): PROBNP in the last 8760 hours.  CBG:  Recent Labs Lab 10/10/15 1355 10/10/15 1653  GLUCAP 309* 246*    Radiological Exams on Admission: Dg Abd Acute W/chest  10/10/2015  CLINICAL DATA:  Vomiting, diarrhea, headache EXAM: DG ABDOMEN ACUTE W/ 1V CHEST COMPARISON:  CT abdomen pelvis dated 04/14/2015 FINDINGS: Lungs are clear.  No pleural effusion or pneumothorax. The heart is top-normal in size. Nonobstructive bowel gas pattern. No evidence of free air under the diaphragm on the upright view. Cholecystectomy clips. Mild degenerative changes of the lower lumbar spine. IMPRESSION: No evidence of acute  cardiopulmonary disease. No evidence of small bowel obstruction or free air. Electronically Signed   By: Julian Hy M.D.   On: 10/10/2015 14:24    EKG: Independently reviewed. Not applicable  Assessment/Plan Principal Problem:   Acute pancreatitis Active Problems:   Diarrhea   Nausea and vomiting   Type 2 diabetes mellitus without complication (HCC)   Labial abscess   Hyponatremia   Essential hypertension   Obesity   Tobacco abuse   1. Acute pancreatitis. On admission, the patient's lipase is 656. She has had a cholecystectomy. She denies alcohol use. Her LFTs are unremarkable. Etiology of her acute pancreatitis is unknown at this time. For further evaluation, we will order a fasting lipid profile and CT scan of the abdomen and pelvis with oral contrast only. She will be made to be nothing by mouth. We'll treat her pain with as needed IV hydromorphone. We'll treat her nausea with scheduled Zofran every 6 hours and as needed IV Phenergan. Will add IV Protonix empirically. Will start vigorous IV fluids. 2. Abdominal pain with diarrhea, nausea, and vomiting. Her GI symptoms could be the consequence of acute pancreatitis, but she does have some lower abdominal discomfort on exam. Per her history, she was treated 2 months ago and again a couple weeks ago for colitis. She was treated with Cipro and Flagyl on both occasions. For evaluation, a CT scan of her abdomen and pelvis will be ordered. We will order C. difficile PCR and cryptosporidium/giardia assay. Will provide supportive treatment as above in #1. Will request records from [facility]. 3. Recent labial abscess. The patient had an I&D of a small labial abscess last week, performed by general surgeon, Dr. Ladona Horns in [place]. The area does not look actively infected. Will provide supportive wound care and dressing changes. 4. Hyponatremia. The patient's serum sodium is 127 on admission. This is likely secondary to  hypovolemia/dehydration from vomiting and diarrhea. We'll start vigorous IV fluids with normal saline. 5. Type 2 diabetes mellitus. The patient is treated chronically with Glucovance. This will be held and her diabetes will be treated with sliding scale NovoLog. Hemoglobin A1c will be ordered. 6. Essential hypertension. The patient is treated chronically with the benazepril/HCTZ. Her blood pressure is moderately elevated. While she is nothing by mouth, will treat her hypertension with clonidine patch and when necessary IV metoprolol and hydralazine. 7. Tobacco use. The patient was encouraged  to stop smoking altogether.    Code Status: Full code DVT Prophylaxis: Lovenox Family Communication: Discussed with patient and her son Disposition Plan: Anticipate discharge to home in 3-4 days or sooner.  Time spent: One hour.  [facility] Pager [phone]

## 2015-10-10 NOTE — ED Notes (Signed)
Pt went to PCP yesterday for projectile vomiting and diarrhea. Pt placed on Cipro and Flagyl. In today with same complaints along with headache.

## 2015-10-11 DIAGNOSIS — R112 Nausea with vomiting, unspecified: Secondary | ICD-10-CM

## 2015-10-11 DIAGNOSIS — E669 Obesity, unspecified: Secondary | ICD-10-CM

## 2015-10-11 LAB — GLUCOSE, CAPILLARY
GLUCOSE-CAPILLARY: 169 mg/dL — AB (ref 65–99)
GLUCOSE-CAPILLARY: 140 mg/dL — AB (ref 65–99)
GLUCOSE-CAPILLARY: 113 mg/dL — AB (ref 65–99)
GLUCOSE-CAPILLARY: 144 mg/dL — AB (ref 65–99)
Glucose-Capillary: 149 mg/dL — ABNORMAL HIGH (ref 65–99)

## 2015-10-11 LAB — CBC
HEMATOCRIT: 34.5 % — AB (ref 36.0–46.0)
Hemoglobin: 11.1 g/dL — ABNORMAL LOW (ref 12.0–15.0)
MCH: 21.5 pg — ABNORMAL LOW (ref 26.0–34.0)
MCHC: 32.2 g/dL (ref 30.0–36.0)
MCV: 66.7 fL — AB (ref 78.0–100.0)
PLATELETS: 267 10*3/uL (ref 150–400)
RBC: 5.17 MIL/uL — AB (ref 3.87–5.11)
RDW: 15.9 % — ABNORMAL HIGH (ref 11.5–15.5)
WBC: 12.4 10*3/uL — AB (ref 4.0–10.5)

## 2015-10-11 LAB — LIPID PANEL
Cholesterol: 128 mg/dL (ref 0–200)
HDL: 41 mg/dL (ref >40)
LDL CALC: 67 mg/dL (ref 0–99)
Total CHOL/HDL Ratio: 3.1 RATIO
Triglycerides: 102 mg/dL (ref <150)
VLDL: 20 mg/dL (ref 0–40)

## 2015-10-11 LAB — HEPATIC FUNCTION PANEL
ALT: 17 U/L (ref 14–54)
AST: 13 U/L — AB (ref 15–41)
Albumin: 3.3 g/dL — ABNORMAL LOW (ref 3.5–5.0)
Alkaline Phosphatase: 54 U/L (ref 38–126)
BILIRUBIN DIRECT: 0.2 mg/dL (ref 0.1–0.5)
BILIRUBIN INDIRECT: 0.4 mg/dL (ref 0.3–0.9)
TOTAL PROTEIN: 7.5 g/dL (ref 6.5–8.1)
Total Bilirubin: 0.6 mg/dL (ref 0.3–1.2)

## 2015-10-11 LAB — BASIC METABOLIC PANEL
ANION GAP: 7 (ref 5–15)
BUN: 13 mg/dL (ref 6–20)
CALCIUM: 8.4 mg/dL — AB (ref 8.9–10.3)
CO2: 24 mmol/L (ref 22–32)
Chloride: 104 mmol/L (ref 101–111)
Creatinine, Ser: 0.77 mg/dL (ref 0.44–1.00)
Glucose, Bld: 143 mg/dL — ABNORMAL HIGH (ref 65–99)
POTASSIUM: 4 mmol/L (ref 3.5–5.1)
Sodium: 135 mmol/L (ref 135–145)

## 2015-10-11 LAB — C DIFFICILE QUICK SCREEN W PCR REFLEX
C DIFFICILE (CDIFF) INTERP: NEGATIVE
C DIFFICILE (CDIFF) TOXIN: NEGATIVE
C Diff antigen: NEGATIVE

## 2015-10-11 LAB — LIPASE, BLOOD: Lipase: 95 U/L — ABNORMAL HIGH (ref 11–51)

## 2015-10-11 LAB — TSH: TSH: 2.02 u[IU]/mL (ref 0.350–4.500)

## 2015-10-11 MED ORDER — SODIUM CHLORIDE 0.9 % IV SOLN
INTRAVENOUS | Status: DC
  Administered 2015-10-11 – 2015-10-12 (×3): via INTRAVENOUS

## 2015-10-11 MED ORDER — INSULIN ASPART 100 UNIT/ML ~~LOC~~ SOLN
0.0000 [IU] | SUBCUTANEOUS | Status: DC
  Administered 2015-10-11 – 2015-10-12 (×6): 1 [IU] via SUBCUTANEOUS

## 2015-10-11 MED ORDER — BENAZEPRIL HCL 10 MG PO TABS
20.0000 mg | ORAL_TABLET | Freq: Every day | ORAL | Status: DC
Start: 2015-10-11 — End: 2015-10-12
  Administered 2015-10-11 – 2015-10-12 (×2): 20 mg via ORAL
  Filled 2015-10-11 (×2): qty 2

## 2015-10-11 NOTE — Progress Notes (Signed)
Utilization review Completed Rajeev Escue RN BSN   

## 2015-10-11 NOTE — Progress Notes (Signed)
[facility] PROGRESS NOTE  NIEASHA MCAULAY [id number] DOB: May 13, 1961 DOA: 10/10/2015 PCP: Glenda Chroman., MD    Code Status: Full code Family Communication: Discussed with son on 10/10/15 Disposition Plan: Discharged home in 24-48 hours   Consultants:  None  Procedures:  None  Antibiotics:  None  HPI/Subjective: Patient says that her abdominal pain has subsided, but has not completely resolved. She has less nausea and no vomiting. She has not had a bowel movement since admission.  Objective: Filed Vitals:   10/11/15 0443  BP: 117/56  Pulse: 72  Temp: 98 F (36.7 C)  Resp: 20   oxygen saturation 100%  Intake/Output Summary (Last 24 hours) at 10/11/15 1043 Last data filed at 10/10/15 1800  Gross per 24 hour  Intake  18.33 ml  Output      0 ml  Net  18.33 ml   Filed Weights   10/10/15 1312 10/10/15 1725 10/11/15 0600  Weight: 122.471 kg (270 lb) 117.4 kg (258 lb 13.1 oz) 117.391 kg (258 lb 12.8 oz)    Exam:   General:  Pleasant obese African-American woman in no acute distress.  Cardiovascular: S1, S2, with a soft systolic murmur.  Respiratory: Decreased breath sounds in the bases, otherwise clear.  Abdomen: Hyperactive bowel sounds, soft, mild tenderness in the hypogastrium and epigastrium without guarding or distention.  GU. Right labial incision site without significant erythema; no purulent drainage; minimal tenderness.  Musculoskeletal/extremities: No pedal edema. No acute hot red joints.    Data Reviewed: Basic Metabolic Panel:  Recent Labs Lab 10/10/15 1346 10/11/15 0555  NA 127* 135  K 3.7 4.0  CL 91* 104  CO2 25 24  GLUCOSE 330* 143*  BUN 10 13  CREATININE 0.83 0.77  CALCIUM 9.4 8.4*   Liver Function Tests:  Recent Labs Lab 10/10/15 1346 10/11/15 0555  AST 26 13*  ALT 21 17  ALKPHOS 73 54  BILITOT 0.5 0.6  PROT 9.5* 7.5  ALBUMIN 4.1 3.3*    Recent Labs Lab 10/10/15 1346 10/11/15 0555  LIPASE 656* 95*   No  results for input(s): AMMONIA in the last 168 hours. CBC:  Recent Labs Lab 10/10/15 1346 10/11/15 0555  WBC 17.5* 12.4*  NEUTROABS 15.0*  --   HGB 12.8 11.1*  HCT 38.7 34.5*  MCV 66.7* 66.7*  PLT 320 267   Cardiac Enzymes: No results for input(s): CKTOTAL, CKMB, CKMBINDEX, TROPONINI in the last 168 hours. BNP (last 3 results) No results for input(s): BNP in the last 8760 hours.  ProBNP (last 3 results) No results for input(s): PROBNP in the last 8760 hours.  CBG:  Recent Labs Lab 10/10/15 1355 10/10/15 1653 10/10/15 2052 10/11/15 0415 10/11/15 0740  GLUCAP 309* 246* 165* 169* 149*    No results found for this or any previous visit (from the past 240 hour(s)).   Studies: Ct Abdomen Pelvis Wo Contrast  10/11/2015  CLINICAL DATA:  Diarrhea.  Intermittent sharp abdominal pain EXAM: CT ABDOMEN AND PELVIS WITHOUT CONTRAST TECHNIQUE: Multidetector CT imaging of the abdomen and pelvis was performed following the standard protocol without IV contrast. COMPARISON:  04/14/2015 FINDINGS: Lower chest and abdominal wall: Nonspecific subcutaneous fat inflammation in the left groin extending towards the perineum. No evidence of fluid collection. Hepatobiliary: No focal liver abnormality.Cholecystectomy with normal common bile duct diameter. Pancreas: Unremarkable. Spleen: Unremarkable. Adrenals/Urinary Tract: Negative adrenals. No hydronephrosis or stone. Symmetric mild perinephric edema, nonspecific. Unremarkable bladder. Reproductive:Hysterectomy with negative adnexa. Stomach/Bowel: No obstruction. No appendicitis. No colitis  changes. Vascular/Lymphatic: No acute vascular abnormality. No mass or adenopathy. Peritoneal: No ascites or pneumoperitoneum. Musculoskeletal: Prominent T12-L1 posterior annular ossification but no associated high-grade canal stenosis. No acute osseous finding. IMPRESSION: 1. Negative intra-abdominal imaging. 2. Subcutaneous inflammation in the low left groin,  correlate for cellulitis. Electronically Signed   By: Monte Fantasia M.D.   On: 10/11/2015 00:06   Dg Abd Acute W/chest  10/10/2015  CLINICAL DATA:  Vomiting, diarrhea, headache EXAM: DG ABDOMEN ACUTE W/ 1V CHEST COMPARISON:  CT abdomen pelvis dated 04/14/2015 FINDINGS: Lungs are clear.  No pleural effusion or pneumothorax. The heart is top-normal in size. Nonobstructive bowel gas pattern. No evidence of free air under the diaphragm on the upright view. Cholecystectomy clips. Mild degenerative changes of the lower lumbar spine. IMPRESSION: No evidence of acute cardiopulmonary disease. No evidence of small bowel obstruction or free air. Electronically Signed   By: Julian Hy M.D.   On: 10/10/2015 14:24    Scheduled Meds: . cloNIDine  0.1 mg Transdermal Weekly  . enoxaparin (LOVENOX) injection  40 mg Subcutaneous Q24H  . insulin aspart  0-9 Units Subcutaneous 6 times per day  . ondansetron (ZOFRAN) IV  4 mg Intravenous 4 times per day  . pantoprazole (PROTONIX) IV  40 mg Intravenous Q24H   Continuous Infusions: . 0.9 % NaCl with KCl 20 mEq / L 175 mL/hr at 10/10/15 1922   Assessment and plan:  Principal Problem:   Acute pancreatitis Active Problems:   Diarrhea   Nausea and vomiting   Type 2 diabetes mellitus without complication (HCC)   Labial abscess   Hyponatremia   Essential hypertension   Obesity   Tobacco abuse    Acute pancreatitis.  On admission, the patient's lipase was 656. Her white blood cell count was 17.5. She has had a cholecystectomy. She denied alcohol use. Her LFTs were unremarkable. -She was started on scheduled IV Zofran, scheduled IV Protonix, and when necessary IV Phenergan. She was made to be virtually nothing by mouth. Vigorous IV fluids were started. -Diagnostic studies were ordered. CT scan of her abdomen/pelvis with oral contrast revealed an unremarkable pancreas and no acute intra-abdominal findings. Her triglyceride level was within normal  limits. -Her lipase has improved significantly. Her white blood cell count has improved. Will try clear liquids and advance as tolerated. -Etiology of her pancreatitis is unknown, possibly idiopathic. Abdominal pain with diarrhea, nausea, and vomiting.   Per her history, she was treated 2 months ago and again a couple weeks ago for colitis. She was treated with Cipro and Flagyl on both occasions.  -CT scan of the abdomen and pelvis revealed no evidence of colitis changes. C. difficile PCR and cryptosporidium/urinary assay were ordered. Patient has not had a bowel movement since admission.  -We'll continue supportive treatment, analgesics, and antiemetics. Recent labial abscess.  The patient had an I&D of a small labial abscess last week, performed by general surgeon, Dr. Ladona Horns in [place]. The area does not look actively infected.  -CT of her pelvis revealed subcutaneous inflammation in the low left groin, but no obvious abscess.  -Will provide supportive wound care and dressing changes. Hyponatremia.  The patient's serum sodium is 127 on admission. This was likely secondary to hypovolemia/dehydration from vomiting and diarrhea. She was started on vigorous IV fluids with improvement in her serum sodium. Type 2 diabetes mellitus.  The patient is treated chronically with Glucovance. Glucovance was withheld. Sliding scale NovoLog was started. Her CBGs have improved. Hemoglobin A1c is pending.  Essential hypertension. The patient is treated chronically with the benazepril/HCTZ and ? Lisinopril. Her blood pressure was moderately elevated on admission. Her oral antihypertensive medication was withheld and she was started on a clonidine patch and as needed IV metoprolol and hydralazine. -Her blood pressure has improved. -Her restart benazepril only and discontinued clonidine patch. Tobacco use. The patient was encouraged to stop smoking altogether.    Time spent: 35 minutes    Pablo Pena  [facility] Pager [phone]. If 7PM-7AM, please contact night-coverage at [email or web address], password [email or web address] 10/11/2015, 10:43 AM  LOS: 1 day

## 2015-10-12 ENCOUNTER — Inpatient Hospital Stay (HOSPITAL_COMMUNITY): Payer: BLUE CROSS/BLUE SHIELD

## 2015-10-12 DIAGNOSIS — K589 Irritable bowel syndrome without diarrhea: Secondary | ICD-10-CM | POA: Diagnosis present

## 2015-10-12 DIAGNOSIS — K859 Acute pancreatitis without necrosis or infection, unspecified: Principal | ICD-10-CM

## 2015-10-12 DIAGNOSIS — N764 Abscess of vulva: Secondary | ICD-10-CM

## 2015-10-12 LAB — LIPASE, BLOOD: Lipase: 30 U/L (ref 11–51)

## 2015-10-12 LAB — CBC
HCT: 32.9 % — ABNORMAL LOW (ref 36.0–46.0)
HEMOGLOBIN: 10.5 g/dL — AB (ref 12.0–15.0)
MCH: 21.6 pg — AB (ref 26.0–34.0)
MCHC: 31.9 g/dL (ref 30.0–36.0)
MCV: 67.8 fL — ABNORMAL LOW (ref 78.0–100.0)
Platelets: 195 10*3/uL (ref 150–400)
RBC: 4.85 MIL/uL (ref 3.87–5.11)
RDW: 16.3 % — ABNORMAL HIGH (ref 11.5–15.5)
WBC: 8.1 10*3/uL (ref 4.0–10.5)

## 2015-10-12 LAB — GLUCOSE, CAPILLARY
GLUCOSE-CAPILLARY: 116 mg/dL — AB (ref 65–99)
GLUCOSE-CAPILLARY: 121 mg/dL — AB (ref 65–99)
Glucose-Capillary: 134 mg/dL — ABNORMAL HIGH (ref 65–99)
Glucose-Capillary: 130 mg/dL — ABNORMAL HIGH (ref 65–99)
Glucose-Capillary: 118 mg/dL — ABNORMAL HIGH (ref 65–99)

## 2015-10-12 LAB — BASIC METABOLIC PANEL
ANION GAP: 4 — AB (ref 5–15)
BUN: 11 mg/dL (ref 6–20)
CHLORIDE: 108 mmol/L (ref 101–111)
CO2: 26 mmol/L (ref 22–32)
Calcium: 8.2 mg/dL — ABNORMAL LOW (ref 8.9–10.3)
Creatinine, Ser: 0.74 mg/dL (ref 0.44–1.00)
GFR calc Af Amer: >60 mL/min (ref >60)
GFR calc non Af Amer: >60 mL/min (ref >60)
Glucose, Bld: 129 mg/dL — ABNORMAL HIGH (ref 65–99)
POTASSIUM: 3.8 mmol/L (ref 3.5–5.1)
SODIUM: 138 mmol/L (ref 135–145)

## 2015-10-12 LAB — HEMOGLOBIN A1C
Hgb A1c MFr Bld: 9.1 % — ABNORMAL HIGH (ref 4.8–5.6)
MEAN PLASMA GLUCOSE: 214 mg/dL

## 2015-10-12 MED ORDER — DICYCLOMINE HCL 10 MG PO CAPS
10.0000 mg | ORAL_CAPSULE | Freq: Three times a day (TID) | ORAL | Status: DC
Start: 2015-10-12 — End: 2015-10-12
  Administered 2015-10-12 (×2): 10 mg via ORAL
  Filled 2015-10-12 (×2): qty 1

## 2015-10-12 MED ORDER — PANTOPRAZOLE SODIUM 40 MG PO TBEC
40.0000 mg | DELAYED_RELEASE_TABLET | Freq: Every day | ORAL | Status: DC
  Administered 2015-10-12: 40 mg via ORAL
  Filled 2015-10-12: qty 1

## 2015-10-12 MED ORDER — HYDROCODONE-ACETAMINOPHEN 5-325 MG PO TABS: 1.0000 | ORAL_TABLET | Freq: Four times a day (QID) | ORAL | Status: DC | PRN

## 2015-10-12 MED ORDER — HYOSCYAMINE SULFATE 0.125 MG/ML PO SOLN
0.2500 mg | Freq: Three times a day (TID) | ORAL | Status: DC
  Filled 2015-10-12 (×7): qty 2

## 2015-10-12 MED ORDER — ALBUTEROL SULFATE (2.5 MG/3ML) 0.083% IN NEBU: 2.5000 mg | INHALATION_SOLUTION | RESPIRATORY_TRACT | Status: DC | PRN

## 2015-10-12 MED ORDER — DICYCLOMINE HCL 10 MG PO CAPS: 10.0000 mg | ORAL_CAPSULE | Freq: Three times a day (TID) | ORAL | Status: DC

## 2015-10-12 NOTE — Progress Notes (Signed)
Patient states understanding of discharge instructions, prescriptions given 

## 2015-10-12 NOTE — Care Management Note (Signed)
Case Management Note  Patient Details  Name: Shawna Huerta MRN: [id number] Date of Birth: Jul 24, 1961  Subjective/Objective:                  Pt is from home, lives with her son and is ind with ADL's. Pt admitted for pancreatitis.   Action/Plan: Pt plans to return home with self care at DC. No CM needs anticipated.   Expected Discharge Date:    10/14/2015              Expected Discharge Plan:  Home/Self Care  In-House Referral:  NA  Discharge planning Services  CM Consult  Post Acute Care Choice:  NA Choice offered to:  NA  DME Arranged:    DME Agency:     HH Arranged:    HH Agency:     Status of Service:  Completed, signed off  Medicare Important Message Given:    Date Medicare IM Given:    Medicare IM give by:    Date Additional Medicare IM Given:    Additional Medicare Important Message give by:     If discussed at [place] of Stay Meetings, dates discussed:    Additional Comments:  Sherald Barge, RN 10/12/2015, 2:04 PM

## 2015-10-12 NOTE — Discharge Summary (Signed)
Physician Discharge Summary  [name] [id number] DOB: Apr 09, 1961 DOA: 10/10/2015  PCP: Glenda Chroman., MD  Admit date: 10/10/2015 Discharge date: 10/12/2015  Time spent: Greater than 30  minutes  Recommendations for Outpatient Follow-up:  1. Patient will follow-up with gastroenterologist, Dr. Britta Mccreedy as scheduled next month.    Discharge Diagnoses:  1. Acute pancreatitis of unknown etiology. 2. Abdominal pain with diarrhea, nausea, and vomiting. 3. Possible irritable bowel syndrome. 4. Recent labial abscess, resolved. 5. Hyponatremia secondary to hypovolemia. 6. Type 2 diabetes mellitus. 7. Tobacco abuse. The patient was advised to stop smoking. 8. Hypertension. 9. Obesity.    Discharge Condition: Improved.  Diet recommendation: Heart healthy/carbohydrate modified.  Filed Weights   10/10/15 1725 10/11/15 0600 10/12/15 0427  Weight: 117.4 kg (258 lb 13.1 oz) 117.391 kg (258 lb 12.8 oz) 119.432 kg (263 lb 4.8 oz)    History of present illness:   Shawna Huerta is a 54 y.o. female with a history of cholecystectomy, hypertension, diabetes mellitus, anemia, and tobacco use. Her history is also significant for abdominal pain and diarrhea which led to a recent diagnosis of colitis approximately 2 months ago, treated with Cipro and Flagyl for 2 weeks. Following the treatment with Cipro and Flagyl, she improved, but her symptoms returned. She was briefly hospitalized at [facility] where she was treated again with 1 more week of Cipro and Flagyl. She also had a recent I&D of a labial abscess by general surgeon, Dr. Ladona Horns in [place].  Approximately 3 days ago, she began to have subjective fever, chills, lower abdominal pain, diarrhea, nausea, and vomiting. Her abdominal pain was described as throbbing and sharp. It started over her lower abdomen and radiated to the epigastric region and to the back. She rated the pain 7-8/10 in intensity. She had averaged 5-6 watery bowel movements  daily. She denied black tarry stools or bright red blood per rectum. She had 6 or 7 episodes of vomiting. She denied coffee grounds emesis. She developed a global headache with no associated weakness, numbness, tingling, or dizziness. Otherwise, she denied chest pain, shortness of breath, pain with urination, leg swelling. In the ED, she was afebrile and moderately hypertensive with a blood pressure in the [id number] to [id number] systolically. She was not tachycardic. Her acute abdominal series revealed no cardiopulmonary disease and no evidence of SBO or free air. Her lab data were significant for a lipase of 656, normal LFTs, glucose of 330, sodium of 127, a urinalysis 3-6 WBCs, few bacteria, but no nitrite. She was admitted for further evaluation and management.  Hospital Course:  Acute pancreatitis.  On admission, the patient's lipase was 656. Her white blood cell count was 17.5. She has had a cholecystectomy. She denied alcohol use. Her LFTs were unremarkable. -She was started on scheduled IV Zofran, scheduled IV Protonix, and when necessary IV Phenergan. She was made to be virtually nothing by mouth. Vigorous IV fluids were started. -Diagnostic studies were ordered. CT scan of her abdomen/pelvis with oral contrast revealed an unremarkable pancreas and no acute intra-abdominal findings. Her triglyceride level was within normal limits. -Her lipase and white blood cell count completely normalized. Her diet was advanced which she tolerated well. -Etiology of her pancreatitis was unknown, it was possibly idiopathic. Abdominal pain with diarrhea, nausea, and vomiting; possible IBS. Per her history, she was treated 2 months ago and again a couple weeks ago for colitis. She was treated with Cipro and Flagyl on both occasions. -Patient was started on treatment with  analgesics and antiemetics as needed. IV fluids were started. -CT scan of the abdomen and pelvis revealed no evidence of colitis changes. C. difficile  PCR was negative. -Because of her intermittent crampy abdominal pain over her lower pelvis, ultrasound of her pelvis was ordered.It revealed post hysterectomy with nonvisualization of the ovaries and no focal abnormality. -Patient's diarrhea, nausea, and vomiting resolved, but she still continued to have intermittent crampy abdominal pain. She may have IBS, and therefore, Bentyl was started. She has a gastroenterology appointment in a few weeks in [place] with gastroenterologist, Dr. Britta Mccreedy. She was encouraged to keep the appointment. Recent labial abscess.  The patient had an I&D of a small labial abscess last week, performed by general surgeon, Dr. Ladona Horns in [place]. The area did not look actively infected.  -CT of her pelvis revealed subcutaneous inflammation in the low left groin, but no obvious abscess.  Hyponatremia.  The patient's serum sodium is 127 on admission. This was likely secondary to hypovolemia/dehydration from vomiting and diarrhea. She was started on vigorous IV fluids with improvement in her serum sodium. Type 2 diabetes mellitus.  The patient is treated chronically with Glucovance. Glucovance was withheld. Sliding scale NovoLog was started. Her CBGs improved. Hemoglobin A1c was 9.1. Further management will be deferred to her PCP. Essential hypertension.   Her blood pressure was moderately elevated on admission. Her oral antihypertensive medication was withheld and she was started on a clonidine patch and as needed IV metoprolol and hydralazine, while she was nothing by mouth.  -Her blood pressure improved and her home antihypertensive medication was restarted.  Tobacco use. The patient was encouraged to stop smoking altogether.  Procedures:  None   Consultations:  None   Discharge Exam: Filed Vitals:   10/12/15 1322  BP: 138/68  Pulse: 84  Temp: 98.1 F (36.7 C)  Resp: 20    General: Pleasant obese African-American woman in no acute distress.  Cardiovascular:  S1, S2, with a soft systolic murmur.  Respiratory: Decreased breath sounds in the bases, otherwise clear.  Abdomen: Hyperactive bowel sounds, soft, mild tenderness over the lower abdomen/suprapubic area without distention or masses palpated.  GU. Right labial incision site without significant erythema; no purulent drainage; minimal tenderness.  Musculoskeletal/extremities: No pedal edema. No acute hot red joints.   Discharge Instructions   Discharge Instructions    Diet - low sodium heart healthy    Complete by:  As directed      Diet Carb Modified    Complete by:  As directed      Discharge instructions    Complete by:  As directed   Follow a low fat diet and try to avoid fried foods.     Increase activity slowly    Complete by:  As directed           Current Discharge Medication List    START taking these medications   Details  dicyclomine (BENTYL) 10 MG capsule Take 1 capsule (10 mg total) by mouth 4 (four) times daily -  before meals and at bedtime. Qty: 60 capsule, Refills: 0      CONTINUE these medications which have CHANGED   Details  HYDROcodone-acetaminophen (NORCO/VICODIN) 5-325 MG tablet Take 1 tablet by mouth every 6 (six) hours as needed. Qty: 30 tablet, Refills: 0      CONTINUE these medications which have NOT CHANGED   Details  glyBURIDE-metformin (GLUCOVANCE) 5-500 MG per tablet Take 1 tablet by mouth 2 (two) times daily.    lisinopril (  PRINIVIL,ZESTRIL) 10 MG tablet Take 10 mg by mouth daily. Refills: 1    ondansetron (ZOFRAN) 4 MG tablet take 1 tablet by mouth three times a day if needed for nausea Refills: 0      STOP taking these medications     benazepril-hydrochlorthiazide (LOTENSIN HCT) 20-12.5 MG per tablet      bismuth subsalicylate (PEPTO BISMOL) 262 MG chewable tablet      dicyclomine (BENTYL) 20 MG tablet      fluconazole (DIFLUCAN) 150 MG tablet      ondansetron (ZOFRAN ODT) 4 MG disintegrating tablet        Allergies   Allergen Reactions  . Iohexol      Code: HIVES, Desc: PT STATES THE EYES SWOLL AND ITCHED, NEEDS PRE MEDS       The results of significant diagnostics from this hospitalization (including imaging, microbiology, ancillary and laboratory) are listed below for reference.    Significant Diagnostic Studies: Ct Abdomen Pelvis Wo Contrast  10/11/2015  CLINICAL DATA:  Diarrhea.  Intermittent sharp abdominal pain EXAM: CT ABDOMEN AND PELVIS WITHOUT CONTRAST TECHNIQUE: Multidetector CT imaging of the abdomen and pelvis was performed following the standard protocol without IV contrast. COMPARISON:  04/14/2015 FINDINGS: Lower chest and abdominal wall: Nonspecific subcutaneous fat inflammation in the left groin extending towards the perineum. No evidence of fluid collection. Hepatobiliary: No focal liver abnormality.Cholecystectomy with normal common bile duct diameter. Pancreas: Unremarkable. Spleen: Unremarkable. Adrenals/Urinary Tract: Negative adrenals. No hydronephrosis or stone. Symmetric mild perinephric edema, nonspecific. Unremarkable bladder. Reproductive:Hysterectomy with negative adnexa. Stomach/Bowel: No obstruction. No appendicitis. No colitis changes. Vascular/Lymphatic: No acute vascular abnormality. No mass or adenopathy. Peritoneal: No ascites or pneumoperitoneum. Musculoskeletal: Prominent T12-L1 posterior annular ossification but no associated high-grade canal stenosis. No acute osseous finding. IMPRESSION: 1. Negative intra-abdominal imaging. 2. Subcutaneous inflammation in the low left groin, correlate for cellulitis. Electronically Signed   By: Monte Fantasia M.D.   On: 10/11/2015 00:06   US Pelvis Complete  10/12/2015  CLINICAL DATA:  Mid pelvic pain, intermittent diarrhea for 3 months EXAM: TRANSABDOMINAL ULTRASOUND OF PELVIS TECHNIQUE: Transabdominal ultrasound examination of the pelvis was performed including evaluation of the uterus, ovaries, adnexal regions, and pelvic cul-de-sac.  COMPARISON:  CT abdomen and pelvis 10/10/2015 FINDINGS: Uterus Surgically absent 2005 Endometrium N/A Right ovary Not visualized question due to surgical absence versus obscuration by bowel Left ovary Not visualized question due to surgical absence versus obscuration by bowel Other findings: No free pelvic fluid. No adnexal masses. Bladder partially distended, grossly unremarkable. IMPRESSION: Post hysterectomy with nonvisualization of ovaries. No focal abnormalities identified. Electronically Signed   By: Lavonia Dana M.D.   On: 10/12/2015 15:16   Dg Abd Acute W/chest  10/10/2015  CLINICAL DATA:  Vomiting, diarrhea, headache EXAM: DG ABDOMEN ACUTE W/ 1V CHEST COMPARISON:  CT abdomen pelvis dated 04/14/2015 FINDINGS: Lungs are clear.  No pleural effusion or pneumothorax. The heart is top-normal in size. Nonobstructive bowel gas pattern. No evidence of free air under the diaphragm on the upright view. Cholecystectomy clips. Mild degenerative changes of the lower lumbar spine. IMPRESSION: No evidence of acute cardiopulmonary disease. No evidence of small bowel obstruction or free air. Electronically Signed   By: Julian Hy M.D.   On: 10/10/2015 14:24    Microbiology: Recent Results (from the past 240 hour(s))  C difficile quick scan w PCR reflex     Status: None   Collection Time: 10/11/15  5:47 PM  Result Value Ref Range Status  C Diff antigen NEGATIVE NEGATIVE Final   C Diff toxin NEGATIVE NEGATIVE Final   C Diff interpretation Negative for toxigenic C. difficile  Final     Labs: Basic Metabolic Panel:  Recent Labs Lab 10/10/15 1346 10/11/15 0555 10/12/15 0605  NA 127* 135 138  K 3.7 4.0 3.8  CL 91* 104 108  CO2 25 24 26   GLUCOSE 330* 143* 129*  BUN 10 13 11   CREATININE 0.83 0.77 0.74  CALCIUM 9.4 8.4* 8.2*   Liver Function Tests:  Recent Labs Lab 10/10/15 1346 10/11/15 0555  AST 26 13*  ALT 21 17  ALKPHOS 73 54  BILITOT 0.5 0.6  PROT 9.5* 7.5  ALBUMIN 4.1 3.3*     Recent Labs Lab 10/10/15 1346 10/11/15 0555 10/12/15 0605  LIPASE 656* 95* 30   No results for input(s): AMMONIA in the last 168 hours. CBC:  Recent Labs Lab 10/10/15 1346 10/11/15 0555 10/12/15 0605  WBC 17.5* 12.4* 8.1  NEUTROABS 15.0*  --   --   HGB 12.8 11.1* 10.5*  HCT 38.7 34.5* 32.9*  MCV 66.7* 66.7* 67.8*  PLT 320 267 195   Cardiac Enzymes: No results for input(s): CKTOTAL, CKMB, CKMBINDEX, TROPONINI in the last 168 hours. BNP: BNP (last 3 results) No results for input(s): BNP in the last 8760 hours.  ProBNP (last 3 results) No results for input(s): PROBNP in the last 8760 hours.  CBG:  Recent Labs Lab 10/11/15 2004 10/11/15 2347 10/12/15 0424 10/12/15 0740 10/12/15 1216  GLUCAP 144* 121* 116* 134* 130*       Signed:  Kaydince Towles  [facility] 10/12/2015, 4:49 PM

## 2015-10-13 LAB — GIARDIA/CRYPTOSPORIDIUM SCREEN(EIA)
CRYPTOSPORIDIUM SCREEN (EIA): NEGATIVE
Giardia Screen - EIA: NEGATIVE

## 2015-10-29 HISTORY — PX: COLONOSCOPY: SHX174

## 2016-10-19 ENCOUNTER — Emergency Department (HOSPITAL_COMMUNITY)
Admission: EM | Admit: 2016-10-19 | Discharge: 2016-10-20 | Disposition: A | Discharge status: Home / Self Care | Payer: BLUE CROSS/BLUE SHIELD | Attending: Emergency Medicine | Admitting: Emergency Medicine

## 2016-10-19 ENCOUNTER — Encounter (HOSPITAL_COMMUNITY): Payer: Self-pay | Admitting: Emergency Medicine

## 2016-10-19 DIAGNOSIS — R112 Nausea with vomiting, unspecified: Secondary | ICD-10-CM | POA: Diagnosis present

## 2016-10-19 DIAGNOSIS — R1030 Lower abdominal pain, unspecified: Secondary | ICD-10-CM | POA: Insufficient documentation

## 2016-10-19 DIAGNOSIS — Z7984 Long term (current) use of oral hypoglycemic drugs: Secondary | ICD-10-CM | POA: Diagnosis not present

## 2016-10-19 DIAGNOSIS — E119 Type 2 diabetes mellitus without complications: Secondary | ICD-10-CM | POA: Insufficient documentation

## 2016-10-19 DIAGNOSIS — R197 Diarrhea, unspecified: Secondary | ICD-10-CM | POA: Insufficient documentation

## 2016-10-19 DIAGNOSIS — F17210 Nicotine dependence, cigarettes, uncomplicated: Secondary | ICD-10-CM | POA: Insufficient documentation

## 2016-10-19 DIAGNOSIS — I10 Essential (primary) hypertension: Secondary | ICD-10-CM | POA: Diagnosis not present

## 2016-10-19 DIAGNOSIS — Z79899 Other long term (current) drug therapy: Secondary | ICD-10-CM | POA: Diagnosis not present

## 2016-10-19 LAB — CBC
HEMATOCRIT: 34.2 % — AB (ref 36.0–46.0)
HEMOGLOBIN: 11.4 g/dL — AB (ref 12.0–15.0)
MCH: 21.9 pg — ABNORMAL LOW (ref 26.0–34.0)
MCHC: 33.3 g/dL (ref 30.0–36.0)
MCV: 65.8 fL — AB (ref 78.0–100.0)
Platelets: 232 10*3/uL (ref 150–400)
RBC: 5.2 MIL/uL — ABNORMAL HIGH (ref 3.87–5.11)
RDW: 16.4 % — ABNORMAL HIGH (ref 11.5–15.5)
WBC: 12.1 10*3/uL — AB (ref 4.0–10.5)

## 2016-10-19 LAB — COMPREHENSIVE METABOLIC PANEL
ALBUMIN: 4.2 g/dL (ref 3.5–5.0)
ALT: 20 U/L (ref 14–54)
ANION GAP: 11 (ref 5–15)
AST: 30 U/L (ref 15–41)
Alkaline Phosphatase: 64 U/L (ref 38–126)
BILIRUBIN TOTAL: 0.3 mg/dL (ref 0.3–1.2)
BUN: 7 mg/dL (ref 6–20)
CHLORIDE: 97 mmol/L — AB (ref 101–111)
CO2: 22 mmol/L (ref 22–32)
Calcium: 9.6 mg/dL (ref 8.9–10.3)
Creatinine, Ser: 0.78 mg/dL (ref 0.44–1.00)
GFR calc Af Amer: >60 mL/min (ref >60)
Glucose, Bld: 229 mg/dL — ABNORMAL HIGH (ref 65–99)
POTASSIUM: 4.3 mmol/L (ref 3.5–5.1)
Sodium: 130 mmol/L — ABNORMAL LOW (ref 135–145)
TOTAL PROTEIN: 9 g/dL — AB (ref 6.5–8.1)

## 2016-10-19 LAB — LIPASE, BLOOD: LIPASE: 185 U/L — AB (ref 11–51)

## 2016-10-19 MED ORDER — HYDROMORPHONE HCL 1 MG/ML IJ SOLN
0.5000 mg | Freq: Once | INTRAMUSCULAR | Status: AC
  Administered 2016-10-19: 0.5 mg via INTRAVENOUS
  Filled 2016-10-19: qty 1

## 2016-10-19 MED ORDER — METOCLOPRAMIDE HCL 5 MG/ML IJ SOLN
5.0000 mg | Freq: Once | INTRAMUSCULAR | Status: AC
  Administered 2016-10-19: 5 mg via INTRAVENOUS
  Filled 2016-10-19: qty 2

## 2016-10-19 MED ORDER — SODIUM CHLORIDE 0.9 % IV SOLN
Freq: Once | INTRAVENOUS | Status: AC
  Administered 2016-10-19: 23:00:00 via INTRAVENOUS

## 2016-10-19 MED ORDER — SODIUM CHLORIDE 0.9 % IV BOLUS (SEPSIS)
500.0000 mL | Freq: Once | INTRAVENOUS | Status: AC
  Administered 2016-10-19: 500 mL via INTRAVENOUS

## 2016-10-19 NOTE — ED Provider Notes (Signed)
Multiple episodes of n/v/d today with some lower abd ttp - she has a benign exam other than the lower abd ttp - this is not the RLQ.  She has normal heart and lung exam, no guarding or peritoneal abdominel findings.  Labs reviewed - has elevated lipase but only mildly - this is suggestive of a GI process / colitis / gastroenteritis - will attempt symptoms contral with meds s/a anti nauasea and pain meds.  Pt in agreement.    Medical screening examination/treatment/procedure(s) were conducted as a shared visit with non-physician practitioner(s) and myself.  I personally evaluated the patient during the encounter.  Clinical Impression:   Final diagnoses:  Nausea vomiting and diarrhea         Shawna Chapel, MD 10/22/16 0002

## 2016-10-19 NOTE — ED Triage Notes (Signed)
Pt reports onset of emesis and diarrhea that started at 1600 this afternoon.

## 2016-10-20 MED ORDER — IBUPROFEN 600 MG PO TABS: 600.0000 mg | ORAL_TABLET | Freq: Four times a day (QID) | ORAL | 0 refills | Status: DC | PRN

## 2016-10-20 MED ORDER — ONDANSETRON 4 MG PREPACK (~~LOC~~): 1.0000 | ORAL_TABLET | Freq: Three times a day (TID) | ORAL | 0 refills | Status: DC | PRN

## 2016-10-20 MED ORDER — ONDANSETRON 4 MG PO TBDP: 4.0000 mg | ORAL_TABLET | Freq: Three times a day (TID) | ORAL | 0 refills | Status: DC | PRN

## 2016-10-20 MED ORDER — ONDANSETRON 4 MG PO TBDP
4.0000 mg | ORAL_TABLET | Freq: Once | ORAL | Status: AC
  Administered 2016-10-20: 4 mg via ORAL
  Filled 2016-10-20: qty 1

## 2016-10-20 MED ORDER — DICYCLOMINE HCL 20 MG PO TABS: 20.0000 mg | ORAL_TABLET | Freq: Two times a day (BID) | ORAL | 0 refills | Status: DC

## 2016-10-20 MED ORDER — METOCLOPRAMIDE HCL 5 MG/ML IJ SOLN: 5.0000 mg | Freq: Once | INTRAMUSCULAR | Status: DC

## 2016-10-20 NOTE — ED Provider Notes (Signed)
[facility] DEPT Provider Note   CSN: [id number] Arrival date & time: 10/19/16  2201     History   Chief Complaint Chief Complaint  Patient presents with  . Emesis  . Diarrhea    HPI Shawna Huerta is a 55 y.o. female with pmh of DM, HTN, IBS, cholecystectomy, abdominal hysterectomy and acute pancreatitis (admission 09/2015) presents with nausea, non bloody emesis, diarrhea and lower abdominal pain that started this 7 hours ago.  Pt denies alcohol intake. No fevers, no sick contacts, no chest pain/shortness of breath, no urinary symptoms, no h/o kidney stones  Pt has been unable to tolerate PO intake since symptom onset.    HPI  Past Medical History:  Diagnosis Date  . Anemia   . Arthritis   . Bartholin cyst 07/03/2014  . Colitis 08/2015   Treated at [facility]  . Diabetes mellitus   . Hypertension   . Labial abscess 08/2015   Status post I&D by Dr. Ladona Horns  . Nicotine addiction 09/03/2013  . Obesity   . Vaginal itching 09/03/2013    Patient Active Problem List   Diagnosis Date Noted  . IBS (irritable bowel syndrome) 10/12/2015  . Acute pancreatitis 10/10/2015  . Diarrhea 10/10/2015  . Nausea and vomiting 10/10/2015  . Type 2 diabetes mellitus without complication ([place]) [id number]  . Labial abscess 10/10/2015  . Hyponatremia 10/10/2015  . Essential hypertension 10/10/2015  . Tobacco abuse 10/10/2015  . Bartholin cyst 07/03/2014  . Diabetes ([place]) 09/03/2013  . Obesity 09/03/2013  . Vaginal itching 09/03/2013  . Nicotine addiction 09/03/2013  . 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 TENOSYNOVITIS OF FOOT AND ANKLE 09/22/2010  . FOOT PAIN, BILATERAL 09/22/2010    Past Surgical History:  Procedure Laterality Date  . ABDOMINAL HYSTERECTOMY    . ABDOMINAL SURGERY     exploratry with TAH  . BARTHOLIN GLAND CYST EXCISION Right 08/20/2014   Procedure: EXCISION OF RIGHT BARTHOLIN'S GLAND CYST;  Surgeon: Florian Buff, MD;  Location: [facility];  Service:  Gynecology;  Laterality: Right;  . CHOLECYSTECTOMY    . FOOT SURGERY    . FOOT SURGERY Right 2011   shave bone  . TONSILLECTOMY      OB History    Gravida Para Term Preterm AB Living   2 1 1   1 1    SAB TAB Ectopic Multiple Live Births   1               Home Medications    Prior to Admission medications   Medication Sig Start Date End Date Taking? Authorizing Provider  ferrous sulfate 325 (65 FE) MG EC tablet Take 325 mg by mouth daily with breakfast.   Yes Historical Provider, MD  glyBURIDE-metformin (GLUCOVANCE) 5-500 MG per tablet Take 1 tablet by mouth 2 (two) times daily.   Yes Historical Provider, MD  lisinopril (PRINIVIL,ZESTRIL) 10 MG tablet Take 10 mg by mouth daily. 03/12/15  Yes Historical Provider, MD  ondansetron (ZOFRAN) 4 MG tablet take 1 tablet by mouth three times a day if needed for nausea 10/09/15  Yes Historical Provider, MD  dicyclomine (BENTYL) 20 MG tablet Take 1 tablet (20 mg total) by mouth 2 (two) times daily. 10/20/16   Kinnie Feil, PA-C  ibuprofen (ADVIL,MOTRIN) 600 MG tablet Take 1 tablet (600 mg total) by mouth every 6 (six) hours as needed. 10/20/16   Kinnie Feil, PA-C  ondansetron (ZOFRAN ODT) 4 MG disintegrating tablet Take 1 tablet (4 mg total) by  mouth every 8 (eight) hours as needed for nausea or vomiting. 10/20/16   Kinnie Feil, PA-C  ondansetron (ZOFRAN) 4 mg TABS tablet Take 4 tablets by mouth every 8 (eight) hours as needed. Please provide bottle to go 10/20/16   Kinnie Feil, PA-C    Family History Family History  Problem Relation Age of Onset  . Diabetes Mother   . Stroke Mother   . Cancer Father   . Diabetes Sister   . Cancer Brother   . Diabetes Brother     Social History Social History  Substance Use Topics  . Smoking status: Current Every Day Smoker    Packs/day: 0.25    Years: 3.00    Types: Cigarettes  . Smokeless tobacco: Never Used  . Alcohol use No     Comment: occ.     Allergies    Iohexol   Review of Systems Review of Systems  All other systems reviewed and are negative.    Physical Exam Updated Vital Signs BP 165/78 (BP Location: Left Arm)   Pulse 84   Temp 98 F (36.7 C)   Resp 20   Ht 5\' 7"  (1.702 m)   Wt 115.7 kg   SpO2 99%   BMI 39.94 kg/m   Physical Exam  Constitutional: She appears well-developed and well-nourished. No distress.  HENT:  Head: Normocephalic and atraumatic.  Nose: Nose normal.  Mouth/Throat: Oropharynx is clear and moist. No oropharyngeal exudate.  Eyes: Conjunctivae and EOM are normal. Pupils are equal, round, and reactive to light.  Neck: Neck supple.  Cardiovascular: Normal rate, regular rhythm, normal heart sounds and intact distal pulses.   No murmur heard. Pulmonary/Chest: Effort normal and breath sounds normal. No respiratory distress.  Abdominal: Soft. She exhibits no distension and no mass. There is tenderness (lower abdomen). There is no rebound and no guarding.  Musculoskeletal: She exhibits no edema.  Lymphadenopathy:    She has no cervical adenopathy.  Neurological: She is alert.  Skin: Skin is warm and dry. Capillary refill takes less than 2 seconds.  Psychiatric: She has a normal mood and affect.  Nursing note and vitals reviewed.    ED Treatments / Results  Labs (all labs ordered are listed, but only abnormal results are displayed) Labs Reviewed  LIPASE, BLOOD - Abnormal; Notable for the following:       Result Value   Lipase 185 (*)    All other components within normal limits  COMPREHENSIVE METABOLIC PANEL - Abnormal; Notable for the following:    Sodium 130 (*)    Chloride 97 (*)    Glucose, Bld 229 (*)    Total Protein 9.0 (*)    All other components within normal limits  CBC - Abnormal; Notable for the following:    WBC 12.1 (*)    RBC 5.20 (*)    Hemoglobin 11.4 (*)    HCT 34.2 (*)    MCV 65.8 (*)    MCH 21.9 (*)    RDW 16.4 (*)    All other components within normal limits     EKG  EKG Interpretation None       Radiology No results found.  Procedures Procedures (including critical care time)  Medications Ordered in ED Medications  metoCLOPramide (REGLAN) injection 5 mg (5 mg Intravenous Given 10/19/16 2253)  sodium chloride 0.9 % bolus 500 mL (0 mLs Intravenous Stopped 10/19/16 2328)  0.9 %  sodium chloride infusion ( Intravenous Stopped 10/20/16 0029)  HYDROmorphone (DILAUDID)  injection 0.5 mg (0.5 mg Intravenous Given 10/19/16 2310)  ondansetron (ZOFRAN-ODT) disintegrating tablet 4 mg (4 mg Oral Given 10/20/16 0030)     Initial Impression / Assessment and Plan / ED Course  I have reviewed the triage vital signs and the nursing notes.  Pertinent labs & imaging results that were available during my care of the patient were reviewed by me and considered in my medical decision making (see chart for details).  Clinical Course as of Oct 21 1340  Wed Oct 19, 2016  2254 Lipase: (!) 185 [BM]  2350 Platelets: 232 [BM]    Clinical Course User Index [BM] Noemi Chapel, MD    55 yo female with pmh of non insulin dependent DM, HTN, IBS, cholecystectomy, abdominal hysterectomy and acute pancreatitis (admission 09/2015) presents with nausea, vomiting, abdominal pain and diarrhea since 7 hours ago.  Ddx includes ACS/MI, SBO, pancreatitis, DKA, gastroenteritis.  On exam pt reports abdominal pain 8/10 and moderate nausea, tachypnic with elevated SBP 188, moist mucous membranes, mild TTP at lower abdomen, no suprapubic or CVA tenderness.  Pt received reglan, zofran, dilaudid in ED and reported improvement of nausea and abdominal pain.  CBC remarkable for mild leukocytosis and anemia. CMP remarkable for mild hyperglycemia glucose 229.  Lipase mildly elevated 185.  U/A, EKG and further imaging not indicated at this time given acceptable labs and mostly benign physical exam.  Etiology of GI symptoms most likely d/t gastroenteritis.  Pt stable to be discharged at this  time.  Will discharge pt with bentyl, ibuprofen and zofran.  ED precautions given.  Pt agreeable to dispo plan.  Final Clinical Impressions(s) / ED Diagnoses   Final diagnoses:  Nausea vomiting and diarrhea    New Prescriptions Discharge Medication List as of 10/20/2016 12:15 AM    START taking these medications   Details  ibuprofen (ADVIL,MOTRIN) 600 MG tablet Take 1 tablet (600 mg total) by mouth every 6 (six) hours as needed., Starting Thu 10/20/2016, Print    ondansetron (ZOFRAN ODT) 4 MG disintegrating tablet Take 1 tablet (4 mg total) by mouth every 8 (eight) hours as needed for nausea or vomiting., Starting Thu 10/20/2016, Print    !! ondansetron (ZOFRAN) 4 mg TABS tablet Take 4 tablets by mouth every 8 (eight) hours as needed. Please provide bottle to go, Starting Thu 10/20/2016, Print     !! - Potential duplicate medications found. Please discuss with provider.       Kinnie Feil, PA-C 10/21/16 1341    Noemi Chapel, MD 10/22/16 Dyann Kief

## 2016-10-20 NOTE — Discharge Instructions (Signed)
You have been prescribed Bentyl - this medication may help with abdominal pain and gut spasms You have been prescribed a high dose of ibuprofen - this medication will help with abdominal pain You have been prescribed Zofran - this medication will help with nausea Please drink plenty of water to stay well hydrated Eat small meals and avoid greasy, fatty and spicy foods for the next couple of days until you start feeling better Return to ED if your symptoms worsen, if you develop a fever

## 2016-10-23 ENCOUNTER — Emergency Department (HOSPITAL_COMMUNITY): Payer: BLUE CROSS/BLUE SHIELD

## 2016-10-23 ENCOUNTER — Encounter (HOSPITAL_COMMUNITY): Payer: Self-pay | Admitting: *Deleted

## 2016-10-23 ENCOUNTER — Inpatient Hospital Stay (HOSPITAL_COMMUNITY)
Admission: EM | Admit: 2016-10-23 | Discharge: 2016-10-27 | DRG: 074 | Disposition: A | Discharge status: Home / Self Care | Payer: BLUE CROSS/BLUE SHIELD | Attending: Internal Medicine | Admitting: Internal Medicine

## 2016-10-23 DIAGNOSIS — E876 Hypokalemia: Secondary | ICD-10-CM | POA: Diagnosis present

## 2016-10-23 DIAGNOSIS — K3189 Other diseases of stomach and duodenum: Secondary | ICD-10-CM

## 2016-10-23 DIAGNOSIS — E1169 Type 2 diabetes mellitus with other specified complication: Secondary | ICD-10-CM

## 2016-10-23 DIAGNOSIS — Z87891 Personal history of nicotine dependence: Secondary | ICD-10-CM

## 2016-10-23 DIAGNOSIS — K529 Noninfective gastroenteritis and colitis, unspecified: Secondary | ICD-10-CM | POA: Diagnosis not present

## 2016-10-23 DIAGNOSIS — N179 Acute kidney failure, unspecified: Secondary | ICD-10-CM | POA: Diagnosis present

## 2016-10-23 DIAGNOSIS — Z7984 Long term (current) use of oral hypoglycemic drugs: Secondary | ICD-10-CM

## 2016-10-23 DIAGNOSIS — K3184 Gastroparesis: Secondary | ICD-10-CM

## 2016-10-23 DIAGNOSIS — Z720 Tobacco use: Secondary | ICD-10-CM | POA: Diagnosis present

## 2016-10-23 DIAGNOSIS — E1143 Type 2 diabetes mellitus with diabetic autonomic (poly)neuropathy: Principal | ICD-10-CM | POA: Diagnosis present

## 2016-10-23 DIAGNOSIS — E1142 Type 2 diabetes mellitus with diabetic polyneuropathy: Secondary | ICD-10-CM | POA: Diagnosis present

## 2016-10-23 DIAGNOSIS — D509 Iron deficiency anemia, unspecified: Secondary | ICD-10-CM | POA: Diagnosis present

## 2016-10-23 DIAGNOSIS — E860 Dehydration: Secondary | ICD-10-CM | POA: Diagnosis present

## 2016-10-23 DIAGNOSIS — K210 Gastro-esophageal reflux disease with esophagitis: Secondary | ICD-10-CM | POA: Diagnosis present

## 2016-10-23 DIAGNOSIS — A084 Viral intestinal infection, unspecified: Secondary | ICD-10-CM | POA: Diagnosis present

## 2016-10-23 DIAGNOSIS — Z833 Family history of diabetes mellitus: Secondary | ICD-10-CM

## 2016-10-23 DIAGNOSIS — Z823 Family history of stroke: Secondary | ICD-10-CM

## 2016-10-23 DIAGNOSIS — E871 Hypo-osmolality and hyponatremia: Secondary | ICD-10-CM | POA: Diagnosis present

## 2016-10-23 DIAGNOSIS — I10 Essential (primary) hypertension: Secondary | ICD-10-CM | POA: Diagnosis present

## 2016-10-23 DIAGNOSIS — E119 Type 2 diabetes mellitus without complications: Secondary | ICD-10-CM

## 2016-10-23 DIAGNOSIS — R112 Nausea with vomiting, unspecified: Secondary | ICD-10-CM | POA: Diagnosis not present

## 2016-10-23 DIAGNOSIS — K589 Irritable bowel syndrome without diarrhea: Secondary | ICD-10-CM | POA: Diagnosis present

## 2016-10-23 HISTORY — DX: Acute pancreatitis without necrosis or infection, unspecified: K85.90

## 2016-10-23 LAB — CBC WITH DIFFERENTIAL/PLATELET
Basophils Absolute: 0 10*3/uL (ref 0.0–0.1)
Basophils Relative: 0 %
EOS PCT: 0 %
Eosinophils Absolute: 0 10*3/uL (ref 0.0–0.7)
HCT: 46.6 % — ABNORMAL HIGH (ref 36.0–46.0)
HEMOGLOBIN: 16.1 g/dL — AB (ref 12.0–15.0)
LYMPHS ABS: 5.6 10*3/uL — AB (ref 0.7–4.0)
LYMPHS PCT: 31 %
MCH: 22.8 pg — AB (ref 26.0–34.0)
MCHC: 34.5 g/dL (ref 30.0–36.0)
MCV: 65.9 fL — AB (ref 78.0–100.0)
Monocytes Absolute: 1.4 10*3/uL — ABNORMAL HIGH (ref 0.1–1.0)
Monocytes Relative: 8 %
Neutro Abs: 10.9 10*3/uL — ABNORMAL HIGH (ref 1.7–7.7)
Neutrophils Relative %: 61 %
PLATELETS: 267 10*3/uL (ref 150–400)
RBC: 7.07 MIL/uL — AB (ref 3.87–5.11)
RDW: 16.2 % — ABNORMAL HIGH (ref 11.5–15.5)
WBC: 17.9 10*3/uL — AB (ref 4.0–10.5)

## 2016-10-23 LAB — URINALYSIS, ROUTINE W REFLEX MICROSCOPIC
Bilirubin Urine: NEGATIVE
Glucose, UA: NEGATIVE mg/dL
Ketones, ur: NEGATIVE mg/dL
LEUKOCYTES UA: NEGATIVE
NITRITE: NEGATIVE
Specific Gravity, Urine: 1.01 (ref 1.005–1.030)
pH: 5.5 (ref 5.0–8.0)

## 2016-10-23 LAB — URINE MICROSCOPIC-ADD ON

## 2016-10-23 LAB — COMPREHENSIVE METABOLIC PANEL
ALBUMIN: 4.5 g/dL (ref 3.5–5.0)
ALT: 14 U/L (ref 14–54)
AST: 18 U/L (ref 15–41)
Alkaline Phosphatase: 61 U/L (ref 38–126)
Anion gap: 15 (ref 5–15)
BUN: 29 mg/dL — AB (ref 6–20)
CHLORIDE: 91 mmol/L — AB (ref 101–111)
CO2: 21 mmol/L — AB (ref 22–32)
CREATININE: 1.8 mg/dL — AB (ref 0.44–1.00)
Calcium: 10.1 mg/dL (ref 8.9–10.3)
GFR calc non Af Amer: 31 mL/min — ABNORMAL LOW (ref >60)
GFR, EST AFRICAN AMERICAN: 36 mL/min — AB (ref >60)
Glucose, Bld: 228 mg/dL — ABNORMAL HIGH (ref 65–99)
Potassium: 3 mmol/L — ABNORMAL LOW (ref 3.5–5.1)
SODIUM: 127 mmol/L — AB (ref 135–145)
Total Bilirubin: 0.7 mg/dL (ref 0.3–1.2)
Total Protein: 9.9 g/dL — ABNORMAL HIGH (ref 6.5–8.1)

## 2016-10-23 LAB — LIPASE, BLOOD: Lipase: 59 U/L — ABNORMAL HIGH (ref 11–51)

## 2016-10-23 LAB — MAGNESIUM: MAGNESIUM: 2 mg/dL (ref 1.7–2.4)

## 2016-10-23 MED ORDER — SODIUM CHLORIDE 0.9% FLUSH
3.0000 mL | Freq: Two times a day (BID) | INTRAVENOUS | Status: DC
  Administered 2016-10-24 – 2016-10-27 (×4): 3 mL via INTRAVENOUS

## 2016-10-23 MED ORDER — ONDANSETRON HCL 4 MG/2ML IJ SOLN
4.0000 mg | Freq: Once | INTRAMUSCULAR | Status: AC
  Administered 2016-10-23: 4 mg via INTRAVENOUS

## 2016-10-23 MED ORDER — HYDROMORPHONE HCL 1 MG/ML IJ SOLN
1.0000 mg | Freq: Once | INTRAMUSCULAR | Status: AC
  Administered 2016-10-23: 1 mg via INTRAVENOUS
  Filled 2016-10-23: qty 1

## 2016-10-23 MED ORDER — ONDANSETRON HCL 4 MG/2ML IJ SOLN
4.0000 mg | Freq: Once | INTRAMUSCULAR | Status: AC
  Administered 2016-10-23: 4 mg via INTRAVENOUS
  Filled 2016-10-23: qty 2

## 2016-10-23 MED ORDER — POTASSIUM CHLORIDE IN NACL 40-0.9 MEQ/L-% IV SOLN
INTRAVENOUS | Status: DC
  Administered 2016-10-23: 125 mL/h via INTRAVENOUS

## 2016-10-23 MED ORDER — NICOTINE 21 MG/24HR TD PT24: 21.0000 mg | MEDICATED_PATCH | Freq: Every day | TRANSDERMAL | Status: DC | PRN

## 2016-10-23 MED ORDER — ACETAMINOPHEN 325 MG PO TABS
650.0000 mg | ORAL_TABLET | Freq: Once | ORAL | Status: AC
  Administered 2016-10-23: 650 mg via ORAL
  Filled 2016-10-23: qty 2

## 2016-10-23 MED ORDER — INSULIN ASPART 100 UNIT/ML ~~LOC~~ SOLN
0.0000 [IU] | Freq: Three times a day (TID) | SUBCUTANEOUS | Status: DC
  Administered 2016-10-24 (×3): 2 [IU] via SUBCUTANEOUS
  Administered 2016-10-25: 1 [IU] via SUBCUTANEOUS

## 2016-10-23 MED ORDER — POTASSIUM CHLORIDE CRYS ER 20 MEQ PO TBCR
40.0000 meq | EXTENDED_RELEASE_TABLET | Freq: Once | ORAL | Status: AC
  Administered 2016-10-23: 40 meq via ORAL
  Filled 2016-10-23: qty 2

## 2016-10-23 MED ORDER — ONDANSETRON HCL 4 MG/2ML IJ SOLN
4.0000 mg | Freq: Four times a day (QID) | INTRAMUSCULAR | Status: DC | PRN
  Administered 2016-10-24 – 2016-10-25 (×2): 4 mg via INTRAVENOUS
  Filled 2016-10-23 (×2): qty 2

## 2016-10-23 MED ORDER — SODIUM CHLORIDE 0.9 % IV BOLUS (SEPSIS)
1000.0000 mL | Freq: Once | INTRAVENOUS | Status: AC
  Administered 2016-10-23: 1000 mL via INTRAVENOUS

## 2016-10-23 MED ORDER — FAMOTIDINE IN NACL 20-0.9 MG/50ML-% IV SOLN
20.0000 mg | Freq: Two times a day (BID) | INTRAVENOUS | Status: DC
  Administered 2016-10-23 – 2016-10-25 (×4): 20 mg via INTRAVENOUS
  Filled 2016-10-23 (×4): qty 50

## 2016-10-23 MED ORDER — HYDROMORPHONE HCL 1 MG/ML IJ SOLN
1.0000 mg | INTRAMUSCULAR | Status: DC | PRN
  Administered 2016-10-24 – 2016-10-27 (×12): 1 mg via INTRAVENOUS
  Filled 2016-10-23 (×13): qty 1

## 2016-10-23 NOTE — H&P (Signed)
History and Physical    Shawna Huerta [id number] DOB: 1960/12/26 DOA: 10/23/2016  PCP: Glenda Chroman, MD   Patient coming from: Home.  Chief Complaint: Abdominal pain, nausea and vomiting.  HPI: Shawna Huerta is a 55 y.o. female with medical history significant of anemia, arthritis, Bartholin's cyst excision, colitis, hypertension, tobacco use disorder, previous acute pancreatitis episode in 09/2015 who presents to the emergency department for the second time since Wednesday due to above complaints.  Per patient, since Wednesday morning after she ate some yogurt, she has been having nausea with over 10 episodes of emesis daily, associated with multiple episodes of diarrhea and diffuse abdominal pain. She has been unable to drink or eat without treated and vomiting. She denies fever, chills, but feels frequently lightheaded, very tired and fatigued. She denies dyspnea, productive cough, chest pain, diaphoresis, PND, orthopnea or pitting edema of the lower extremities. She denies sick contacts or travel history.   ED Course: The patient received a 2000 mL normal saline bolus. Also hydromorphone 1, Zofran 4 and famotidine 20 mg IVP. She is states that her pain has decreased from 9 to about a 4 out of 10 now.   Her workup shows a white count of 17.9, hemoglobin 16.1 g/dL, which are higher than the patient's baseline likely due to hemoconcentration. CT scan of the abdomen/pelvis without contrast shows sigmoid diverticulosis without evidence for diverticulitis and did not show any acute intra-abdominal or pelvic pathology.  Review of Systems: As per HPI otherwise 10 point review of systems negative.    Past Medical History:  Diagnosis Date  . Anemia   . Arthritis   . Bartholin cyst 07/03/2014  . Colitis 08/2015   T[facility] at [facility]  . Diabetes mellitus   . Hypertension   . Labial abscess 08/2015   Status post I&D by Dr. Ladona Horns  . Nicotine addiction 09/03/2013  . Obesity   .  Vaginal itching 09/03/2013    Past Surgical History:  Procedure Laterality Date  . ABDOMINAL HYSTERECTOMY    . ABDOMINAL SURGERY     exploratry with TAH  . BARTHOLIN GLAND CYST EXCISION Right 08/20/2014   Procedure: EXCISION OF RIGHT BARTHOLIN'S GLAND CYST;  Surgeon: Florian Buff, MD;  Location: [facility];  Service: Gynecology;  Laterality: Right;  . CHOLECYSTECTOMY    . FOOT SURGERY    . FOOT SURGERY Right 2011   shave bone  . TONSILLECTOMY       reports that she quit smoking 2 days ago. Her smoking use included Cigarettes. She has a 0.75 pack-year smoking history. She has never used smokeless tobacco. She reports that she does not drink alcohol or use drugs.  Allergies  Allergen Reactions  . Iohexol Swelling     Code: HIVES, Desc: PT STATES THE EYES SWOLL AND ITCHED, NEEDS PRE MEDS     Family History  Problem Relation Age of Onset  . Diabetes Mother   . Stroke Mother   . Cancer Father   . Diabetes Sister   . Cancer Brother   . Diabetes Brother      Prior to Admission medications   Medication Sig Start Date End Date Taking? Authorizing Provider  dicyclomine (BENTYL) 20 MG tablet Take 1 tablet (20 mg total) by mouth 2 (two) times daily. 10/20/16  Yes Kinnie Feil, PA-C  ferrous sulfate 325 (65 FE) MG EC tablet Take 325 mg by mouth daily with breakfast.   Yes Historical Provider, MD  glyBURIDE-metformin (GLUCOVANCE) 5-500  MG per tablet Take 1 tablet by mouth 2 (two) times daily.   Yes Historical Provider, MD  ibuprofen (ADVIL,MOTRIN) 600 MG tablet Take 1 tablet (600 mg total) by mouth every 6 (six) hours as needed. 10/20/16  Yes Kinnie Feil, PA-C  lisinopril (PRINIVIL,ZESTRIL) 10 MG tablet Take 10 mg by mouth daily. 03/12/15  Yes Historical Provider, MD  ondansetron (ZOFRAN ODT) 4 MG disintegrating tablet Take 1 tablet (4 mg total) by mouth every 8 (eight) hours as needed for nausea or vomiting. 10/20/16  Yes Kinnie Feil, PA-C  ondansetron (ZOFRAN) 4 mg TABS  tablet Take 4 tablets by mouth every 8 (eight) hours as needed. Please provide bottle to go 10/20/16  Yes Kinnie Feil, PA-C    Physical Exam:  Constitutional: NAD, calm, comfortable Vitals:   10/23/16 1930 10/23/16 2000 10/23/16 2030 10/23/16 2100  BP: 134/84 145/77 157/80 151/76  Pulse: 71 73 68 106  Resp: 14 (!) 9 20 15   Temp:      TempSrc:      SpO2: 98% 100% 97% 100%  Weight:      Height:       Eyes: PERRL, lids and conjunctivae normal ENMT: Mucous membranes are dry. Posterior pharynx clear of any exudate or lesions. Neck: normal, supple, no masses, no thyromegaly Respiratory: Clear to auscultation bilaterally, no wheezing, no crackles. Normal respiratory effort. No accessory muscle use.  Cardiovascular: Regular rate and rhythm, no murmurs / rubs / gallops. No extremity edema. 2+ pedal pulses. No carotid bruits.  Abdomen: Bowel sounds positive. Soft, positive epigastric and lower quadrants tenderness, no guarding/rebound/masses palpated. No hepatosplenomegaly.  Musculoskeletal: no clubbing / cyanosis. Good ROM, no contractures. Normal muscle tone.  Skin: no rashes, lesions, ulcers. No induration Neurologic: CN 2-12 grossly intact. Sensation intact, DTR normal. Strength 5/5 in all 4.  Psychiatric: Normal judgment and insight. Alert and oriented x 4. Normal mood.    Labs on Admission: I have personally reviewed following labs and imaging studies  CBC:  Recent Labs Lab 10/19/16 2245 10/23/16 1839  WBC 12.1* 17.9*  NEUTROABS  --  10.9*  HGB 11.4* 16.1*  HCT 34.2* 46.6*  MCV 65.8* 65.9*  PLT 232 [id number]   Basic Metabolic Panel:  Recent Labs Lab 10/19/16 2208 10/23/16 1839  NA 130* 127*  K 4.3 3.0*  CL 97* 91*  CO2 22 21*  GLUCOSE 229* 228*  BUN 7 29*  CREATININE 0.78 1.80*  CALCIUM 9.6 10.1  MG  --  2.0   GFR: Estimated Creatinine Clearance: 48 mL/min (by C-G formula based on SCr of 1.8 mg/dL (H)). Liver Function Tests:  Recent Labs Lab 10/19/16 2208  10/23/16 1839  AST 30 18  ALT 20 14  ALKPHOS 64 61  BILITOT 0.3 0.7  PROT 9.0* 9.9*  ALBUMIN 4.2 4.5    Recent Labs Lab 10/19/16 2208 10/23/16 1839  LIPASE 185* 59*   No results for input(s): AMMONIA in the last 168 hours. Coagulation Profile: No results for input(s): INR, PROTIME in the last 168 hours. Cardiac Enzymes: No results for input(s): CKTOTAL, CKMB, CKMBINDEX, TROPONINI in the last 168 hours. BNP (last 3 results) No results for input(s): PROBNP in the last 8760 hours. HbA1C: No results for input(s): HGBA1C in the last 72 hours. CBG: No results for input(s): GLUCAP in the last 168 hours. Lipid Profile: No results for input(s): CHOL, HDL, LDLCALC, TRIG, CHOLHDL, LDLDIRECT in the last 72 hours. Thyroid Function Tests: No results for input(s): TSH, T4TOTAL, FREET4,  T3FREE, THYROIDAB in the last 72 hours. Anemia Panel: No results for input(s): VITAMINB12, FOLATE, FERRITIN, TIBC, IRON, RETICCTPCT in the last 72 hours. Urine analysis:    Component Value Date/Time   COLORURINE YELLOW 10/10/2015 1446   APPEARANCEUR CLOUDY (A) 10/10/2015 1446   LABSPEC >1.030 (H) 10/10/2015 1446   PHURINE 6.0 10/10/2015 1446   GLUCOSEU 500 (A) 10/10/2015 1446   HGBUR MODERATE (A) 10/10/2015 1446   BILIRUBINUR NEGATIVE 10/10/2015 1446   KETONESUR NEGATIVE 10/10/2015 1446   PROTEINUR >300 (A) 10/10/2015 1446   UROBILINOGEN 0.2 10/10/2015 1446   NITRITE NEGATIVE 10/10/2015 1446   LEUKOCYTESUR NEGATIVE 10/10/2015 1446    Radiological Exams on Admission: Ct Abdomen Pelvis Wo Contrast  Result Date: 10/23/2016 CLINICAL DATA:  History of diabetes and hypertension presents with nausea and nonbloody emesis and diarrhea and lower abdominal pain EXAM: CT ABDOMEN AND PELVIS WITHOUT CONTRAST TECHNIQUE: Multidetector CT imaging of the abdomen and pelvis was performed following the standard protocol without IV contrast. COMPARISON:  10/10/2015 FINDINGS: Lower chest: No acute consolidation or  pleural effusion. Heart size normal. Hepatobiliary: No focal hepatic abnormality. Surgical clips in the gallbladder fossa consistent with prior cholecystectomy. No biliary dilatation. Pancreas: Unremarkable. No pancreatic ductal dilatation or surrounding inflammatory changes. Spleen: Normal in size without focal abnormality. Adrenals/Urinary Tract: Adrenal glands are unremarkable. Kidneys are normal, without renal calculi, focal lesion, or hydronephrosis. Bladder is unremarkable. Stomach/Bowel: Stomach is within normal limits. Appendix appears normal. No evidence of bowel wall thickening, distention, or inflammatory changes. Mild sigmoid diverticular disease but no evidence for wall thickening or acute inflammation. Vascular/Lymphatic: No significant vascular findings are present. No enlarged abdominal or pelvic lymph nodes. Reproductive: Status post hysterectomy. No adnexal masses. Other: Surgical clips in the right hemipelvis. No free air or free fluid. Musculoskeletal: No acute or significant osseous findings. IMPRESSION: No CT evidence for acute intra-abdominal or pelvic pathology. Sigmoid diverticulosis without evidence for diverticulitis Electronically Signed   By: Donavan Foil M.D.   On: 10/23/2016 19:25    Assessment/Plan Principal Problem:   Acute gastroenteritis Admit to telemetry/observation. Continue IV hydration and electrolyte replacement. Zofran 4 mg IVPB every 6 hours as needed. Hydromorphone 1 mg every 4 hours as needed for pain. Famotidine 20 mg IVPB every 12 hours. Enteric precautions. Check C. difficile, rotavirus and Norovirus in stool.  Active Problems:   Hyponatremia Secondary to GI losses. Continue normal saline infusion. Follow-up sodium level in a.m.    Essential hypertension Currently not on antihypertensives. Monitor blood pressure closely while in the hospital. Follow-up with PCP as an outpatient.    Tobacco abuse Nicotine replacement therapy as  needed. Smoking cessation info provided.    IBS (irritable bowel syndrome) Resume Bentyl once tolerating oral intake.      Type 2 diabetes mellitus (H[facility]) Currently nothing by mouth. CBG monitoring with sensitivity to regular insulin sliding scale.     DVT prophylaxis: SCDs. Code Status: Full code. Family Communication:  Disposition Plan: Admit for IV hydration, electrolyte replacement, symptoms treatment and further evaluation. Consults called:  Admission status: Observation/telemetry.   Reubin Milan MD [facility] Pager [phone].  If 7PM-7AM, please contact night-coverage [email or web address] Password [facility]  10/23/2016, 9:26 PM

## 2016-10-23 NOTE — ED Notes (Signed)
MD at bedside. 

## 2016-10-23 NOTE — ED Provider Notes (Signed)
[place] DEPT Provider Note   CSN: [id number] Arrival date & time: 10/23/16  1610     History   Chief Complaint Chief Complaint  Patient presents with  . Abdominal Pain    HPI Shawna Huerta is a 55 y.o. female.  HPI  55 year old female presents with worsening abdominal pain. 4 days ago she developed nausea, vomiting, diarrhea, and lower abdominal pain. Seen here that day and discharged with Zofran, Bentyl, and ibuprofen. These are not helping. Symptoms are worsening. She is unable to tolerate oral fluids and food. Has only had one loose stool today but unable to keep down fluids. Abdominal pain is worsening and now more generalized. Feels like a cramping and sharp pain. Pain is currently severe. Fever of 101 3 days ago.  Past Medical History:  Diagnosis Date  . Anemia   . Arthritis   . Bartholin cyst 07/03/2014  . Colitis 08/2015   T[facility]  . Diabetes mellitus   . Hypertension   . Labial abscess 08/2015   Status post I&D by Dr. Ladona Horns  . Nicotine addiction 09/03/2013  . Obesity   . Vaginal itching 09/03/2013    Patient Active Problem List   Diagnosis Date Noted  . IBS (irritable bowel syndrome) 10/12/2015  . Acute pancreatitis 10/10/2015  . Diarrhea 10/10/2015  . Nausea and vomiting 10/10/2015  . Type 2 diabetes mellitus without complication ([place]) [id number]  . Labial abscess 10/10/2015  . Hyponatremia 10/10/2015  . Essential hypertension 10/10/2015  . Tobacco abuse 10/10/2015  . Bartholin cyst 07/03/2014  . Diabetes ([place]) 09/03/2013  . Obesity 09/03/2013  . Vaginal itching 09/03/2013  . Nicotine addiction 09/03/2013  . 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 TENOSYNOVITIS OF FOOT AND ANKLE 09/22/2010  . FOOT PAIN, BILATERAL 09/22/2010    Past Surgical History:  Procedure Laterality Date  . ABDOMINAL HYSTERECTOMY    . ABDOMINAL SURGERY     exploratry with TAH  . BARTHOLIN GLAND CYST EXCISION Right 08/20/2014   Procedure:  EXCISION OF RIGHT BARTHOLIN'S GLAND CYST;  Surgeon: Florian Buff, MD;  Location: [facility];  Service: Gynecology;  Laterality: Right;  . CHOLECYSTECTOMY    . FOOT SURGERY    . FOOT SURGERY Right 2011   shave bone  . TONSILLECTOMY      OB History    Gravida Para Term Preterm AB Living   2 1 1   1 1    SAB TAB Ectopic Multiple Live Births   1               Home Medications    Prior to Admission medications   Medication Sig Start Date End Date Taking? Authorizing Provider  dicyclomine (BENTYL) 20 MG tablet Take 1 tablet (20 mg total) by mouth 2 (two) times daily. 10/20/16  Yes Kinnie Feil, PA-C  ferrous sulfate 325 (65 FE) MG EC tablet Take 325 mg by mouth daily with breakfast.   Yes Historical Provider, MD  glyBURIDE-metformin (GLUCOVANCE) 5-500 MG per tablet Take 1 tablet by mouth 2 (two) times daily.   Yes Historical Provider, MD  ibuprofen (ADVIL,MOTRIN) 600 MG tablet Take 1 tablet (600 mg total) by mouth every 6 (six) hours as needed. 10/20/16  Yes Kinnie Feil, PA-C  lisinopril (PRINIVIL,ZESTRIL) 10 MG tablet Take 10 mg by mouth daily. 03/12/15  Yes Historical Provider, MD  ondansetron (ZOFRAN ODT) 4 MG disintegrating tablet Take 1 tablet (4 mg total) by mouth every 8 (eight) hours as needed for nausea  or vomiting. 10/20/16  Yes Kinnie Feil, PA-C  ondansetron (ZOFRAN) 4 mg TABS tablet Take 4 tablets by mouth every 8 (eight) hours as needed. Please provide bottle to go 10/20/16  Yes Kinnie Feil, PA-C    Family History Family History  Problem Relation Age of Onset  . Diabetes Mother   . Stroke Mother   . Cancer Father   . Diabetes Sister   . Cancer Brother   . Diabetes Brother     Social History Social History  Substance Use Topics  . Smoking status: Former Smoker    Packs/day: 0.25    Years: 3.00    Types: Cigarettes    Quit date: 10/21/2016  . Smokeless tobacco: Never Used  . Alcohol use No     Comment: occ.     Allergies    Iohexol   Review of Systems Review of Systems  Constitutional: Positive for fever.  Gastrointestinal: Positive for abdominal pain, diarrhea, nausea and vomiting.  All other systems reviewed and are negative.    Physical Exam Updated Vital Signs BP 151/76   Pulse 106   Temp 99.4 F (37.4 C) (Rectal)   Resp 15   Ht 5\' 7"  (1.702 m)   Wt 260 lb (117.9 kg)   SpO2 100%   BMI 40.72 kg/m   Physical Exam  Constitutional: She is oriented to person, place, and time. She appears well-developed and well-nourished. She appears distressed (in pain).  HENT:  Head: Normocephalic and atraumatic.  Right Ear: External ear normal.  Left Ear: External ear normal.  Nose: Nose normal.  Eyes: Right eye exhibits no discharge. Left eye exhibits no discharge.  Cardiovascular: Regular rhythm and normal heart sounds.  Tachycardia present.   Pulmonary/Chest: Effort normal and breath sounds normal.  Abdominal: Soft. There is tenderness (diffuse, left worse than right).  Neurological: She is alert and oriented to person, place, and time.  Skin: Skin is warm and dry. She is not diaphoretic.  Nursing note and vitals reviewed.    ED Treatments / Results  Labs (all labs ordered are listed, but only abnormal results are displayed) Labs Reviewed  COMPREHENSIVE METABOLIC PANEL - Abnormal; Notable for the following:       Result Value   Sodium 127 (*)    Potassium 3.0 (*)    Chloride 91 (*)    CO2 21 (*)    Glucose, Bld 228 (*)    BUN 29 (*)    Creatinine, Ser 1.80 (*)    Total Protein 9.9 (*)    GFR calc non Af Amer 31 (*)    GFR calc Af Amer 36 (*)    All other components within normal limits  LIPASE, BLOOD - Abnormal; Notable for the following:    Lipase 59 (*)    All other components within normal limits  CBC WITH DIFFERENTIAL/PLATELET - Abnormal; Notable for the following:    WBC 17.9 (*)    RBC 7.07 (*)    Hemoglobin 16.1 (*)    HCT 46.6 (*)    MCV 65.9 (*)    MCH 22.8 (*)    RDW  16.2 (*)    Neutro Abs 10.9 (*)    Lymphs Abs 5.6 (*)    Monocytes Absolute 1.4 (*)    All other components within normal limits  MAGNESIUM  URINALYSIS, ROUTINE W REFLEX MICROSCOPIC (NOT AT [facility])    EKG  EKG Interpretation None       Radiology Ct Abdomen Pelvis Wo  Contrast  Result Date: 10/23/2016 CLINICAL DATA:  History of diabetes and hypertension presents with nausea and nonbloody emesis and diarrhea and lower abdominal pain EXAM: CT ABDOMEN AND PELVIS WITHOUT CONTRAST TECHNIQUE: Multidetector CT imaging of the abdomen and pelvis was performed following the standard protocol without IV contrast. COMPARISON:  10/10/2015 FINDINGS: Lower chest: No acute consolidation or pleural effusion. Heart size normal. Hepatobiliary: No focal hepatic abnormality. Surgical clips in the gallbladder fossa consistent with prior cholecystectomy. No biliary dilatation. Pancreas: Unremarkable. No pancreatic ductal dilatation or surrounding inflammatory changes. Spleen: Normal in size without focal abnormality. Adrenals/Urinary Tract: Adrenal glands are unremarkable. Kidneys are normal, without renal calculi, focal lesion, or hydronephrosis. Bladder is unremarkable. Stomach/Bowel: Stomach is within normal limits. Appendix appears normal. No evidence of bowel wall thickening, distention, or inflammatory changes. Mild sigmoid diverticular disease but no evidence for wall thickening or acute inflammation. Vascular/Lymphatic: No significant vascular findings are present. No enlarged abdominal or pelvic lymph nodes. Reproductive: Status post hysterectomy. No adnexal masses. Other: Surgical clips in the right hemipelvis. No free air or free fluid. Musculoskeletal: No acute or significant osseous findings. IMPRESSION: No CT evidence for acute intra-abdominal or pelvic pathology. Sigmoid diverticulosis without evidence for diverticulitis Electronically Signed   By: Donavan Foil M.D.   On: 10/23/2016 19:25     Procedures Procedures (including critical care time)  Medications Ordered in ED Medications  HYDROmorphone (DILAUDID) injection 1 mg (1 mg Intravenous Given 10/23/16 1851)  sodium chloride 0.9 % bolus 1,000 mL (0 mLs Intravenous Stopped 10/23/16 2013)  ondansetron (ZOFRAN) injection 4 mg (4 mg Intravenous Given 10/23/16 1851)  acetaminophen (TYLENOL) tablet 650 mg (650 mg Oral Given 10/23/16 1856)  sodium chloride 0.9 % bolus 1,000 mL (1,000 mLs Intravenous New Bag/Given 10/23/16 2011)  potassium chloride SA (K-DUR,KLOR-CON) CR tablet 40 mEq (40 mEq Oral Given 10/23/16 2011)     Initial Impression / Assessment and Plan / ED Course  I have reviewed the triage vital signs and the nursing notes.  Pertinent labs & imaging results that were available during my care of the patient were reviewed by me and considered in my medical decision making (see chart for details).  Clinical Course as of Oct 23 2122  Nancy Fetter Oct 23, 2016  1836 Given worsening and more generalized pain, will get labs, CT. IV fluids, dilaudid and zofran. No peritonitis.   [SG]    Clinical Course User Index [SG] Sherwood Gambler, MD    Dr. Olevia Bowens to admit. CT without obvious pathology. Likely gastroenteritis. Now has AKI, hypokalemia. Pain much better, will need fluids and correction of electrolytes.   Final Clinical Impressions(s) / ED Diagnoses   Final diagnoses:  Acute kidney injury ([place])  Hypokalemia    New Prescriptions New Prescriptions   No medications on file     Sherwood Gambler, MD 10/23/16 2123

## 2016-10-23 NOTE — ED Triage Notes (Signed)
Pt with continued abd pain with N/V/D since Wednesday.  Seen on Wednesday for same.

## 2016-10-24 DIAGNOSIS — E871 Hypo-osmolality and hyponatremia: Secondary | ICD-10-CM | POA: Diagnosis not present

## 2016-10-24 DIAGNOSIS — K529 Noninfective gastroenteritis and colitis, unspecified: Secondary | ICD-10-CM | POA: Diagnosis not present

## 2016-10-24 DIAGNOSIS — N179 Acute kidney failure, unspecified: Secondary | ICD-10-CM | POA: Diagnosis not present

## 2016-10-24 DIAGNOSIS — I10 Essential (primary) hypertension: Secondary | ICD-10-CM | POA: Diagnosis not present

## 2016-10-24 LAB — COMPREHENSIVE METABOLIC PANEL
ALBUMIN: 3.3 g/dL — AB (ref 3.5–5.0)
ALK PHOS: 45 U/L (ref 38–126)
ALT: 10 U/L — AB (ref 14–54)
ANION GAP: 8 (ref 5–15)
AST: 13 U/L — ABNORMAL LOW (ref 15–41)
BUN: 25 mg/dL — ABNORMAL HIGH (ref 6–20)
CALCIUM: 8.5 mg/dL — AB (ref 8.9–10.3)
CO2: 22 mmol/L (ref 22–32)
CREATININE: 1.15 mg/dL — AB (ref 0.44–1.00)
Chloride: 103 mmol/L (ref 101–111)
GFR calc Af Amer: >60 mL/min (ref >60)
GFR calc non Af Amer: 53 mL/min — ABNORMAL LOW (ref >60)
GLUCOSE: 161 mg/dL — AB (ref 65–99)
Potassium: 4.1 mmol/L (ref 3.5–5.1)
SODIUM: 133 mmol/L — AB (ref 135–145)
Total Bilirubin: 0.8 mg/dL (ref 0.3–1.2)
Total Protein: 7.1 g/dL (ref 6.5–8.1)

## 2016-10-24 LAB — CBC WITH DIFFERENTIAL/PLATELET
BASOS ABS: 0 10*3/uL (ref 0.0–0.1)
Basophils Relative: 0 %
EOS ABS: 0 10*3/uL (ref 0.0–0.7)
Eosinophils Relative: 0 %
HCT: 38.6 % (ref 36.0–46.0)
HEMOGLOBIN: 12.9 g/dL (ref 12.0–15.0)
Lymphocytes Relative: 36 %
Lymphs Abs: 5.1 10*3/uL — ABNORMAL HIGH (ref 0.7–4.0)
MCH: 22.3 pg — ABNORMAL LOW (ref 26.0–34.0)
MCHC: 33.4 g/dL (ref 30.0–36.0)
MCV: 66.7 fL — ABNORMAL LOW (ref 78.0–100.0)
MONO ABS: 1.1 10*3/uL — AB (ref 0.1–1.0)
Monocytes Relative: 8 %
NEUTROS PCT: 56 %
Neutro Abs: 7.9 10*3/uL — ABNORMAL HIGH (ref 1.7–7.7)
Platelets: 212 10*3/uL (ref 150–400)
RBC: 5.79 MIL/uL — AB (ref 3.87–5.11)
RDW: 16.2 % — ABNORMAL HIGH (ref 11.5–15.5)
WBC: 14.1 10*3/uL — AB (ref 4.0–10.5)

## 2016-10-24 LAB — GLUCOSE, CAPILLARY
GLUCOSE-CAPILLARY: 157 mg/dL — AB (ref 65–99)
GLUCOSE-CAPILLARY: 87 mg/dL (ref 65–99)
Glucose-Capillary: 154 mg/dL — ABNORMAL HIGH (ref 65–99)
Glucose-Capillary: 150 mg/dL — ABNORMAL HIGH (ref 65–99)

## 2016-10-24 MED ORDER — SODIUM CHLORIDE 0.9 % IV SOLN
INTRAVENOUS | Status: DC
  Administered 2016-10-24 – 2016-10-26 (×4): via INTRAVENOUS

## 2016-10-24 MED ORDER — SODIUM CHLORIDE 0.9 % IV SOLN
INTRAVENOUS | Status: DC
  Filled 2016-10-24 (×4): qty 1000

## 2016-10-24 NOTE — Progress Notes (Signed)
[facility]                                                                              Patient Demographics  Shawna Huerta, is a 55 y.o. female, DOB - 07-14-1961, [id number]  Admit date - 10/23/2016   Admitting Physician Reubin Milan, MD  Outpatient Primary MD for the patient is Glenda Chroman, MD  Outpatient specialists:   LOS - 0  days    Chief Complaint  Patient presents with  . Abdominal Pain       Brief summary   Patient is a 55 year old female with anemia, arthritis, Bartholin's cyst excision, colitis, hypertension, tobacco use disorder, previous acute pancreatitis episode in 09/2015 presented to ED with abdominal pain, nausea and vomiting for last 5 days prior to admission.  Per patient, since Wednesday morning after she ate some yogurt, then started having nusea with over 10 episodes of emesis daily, associated with multiple episodes of diarrhea and diffuse abdominal pain. She had been unable to drink or eat without treated and vomiting. She denied fever, chills, but felt tired, fatigued and lightheaded. No sick contacts or travel history.   Assessment & Plan     Acute gastroenteritis - Likely viral gastroenteritis. Improving - CT abdomen and pelvis negative for any infectious colitis. Patient has had no diarrhea since admission and stool studies are all pending. - Leukocytosis trending down. No fevers or chills. - Follow stool studies if patient has any stool samples - Overall his symptoms are improving, hence started on full liquid diet today, symptomatic treatment, continue gentle hydration - Check C. difficile, rotavirus and Norovirus in stool. Fecal leukocytes    Hyponatremia - Improving Secondary to GI losses. Continue normal saline infusion.   Acute kidney injury -Creatinine 1.8 at the time of admission, likely due to dehydration from #1 - Improving, continue gentle hydration     Essential hypertension Currently not on  antihypertensives. Monitor blood pressure closely while in the hospital. Follow-up with PCP as an outpatient.    Tobacco abuse Nicotine replacement therapy as needed. Smoking cessation info provided.    IBS (irritable bowel syndrome) Resume Bentyl once tolerating oral intake.      Type 2 diabetes mellitus (HCC) CBG monitoring with sensitivity to regular insulin sliding scale.   Code Status: Full CODE STATUS  DVT Prophylaxis: SCDs  Family Communication: Discussed in detail with the patient, all imaging results, lab results explained to the patient   Disposition Plan: Hopefully tomorrow if tolerating diet  Time Spent in minutes  28minutes  Procedures:  CT abdomen  Consultants:   None   Antimicrobials:      Medications  Scheduled Meds: . famotidine (PEPCID) IV  20 mg Intravenous Q12H  . insulin aspart  0-9 Units Subcutaneous TID WC  . sodium chloride flush  3 mL Intravenous Q12H   Continuous Infusions: . sodium chloride 100 mL/hr at 10/24/16 1056   PRN Meds:.HYDROmorphone (DILAUDID) injection, nicotine, ondansetron (ZOFRAN) IV   Antibiotics   Anti-infectives    None        Subjective:   Shawna Huerta was seen and examined today.  Overall she  feels that she is improving, no nausea or vomiting. No significant abdominal pain. Diarrhea improving. Patient denies dizziness, chest pain, shortness of breath, new weakness, numbess, tingling. No acute events overnight.    Objective:   Vitals:   10/23/16 2130 10/23/16 2238 10/23/16 2248 10/24/16 0500  BP: 132/74 132/65  (!) 144/65  Pulse:  84  80  Resp: 14 16  16   Temp:  98.3 F (36.8 C)  97.8 F (36.6 C)  TempSrc:  Oral  Oral  SpO2:  99% 99% 99%  Weight:  109 kg (240 lb 6.4 oz)  110.2 kg (243 lb)  Height:  5\' 7"  (1.702 m)     No intake or output data in the 24 hours ending 10/24/16 1215   Wt Readings from Last 3 Encounters:  10/24/16 110.2 kg (243 lb)  10/19/16 115.7 kg (255 lb)  10/12/15 119.4 kg  (263 lb 4.8 oz)     Exam  General: Alert and oriented x 3, NAD  HEENT:  PERRLA, EOMI, Anicteric Sclera, mucous membranes moist.   Neck: Supple, no JVD, no masses  Cardiovascular: S1 S2 auscultated, no rubs, murmurs or gallops. Regular rate and rhythm.  Respiratory: Clear to auscultation bilaterally, no wheezing, rales or rhonchi  Gastrointestinal: Soft, nontender, nondistended, + bowel sounds  Ext: no cyanosis clubbing or edema  Neuro: AAOx3, Cr N's II- XII. Strength 5/5 upper and lower extremities bilaterally  Skin: No rashes  Psych: Normal affect and demeanor, alert and oriented x3    Data Reviewed:  I have personally reviewed following labs and imaging studies  Micro Results No results found for this or any previous visit (from the past 240 hour(s)).  Radiology Reports Ct Abdomen Pelvis Wo Contrast  Result Date: 10/23/2016 CLINICAL DATA:  History of diabetes and hypertension presents with nausea and nonbloody emesis and diarrhea and lower abdominal pain EXAM: CT ABDOMEN AND PELVIS WITHOUT CONTRAST TECHNIQUE: Multidetector CT imaging of the abdomen and pelvis was performed following the standard protocol without IV contrast. COMPARISON:  10/10/2015 FINDINGS: Lower chest: No acute consolidation or pleural effusion. Heart size normal. Hepatobiliary: No focal hepatic abnormality. Surgical clips in the gallbladder fossa consistent with prior cholecystectomy. No biliary dilatation. Pancreas: Unremarkable. No pancreatic ductal dilatation or surrounding inflammatory changes. Spleen: Normal in size without focal abnormality. Adrenals/Urinary Tract: Adrenal glands are unremarkable. Kidneys are normal, without renal calculi, focal lesion, or hydronephrosis. Bladder is unremarkable. Stomach/Bowel: Stomach is within normal limits. Appendix appears normal. No evidence of bowel wall thickening, distention, or inflammatory changes. Mild sigmoid diverticular disease but no evidence for wall  thickening or acute inflammation. Vascular/Lymphatic: No significant vascular findings are present. No enlarged abdominal or pelvic lymph nodes. Reproductive: Status post hysterectomy. No adnexal masses. Other: Surgical clips in the right hemipelvis. No free air or free fluid. Musculoskeletal: No acute or significant osseous findings. IMPRESSION: No CT evidence for acute intra-abdominal or pelvic pathology. Sigmoid diverticulosis without evidence for diverticulitis Electronically Signed   By: Donavan Foil M.D.   On: 10/23/2016 19:25    Lab Data:  CBC:  Recent Labs Lab 10/19/16 2245 10/23/16 1839 10/24/16 0451  WBC 12.1* 17.9* 14.1*  NEUTROABS  --  10.9* 7.9*  HGB 11.4* 16.1* 12.9  HCT 34.2* 46.6* 38.6  MCV 65.8* 65.9* 66.7*  PLT 232 267 [id number]   Basic Metabolic Panel:  Recent Labs Lab 10/19/16 2208 10/23/16 1839 10/24/16 0451  NA 130* 127* 133*  K 4.3 3.0* 4.1  CL 97* 91* 103  CO2 22  21* 22  GLUCOSE 229* 228* 161*  BUN 7 29* 25*  CREATININE 0.78 1.80* 1.15*  CALCIUM 9.6 10.1 8.5*  MG  --  2.0  --    GFR: Estimated Creatinine Clearance: 72.3 mL/min (by C-G formula based on SCr of 1.15 mg/dL (H)). Liver Function Tests:  Recent Labs Lab 10/19/16 2208 10/23/16 1839 10/24/16 0451  AST 30 18 13*  ALT 20 14 10*  ALKPHOS 64 61 45  BILITOT 0.3 0.7 0.8  PROT 9.0* 9.9* 7.1  ALBUMIN 4.2 4.5 3.3*    Recent Labs Lab 10/19/16 2208 10/23/16 1839  LIPASE 185* 59*   No results for input(s): AMMONIA in the last 168 hours. Coagulation Profile: No results for input(s): INR, PROTIME in the last 168 hours. Cardiac Enzymes: No results for input(s): CKTOTAL, CKMB, CKMBINDEX, TROPONINI in the last 168 hours. BNP (last 3 results) No results for input(s): PROBNP in the last 8760 hours. HbA1C: No results for input(s): HGBA1C in the last 72 hours. CBG:  Recent Labs Lab 10/24/16 0828  GLUCAP 154*   Lipid Profile: No results for input(s): CHOL, HDL, LDLCALC, TRIG, CHOLHDL,  LDLDIRECT in the last 72 hours. Thyroid Function Tests: No results for input(s): TSH, T4TOTAL, FREET4, T3FREE, THYROIDAB in the last 72 hours. Anemia Panel: No results for input(s): VITAMINB12, FOLATE, FERRITIN, TIBC, IRON, RETICCTPCT in the last 72 hours. Urine analysis:    Component Value Date/Time   COLORURINE YELLOW 10/23/2016 [place] 10/23/2016 2215   LABSPEC 1.010 10/23/2016 2215   PHURINE 5.5 10/23/2016 2215   GLUCOSEU NEGATIVE 10/23/2016 2215   HGBUR SMALL (A) 10/23/2016 2215   BILIRUBINUR NEGATIVE 10/23/2016 2215   KETONESUR NEGATIVE 10/23/2016 2215   PROTEINUR TRACE (A) 10/23/2016 2215   UROBILINOGEN 0.2 10/10/2015 1446   NITRITE NEGATIVE 10/23/2016 2215   LEUKOCYTESUR NEGATIVE 10/23/2016 2215     Kedric Bumgarner M.D. [facility] 10/24/2016, 12:15 PM  Pager: [phone] Between 7am to 7pm - call Pager - [phone]  After 7pm go to [email or web address] - password [email or web address]  Call night coverage person covering after 7pm

## 2016-10-25 ENCOUNTER — Encounter (HOSPITAL_COMMUNITY): Payer: Self-pay | Admitting: Gastroenterology

## 2016-10-25 DIAGNOSIS — Z7984 Long term (current) use of oral hypoglycemic drugs: Secondary | ICD-10-CM | POA: Diagnosis not present

## 2016-10-25 DIAGNOSIS — Z87891 Personal history of nicotine dependence: Secondary | ICD-10-CM | POA: Diagnosis not present

## 2016-10-25 DIAGNOSIS — Z833 Family history of diabetes mellitus: Secondary | ICD-10-CM | POA: Diagnosis not present

## 2016-10-25 DIAGNOSIS — D509 Iron deficiency anemia, unspecified: Secondary | ICD-10-CM | POA: Diagnosis present

## 2016-10-25 DIAGNOSIS — E860 Dehydration: Secondary | ICD-10-CM | POA: Diagnosis present

## 2016-10-25 DIAGNOSIS — K589 Irritable bowel syndrome without diarrhea: Secondary | ICD-10-CM | POA: Diagnosis present

## 2016-10-25 DIAGNOSIS — K529 Noninfective gastroenteritis and colitis, unspecified: Secondary | ICD-10-CM | POA: Diagnosis not present

## 2016-10-25 DIAGNOSIS — E871 Hypo-osmolality and hyponatremia: Secondary | ICD-10-CM | POA: Diagnosis not present

## 2016-10-25 DIAGNOSIS — E1143 Type 2 diabetes mellitus with diabetic autonomic (poly)neuropathy: Secondary | ICD-10-CM | POA: Diagnosis present

## 2016-10-25 DIAGNOSIS — I10 Essential (primary) hypertension: Secondary | ICD-10-CM | POA: Diagnosis not present

## 2016-10-25 DIAGNOSIS — R112 Nausea with vomiting, unspecified: Secondary | ICD-10-CM

## 2016-10-25 DIAGNOSIS — E876 Hypokalemia: Secondary | ICD-10-CM | POA: Diagnosis present

## 2016-10-25 DIAGNOSIS — Z823 Family history of stroke: Secondary | ICD-10-CM | POA: Diagnosis not present

## 2016-10-25 DIAGNOSIS — N179 Acute kidney failure, unspecified: Secondary | ICD-10-CM | POA: Diagnosis not present

## 2016-10-25 DIAGNOSIS — E1142 Type 2 diabetes mellitus with diabetic polyneuropathy: Secondary | ICD-10-CM | POA: Diagnosis present

## 2016-10-25 DIAGNOSIS — A084 Viral intestinal infection, unspecified: Secondary | ICD-10-CM | POA: Diagnosis present

## 2016-10-25 DIAGNOSIS — K210 Gastro-esophageal reflux disease with esophagitis: Secondary | ICD-10-CM | POA: Diagnosis present

## 2016-10-25 DIAGNOSIS — K3189 Other diseases of stomach and duodenum: Secondary | ICD-10-CM | POA: Diagnosis not present

## 2016-10-25 DIAGNOSIS — K3184 Gastroparesis: Secondary | ICD-10-CM | POA: Diagnosis present

## 2016-10-25 LAB — GLUCOSE, CAPILLARY
GLUCOSE-CAPILLARY: 113 mg/dL — AB (ref 65–99)
Glucose-Capillary: 144 mg/dL — ABNORMAL HIGH (ref 65–99)
Glucose-Capillary: 114 mg/dL — ABNORMAL HIGH (ref 65–99)
Glucose-Capillary: 117 mg/dL — ABNORMAL HIGH (ref 65–99)

## 2016-10-25 LAB — VITAMIN B12: Vitamin B-12: 215 pg/mL (ref 180–914)

## 2016-10-25 LAB — CBC WITH DIFFERENTIAL/PLATELET
BASOS PCT: 0 %
Basophils Absolute: 0 10*3/uL (ref 0.0–0.1)
EOS PCT: 1 %
Eosinophils Absolute: 0.1 10*3/uL (ref 0.0–0.7)
HCT: 35.4 % — ABNORMAL LOW (ref 36.0–46.0)
HEMOGLOBIN: 11.4 g/dL — AB (ref 12.0–15.0)
LYMPHS PCT: 45 %
Lymphs Abs: 5.4 10*3/uL — ABNORMAL HIGH (ref 0.7–4.0)
MCH: 22 pg — AB (ref 26.0–34.0)
MCHC: 32.2 g/dL (ref 30.0–36.0)
MCV: 68.2 fL — AB (ref 78.0–100.0)
MONO ABS: 0.7 10*3/uL (ref 0.1–1.0)
Monocytes Relative: 6 %
NEUTROS PCT: 48 %
Neutro Abs: 5.9 10*3/uL (ref 1.7–7.7)
Platelets: 199 10*3/uL (ref 150–400)
RBC: 5.19 MIL/uL — ABNORMAL HIGH (ref 3.87–5.11)
RDW: 16.6 % — ABNORMAL HIGH (ref 11.5–15.5)
WBC: 12.1 10*3/uL — ABNORMAL HIGH (ref 4.0–10.5)

## 2016-10-25 LAB — RETICULOCYTES
RBC.: 4.72 MIL/uL (ref 3.87–5.11)
RETIC COUNT ABSOLUTE: 28.3 10*3/uL (ref 19.0–186.0)
Retic Ct Pct: 0.6 % (ref 0.4–3.1)

## 2016-10-25 LAB — BASIC METABOLIC PANEL
Anion gap: 6 (ref 5–15)
BUN: 11 mg/dL (ref 6–20)
CHLORIDE: 104 mmol/L (ref 101–111)
CO2: 23 mmol/L (ref 22–32)
CREATININE: 0.73 mg/dL (ref 0.44–1.00)
Calcium: 8.3 mg/dL — ABNORMAL LOW (ref 8.9–10.3)
Glucose, Bld: 109 mg/dL — ABNORMAL HIGH (ref 65–99)
POTASSIUM: 3.7 mmol/L (ref 3.5–5.1)
SODIUM: 133 mmol/L — AB (ref 135–145)

## 2016-10-25 LAB — IRON AND TIBC
IRON: 96 ug/dL (ref 28–170)
SATURATION RATIOS: 46 % — AB (ref 10.4–31.8)
TIBC: 210 ug/dL — ABNORMAL LOW (ref 250–450)
UIBC: 114 ug/dL

## 2016-10-25 LAB — FERRITIN: FERRITIN: 163 ng/mL (ref 11–307)

## 2016-10-25 LAB — FOLATE: Folate: 7 ng/mL (ref >5.9)

## 2016-10-25 MED ORDER — ONDANSETRON HCL 4 MG/2ML IJ SOLN
4.0000 mg | Freq: Three times a day (TID) | INTRAMUSCULAR | Status: DC
  Administered 2016-10-25 – 2016-10-27 (×8): 4 mg via INTRAVENOUS
  Filled 2016-10-25 (×9): qty 2

## 2016-10-25 MED ORDER — PANTOPRAZOLE SODIUM 40 MG PO TBEC
40.0000 mg | DELAYED_RELEASE_TABLET | Freq: Two times a day (BID) | ORAL | Status: DC
  Administered 2016-10-25 – 2016-10-27 (×3): 40 mg via ORAL
  Filled 2016-10-25 (×4): qty 1

## 2016-10-25 NOTE — Progress Notes (Signed)
[facility]                                                                              Patient Demographics  Shawna Huerta, is a 55 y.o. female, DOB - 29-Jun-1961, [id number]  Admit date - 10/23/2016   Admitting Physician Reubin Milan, MD  Outpatient Primary MD for the patient is Glenda Chroman, MD  Outpatient specialists:   LOS - 0  days    Chief Complaint  Patient presents with  . Abdominal Pain       Brief summary   Patient is a 55 year old female with anemia, arthritis, Bartholin's cyst excision, colitis, hypertension, tobacco use disorder, previous acute pancreatitis episode in 09/2015 presented to ED with abdominal pain, nausea and vomiting for last 5 days prior to admission.  Per patient, since Wednesday morning after she ate some yogurt, then started having nusea with over 10 episodes of emesis daily, associated with multiple episodes of diarrhea and diffuse abdominal pain. She had been unable to drink or eat without treated and vomiting. She denied fever, chills, but felt tired, fatigued and lightheaded. No sick contacts or travel history.   Assessment & Plan     Acute gastroenteritis: - Patient advanced to full liquid diet however started having vomiting. No diarrhea - Has not been successfully able to advance diet. Diet back down to clear liquids. - CT abdomen and pelvis negative for any infectious colitis. Patient has had no diarrhea since admission and stool studies are all pending. - Leukocytosis trending down. No fevers or chills. - GI consulted for further workup, may need endoscopy    Hyponatremia - Improving Secondary to GI losses. Continue normal saline infusion.  Acute kidney injury -Creatinine 1.8 at the time of admission, likely due to dehydration from #1 - Improving, continue gentle hydration     Essential hypertension Currently not on antihypertensives. Monitor blood pressure closely while in the hospital. Follow-up with  PCP as an outpatient.    Tobacco abuse Nicotine replacement therapy as needed. Smoking cessation info provided.    IBS (irritable bowel syndrome) Resume Bentyl once tolerating oral intake.      Type 2 diabetes mellitus (HCC) -Continue sensitive sliding scale   Code Status: Full CODE STATUS  DVT Prophylaxis: SCDs  Family Communication: Discussed in detail with the patient, all imaging results, lab results explained to the patient   Disposition Plan:  Time Spent in minutes  39minutes  Procedures:  CT abdomen  Consultants:   None   Antimicrobials:      Medications  Scheduled Meds: . insulin aspart  0-9 Units Subcutaneous TID WC  . ondansetron (ZOFRAN) IV  4 mg Intravenous TID AC & HS  . pantoprazole  40 mg Oral BID AC  . sodium chloride flush  3 mL Intravenous Q12H   Continuous Infusions: . sodium chloride 100 mL/hr at 10/25/16 0117   PRN Meds:.HYDROmorphone (DILAUDID) injection, nicotine, ondansetron (ZOFRAN) IV   Antibiotics   Anti-infectives    None        Subjective:   [name] was seen and examined today. Complaining of abdominal crampy pain with nausea and vomiting  worsened after starting full liquid diet, in the evening yesterday.  No diarrhea. No fevers or chills. Patient denies dizziness, chest pain, shortness of breath, new weakness, numbess, tingling. No acute events overnight.    Objective:   Vitals:   10/23/16 2248 10/24/16 0500 10/24/16 2100 10/25/16 0431  BP:  (!) 144/65 137/62 126/68  Pulse:  80 65 82  Resp:  16 20 19   Temp:  97.8 F (36.6 C) 98.5 F (36.9 C) 98.1 F (36.7 C)  TempSrc:  Oral Oral Oral  SpO2: 99% 99% 97% 100%  Weight:  110.2 kg (243 lb)  110.4 kg (243 lb 4.8 oz)  Height:        Intake/Output Summary (Last 24 hours) at 10/25/16 1352 Last data filed at 10/25/16 0851  Gross per 24 hour  Intake          2631.67 ml  Output              350 ml  Net          2281.67 ml     Wt Readings from Last 3 Encounters:   10/25/16 110.4 kg (243 lb 4.8 oz)  10/19/16 115.7 kg (255 lb)  10/12/15 119.4 kg (263 lb 4.8 oz)     Exam  General: Alert and oriented x 3, NAD  HEENT:    Neck:   Cardiovascular: S1 S2 auscultated, no rubs, murmurs or gallops. Regular rate and rhythm.  Respiratory: Clear to auscultation bilaterally, no wheezing, rales or rhonchi  Gastrointestinal: Soft,Mild periumbilical epigastric tenderness, nondistended, + bowel sounds  Ext: no cyanosis clubbing or edema  Neuro: AAOx3, Cr N's II- XII. Strength 5/5 upper and lower extremities bilaterally  Skin: No rashes  Psych: Normal affect and demeanor, alert and oriented x3    Data Reviewed:  I have personally reviewed following labs and imaging studies  Micro Results No results found for this or any previous visit (from the past 240 hour(s)).  Radiology Reports Ct Abdomen Pelvis Wo Contrast  Result Date: 10/23/2016 CLINICAL DATA:  History of diabetes and hypertension presents with nausea and nonbloody emesis and diarrhea and lower abdominal pain EXAM: CT ABDOMEN AND PELVIS WITHOUT CONTRAST TECHNIQUE: Multidetector CT imaging of the abdomen and pelvis was performed following the standard protocol without IV contrast. COMPARISON:  10/10/2015 FINDINGS: Lower chest: No acute consolidation or pleural effusion. Heart size normal. Hepatobiliary: No focal hepatic abnormality. Surgical clips in the gallbladder fossa consistent with prior cholecystectomy. No biliary dilatation. Pancreas: Unremarkable. No pancreatic ductal dilatation or surrounding inflammatory changes. Spleen: Normal in size without focal abnormality. Adrenals/Urinary Tract: Adrenal glands are unremarkable. Kidneys are normal, without renal calculi, focal lesion, or hydronephrosis. Bladder is unremarkable. Stomach/Bowel: Stomach is within normal limits. Appendix appears normal. No evidence of bowel wall thickening, distention, or inflammatory changes. Mild sigmoid diverticular  disease but no evidence for wall thickening or acute inflammation. Vascular/Lymphatic: No significant vascular findings are present. No enlarged abdominal or pelvic lymph nodes. Reproductive: Status post hysterectomy. No adnexal masses. Other: Surgical clips in the right hemipelvis. No free air or free fluid. Musculoskeletal: No acute or significant osseous findings. IMPRESSION: No CT evidence for acute intra-abdominal or pelvic pathology. Sigmoid diverticulosis without evidence for diverticulitis Electronically Signed   By: Donavan Foil M.D.   On: 10/23/2016 19:25    Lab Data:  CBC:  Recent Labs Lab 10/19/16 2245 10/23/16 1839 10/24/16 0451 10/25/16 0436  WBC 12.1* 17.9* 14.1* 12.1*  NEUTROABS  --  10.9* 7.9* 5.9  HGB 11.4* 16.1* 12.9 11.4*  HCT 34.2* 46.6* 38.6 35.4*  MCV 65.8* 65.9* 66.7* 68.2*  PLT 232 267 212 [id number]   Basic Metabolic Panel:  Recent Labs Lab 10/19/16 2208 10/23/16 1839 10/24/16 0451 10/25/16 0436  NA 130* 127* 133* 133*  K 4.3 3.0* 4.1 3.7  CL 97* 91* 103 104  CO2 22 21* 22 23  GLUCOSE 229* 228* 161* 109*  BUN 7 29* 25* 11  CREATININE 0.78 1.80* 1.15* 0.73  CALCIUM 9.6 10.1 8.5* 8.3*  MG  --  2.0  --   --    GFR: Estimated Creatinine Clearance: 104.1 mL/min (by C-G formula based on SCr of 0.73 mg/dL). Liver Function Tests:  Recent Labs Lab 10/19/16 2208 10/23/16 1839 10/24/16 0451  AST 30 18 13*  ALT 20 14 10*  ALKPHOS 64 61 45  BILITOT 0.3 0.7 0.8  PROT 9.0* 9.9* 7.1  ALBUMIN 4.2 4.5 3.3*    Recent Labs Lab 10/19/16 2208 10/23/16 1839  LIPASE 185* 59*   No results for input(s): AMMONIA in the last 168 hours. Coagulation Profile: No results for input(s): INR, PROTIME in the last 168 hours. Cardiac Enzymes: No results for input(s): CKTOTAL, CKMB, CKMBINDEX, TROPONINI in the last 168 hours. BNP (last 3 results) No results for input(s): PROBNP in the last 8760 hours. HbA1C: No results for input(s): HGBA1C in the last 72  hours. CBG:  Recent Labs Lab 10/24/16 1235 10/24/16 1654 10/24/16 2210 10/25/16 0827 10/25/16 1159  GLUCAP 150* 157* 87 113* 144*   Lipid Profile: No results for input(s): CHOL, HDL, LDLCALC, TRIG, CHOLHDL, LDLDIRECT in the last 72 hours. Thyroid Function Tests: No results for input(s): TSH, T4TOTAL, FREET4, T3FREE, THYROIDAB in the last 72 hours. Anemia Panel: No results for input(s): VITAMINB12, FOLATE, FERRITIN, TIBC, IRON, RETICCTPCT in the last 72 hours. Urine analysis:    Component Value Date/Time   COLORURINE YELLOW 10/23/2016 [place] 10/23/2016 2215   LABSPEC 1.010 10/23/2016 2215   PHURINE 5.5 10/23/2016 2215   GLUCOSEU NEGATIVE 10/23/2016 2215   HGBUR SMALL (A) 10/23/2016 2215   BILIRUBINUR NEGATIVE 10/23/2016 2215   KETONESUR NEGATIVE 10/23/2016 2215   PROTEINUR TRACE (A) 10/23/2016 2215   UROBILINOGEN 0.2 10/10/2015 1446   NITRITE NEGATIVE 10/23/2016 2215   LEUKOCYTESUR NEGATIVE 10/23/2016 2215     Prudie Guthridge M.D. [facility] 10/25/2016, 1:52 PM  Pager: [phone] Between 7am to 7pm - call Pager - [phone]  After 7pm go to [email or web address] - password [email or web address]  Call night coverage person covering after 7pm

## 2016-10-25 NOTE — Consult Note (Signed)
Referring Provider: Dr. Tana Coast  Primary Care Physician:  Glenda Chroman, MD/Keavie Weyman Rodney, NP  Primary Gastroenterologist:  Dr. Britta Mccreedy   Date of Admission: 10/23/16 Date of Consultation: 10/25/16  Reason for Consultation:  Persistent nausea and vomiting, inability to advance diet   HPI:  Shawna Huerta is a 55 y.o. year old female who presented to the ED with abdominal pain, nausea, vomiting, and diarrhea, felt to have viral gastroenteritis. CT abd/pelvis unrevealing. Lipase 185 several days prior to admission, with admitting lipase 59. History of pancreatitis in Nov 2016, unknown etiology. CT without contrast at that time with unremarkable pancreas. Denies ETOH use. Last colonoscopy a year ago by Dr. Britta Mccreedy with multiple polyps (12 per patient), stating she is due for early interval surveillance this year. No prior upper endoscopy. Father died when patient was 74 with cancer; patient states he had "stomach, liver, and colon". Unknown primary.   Will eat a few bites of soft food and abdomen "balls up", then vomits. Unable to advance diet. Upper abdominal cramping with eating and intermittently. States it is not "pain" but more of a cramping sensation. Nausea all the time. No dysphagia. Intermittent reflux but "not all the time". States she has been anemic "all my life". Prescribed Ibuprofen last Wednesday. No NSAIDs or aspirin powders prior to last Wednesday. States her normal baseline is a BM daily, no abdominal pain. States only episodes of diarrhea have occurred when admitted to hospital; otherwise she does well.   Acute onset of symptoms Wednesday. Diarrhea resolved.   Past Medical History:  Diagnosis Date  . Anemia   . Arthritis   . Bartholin cyst 07/03/2014  . Colitis 08/2015   Treated at [facility]  . Diabetes mellitus   . Hypertension   . Labial abscess 08/2015   Status post I&D by Dr. Ladona Horns  . Nicotine addiction 09/03/2013  . Obesity   . Vaginal itching 09/03/2013    Past  Surgical History:  Procedure Laterality Date  . ABDOMINAL HYSTERECTOMY    . ABDOMINAL SURGERY     exploratry with TAH  . BARTHOLIN GLAND CYST EXCISION Right 08/20/2014   Procedure: EXCISION OF RIGHT BARTHOLIN'S GLAND CYST;  Surgeon: Florian Buff, MD;  Location: [facility];  Service: Gynecology;  Laterality: Right;  . CHOLECYSTECTOMY    . FOOT SURGERY    . FOOT SURGERY Right 2011   shave bone  . TONSILLECTOMY      Prior to Admission medications   Medication Sig Start Date End Date Taking? Authorizing Provider  dicyclomine (BENTYL) 20 MG tablet Take 1 tablet (20 mg total) by mouth 2 (two) times daily. 10/20/16  Yes Kinnie Feil, PA-C  ferrous sulfate 325 (65 FE) MG EC tablet Take 325 mg by mouth daily with breakfast.   Yes Historical Provider, MD  glyBURIDE-metformin (GLUCOVANCE) 5-500 MG per tablet Take 1 tablet by mouth 2 (two) times daily.   Yes Historical Provider, MD  ibuprofen (ADVIL,MOTRIN) 600 MG tablet Take 1 tablet (600 mg total) by mouth every 6 (six) hours as needed. 10/20/16  Yes Kinnie Feil, PA-C  lisinopril (PRINIVIL,ZESTRIL) 10 MG tablet Take 10 mg by mouth daily. 03/12/15  Yes Historical Provider, MD  ondansetron (ZOFRAN ODT) 4 MG disintegrating tablet Take 1 tablet (4 mg total) by mouth every 8 (eight) hours as needed for nausea or vomiting. 10/20/16  Yes Kinnie Feil, PA-C  ondansetron (ZOFRAN) 4 mg TABS tablet Take 4 tablets by mouth every 8 (eight) hours as needed.  Please provide bottle to go 10/20/16  Yes Kinnie Feil, PA-C    Current Facility-Administered Medications  Medication Dose Route Frequency Provider Last Rate Last Dose  . 0.9 %  sodium chloride infusion   Intravenous Continuous Ripudeep Krystal Eaton, MD 100 mL/hr at 10/25/16 0117    . famotidine (PEPCID) IVPB 20 mg premix  20 mg Intravenous Q12H Reubin Milan, MD   20 mg at 10/25/16 0851  . HYDROmorphone (DILAUDID) injection 1 mg  1 mg Intravenous Q4H PRN Reubin Milan, MD   1 mg at  10/25/16 [id number]  . insulin aspart (novoLOG) injection 0-9 Units  0-9 Units Subcutaneous TID WC Reubin Milan, MD   2 Units at 10/24/16 1812  . nicotine (NICODERM CQ - dosed in mg/24 hours) patch 21 mg  21 mg Transdermal Daily PRN Reubin Milan, MD      . ondansetron [facility]) injection 4 mg  4 mg Intravenous Q6H PRN Reubin Milan, MD   4 mg at 10/24/16 1232  . sodium chloride flush (NS) 0.9 % injection 3 mL  3 mL Intravenous Q12H Reubin Milan, MD   3 mL at 10/24/16 2200    Allergies as of 10/23/2016 - Review Complete 10/23/2016  Allergen Reaction Noted  . Iohexol Swelling 03/27/2008    Family History  Problem Relation Age of Onset  . Diabetes Mother   . Stroke Mother   . Cancer Father   . Diabetes Sister   . Cancer Brother   . Diabetes Brother     Social History   Social History  . Marital status: Legally Separated    Spouse name: N/A  . Number of children: N/A  . Years of education: N/A   Occupational History  . Not on file.   Social History Main Topics  . Smoking status: Former Smoker    Packs/day: 0.25    Years: 3.00    Types: Cigarettes    Quit date: 10/21/2016  . Smokeless tobacco: Never Used  . Alcohol use No     Comment: occ.  . Drug use: No  . Sexual activity: Not Currently    Birth control/ protection: Surgical   Other Topics Concern  . Not on file   Social History Narrative  . No narrative on file    Review of Systems: Gen: see HPI  CV: Denies chest pain, heart palpitations, syncope, edema  Resp: Denies shortness of breath with rest, cough, wheezing GI: see HPI  GU : Denies urinary burning, urinary frequency, urinary incontinence.  MS: Denies joint pain,swelling, cramping Derm: Denies rash, itching, dry skin Psych: Denies depression, anxiety,confusion, or memory loss Heme: Denies bruising, bleeding, and enlarged lymph nodes.  Physical Exam: Vital signs in last 24 hours: Temp:  [98.1 F (36.7 C)-98.5 F (36.9 C)] 98.1 F  (36.7 C) (11/28 0431) Pulse Rate:  [65-82] 82 (11/28 0431) Resp:  [19-20] 19 (11/28 0431) BP: (126-137)/(62-68) 126/68 (11/28 0431) SpO2:  [97 %-100 %] 100 % (11/28 0431) Weight:  [243 lb 4.8 oz (110.4 kg)] 243 lb 4.8 oz (110.4 kg) (11/28 0431) Last BM Date: 10/23/16 General:   Alert,  Well-developed, well-nourished, pleasant and cooperative in NAD Head:  Normocephalic and atraumatic. Eyes:  Sclera clear, no icterus.   Conjunctiva pink. Ears:  Normal auditory acuity. Nose:  No deformity, discharge,  or lesions. Lungs:  Clear throughout to auscultation.   No wheezes, crackles, or rhonchi. No acute distress. Heart:  Regular rate and rhythm; no murmurs, clicks,  rubs,  or gallops. Abdomen:  Soft, mild to moderate TTP epigastric and LUQ and nondistended. No masses, hepatosplenomegaly or hernias noted. Normal bowel sounds, without guarding, and without rebound.   Rectal:  Deferred  Msk:  Symmetrical without gross deformities. Normal posture. Extremities:  Mild pedal edema  Neurologic:  Alert and  oriented x4 Psych:  Alert and cooperative. Normal mood and affect.  Intake/Output from previous day: 11/27 0701 - 11/28 0700 In: 1825 [P.O.:240; I.V.:1435; IV Piggyback:150] Out: 350 [Urine:350] Intake/Output this shift: Total I/O In: 806.7 [I.V.:756.7; IV Piggyback:50] Out: -   Lab Results:  Recent Labs  10/23/16 1839 10/24/16 0451 10/25/16 0436  WBC 17.9* 14.1* 12.1*  HGB 16.1* 12.9 11.4*  HCT 46.6* 38.6 35.4*  PLT 267 212 199   BMET  Recent Labs  10/23/16 1839 10/24/16 0451 10/25/16 0436  NA 127* 133* 133*  K 3.0* 4.1 3.7  CL 91* 103 104  CO2 21* 22 23  GLUCOSE 228* 161* 109*  BUN 29* 25* 11  CREATININE 1.80* 1.15* 0.73  CALCIUM 10.1 8.5* 8.3*   LFT  Recent Labs  10/23/16 1839 10/24/16 0451  PROT 9.9* 7.1  ALBUMIN 4.5 3.3*  AST 18 13*  ALT 14 10*  ALKPHOS 61 45  BILITOT 0.7 0.8    Studies/Results: Ct Abdomen Pelvis Wo Contrast  Result Date:  10/23/2016 CLINICAL DATA:  History of diabetes and hypertension presents with nausea and nonbloody emesis and diarrhea and lower abdominal pain EXAM: CT ABDOMEN AND PELVIS WITHOUT CONTRAST TECHNIQUE: Multidetector CT imaging of the abdomen and pelvis was performed following the standard protocol without IV contrast. COMPARISON:  10/10/2015 FINDINGS: Lower chest: No acute consolidation or pleural effusion. Heart size normal. Hepatobiliary: No focal hepatic abnormality. Surgical clips in the gallbladder fossa consistent with prior cholecystectomy. No biliary dilatation. Pancreas: Unremarkable. No pancreatic ductal dilatation or surrounding inflammatory changes. Spleen: Normal in size without focal abnormality. Adrenals/Urinary Tract: Adrenal glands are unremarkable. Kidneys are normal, without renal calculi, focal lesion, or hydronephrosis. Bladder is unremarkable. Stomach/Bowel: Stomach is within normal limits. Appendix appears normal. No evidence of bowel wall thickening, distention, or inflammatory changes. Mild sigmoid diverticular disease but no evidence for wall thickening or acute inflammation. Vascular/Lymphatic: No significant vascular findings are present. No enlarged abdominal or pelvic lymph nodes. Reproductive: Status post hysterectomy. No adnexal masses. Other: Surgical clips in the right hemipelvis. No free air or free fluid. Musculoskeletal: No acute or significant osseous findings. IMPRESSION: No CT evidence for acute intra-abdominal or pelvic pathology. Sigmoid diverticulosis without evidence for diverticulitis Electronically Signed   By: Donavan Foil M.D.   On: 10/23/2016 19:25    Impression: 55 year old female admitted with acute onset of abdominal pain, N/V, diarrhea, clinically improved with cessation of diarrhea but persistent nausea and vomiting. Unable to advance diet, with persistent nausea and vomiting after eating. Elevated lipase several days prior to admission with non-contrast CT  without signs of pancreatitis; as of note, last Nov 2016 was hospitalized for pancreatitis, unknown etiology. Admitting lipase close to normal.   May have post-viral delayed gastric emptying, unable to exclude gastritis, PUD. Clinically does not appear to have pancreatitis. Will provide symptomatic support with BID PPI and scheduled Zofran, with re-evaluation tomorrow. May need EGD if no significant improvement.   Diarrhea: resolved.   Chronic microcytic anemia: without GI bleeding, colonoscopy up-to-date as of last year by Dr. Britta Mccreedy with multiple polyps per patient; I do not have reports at time of consultation. Check anemia panel.   Plan:  PPI BID before meals Scheduled Zofran around the clock NPO after midnight. If no improvement with supportive measures, will consider EGD on 10/26/16 for diagnostic purposes Follow-up as outpatient with primary GI for colonoscopy timing Follow-up as outpatient for further evaluation of history of pancreatitis   Annitta Needs, ANP-BC [facility]         LOS: 0 days    10/25/2016, 10:03 AM

## 2016-10-26 ENCOUNTER — Encounter (HOSPITAL_COMMUNITY): Payer: Self-pay

## 2016-10-26 ENCOUNTER — Encounter (HOSPITAL_COMMUNITY)
Admission: EM | Disposition: A | Discharge status: Home / Self Care | Payer: Self-pay | Source: Home / Self Care | Attending: Internal Medicine

## 2016-10-26 HISTORY — PX: ESOPHAGOGASTRODUODENOSCOPY: SHX5428

## 2016-10-26 HISTORY — PX: BALLOON DILATION: SHX5330

## 2016-10-26 LAB — GLUCOSE, CAPILLARY
GLUCOSE-CAPILLARY: 122 mg/dL — AB (ref 65–99)
Glucose-Capillary: 93 mg/dL (ref 65–99)
Glucose-Capillary: 101 mg/dL — ABNORMAL HIGH (ref 65–99)
Glucose-Capillary: 84 mg/dL (ref 65–99)

## 2016-10-26 SURGERY — EGD (ESOPHAGOGASTRODUODENOSCOPY)
Anesthesia: Moderate Sedation

## 2016-10-26 MED ORDER — ONDANSETRON HCL 4 MG/2ML IJ SOLN
INTRAMUSCULAR | Status: DC | PRN
  Administered 2016-10-26: 4 mg via INTRAVENOUS

## 2016-10-26 MED ORDER — SODIUM CHLORIDE 0.9 % IV SOLN
INTRAVENOUS | Status: DC
  Administered 2016-10-26: 18:00:00 via INTRAVENOUS

## 2016-10-26 MED ORDER — LIDOCAINE VISCOUS 2 % MT SOLN
OROMUCOSAL | Status: DC | PRN
  Administered 2016-10-26: 1 via OROMUCOSAL

## 2016-10-26 MED ORDER — ONDANSETRON HCL 4 MG/2ML IJ SOLN
INTRAMUSCULAR | Status: AC
  Filled 2016-10-26: qty 2

## 2016-10-26 MED ORDER — MEPERIDINE HCL 100 MG/ML IJ SOLN
INTRAMUSCULAR | Status: DC | PRN
  Administered 2016-10-26 (×2): 50 mg via INTRAVENOUS

## 2016-10-26 MED ORDER — METOCLOPRAMIDE HCL 5 MG/ML IJ SOLN
5.0000 mg | Freq: Four times a day (QID) | INTRAMUSCULAR | Status: DC
  Administered 2016-10-26: 5 mg via INTRAVENOUS
  Filled 2016-10-26: qty 1
  Filled 2016-10-26: qty 2

## 2016-10-26 MED ORDER — MEPERIDINE HCL 100 MG/ML IJ SOLN
INTRAMUSCULAR | Status: AC
  Filled 2016-10-26: qty 2

## 2016-10-26 MED ORDER — METOCLOPRAMIDE HCL 5 MG/ML IJ SOLN
5.0000 mg | Freq: Three times a day (TID) | INTRAMUSCULAR | Status: DC
  Administered 2016-10-26 – 2016-10-27 (×3): 5 mg via INTRAVENOUS
  Filled 2016-10-26 (×3): qty 2

## 2016-10-26 MED ORDER — MIDAZOLAM HCL 5 MG/5ML IJ SOLN
INTRAMUSCULAR | Status: DC | PRN
Start: 2016-10-26 — End: 2016-10-26
  Administered 2016-10-26 (×2): 2 mg via INTRAVENOUS

## 2016-10-26 MED ORDER — MIDAZOLAM HCL 5 MG/5ML IJ SOLN
INTRAMUSCULAR | Status: AC
  Filled 2016-10-26: qty 10

## 2016-10-26 MED ORDER — LIDOCAINE VISCOUS 2 % MT SOLN
OROMUCOSAL | Status: AC
  Filled 2016-10-26: qty 15

## 2016-10-26 NOTE — Care Management Note (Signed)
Case Management Note  Patient Details  Name: [name] MRN: [id number] Date of Birth: [birth year]    Expected Discharge Date:    10/26/2016              Expected Discharge Plan:  Home/Self Care  In-House Referral:  NA  Discharge planning Services  CM Consult  Post Acute Care Choice:  NA Choice offered to:  NA  DME Arranged:    DME Agency:     HH Arranged:    [place] Agency:     Status of Service:  Completed, signed off  If discussed at [employer] of Stay Meetings, dates discussed:    Additional Comments: Chart reviewed for CM needs. Patient lives with son, ind with ADL's. No needs identified.  Raequan Vanschaick, Chauncey Reading, RN 10/26/2016, 2:04 PM

## 2016-10-26 NOTE — Op Note (Signed)
[facility] Patient Name: Shawna Huerta Procedure Date: 10/26/2016 1:09 PM MRN: [id number] Date of Birth: 1960-12-02 Attending MD: Norvel Richards , MD CSN: [id number] Age: 55 Admit Type: Inpatient Procedure:                Upper GI endoscopy - diagnostic Indications:              Nausea with vomiting Providers:                Norvel Richards, MD, Janeece Riggers, RN, Randa Spike, Technician Referring MD:              Medicines:                Midazolam 4 mg IV, Meperidine 100 mg IV,                            Ondansetron 4 mg IV Complications:            No immediate complications. Estimated Blood Loss:     Estimated blood loss: none. Procedure:                Pre-Anesthesia Assessment:                           - Prior to the procedure, a History and Physical                            was performed, and patient medications and                            allergies were reviewed. The patient's tolerance of                            previous anesthesia was also reviewed. The risks                            and benefits of the procedure and the sedation                            options and risks were discussed with the patient.                            All questions were answered, and informed consent                            was obtained. Prior Anticoagulants: The patient has                            taken no previous anticoagulant or antiplatelet                            agents. ASA Grade Assessment: II - A patient with  mild systemic disease. After reviewing the risks                            and benefits, the patient was deemed in                            satisfactory condition to undergo the procedure.                           After obtaining informed consent, the endoscope was                            passed under direct vision. Throughout the                            procedure, the patient's  blood pressure, pulse, and                            oxygen saturations were monitored continuously. The                            [id number]) was introduced through the                            mouth, and advanced to the second part of duodenum.                            The upper GI endoscopy was accomplished without                            difficulty. The patient tolerated the procedure                            well. Scope In: 1:30:51 PM Scope Out: 1:34:47 PM Total Procedure Duration: 0 hours 3 minutes 56 seconds  Findings:      Esophagitis was found 35 to 37 cm from the incisors. localized area of       excoriation. Likely secondary to trauma from nausea and vomiting.      The entire examined stomach was normal. Pylorus patent.      The duodenal bulb and second portion of the duodenum were normal. Impression:               - Esophagitis - Likely related to recurrent nausea                            and vomiting. 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                           - Normal stomach.                           - Normal duodenal bulb and second portion  of the                            duodenum.                           - No specimens collected. Moderate Sedation:      Moderate (conscious) sedation was administered by the endoscopy nurse       and supervised by the endoscopist. The following parameters were       monitored: oxygen saturation, heart rate, blood pressure, respiratory       rate, EKG, adequacy of pulmonary ventilation, and response to care.       Total physician intraservice time was 17 minutes. Recommendation:           - Return patient to hospital ward for ongoing care.                            Continue PPI therapy. Reglan 5 mg IV before meals                            and at bedtime -8 doses. I reviewed the risks and                            benefits with this line of therapy with the patient                             prior to sedation today. She is agreeable.                           - Low fiber / gastroparesis diet.                           - Continue present medications. Procedure Code(s):        --- Professional ---                           [phone], Esophagogastroduodenoscopy, flexible,                            transoral; diagnostic, including collection of                            specimen(s) by brushing or washing, when performed                            (separate procedure)                           99152, Moderate sedation services provided by the                            same physician or other qualified health care                            professional performing the diagnostic or  therapeutic service that the sedation supports,                            requiring the presence of an independent trained                            observer to assist in the monitoring of the                            patient's level of consciousness and physiological                            status; initial 15 minutes of intraservice time,                            patient age 88 years or older Diagnosis Code(s):        --- Professional ---                           K20.9, Esophagitis, unspecified                           R11.2, Nausea with vomiting, unspecified CPT copyright 2016 [employer] Medical Association. All rights reserved. The codes documented in this report are preliminary and upon coder review may  be revised to meet current compliance requirements. Cristopher Estimable. Edris Schneck, MD Norvel Richards, MD 10/26/2016 1:48:32 PM This report has been signed electronically. Number of Addenda: 0

## 2016-10-27 ENCOUNTER — Telehealth: Payer: Self-pay | Admitting: Gastroenterology

## 2016-10-27 ENCOUNTER — Encounter: Payer: Self-pay | Admitting: Gastroenterology

## 2016-10-27 DIAGNOSIS — I10 Essential (primary) hypertension: Secondary | ICD-10-CM

## 2016-10-27 DIAGNOSIS — N179 Acute kidney failure, unspecified: Secondary | ICD-10-CM

## 2016-10-27 DIAGNOSIS — K3184 Gastroparesis: Secondary | ICD-10-CM

## 2016-10-27 DIAGNOSIS — K3189 Other diseases of stomach and duodenum: Secondary | ICD-10-CM

## 2016-10-27 DIAGNOSIS — E871 Hypo-osmolality and hyponatremia: Secondary | ICD-10-CM

## 2016-10-27 LAB — GLUCOSE, CAPILLARY
GLUCOSE-CAPILLARY: 94 mg/dL (ref 65–99)
GLUCOSE-CAPILLARY: 97 mg/dL (ref 65–99)

## 2016-10-27 MED ORDER — PANTOPRAZOLE SODIUM 40 MG PO TBEC: 40.0000 mg | DELAYED_RELEASE_TABLET | Freq: Two times a day (BID) | ORAL | 2 refills | Status: DC

## 2016-10-27 MED ORDER — METOCLOPRAMIDE HCL 5 MG PO TABS: 5.0000 mg | ORAL_TABLET | Freq: Three times a day (TID) | ORAL | 1 refills | Status: DC

## 2016-10-27 NOTE — Telephone Encounter (Signed)
Requested records.

## 2016-10-27 NOTE — Progress Notes (Signed)
Patient with orders to be discharge home. Discharge instructions given, patient verbalized understanding. Prescriptions given. Patient stable. Patient left in private vehicle.  

## 2016-10-27 NOTE — Progress Notes (Signed)
Subjective:  Feels better this morning. Able to get down mashed potatoes and some baked chicken last night.   Objective: Vital signs in last 24 hours: Temp:  [97.9 F (36.6 C)-98.8 F (37.1 C)] 98.7 F (37.1 C) (11/30 0555) Pulse Rate:  [53-93] 66 (11/30 0555) Resp:  [9-21] 16 (11/30 0555) BP: (121-137)/(55-89) 122/65 (11/30 0555) SpO2:  [97 %-100 %] 100 % (11/30 0555) Weight:  [247 lb (112 kg)-247 lb 12.8 oz (112.4 kg)] 247 lb 12.8 oz (112.4 kg) (11/30 0555) Last BM Date: 10/26/16 General:   Alert,  Well-developed, well-nourished, pleasant and cooperative in NAD Head:  Normocephalic and atraumatic. Eyes:  Sclera clear, no icterus.  Abdomen:  Soft, mild diffuse tenderness and nondistended.  Normal bowel sounds, without guarding, and without rebound.   Extremities:  Without clubbing, deformity or edema. Neurologic:  Alert and  oriented x4;  grossly normal neurologically. Skin:  Intact without significant lesions or rashes. Psych:  Alert and cooperative. Normal mood and affect.  Intake/Output from previous day: 11/29 0701 - 11/30 0700 In: 2321 [P.O.:240; I.V.:2081] Out: 950 [Urine:950] Intake/Output this shift: No intake/output data recorded.  Lab Results: CBC  Recent Labs  10/25/16 0436  WBC 12.1*  HGB 11.4*  HCT 35.4*  MCV 68.2*  PLT 199   Lab Results  Component Value Date   IRON 96 10/25/2016   TIBC 210 (L) 10/25/2016   FERRITIN 163 10/25/2016   Lab Results  Component Value Date   FOLATE 7.0 10/25/2016   Lab Results  Component Value Date   VITAMINB12 215 10/25/2016     BMET  Recent Labs  10/25/16 0436  NA 133*  K 3.7  CL 104  CO2 23  GLUCOSE 109*  BUN 11  CREATININE 0.73  CALCIUM 8.3*   LFTs Lab Results  Component Value Date   ALT 10 (L) 10/24/2016   AST 13 (L) 10/24/2016   ALKPHOS 45 10/24/2016   BILITOT 0.8 10/24/2016   Lab Results  Component Value Date   LIPASE 59 (H) 10/23/2016     PT/INR No results for input(s): LABPROT,  INR in the last 72 hours.    Imaging Studies: Ct Abdomen Pelvis Wo Contrast  Result Date: 10/23/2016 CLINICAL DATA:  History of diabetes and hypertension presents with nausea and nonbloody emesis and diarrhea and lower abdominal pain EXAM: CT ABDOMEN AND PELVIS WITHOUT CONTRAST TECHNIQUE: Multidetector CT imaging of the abdomen and pelvis was performed following the standard protocol without IV contrast. COMPARISON:  10/10/2015 FINDINGS: Lower chest: No acute consolidation or pleural effusion. Heart size normal. Hepatobiliary: No focal hepatic abnormality. Surgical clips in the gallbladder fossa consistent with prior cholecystectomy. No biliary dilatation. Pancreas: Unremarkable. No pancreatic ductal dilatation or surrounding inflammatory changes. Spleen: Normal in size without focal abnormality. Adrenals/Urinary Tract: Adrenal glands are unremarkable. Kidneys are normal, without renal calculi, focal lesion, or hydronephrosis. Bladder is unremarkable. Stomach/Bowel: Stomach is within normal limits. Appendix appears normal. No evidence of bowel wall thickening, distention, or inflammatory changes. Mild sigmoid diverticular disease but no evidence for wall thickening or acute inflammation. Vascular/Lymphatic: No significant vascular findings are present. No enlarged abdominal or pelvic lymph nodes. Reproductive: Status post hysterectomy. No adnexal masses. Other: Surgical clips in the right hemipelvis. No free air or free fluid. Musculoskeletal: No acute or significant osseous findings. IMPRESSION: No CT evidence for acute intra-abdominal or pelvic pathology. Sigmoid diverticulosis without evidence for diverticulitis Electronically Signed   By: Donavan Foil M.D.   On: 10/23/2016 19:25  [2 weeks]  Assessment: 55 year old female admitted with acute onset of abdominal pain, N/V, diarrhea, clinically improved with cessation of diarrhea but persistent nausea and vomiting. Unable to advance diet, with  persistent nausea and vomiting after eating. Elevated lipase several days prior to admission with non-contrast CT without signs of pancreatitis; as of note, last Nov 2016 was hospitalized for pancreatitis, unknown etiology. Admitting lipase close to normal.   EGD yesterday showed esophagitis likely related to recurrent N/V. 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 Improved today with supportive measures, ie Reglan.  Diarrhea: resolved.   Chronic microcytic anemia: without GI bleeding, colonoscopy up-to-date as of last year by Dr. Britta Mccreedy with multiple polyps per patient; I do not have reports at time of consultation. Snemia panel with no evidence of IDA.   Plan: 1. Short term Reglan around the clock, ie for 2-3 days.  2. Continue Zofran scheduled for few days, then as needed.  3. Continue PPI BID for one month, then daily as needed. 4. F/U OV for idiopathic pancreatitis with RGA (patient wants to transfer care from Dr. Britta Mccreedy) 09/2015 and vomiting. 5. Will follow peripherally, hopefully discharge later today or tomorrow morning.    Laureen Ochs. Bernarda Caffey [facility] [phone] 11/30/201710:07 AM      LOS: 2 days

## 2016-10-27 NOTE — Discharge Summary (Signed)
Physician Discharge Summary  Shawna Huerta [id number] DOB: Mar 30, 1961 DOA: 10/23/2016  PCP: Glenda Chroman, MD  Admit date: 10/23/2016 Discharge date: 10/27/2016  Time spent: 45 minutes  Recommendations for Outpatient Follow-up:  -Will be discharged home today. -Advised to follow up with PCP in 2 weeks.   Discharge Diagnoses:  Principal Problem:   Acute gastroenteritis Active Problems:   Hyponatremia   Essential hypertension   Tobacco abuse   IBS (irritable bowel syndrome)   Type 2 diabetes mellitus (HCC)   AKI (acute kidney injury) ([place])   Postviral gastroparesis   Discharge Condition: Stable and improved  Filed Weights   10/26/16 0500 10/26/16 1258 10/27/16 0555  Weight: 112.1 kg (247 lb 2.2 oz) 112 kg (247 lb) 112.4 kg (247 lb 12.8 oz)    History of present illness:  As per Dr. Olevia Bowens on 11/26: Shawna Huerta is a 55 y.o. female with medical history significant of anemia, arthritis, Bartholin's cyst excision, colitis, hypertension, tobacco use disorder, previous acute pancreatitis episode in 09/2015 who presents to the emergency department for the second time since Wednesday due to above complaints.  Per patient, since Wednesday morning after she ate some yogurt, she has been having nausea with over 10 episodes of emesis daily, associated with multiple episodes of diarrhea and diffuse abdominal pain. She has been unable to drink or eat without treated and vomiting. She denies fever, chills, but feels frequently lightheaded, very tired and fatigued. She denies dyspnea, productive cough, chest pain, diaphoresis, PND, orthopnea or pitting edema of the lower extremities. She denies sick contacts or travel history.   ED Course: The patient received a 2000 mL normal saline bolus. Also hydromorphone 1, Zofran 4 and famotidine 20 mg IVP. She is states that her pain has decreased from 9 to about a 4 out of 10 now.   Her workup shows a white count of 17.9, hemoglobin 16.1 g/dL,  which are higher than the patient's baseline likely due to hemoconcentration. CT scan of the abdomen/pelvis without contrast shows sigmoid diverticulosis without evidence for diverticulitis and did not show any acute intra-abdominal or pelvic pathology.  Hospital Course:   Esophagitis -Seen on EGD. -GI believes likely due to n/v and is recommending BID PPI and reglan. -Tolerating solid diet without issues.  Hyponatremia -Due to GI losses. Resolved with IVF.  ARF -Resolved. -Likely due to GI losseswith profuse n/v.  HTN -Well controlled, continue home meds.  DM II -Well controlled.  Procedures: EGD: Impression:               - Esophagitis - Likely related to recurrent nausea                            and vomiting. 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                           - Normal stomach.                           - Normal duodenal bulb and second portion of the  duodenum.                            - No specimens collected.  Consultations:  GI  Discharge Instructions  Discharge Instructions    Diet - low sodium heart healthy    Complete by:  As directed    Increase activity slowly    Complete by:  As directed        Medication List    STOP taking these medications   ibuprofen 600 MG tablet Commonly known as:  ADVIL,MOTRIN   ondansetron 4 mg Tabs tablet Commonly known as:  ZOFRAN     TAKE these medications   dicyclomine 20 MG tablet Commonly known as:  BENTYL Take 1 tablet (20 mg total) by mouth 2 (two) times daily.   ferrous sulfate 325 (65 FE) MG EC tablet Take 325 mg by mouth daily with breakfast.   glyBURIDE-metformin 5-500 MG tablet Commonly known as:  GLUCOVANCE Take 1 tablet by mouth 2 (two) times daily.   lisinopril 10 MG tablet Commonly known as:  PRINIVIL,ZESTRIL Take 10 mg by mouth daily.   metoCLOPramide 5 MG tablet Commonly known as:  REGLAN Take 1 tablet (5  mg total) by mouth 4 (four) times daily -  before meals and at bedtime.   ondansetron 4 MG disintegrating tablet Commonly known as:  ZOFRAN ODT Take 1 tablet (4 mg total) by mouth every 8 (eight) hours as needed for nausea or vomiting.   pantoprazole 40 MG tablet Commonly known as:  PROTONIX Take 1 tablet (40 mg total) by mouth 2 (two) times daily before a meal.      Allergies  Allergen Reactions  . Iohexol Swelling     Code: HIVES, Desc: PT STATES THE EYES SWOLL AND ITCHED, NEEDS PRE MEDS    Follow-up Information    Glenda Chroman, MD. Schedule an appointment as soon as possible for a visit in 2 week(s).   Specialty:  Internal Medicine Contact information: [address] [address] [phone]            The results of significant diagnostics from this hospitalization (including imaging, microbiology, ancillary and laboratory) are listed below for reference.    Significant Diagnostic Studies: Ct Abdomen Pelvis Wo Contrast  Result Date: 10/23/2016 CLINICAL DATA:  History of diabetes and hypertension presents with nausea and nonbloody emesis and diarrhea and lower abdominal pain EXAM: CT ABDOMEN AND PELVIS WITHOUT CONTRAST TECHNIQUE: Multidetector CT imaging of the abdomen and pelvis was performed following the standard protocol without IV contrast. COMPARISON:  10/10/2015 FINDINGS: Lower chest: No acute consolidation or pleural effusion. Heart size normal. Hepatobiliary: No focal hepatic abnormality. Surgical clips in the gallbladder fossa consistent with prior cholecystectomy. No biliary dilatation. Pancreas: Unremarkable. No pancreatic ductal dilatation or surrounding inflammatory changes. Spleen: Normal in size without focal abnormality. Adrenals/Urinary Tract: Adrenal glands are unremarkable. Kidneys are normal, without renal calculi, focal lesion, or hydronephrosis. Bladder is unremarkable. Stomach/Bowel: Stomach is within normal limits. Appendix appears normal. No  evidence of bowel wall thickening, distention, or inflammatory changes. Mild sigmoid diverticular disease but no evidence for wall thickening or acute inflammation. Vascular/Lymphatic: No significant vascular findings are present. No enlarged abdominal or pelvic lymph nodes. Reproductive: Status post hysterectomy. No adnexal masses. Other: Surgical clips in the right hemipelvis. No free air or free fluid. Musculoskeletal: No acute or significant osseous findings. IMPRESSION: No CT evidence for acute intra-abdominal or pelvic pathology. Sigmoid  diverticulosis without evidence for diverticulitis Electronically Signed   By: Donavan Foil M.D.   On: 10/23/2016 19:25    Microbiology: No results found for this or any previous visit (from the past 240 hour(s)).   Labs: Basic Metabolic Panel:  Recent Labs Lab 10/23/16 1839 10/24/16 0451 10/25/16 0436  NA 127* 133* 133*  K 3.0* 4.1 3.7  CL 91* 103 104  CO2 21* 22 23  GLUCOSE 228* 161* 109*  BUN 29* 25* 11  CREATININE 1.80* 1.15* 0.73  CALCIUM 10.1 8.5* 8.3*  MG 2.0  --   --    Liver Function Tests:  Recent Labs Lab 10/23/16 1839 10/24/16 0451  AST 18 13*  ALT 14 10*  ALKPHOS 61 45  BILITOT 0.7 0.8  PROT 9.9* 7.1  ALBUMIN 4.5 3.3*    Recent Labs Lab 10/23/16 1839  LIPASE 59*   No results for input(s): AMMONIA in the last 168 hours. CBC:  Recent Labs Lab 10/23/16 1839 10/24/16 0451 10/25/16 0436  WBC 17.9* 14.1* 12.1*  NEUTROABS 10.9* 7.9* 5.9  HGB 16.1* 12.9 11.4*  HCT 46.6* 38.6 35.4*  MCV 65.9* 66.7* 68.2*  PLT 267 212 199   Cardiac Enzymes: No results for input(s): CKTOTAL, CKMB, CKMBINDEX, TROPONINI in the last 168 hours. BNP: BNP (last 3 results) No results for input(s): BNP in the last 8760 hours.  ProBNP (last 3 results) No results for input(s): PROBNP in the last 8760 hours.  CBG:  Recent Labs Lab 10/26/16 1236 10/26/16 1602 10/26/16 2136 10/27/16 0758 10/27/16 1134  GLUCAP 101* 84 122* 94 97         Signed:  HERNANDEZ ACOSTA,ESTELA  [facility] Pager: [phone] 10/27/2016, 12:20 PM

## 2016-10-27 NOTE — Telephone Encounter (Signed)
APPT MADE AND LETTER SENT  °

## 2016-10-27 NOTE — Telephone Encounter (Signed)
OV with [facility], E30 in two months. Hospital follow up of nausea/idiopathic pancreatitis.   We need to obtain copy of colonoscopy and path from Dr. Britta Mccreedy. Thanks!

## 2016-10-28 ENCOUNTER — Encounter (HOSPITAL_COMMUNITY): Payer: Self-pay | Admitting: Internal Medicine

## 2016-12-14 ENCOUNTER — Telehealth: Payer: Self-pay | Admitting: Gastroenterology

## 2016-12-14 ENCOUNTER — Encounter: Payer: Self-pay | Admitting: Gastroenterology

## 2016-12-14 NOTE — Telephone Encounter (Signed)
Reviewed tcs with path from Dr. Britta Mccreedy done 10/2015 At least 8 polyps removed, numerous tubular adenomas.   He recommended tcs in 1 year.  Patient has upcoming ov with [facility], can be discussed at that time.  Please make patient aware.

## 2016-12-19 NOTE — Telephone Encounter (Signed)
LMOM to call.

## 2016-12-20 NOTE — Telephone Encounter (Signed)
LMOM to call.

## 2016-12-20 NOTE — Telephone Encounter (Signed)
Pt is aware.  

## 2016-12-27 ENCOUNTER — Encounter: Payer: Self-pay | Admitting: Gastroenterology

## 2016-12-28 ENCOUNTER — Encounter: Payer: Self-pay | Admitting: Gastroenterology

## 2016-12-28 ENCOUNTER — Ambulatory Visit (INDEPENDENT_AMBULATORY_CARE_PROVIDER_SITE_OTHER): Payer: Managed Care, Other (non HMO) | Admitting: Gastroenterology

## 2016-12-28 DIAGNOSIS — K3189 Other diseases of stomach and duodenum: Secondary | ICD-10-CM | POA: Diagnosis not present

## 2016-12-28 DIAGNOSIS — D649 Anemia, unspecified: Secondary | ICD-10-CM

## 2016-12-28 DIAGNOSIS — D126 Benign neoplasm of colon, unspecified: Secondary | ICD-10-CM

## 2016-12-28 DIAGNOSIS — K580 Irritable bowel syndrome with diarrhea: Secondary | ICD-10-CM | POA: Diagnosis not present

## 2016-12-28 DIAGNOSIS — K3184 Gastroparesis: Secondary | ICD-10-CM

## 2016-12-28 NOTE — Progress Notes (Signed)
Subjective:    Patient ID: Shawna Huerta, female    DOB: 09-11-1961, 56 y.o.   MRN: [id number] Glenda Chroman, MD  HPI BEEN ON METFORMIN FOR 12 YEARS. INTERMITTENT LOOSE STOOLS.: 1-2X/WEEK AND WATERY STOOLS: 1 Q2-3 WEEKS. APPETITE: GOOD. WEIGHT 243 LBS-WAS 273 LBS AND BMI 42 (2015). STAYS COLD. BMs: EVERY DAY-NO #4 IN A LONG TIME. NAUSEA: NOT ALL THE TIME: 2X/ WEEK(SX LAST A DAY). VOMITED: LAST TIME-3X IN ONE DAY(NO BLOOD)HAD HEARTBURN LAST WEEK BUT NOT RECURRING THING. MAY HAVE SUPRAPUBIC ABDOMINAL PAIN-SOME DAYS WORSE THAN OTHER(NAGGING, AFTER BM DOES NOT CHANGE). NO BURNING WITH URINE(DM FOR PAST 16 YRS). MAY FEEL LIKE SHE DOESN'T EMPTY HER BLADDER 1-2X/WEEK. Asked about my daughter.  PT DENIES FEVER, CHILLS, HEMATOCHEZIA, HEMATEMESIS, melena, diarrhea, CHEST PAIN, SHORTNESS OF BREATH, CHANGE IN BOWEL IN HABITS, constipation, abdominal pain, problems swallowing, OR problems with sedation.   Past Medical History:  Diagnosis Date  . Anemia   . Arthritis   . Bartholin cyst 07/03/2014  . Colitis 08/2015   Treated at [facility]  . Diabetes mellitus   . Hypertension   . Labial abscess 08/2015   Status post I&D by Dr. Ladona Horns  . Nicotine addiction 09/03/2013  . Obesity   . Pancreatitis 09/2015   unknown etiology, no further work-up with gastroenterology   . Vaginal itching 09/03/2013   Past Surgical History:  Procedure Laterality Date  . ABDOMINAL HYSTERECTOMY    . ABDOMINAL SURGERY     exploratry with TAH  . BALLOON DILATION N/A 10/26/2016     . BARTHOLIN GLAND CYST EXCISION Right 08/20/2014     . CHOLECYSTECTOMY    . COLONOSCOPY  10/2015   Doristine Mango: mild diverticulosis, at least 8 polyps removed, multiple tubular adenomas. next TCS in 1 year  . ESOPHAGOGASTRODUODENOSCOPY N/A 10/26/2016     . FOOT SURGERY    . FOOT SURGERY Right 2011   shave bone  . TONSILLECTOMY     Allergies  Allergen Reactions  . Iohexol Swelling     Code: HIVES, Desc: PT STATES THE EYES  SWOLL AND ITCHED, NEEDS PRE MEDS    Current Outpatient Prescriptions  Medication Sig Dispense Refill  . ferrous sulfate 325 (65 FE) MG EC tablet Take 325 mg by mouth daily with breakfast.    . glyBURIDE-metformin (GLUCOVANCE) 5-500 MG per tablet Take 1 tablet by mouth 2 (two) times daily.    Marland Kitchen lisinopril (PRINIVIL,ZESTRIL) 10 MG tablet Take 10 mg by mouth daily.    . ondansetron (ZOFRAN ODT) 4 MG disintegrating tablet Take 1 tablet (4 mg total) by mouth every 8 (eight) hours as needed for nausea or vomiting.    .      .      .       Review of Systems PER HPI OTHERWISE ALL SYSTEMS ARE NEGATIVE.    Objective:   Physical Exam  Constitutional: She is oriented to person, place, and time. She appears well-developed and well-nourished. No distress.  HENT:  Head: Normocephalic and atraumatic.  Mouth/Throat: Oropharynx is clear and moist. No oropharyngeal exudate.  Eyes: Pupils are equal, round, and reactive to light. No scleral icterus.  Neck: Normal range of motion. Neck supple.  Cardiovascular: Normal rate, regular rhythm and normal heart sounds.   Pulmonary/Chest: Effort normal and breath sounds normal. No respiratory distress.  Abdominal: Soft. Bowel sounds are normal. She exhibits no distension. There is no tenderness.  Musculoskeletal: She exhibits no edema.  Lymphadenopathy:  She has no cervical adenopathy.  Neurological: She is alert and oriented to person, place, and time.  NO  NEW FOCAL DEFICITS  Psychiatric: She has a normal mood and affect.  Vitals reviewed.     Assessment & Plan:

## 2016-12-28 NOTE — Patient Instructions (Addendum)
IN December 2016 YOU HAD MORE THAN 3 SIMPLE ADENOMAS REMOVED BUT NONE OF THEM HAD ADVANCED CHANGES. NEXT COLONOSCOPY IN DEC 2019.  YOUR SISTERS, BROTHERS, CHILDREN, AND PARENTS NEED TO HAVE A COLONOSCOPY STARTING AT THE AGE OF 40.   STOP TAKING IRON. I WILL CHECK YOUR BLOOD COUNT AND IRON STORES IN 3 MOS.   CONTINUE YOUR WEIGHT LOSS EFFORTS.  WHILE I DO NOT WANT TO ALARM YOU, YOUR BODY MASS INDEX IS OVER 30 WHICH MEANS YOU ARE OBESE. OBESITY IS ASSOCIATED WITH AN INCREASE RISK FOR ALL CANCERS, INCLUDING ESOPHAGEAL AND COLON CANCER.   DRINK WATER TO KEEP YOUR URINE LIGHT YELLOW.  FOLLOW A HIGH FIBER DIET. AVOID ITEMS THAT CAUSE BLOATING & GAS. SEE INFO BELOW.  FOLLOW UP IN 6 MOS.   High-Fiber Diet A high-fiber diet changes your normal diet to include more whole grains, legumes, fruits, and vegetables. Changes in the diet involve replacing refined carbohydrates with unrefined foods. The calorie level of the diet is essentially unchanged. The Dietary Reference Intake (recommended amount) for adult males is 38 grams per day. For adult females, it is 25 grams per day. Pregnant and lactating women should consume 28 grams of fiber per day.Fiber is the intact part of a plant that is not broken down during digestion. Functional fiber is fiber that has been isolated from the plant to provide a beneficial effect in the body.  PURPOSE  Increase stool bulk.   Ease and regulate bowel movements.   Lower cholesterol.   REDUCE RISK OF COLON CANCER  INDICATIONS THAT YOU NEED MORE FIBER  Constipation and hemorrhoids.   Uncomplicated diverticulosis (intestine condition) and irritable bowel syndrome.   Weight management.   As a protective measure against hardening of the arteries (atherosclerosis), diabetes, and cancer.   GUIDELINES FOR INCREASING FIBER IN THE DIET  Start adding fiber to the diet slowly. A gradual increase of about 5 more grams (2 slices of whole-wheat bread, 2 servings of most  fruits or vegetables, or 1 bowl of high-fiber cereal) per day is best. Too rapid an increase in fiber may result in constipation, flatulence, and bloating.   Drink enough water and fluids to keep your urine clear or pale yellow. Water, juice, or caffeine-free drinks are recommended. Not drinking enough fluid may cause constipation.   Eat a variety of high-fiber foods rather than one type of fiber.   Try to increase your intake of fiber through using high-fiber foods rather than fiber pills or supplements that contain small amounts of fiber.   The goal is to change the types of food eaten. Do not supplement your present diet with high-fiber foods, but replace foods in your present diet.   INCLUDE A VARIETY OF FIBER SOURCES  Replace refined and processed grains with whole grains, canned fruits with fresh fruits, and incorporate other fiber sources. White rice, white breads, and most bakery goods contain little or no fiber.   Brown whole-grain rice, buckwheat oats, and many fruits and vegetables are all good sources of fiber. These include: broccoli, Brussels sprouts, cabbage, cauliflower, beets, sweet potatoes, white potatoes (skin on), carrots, tomatoes, eggplant, squash, berries, fresh fruits, and dried fruits.   Cereals appear to be the richest source of fiber. Cereal fiber is found in whole grains and bran. Bran is the fiber-rich outer coat of cereal grain, which is largely removed in refining. In whole-grain cereals, the bran remains. In breakfast cereals, the largest amount of fiber is found in those with "bran" in  their names. The fiber content is sometimes indicated on the label.   You may need to include additional fruits and vegetables each day.   In baking, for 1 cup white flour, you may use the following substitutions:   1 cup whole-wheat flour minus 2 tablespoons.   1/2 cup white flour plus 1/2 cup whole-wheat flour.

## 2016-12-28 NOTE — Assessment & Plan Note (Signed)
SYMPTOMS FAIRLY WELL CONTROLLED AND MAY BE EXACERBATED BY REFLUX.Marland Kitchen  CONTINUE TO MONITOR SYMPTOMS. IN December 2016 YOU HAD MORE THAN 3 SIMPLE ADENOMAS REMOVED BUT NONE OF THEM HAD ADVANCED CHANGES. NEXT COLONOSCOPY IN DEC 2019.  YOUR SISTERS, BROTHERS, CHILDREN, AND PARENTS NEED TO HAVE A COLONOSCOPY STARTING AT THE AGE OF 40.   STOP TAKING IRON. I WILL CHECK YOUR BLOOD COUNT AND IRON STORES IN 3 MOS.  CONTINUE WEIGHT LOSS EFFORTS.   DRINK WATER TO KEEP YOUR URINE LIGHT YELLOW. FOLLOW A HIGH FIBER DIET. AVOID ITEMS THAT CAUSE BLOATING & GAS.  FOLLOW UP IN 6 MOS.

## 2016-12-28 NOTE — Assessment & Plan Note (Signed)
SYMPTOMS FAIRLY WELL CONTROLLED AND HAS INTERMITTENT DIARRHEA/LOOSE STOOLS. SYMPTOMS MAY BE EXACERBATED BY MEDS, DIETARY CHOICES OR DIABETIC COLOPATHY/ENTEROPATHY.   CONTINUE TO MONITOR SYMPTOMS.  DRINK WATER TO KEEP YOUR URINE LIGHT YELLOW. FOLLOW A HIGH FIBER DIET. AVOID ITEMS THAT CAUSE BLOATING & GAS.  HANDOUT GIVEN.  FOLLOW UP IN 6 MOS.

## 2016-12-28 NOTE — Assessment & Plan Note (Signed)
LAST CBC NOV 2017 NL. NO BRBPR OR MELENA.  STOP IRON CBC/FERRITIN IN 3 MOS

## 2016-12-28 NOTE — Assessment & Plan Note (Signed)
>   3 REMOVED DEC 2016-NO ADVANCED CHANGES.  IN December 2016 YOU HAD MORE THAN 3 SIMPLE ADENOMAS REMOVED BUT NONE OF THEM HAD ADVANCED CHANGES. NEXT COLONOSCOPY IN DEC 2019.  YOUR SISTERS, BROTHERS, CHILDREN, AND PARENTS NEED TO HAVE A COLONOSCOPY STARTING AT THE AGE OF 40.   STOP TAKING IRON. I WILL CHECK YOUR BLOOD COUNT AND IRON STORES IN 3 MOS.   CONTINUE YOUR WEIGHT LOSS EFFORTS. A  BODY MASS INDEX IS OVER 30 IS ASSOCIATED WITH AN INCREASE RISK FOR ALL CANCERS, INCLUDING ESOPHAGEAL AND COLON CANCER. DRINK WATER TO KEEP YOUR URINE LIGHT YELLOW. FOLLOW A HIGH FIBER DIET. AVOID ITEMS THAT CAUSE BLOATING & GAS.  FOLLOW UP IN 6 MOS.

## 2016-12-29 NOTE — Progress Notes (Signed)
ON RECALL  °

## 2016-12-29 NOTE — Progress Notes (Signed)
cc'ed to pcp °

## 2017-05-01 ENCOUNTER — Encounter: Payer: Self-pay | Admitting: Gastroenterology

## 2017-12-04 ENCOUNTER — Emergency Department (HOSPITAL_COMMUNITY): Payer: Managed Care, Other (non HMO)

## 2017-12-04 ENCOUNTER — Emergency Department (HOSPITAL_COMMUNITY)
Admission: EM | Admit: 2017-12-04 | Discharge: 2017-12-04 | Disposition: A | Discharge status: Home / Self Care | Payer: Managed Care, Other (non HMO) | Attending: Emergency Medicine | Admitting: Emergency Medicine

## 2017-12-04 ENCOUNTER — Encounter (HOSPITAL_COMMUNITY): Payer: Self-pay

## 2017-12-04 DIAGNOSIS — Z7984 Long term (current) use of oral hypoglycemic drugs: Secondary | ICD-10-CM | POA: Diagnosis not present

## 2017-12-04 DIAGNOSIS — M25562 Pain in left knee: Secondary | ICD-10-CM

## 2017-12-04 DIAGNOSIS — M1712 Unilateral primary osteoarthritis, left knee: Secondary | ICD-10-CM

## 2017-12-04 DIAGNOSIS — Z79899 Other long term (current) drug therapy: Secondary | ICD-10-CM | POA: Insufficient documentation

## 2017-12-04 DIAGNOSIS — I10 Essential (primary) hypertension: Secondary | ICD-10-CM | POA: Diagnosis not present

## 2017-12-04 DIAGNOSIS — E119 Type 2 diabetes mellitus without complications: Secondary | ICD-10-CM | POA: Insufficient documentation

## 2017-12-04 DIAGNOSIS — Z87891 Personal history of nicotine dependence: Secondary | ICD-10-CM | POA: Insufficient documentation

## 2017-12-04 MED ORDER — DEXAMETHASONE 4 MG PO TABS: 4.0000 mg | ORAL_TABLET | Freq: Two times a day (BID) | ORAL | 0 refills | Status: DC

## 2017-12-04 MED ORDER — HYDROCODONE-ACETAMINOPHEN 5-325 MG PO TABS: 1.0000 | ORAL_TABLET | ORAL | 0 refills | Status: DC | PRN

## 2017-12-04 NOTE — ED Triage Notes (Signed)
Pt reports left knee pain that started off as catch on Friday and has increasingly gotten worse. Noted to be swollen. No injury

## 2017-12-04 NOTE — Discharge Instructions (Signed)
Your x-ray is significant for advanced arthritis involving your left knee.  No fluid in the joint or effusion noted.  Your vital signs within normal limits.  Please use your knee sleeve and crutches when up and about.  Heating pad may be helpful at night.  Please see Dr. Aline Brochure for orthopedic management as soon as possible.  Use Decadron 2 times daily with food.  Use Tylenol extra strength every 4 hours as needed for pain. Use Norco for more severe pain. This medication may cause drowsiness. Please do not drink, drive, or participate in activity that requires concentration while taking this medication. It is important that you see Dr Aline Brochure or the orthopedic MD of your choice as soon as possible.

## 2017-12-04 NOTE — ED Provider Notes (Signed)
[facility] EMERGENCY DEPARTMENT Provider Note   CSN: [id number] Arrival date & time: 12/04/17  1028     History   Chief Complaint Chief Complaint  Patient presents with  . Knee Pain    HPI Shawna Huerta is a 57 y.o. female.   Knee Pain   This is a new problem. The current episode started more than 2 days ago. The problem occurs daily. The problem has been gradually worsening. The pain is present in the left knee. The quality of the pain is described as aching and sharp. The pain is moderate. Associated symptoms include limited range of motion. Pertinent negatives include no numbness. The symptoms are aggravated by standing. She has tried nothing for the symptoms.    Past Medical History:  Diagnosis Date  . Anemia   . Arthritis   . Bartholin cyst 07/03/2014  . Colitis 08/2015   Treated at [facility]  . Diabetes mellitus   . Hypertension   . Labial abscess 08/2015   Status post I&D by Dr. Ladona Horns  . Nicotine addiction 09/03/2013  . Obesity   . Pancreatitis 09/2015   unknown etiology, no further work-up with gastroenterology   . Vaginal itching 09/03/2013    Patient Active Problem List   Diagnosis Date Noted  . 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 Normocytic anemia 12/28/2016  . Postviral gastroparesis   . Type 2 diabetes mellitus ([place]) 10/23/2016  . AKI (acute kidney injury) ([place]) 10/23/2016  . IBS (irritable bowel syndrome) 10/12/2015  . Acute pancreatitis 10/10/2015  . Diarrhea 10/10/2015  . Non-intractable vomiting with nausea 10/10/2015  . Type 2 diabetes mellitus without complication ([place]) 69/62/9528  . Labial abscess 10/10/2015  . Essential hypertension 10/10/2015  . Tobacco abuse 10/10/2015  . Bartholin cyst 07/03/2014  . Diabetes ([place]) 09/03/2013  . Obesity 09/03/2013  . Vaginal itching 09/03/2013  . Nicotine addiction 09/03/2013  . 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 TENOSYNOVITIS OF FOOT AND ANKLE 09/22/2010  . FOOT PAIN, BILATERAL 09/22/2010      Past Surgical History:  Procedure Laterality Date  . ABDOMINAL HYSTERECTOMY    . ABDOMINAL SURGERY     exploratry with TAH  . BALLOON DILATION N/A 10/26/2016   Procedure: Pyloric channel BALLOON DILATION;  Surgeon: Daneil Dolin, MD;  Location: [facility] ENDO SUITE;  Service: Endoscopy;  Laterality: N/A;  . BARTHOLIN GLAND CYST EXCISION Right 08/20/2014   Procedure: EXCISION OF RIGHT BARTHOLIN'S GLAND CYST;  Surgeon: Florian Buff, MD;  Location: [facility] ORS;  Service: Gynecology;  Laterality: Right;  . CHOLECYSTECTOMY    . COLONOSCOPY  10/2015   Doristine Mango: mild diverticulosis, at least 8 polpys removed, multiple tubular adenomas. next tcs in 1 year  . ESOPHAGOGASTRODUODENOSCOPY N/A 10/26/2016   Procedure: ESOPHAGOGASTRODUODENOSCOPY (EGD);  Surgeon: Daneil Dolin, MD;  Location: [facility] END[facility] SUITE;  Service: Endoscopy;  Laterality: N/A;  . FOOT SURGERY    . FOOT SURGERY Right 2011   shave bone  . TONSILLECTOMY      OB History    Gravida Para Term Preterm AB Living   2 1 1   1 1    SAB TAB Ectopic Multiple Live Births   1               Home Medications    Prior to Admission medications   Medication Sig Start Date End Date Taking? Authorizing Provider  dicyclomine (BENTYL) 20 MG tablet Take 1 tablet (20 mg total) by mouth 2 (two) times daily. Patient not  taking: Reported on 12/28/2016 10/20/16   Kinnie Feil, PA-C  ferrous sulfate 325 (65 FE) MG EC tablet Take 325 mg by mouth daily with breakfast.    [provider]  glyBURIDE-metformin (GLUCOVANCE) 5-500 MG per tablet Take 1 tablet by mouth 2 (two) times daily.    [provider]  lisinopril (PRINIVIL,ZESTRIL) 10 MG tablet Take 10 mg by mouth daily. 03/12/15   [provider]  metoCLOPramide (REGLAN) 5 MG tablet Take 1 tablet (5 mg total) by mouth 4 (four) times daily -  before meals and at bedtime. Patient not taking: Reported on 12/28/2016 10/27/16   Isaac Bliss, Rayford Halsted, MD  ondansetron  (ZOFRAN ODT) 4 MG disintegrating tablet Take 1 tablet (4 mg total) by mouth every 8 (eight) hours as needed for nausea or vomiting. 10/20/16   Kinnie Feil, PA-C  pantoprazole (PROTONIX) 40 MG tablet Take 1 tablet (40 mg total) by mouth 2 (two) times daily before a meal. 10/27/16   Isaac Bliss, Rayford Halsted, MD    Family History Family History  Problem Relation Age of Onset  . Diabetes Mother   . Stroke Mother   . Cancer Father        liver and colon. Patient unsure primary   . Stomach cancer Father   . Diabetes Sister   . Diabetes Brother   . Lung cancer Brother     Social History Social History   Tobacco Use  . Smoking status: Former Smoker    Packs/day: 0.25    Years: 3.00    Pack years: 0.75    Types: Cigarettes    Last attempt to quit: 10/21/2016    Years since quitting: 1.1  . Smokeless tobacco: Never Used  Substance Use Topics  . Alcohol use: No    Comment: occ.  . Drug use: No     Allergies   Iohexol   Review of Systems Review of Systems  Constitutional: Negative for activity change.       All ROS Neg except as noted in HPI  HENT: Negative for nosebleeds.   Eyes: Negative for photophobia and discharge.  Respiratory: Negative for cough, shortness of breath and wheezing.   Cardiovascular: Negative for chest pain and palpitations.  Gastrointestinal: Negative for abdominal pain and blood in stool.  Genitourinary: Negative for dysuria, frequency and hematuria.  Musculoskeletal: Positive for arthralgias. Negative for back pain and neck pain.       Knee pain  Skin: Negative.   Neurological: Negative for dizziness, seizures, speech difficulty and numbness.  Psychiatric/Behavioral: Negative for confusion and hallucinations.     Physical Exam Updated Vital Signs BP 138/70 (BP Location: Right Arm)   Pulse 70   Temp 98.4 F (36.9 C) (Oral)   Resp 16   Ht 5\' 7"  (1.702 m)   Wt 122.5 kg (270 lb)   SpO2 98%   BMI 42.29 kg/m   Physical Exam    Constitutional: She is oriented to person, place, and time. She appears well-developed and well-nourished.  Non-toxic appearance.  HENT:  Head: Normocephalic.  Right Ear: Tympanic membrane and external ear normal.  Left Ear: Tympanic membrane and external ear normal.  Eyes: EOM and lids are normal. Pupils are equal, round, and reactive to light.  Neck: Normal range of motion. Neck supple. Carotid bruit is not present.  Cardiovascular: Normal rate, regular rhythm, normal heart sounds, intact distal pulses and normal pulses.  Pulmonary/Chest: Breath sounds normal. No respiratory distress.  Abdominal: Soft. Bowel  sounds are normal. There is no tenderness. There is no guarding.  Musculoskeletal:       Right shoulder: She exhibits normal range of motion, no tenderness and no pain.       Left knee: She exhibits decreased range of motion. She exhibits no effusion. Tenderness found. Lateral joint line tenderness noted. No patellar tendon tenderness noted.  Lymphadenopathy:       Head (right side): No submandibular adenopathy present.       Head (left side): No submandibular adenopathy present.    She has no cervical adenopathy.  Neurological: She is alert and oriented to person, place, and time. She has normal strength. No cranial nerve deficit or sensory deficit.  Skin: Skin is warm and dry.  Psychiatric: She has a normal mood and affect. Her speech is normal.  Nursing note and vitals reviewed.    ED Treatments / Results  Labs (all labs ordered are listed, but only abnormal results are displayed) Labs Reviewed - No data to display  EKG  EKG Interpretation None       Radiology Dg Knee Complete 4 Views Left  Result Date: 12/04/2017 CLINICAL DATA:  Left knee pain for 4 days.  No known injury. EXAM: LEFT KNEE - COMPLETE 4+ VIEW COMPARISON:  None. FINDINGS: There is no acute bony or joint abnormality. Mild medial compartment joint space narrowing is seen with small osteophytes about the  knee. Spurring off the superior and inferior poles of the patella is identified. No joint effusion. IMPRESSION: No acute abnormality. Mild osteoarthritis. Patellar spurring. Electronically Signed   By: Inge Rise M.D.   On: 12/04/2017 12:14    Procedures Procedures (including critical care time)  Medications Ordered in ED Medications - No data to display   Initial Impression / Assessment and Plan / ED Course  I have reviewed the triage vital signs and the nursing notes.  Pertinent labs & imaging results that were available during my care of the patient were reviewed by me and considered in my medical decision making (see chart for details).       Final Clinical Impressions(s) / ED Diagnoses MDM Patient complains of left foot knee pain is been going on for about 4 days.  No previous operations or procedures involving the left knee.  X-ray of the knee shows medial compartment joint space narrowing with small osteophytes about the knee.  There is also spurring off the superior and inferior poles of the patella.  No effusion appreciated.  Patient fitted with crutches and a knee sleeve.  I strongly encouraged the patient to see orthopedics as soon as possible for orthopedic evaluation and management.  Patient is in agreement with this plan.   Final diagnoses:  Acute pain of left knee  Primary osteoarthritis of left knee    ED Discharge Orders        Ordered    HYDROcodone-acetaminophen (NORCO/VICODIN) 5-325 MG tablet  Every 4 hours PRN     12/04/17 1630    dexamethasone (DECADRON) 4 MG tablet  2 times daily with meals     12/04/17 1630       Lily Kocher, PA-C 12/04/17 1645    Orlie Dakin, MD 12/05/17 [phone]

## 2018-09-06 ENCOUNTER — Encounter: Payer: Self-pay | Admitting: Gastroenterology

## 2019-07-05 ENCOUNTER — Inpatient Hospital Stay (HOSPITAL_COMMUNITY): Payer: BC Managed Care – PPO

## 2019-07-05 ENCOUNTER — Inpatient Hospital Stay (HOSPITAL_COMMUNITY)
Admission: EM | Admit: 2019-07-05 | Discharge: 2019-07-07 | DRG: 683 | Disposition: A | Discharge status: Home / Self Care | Payer: BC Managed Care – PPO | Attending: Family Medicine | Admitting: Family Medicine

## 2019-07-05 ENCOUNTER — Emergency Department (HOSPITAL_COMMUNITY): Payer: BC Managed Care – PPO

## 2019-07-05 ENCOUNTER — Encounter (HOSPITAL_COMMUNITY): Payer: Self-pay

## 2019-07-05 ENCOUNTER — Other Ambulatory Visit: Payer: Self-pay

## 2019-07-05 DIAGNOSIS — Z7984 Long term (current) use of oral hypoglycemic drugs: Secondary | ICD-10-CM | POA: Diagnosis not present

## 2019-07-05 DIAGNOSIS — E1129 Type 2 diabetes mellitus with other diabetic kidney complication: Secondary | ICD-10-CM

## 2019-07-05 DIAGNOSIS — I10 Essential (primary) hypertension: Secondary | ICD-10-CM | POA: Diagnosis present

## 2019-07-05 DIAGNOSIS — Z79899 Other long term (current) drug therapy: Secondary | ICD-10-CM | POA: Diagnosis not present

## 2019-07-05 DIAGNOSIS — E119 Type 2 diabetes mellitus without complications: Secondary | ICD-10-CM

## 2019-07-05 DIAGNOSIS — E871 Hypo-osmolality and hyponatremia: Secondary | ICD-10-CM | POA: Diagnosis not present

## 2019-07-05 DIAGNOSIS — Z20828 Contact with and (suspected) exposure to other viral communicable diseases: Secondary | ICD-10-CM | POA: Diagnosis present

## 2019-07-05 DIAGNOSIS — E1165 Type 2 diabetes mellitus with hyperglycemia: Secondary | ICD-10-CM | POA: Diagnosis present

## 2019-07-05 DIAGNOSIS — Z9049 Acquired absence of other specified parts of digestive tract: Secondary | ICD-10-CM

## 2019-07-05 DIAGNOSIS — E1169 Type 2 diabetes mellitus with other specified complication: Secondary | ICD-10-CM

## 2019-07-05 DIAGNOSIS — R1110 Vomiting, unspecified: Secondary | ICD-10-CM | POA: Diagnosis present

## 2019-07-05 DIAGNOSIS — Z90710 Acquired absence of both cervix and uterus: Secondary | ICD-10-CM | POA: Diagnosis not present

## 2019-07-05 DIAGNOSIS — K529 Noninfective gastroenteritis and colitis, unspecified: Secondary | ICD-10-CM | POA: Diagnosis not present

## 2019-07-05 DIAGNOSIS — R197 Diarrhea, unspecified: Secondary | ICD-10-CM | POA: Diagnosis present

## 2019-07-05 DIAGNOSIS — K589 Irritable bowel syndrome without diarrhea: Secondary | ICD-10-CM | POA: Diagnosis present

## 2019-07-05 DIAGNOSIS — R112 Nausea with vomiting, unspecified: Secondary | ICD-10-CM | POA: Diagnosis present

## 2019-07-05 DIAGNOSIS — F17200 Nicotine dependence, unspecified, uncomplicated: Secondary | ICD-10-CM | POA: Diagnosis present

## 2019-07-05 DIAGNOSIS — Z823 Family history of stroke: Secondary | ICD-10-CM | POA: Diagnosis not present

## 2019-07-05 DIAGNOSIS — Z833 Family history of diabetes mellitus: Secondary | ICD-10-CM | POA: Diagnosis not present

## 2019-07-05 DIAGNOSIS — N179 Acute kidney failure, unspecified: Secondary | ICD-10-CM | POA: Diagnosis not present

## 2019-07-05 DIAGNOSIS — Z87891 Personal history of nicotine dependence: Secondary | ICD-10-CM | POA: Diagnosis not present

## 2019-07-05 DIAGNOSIS — Z800 Family history of malignant neoplasm of digestive organs: Secondary | ICD-10-CM | POA: Diagnosis not present

## 2019-07-05 DIAGNOSIS — R1030 Lower abdominal pain, unspecified: Secondary | ICD-10-CM

## 2019-07-05 DIAGNOSIS — R1084 Generalized abdominal pain: Secondary | ICD-10-CM | POA: Diagnosis not present

## 2019-07-05 DIAGNOSIS — R109 Unspecified abdominal pain: Secondary | ICD-10-CM | POA: Diagnosis present

## 2019-07-05 DIAGNOSIS — Z91041 Radiographic dye allergy status: Secondary | ICD-10-CM | POA: Diagnosis not present

## 2019-07-05 DIAGNOSIS — Z79891 Long term (current) use of opiate analgesic: Secondary | ICD-10-CM

## 2019-07-05 DIAGNOSIS — Z801 Family history of malignant neoplasm of trachea, bronchus and lung: Secondary | ICD-10-CM

## 2019-07-05 DIAGNOSIS — Z720 Tobacco use: Secondary | ICD-10-CM | POA: Diagnosis present

## 2019-07-05 DIAGNOSIS — E860 Dehydration: Secondary | ICD-10-CM | POA: Diagnosis present

## 2019-07-05 LAB — URINALYSIS, ROUTINE W REFLEX MICROSCOPIC
Glucose, UA: 50 mg/dL — AB
Hgb urine dipstick: NEGATIVE
Ketones, ur: NEGATIVE mg/dL
Leukocytes,Ua: NEGATIVE
Nitrite: NEGATIVE
Protein, ur: >=300 mg/dL — AB
Specific Gravity, Urine: 1.028 (ref 1.005–1.030)
WBC, UA: >50 WBC/hpf — ABNORMAL HIGH (ref 0–5)
pH: 5 (ref 5.0–8.0)

## 2019-07-05 LAB — COMPREHENSIVE METABOLIC PANEL
ALT: 24 U/L (ref 0–44)
AST: 27 U/L (ref 15–41)
Albumin: 3.9 g/dL (ref 3.5–5.0)
Alkaline Phosphatase: 52 U/L (ref 38–126)
Anion gap: 11 (ref 5–15)
BUN: 21 mg/dL — ABNORMAL HIGH (ref 6–20)
CO2: 24 mmol/L (ref 22–32)
Calcium: 8.8 mg/dL — ABNORMAL LOW (ref 8.9–10.3)
Chloride: 101 mmol/L (ref 98–111)
Creatinine, Ser: 3.07 mg/dL — ABNORMAL HIGH (ref 0.44–1.00)
GFR calc Af Amer: 19 mL/min — ABNORMAL LOW (ref >60)
GFR calc non Af Amer: 16 mL/min — ABNORMAL LOW (ref >60)
Glucose, Bld: 111 mg/dL — ABNORMAL HIGH (ref 70–99)
Potassium: 3.7 mmol/L (ref 3.5–5.1)
Sodium: 136 mmol/L (ref 135–145)
Total Bilirubin: 0.5 mg/dL (ref 0.3–1.2)
Total Protein: 8.2 g/dL — ABNORMAL HIGH (ref 6.5–8.1)

## 2019-07-05 LAB — CBC WITH DIFFERENTIAL/PLATELET
Abs Immature Granulocytes: 0.02 10*3/uL (ref 0.00–0.07)
Basophils Absolute: 0 10*3/uL (ref 0.0–0.1)
Basophils Relative: 0 %
Eosinophils Absolute: 0.1 10*3/uL (ref 0.0–0.5)
Eosinophils Relative: 1 %
HCT: 35.8 % — ABNORMAL LOW (ref 36.0–46.0)
Hemoglobin: 10.9 g/dL — ABNORMAL LOW (ref 12.0–15.0)
Immature Granulocytes: 0 %
Lymphocytes Relative: 29 %
Lymphs Abs: 2.6 10*3/uL (ref 0.7–4.0)
MCH: 21.2 pg — ABNORMAL LOW (ref 26.0–34.0)
MCHC: 30.4 g/dL (ref 30.0–36.0)
MCV: 69.6 fL — ABNORMAL LOW (ref 80.0–100.0)
Monocytes Absolute: 0.6 10*3/uL (ref 0.1–1.0)
Monocytes Relative: 7 %
Neutro Abs: 5.5 10*3/uL (ref 1.7–7.7)
Neutrophils Relative %: 63 %
Platelets: 216 10*3/uL (ref 150–400)
RBC: 5.14 MIL/uL — ABNORMAL HIGH (ref 3.87–5.11)
RDW: 17.1 % — ABNORMAL HIGH (ref 11.5–15.5)
WBC: 8.9 10*3/uL (ref 4.0–10.5)
nRBC: 0 % (ref 0.0–0.2)

## 2019-07-05 LAB — HEMOGLOBIN A1C
Hgb A1c MFr Bld: 8.3 % — ABNORMAL HIGH (ref 4.8–5.6)
Mean Plasma Glucose: 191.51 mg/dL

## 2019-07-05 LAB — SARS CORONAVIRUS 2 BY RT PCR (HOSPITAL ORDER, PERFORMED IN ~~LOC~~ HOSPITAL LAB): SARS Coronavirus 2: NEGATIVE

## 2019-07-05 LAB — TSH: TSH: 1.504 u[IU]/mL (ref 0.350–4.500)

## 2019-07-05 LAB — LIPASE, BLOOD: Lipase: 103 U/L — ABNORMAL HIGH (ref 11–51)

## 2019-07-05 MED ORDER — ENOXAPARIN SODIUM 30 MG/0.3ML ~~LOC~~ SOLN
30.0000 mg | SUBCUTANEOUS | Status: DC
  Administered 2019-07-05 – 2019-07-06 (×2): 30 mg via SUBCUTANEOUS
  Filled 2019-07-05 (×2): qty 0.3

## 2019-07-05 MED ORDER — MORPHINE SULFATE (PF) 4 MG/ML IV SOLN
4.0000 mg | Freq: Once | INTRAVENOUS | Status: AC
  Administered 2019-07-05: 4 mg via INTRAVENOUS
  Filled 2019-07-05: qty 1

## 2019-07-05 MED ORDER — METRONIDAZOLE IN NACL 5-0.79 MG/ML-% IV SOLN
500.0000 mg | Freq: Three times a day (TID) | INTRAVENOUS | Status: DC
  Administered 2019-07-05 – 2019-07-07 (×6): 500 mg via INTRAVENOUS
  Filled 2019-07-05 (×6): qty 100

## 2019-07-05 MED ORDER — FENTANYL CITRATE (PF) 100 MCG/2ML IJ SOLN
50.0000 ug | INTRAMUSCULAR | Status: DC | PRN
  Administered 2019-07-05 – 2019-07-07 (×10): 50 ug via INTRAVENOUS
  Filled 2019-07-05 (×10): qty 2

## 2019-07-05 MED ORDER — ONDANSETRON HCL 4 MG/2ML IJ SOLN
4.0000 mg | Freq: Once | INTRAMUSCULAR | Status: AC
  Administered 2019-07-05: 06:00:00 4 mg via INTRAVENOUS
  Filled 2019-07-05: qty 2

## 2019-07-05 MED ORDER — CIPROFLOXACIN IN D5W 400 MG/200ML IV SOLN
400.0000 mg | INTRAVENOUS | Status: DC
  Administered 2019-07-05 – 2019-07-06 (×2): 400 mg via INTRAVENOUS
  Filled 2019-07-05 (×2): qty 200

## 2019-07-05 MED ORDER — PANTOPRAZOLE SODIUM 40 MG IV SOLR
40.0000 mg | INTRAVENOUS | Status: DC
  Administered 2019-07-05 – 2019-07-06 (×2): 40 mg via INTRAVENOUS
  Filled 2019-07-05 (×2): qty 40

## 2019-07-05 MED ORDER — ACETAMINOPHEN 650 MG RE SUPP: 650.0000 mg | Freq: Four times a day (QID) | RECTAL | Status: DC | PRN

## 2019-07-05 MED ORDER — DIPHENOXYLATE-ATROPINE 2.5-0.025 MG PO TABS: 1.0000 | ORAL_TABLET | Freq: Four times a day (QID) | ORAL | Status: AC | PRN

## 2019-07-05 MED ORDER — ACETAMINOPHEN 325 MG PO TABS: 650.0000 mg | ORAL_TABLET | Freq: Four times a day (QID) | ORAL | Status: DC | PRN

## 2019-07-05 MED ORDER — SODIUM CHLORIDE 0.9 % IV SOLN
INTRAVENOUS | Status: DC
  Administered 2019-07-05 – 2019-07-07 (×3): via INTRAVENOUS

## 2019-07-05 MED ORDER — ONDANSETRON HCL 4 MG/2ML IJ SOLN: 4.0000 mg | Freq: Four times a day (QID) | INTRAMUSCULAR | Status: DC | PRN

## 2019-07-05 MED ORDER — ONDANSETRON HCL 4 MG PO TABS: 4.0000 mg | ORAL_TABLET | Freq: Four times a day (QID) | ORAL | Status: DC | PRN

## 2019-07-05 MED ORDER — SODIUM CHLORIDE 0.9 % IV BOLUS
1000.0000 mL | Freq: Once | INTRAVENOUS | Status: AC
  Administered 2019-07-05: 1000 mL via INTRAVENOUS

## 2019-07-05 NOTE — ED Triage Notes (Signed)
Lower abd pain onset Tuesday with n/v/d.

## 2019-07-05 NOTE — Plan of Care (Signed)

## 2019-07-05 NOTE — ED Provider Notes (Signed)
Patient CT scan of the abdomen consistent with a diarrheal illness.  Enteritis or ileus.  No other acute findings.  However patient has a significant change in her renal function compared to pass levels.  Patient's had a large amount of diarrhea and vomiting since Tuesday.  Just since today has had 5 episodes of diarrhea.  No blood in either diarrhea or vomiting.  Will contact hospitalist for admission.   Fredia Sorrow, MD 07/05/19 [phone]

## 2019-07-05 NOTE — ED Notes (Signed)
Pt is drinking oral contrast for CT

## 2019-07-05 NOTE — ED Notes (Signed)
Pt back from CT

## 2019-07-05 NOTE — H&P (Signed)
History and Physical  Shawna Huerta FVC:[id number] DOB: 1961-09-20 DOA: 07/05/2019  Referring physician: Rogene Houston MD   PCP: Glenda Chroman, MD   Chief Complaint: abdominal pain   HPI: Shawna Huerta is a 58 y.o. female smoker with hypertension, history of IBS, type 2 diabetes mellitus reports that shortly after starting a new medication for her diabetes called Wilder Glade started having severe diarrhea, abdominal pain and symptoms seem to have worsened.  She is having nausea and vomiting and has not been able to eat or drink.  She is having sharp stabbing cramping pains in the lower abdomen.  She denies fever and chills.  She denies blood in stool.  She denies any known sick contacts.  She denies fever and chills.  She presented to the emergency department because overnight her abdominal pain seemed to be worsening.  ED course: The patient had a CT of the abdomen and pelvis with findings of fluid-filled small bowel loops and possible enteritis versus early ileus.  Sodium 136, potassium 3.7, BUN 21, creatinine 3.07, lipase 103.  White blood cell count 8.9, hemoglobin 10.9, platelet count 216.  The patient continued to have diarrhea.  With the acute kidney injury the patient was called for hospital admission.  Review of Systems: All systems reviewed and apart from history of presenting illness, are negative.  Past Medical History:  Diagnosis Date  . Anemia   . Arthritis   . Bartholin cyst 07/03/2014  . Colitis 08/2015   Treated at [facility]  . Diabetes mellitus   . Hypertension   . Labial abscess 08/2015   Status post I&D by Dr. Ladona Horns  . Nicotine addiction 09/03/2013  . Obesity   . Pancreatitis 09/2015   unknown etiology, no further work-up with gastroenterology   . Vaginal itching 09/03/2013   Past Surgical History:  Procedure Laterality Date  . ABDOMINAL HYSTERECTOMY    . ABDOMINAL SURGERY     exploratry with TAH  . BALLOON DILATION N/A 10/26/2016   Procedure: Pyloric channel  BALLOON DILATION;  Surgeon: Daneil Dolin, MD;  [facility];  Service: Endoscopy;  Laterality: N/A;  . BARTHOLIN GLAND CYST EXCISION Right 08/20/2014   Procedure: EXCISION OF RIGHT BARTHOLIN'S GLAND CYST;  Surgeon: Florian Buff, MD;  Location: [facility] ORS;  Service: Gynecology;  Laterality: Right;  . CHOLECYSTECTOMY    . COLONOSCOPY  10/2015   Doristine Mango: mild diverticulosis, at least 8 polpys removed, multiple tubular adenomas. next tcs in 1 year  . ESOPHAGOGASTRODUODENOSCOPY N/A 10/26/2016   Procedure: ESOPHAGOGASTRODUODENOSCOPY (EGD);  Surgeon: Daneil Dolin, MD;  Location: [facility];  Service: Endoscopy;  Laterality: N/A;  . FOOT SURGERY    . FOOT SURGERY Right 2011   shave bone  . TONSILLECTOMY     Social History:  reports that she quit smoking about 2 years ago. Her smoking use included cigarettes. She has a 0.75 pack-year smoking history. She has never used smokeless tobacco. She reports that she does not drink alcohol or use drugs.  Allergies  Allergen Reactions  . Iohexol Swelling     Code: HIVES, Desc: PT STATES THE EYES SWOLL AND ITCHED, NEEDS PRE MEDS     Family History  Problem Relation Age of Onset  . Diabetes Mother   . Stroke Mother   . Cancer Father        liver and colon. Patient unsure primary   . Stomach cancer Father   . Diabetes Sister   . Diabetes  Brother   . Lung cancer Brother     Prior to Admission medications   Medication Sig Start Date End Date Taking? Authorizing Provider  dexamethasone (DECADRON) 4 MG tablet Take 1 tablet (4 mg total) by mouth 2 (two) times daily with a meal. 12/04/17   Lily Kocher, PA-C  dicyclomine (BENTYL) 20 MG tablet Take 1 tablet (20 mg total) by mouth 2 (two) times daily. Patient not taking: Reported on 12/28/2016 10/20/16   Kinnie Feil, PA-C  ferrous sulfate 325 (65 FE) MG EC tablet Take 325 mg by mouth daily with breakfast.    [provider]  glyBURIDE-metformin (GLUCOVANCE) 5-500 MG  per tablet Take 1 tablet by mouth 2 (two) times daily.    [provider]  HYDROcodone-acetaminophen (NORCO/VICODIN) 5-325 MG tablet Take 1 tablet by mouth every 4 (four) hours as needed. 12/04/17   Lily Kocher, PA-C  lisinopril (PRINIVIL,ZESTRIL) 10 MG tablet Take 10 mg by mouth daily. 03/12/15   [provider]  metoCLOPramide (REGLAN) 5 MG tablet Take 1 tablet (5 mg total) by mouth 4 (four) times daily -  before meals and at bedtime. Patient not taking: Reported on 12/28/2016 10/27/16   Isaac Bliss, Rayford Halsted, MD  ondansetron (ZOFRAN ODT) 4 MG disintegrating tablet Take 1 tablet (4 mg total) by mouth every 8 (eight) hours as needed for nausea or vomiting. 10/20/16   Kinnie Feil, PA-C  pantoprazole (PROTONIX) 40 MG tablet Take 1 tablet (40 mg total) by mouth 2 (two) times daily before a meal. 10/27/16   Isaac Bliss, Rayford Halsted, MD   Physical Exam: Vitals:   07/05/19 0545 07/05/19 0546 07/05/19 0900  BP:  128/72 (!) 114/47  Pulse:  85 60  Resp:  18 11  Temp:  98.6 F (37 C)   TempSrc:  Oral   SpO2:  100% 99%  Weight: 120.2 kg    Height: 5\' 7"  (1.702 m)       General exam: Moderately built and nourished patient, lying comfortably supine on the gurney in no obvious distress.  Head, eyes and ENT: Nontraumatic and normocephalic. Pupils equally reacting to light and accommodation. Oral mucosa dry.  Neck: Supple. No JVD, carotid bruit or thyromegaly.  Lymphatics: No lymphadenopathy.  Respiratory system: Clear to auscultation. No increased work of breathing.  Cardiovascular system: S1 and S2 heard, RRR. No JVD, murmurs, gallops, clicks or pedal edema.  Gastrointestinal system: Abdomen is nondistended, soft and there is diffuse tenderness to palpation in the lower abdomen but no guarding. Normal bowel sounds heard. No organomegaly or masses appreciated.  Central nervous system: Alert and oriented. No focal neurological deficits.  Extremities: Symmetric 5  x 5 power. Peripheral pulses symmetrically felt.   Skin: No rashes or acute findings.  Musculoskeletal system: Negative exam.  Psychiatry: Pleasant and cooperative.  Labs on Admission:  Basic Metabolic Panel: Recent Labs  Lab 07/05/19 0607  NA 136  K 3.7  CL 101  CO2 24  GLUCOSE 111*  BUN 21*  CREATININE 3.07*  CALCIUM 8.8*   Liver Function Tests: Recent Labs  Lab 07/05/19 0607  AST 27  ALT 24  ALKPHOS 52  BILITOT 0.5  PROT 8.2*  ALBUMIN 3.9   Recent Labs  Lab 07/05/19 0607  LIPASE 103*   No results for input(s): AMMONIA in the last 168 hours. CBC: Recent Labs  Lab 07/05/19 0607  WBC 8.9  NEUTROABS 5.5  HGB 10.9*  HCT 35.8*  MCV 69.6*  PLT 216  Cardiac Enzymes: No results for input(s): CKTOTAL, CKMB, CKMBINDEX, TROPONINI in the last 168 hours.  BNP (last 3 results) No results for input(s): PROBNP in the last 8760 hours. CBG: No results for input(s): GLUCAP in the last 168 hours.  Radiological Exams on Admission: Ct Abdomen Pelvis Wo Contrast  Result Date: 07/05/2019 CLINICAL DATA:  Lower abdominal pain with nausea and vomiting EXAM: CT ABDOMEN AND PELVIS WITHOUT CONTRAST TECHNIQUE: Multidetector CT imaging of the abdomen and pelvis was performed following the standard protocol without IV contrast. Oral contrast was administered. COMPARISON:  October 23, 2016 FINDINGS: Lower chest: There are scattered foci of bibasilar atelectasis. There is no lung base edema or consolidation. Hepatobiliary: Liver measures 19.1 cm in length. There is hepatic steatosis. No focal liver lesions are evident on this noncontrast enhanced study. Gallbladder is absent. There is no appreciable biliary duct dilatation. Pancreas: No pancreatic mass or inflammatory focus. Spleen: No splenicw lesions are evident. Adrenals/Urinary Tract: Adrenals bilaterally appear normal. Kidneys bilaterally show no evident mass or hydronephrosis. There is no evident renal or ureteral calculus on  either side. Urinary bladder is midline with wall thickness within normal limits. Stomach/Bowel: There are scattered colonic diverticula without demonstrable diverticulitis. There is no appreciable bowel wall thickening. Most small bowel loops are fluid filled. There is no demonstrable bowel obstruction. Terminal ileum appears normal. There is no evident free air or portal venous air. Vascular/Lymphatic: w no abdominal aortic aneurysm. No vascular lesions are appreciable on this noncontrast enhanced study. There is no appreciable adenopathy in the abdomen or pelvis by size criteria. There are subcentimeter common femoral and inguinal lymph nodes which are considered nonspecific. Reproductive: Uterus is absent. No pelvic mass evident. A surgical clip is noted in the right pelvis. Other: Appendix appears normal. No abscess or ascites evident in the abdomen or pelvis. Musculoskeletal: There is degenerative change in the lumbar spine. There are no blastic or lytic bone lesions. There is a degree of sacroiliitis bilaterally, stable. No intramuscular or abdominal wall lesions evident. IMPRESSION: 1. Most small bowel loops are fluid filled. While this finding may be seen normally, it may be indicative of a degree of underlying enteritis or early ileus. No bowel obstruction. There are scattered colonic diverticula without diverticulitis evident. 2.  Appendix appears normal.  No abscess in the abdomen or pelvis. 3. No renal or ureteral calculi. No hydronephrosis. Urinary bladder wall thickness is within normal limits. 4.  Gallbladder absent.  Uterus absent. 5.  Stable changes of a degree of sacroiliitis bilaterally. Electronically Signed   By: Lowella Grip III M.D.   On: 07/05/2019 08:09   Assessment/Plan Principal Problem:   ARF (acute renal failure) (HCC) Active Problems:   Diabetes (HCC)   Nicotine addiction   Diarrhea   Non-intractable vomiting with nausea   Type 2 diabetes mellitus without complication  (HCC)   Essential hypertension   Tobacco abuse   IBS (irritable bowel syndrome)   Type 2 diabetes mellitus (HCC)   Abdominal pain   Enteritis  1. Acute kidney injury-suspect this is a prerenal azotemia as she is severely dehydrated however we do not have recent lab testing to see what her creatinine baseline is.  She did have a normal creatinine in 2017.  She had been taking lisinopril which is discontinued at this time while she is recovering.  The patient is started on IV fluid hydration.  Renally dose medications as appropriate.  Follow renal function panel.  Ultrasound of the kidneys is ordered. 2. Enteritis-  patient continues to have diffuse diarrhea I have ordered stool studies.  The patient has been empirically started on Cipro and Flagyl.  The patient is being treated with supportive therapy with IV fluids.  Continue to monitor closely. 3. Abdominal pain-suspect patient's symptoms are secondary to enteritis.  We will continue to monitor closely with serial abdominal examination.  IV fentanyl ordered for pain symptoms.  IV antibiotics were ordered to treat the enteritis noted above. 4. Essential hypertension- blood pressures are soft at this time we are holding home lisinopril in the setting of acute kidney injury.  Follow. 5. Tobacco-will offer nicotine patch to use while in the hospital for cravings. 6. Type 2 diabetes mellitus- in the setting of AKI and poor oral intake we are holding insulin at this time and holding home oral diabetes medications.  DVT Prophylaxis: Lovenox Code Status: Full Family Communication: Patient updated at bedside Disposition Plan: Inpatient MedSurg for IV fluids and IV antibiotics  Time spent: 57 minutes  Shereece Wellborn Wynetta Emery, MD [facility] How to contact the [facility] Attending or Consulting provider [place] or covering provider during after hours [place], for this patient?  1. Check the care team in [facility] and look for a) attending/consulting [facility] provider  listed and b) the [facility] team listed 2. Log into [email or web address] and use [place]'s universal password to access. If you do not have the password, please contact the hospital operator. 3. Locate the [facility] provider you are looking for under [facility] and page to a number that you can be directly reached. 4. If you still have difficulty reaching the provider, please page the [facility] (Director on Call) for the Hospitalists listed on amion for assistance.

## 2019-07-05 NOTE — ED Notes (Signed)
Dr. Zackowski at bedside  

## 2019-07-05 NOTE — ED Notes (Signed)
Pt to CT

## 2019-07-05 NOTE — ED Provider Notes (Signed)
[facility] EMERGENCY DEPARTMENT Provider Note   CSN: [id number] Arrival date & time: 07/05/19  0532    History   Chief Complaint Chief Complaint  Patient presents with  . Abdominal Pain    n/v/d    HPI Shawna Huerta is a 58 y.o. female.     Patient presents for evaluation of abdominal pain with associated nausea, vomiting and diarrhea.  Patient reports that she was seen at her doctor's office earlier this week for routine diabetes follow-up.  She had some medications changed and then the following day she started having nausea, vomiting and diarrhea.  For the last 2 days her symptoms have progressively worsened.  She is complaining of sharp, stabbing and crampy pain across the lower abdomen.  She cannot eat or drink because she either immediately vomits or has diarrhea.     Past Medical History:  Diagnosis Date  . Anemia   . Arthritis   . Bartholin cyst 07/03/2014  . Colitis 08/2015   Treated at [facility]  . Diabetes mellitus   . Hypertension   . Labial abscess 08/2015   Status post I&D by Dr. Ladona Horns  . Nicotine addiction 09/03/2013  . Obesity   . Pancreatitis 09/2015   unknown etiology, no further work-up with gastroenterology   . Vaginal itching 09/03/2013    Patient Active Problem List   Diagnosis Date Noted  . 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 Normocytic anemia 12/28/2016  . Postviral gastroparesis   . Type 2 diabetes mellitus ([place]) 10/23/2016  . AKI (acute kidney injury) ([place]) 10/23/2016  . IBS (irritable bowel syndrome) 10/12/2015  . Acute pancreatitis 10/10/2015  . Diarrhea 10/10/2015  . Non-intractable vomiting with nausea 10/10/2015  . Type 2 diabetes mellitus without complication ([place]) 62/37/6283  . Labial abscess 10/10/2015  . Essential hypertension 10/10/2015  . Tobacco abuse 10/10/2015  . Bartholin cyst 07/03/2014  . Diabetes ([place]) 09/03/2013  . Obesity 09/03/2013  . Vaginal itching 09/03/2013  . Nicotine addiction 09/03/2013  . 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 TENOSYNOVITIS OF FOOT AND ANKLE 09/22/2010  . FOOT PAIN, BILATERAL 09/22/2010    Past Surgical History:  Procedure Laterality Date  . ABDOMINAL HYSTERECTOMY    . ABDOMINAL SURGERY     exploratry with TAH  . BALLOON DILATION N/A 10/26/2016   Procedure: Pyloric channel BALLOON DILATION;  Surgeon: Daneil Dolin, MD;  Location: [facility];  Service: Endoscopy;  Laterality: N/A;  . BARTHOLIN GLAND CYST EXCISION Right 08/20/2014   Procedure: EXCISION OF RIGHT BARTHOLIN'S GLAND CYST;  Surgeon: Florian Buff, MD;  Location: [facility];  Service: Gynecology;  Laterality: Right;  . CHOLECYSTECTOMY    . COLONOSCOPY  10/2015   Doristine Mango: mild diverticulosis, at least 8 polpys removed, multiple tubular adenomas. next tcs in 1 year  . ESOPHAGOGASTRODUODENOSCOPY N/A 10/26/2016   Procedure: ESOPHAGOGASTRODUODENOSCOPY (EGD);  Surgeon: Daneil Dolin, MD;  Location[facility];  Service: Endoscopy;  Laterality: N/A;  . FOOT SURGERY    . FOOT SURGERY Right 2011   shave bone  . TONSILLECTOMY       OB History    Gravida  2   Para  1   Term  1   Preterm      AB  1   Living  1     SAB  1   TAB      Ectopic      Multiple      Live Births  Home Medications    Prior to Admission medications   Medication Sig Start Date End Date Taking? Authorizing Provider  dexamethasone (DECADRON) 4 MG tablet Take 1 tablet (4 mg total) by mouth 2 (two) times daily with a meal. 12/04/17   Lily Kocher, PA-C  dicyclomine (BENTYL) 20 MG tablet Take 1 tablet (20 mg total) by mouth 2 (two) times daily. Patient not taking: Reported on 12/28/2016 10/20/16   Kinnie Feil, PA-C  ferrous sulfate 325 (65 FE) MG EC tablet Take 325 mg by mouth daily with breakfast.    [provider]  glyBURIDE-metformin (GLUCOVANCE) 5-500 MG per tablet Take 1 tablet by mouth 2 (two) times daily.    [provider]  HYDROcodone-acetaminophen  (NORCO/VICODIN) 5-325 MG tablet Take 1 tablet by mouth every 4 (four) hours as needed. 12/04/17   Lily Kocher, PA-C  lisinopril (PRINIVIL,ZESTRIL) 10 MG tablet Take 10 mg by mouth daily. 03/12/15   [provider]  metoCLOPramide (REGLAN) 5 MG tablet Take 1 tablet (5 mg total) by mouth 4 (four) times daily -  before meals and at bedtime. Patient not taking: Reported on 12/28/2016 10/27/16   Isaac Bliss, Rayford Halsted, MD  ondansetron (ZOFRAN ODT) 4 MG disintegrating tablet Take 1 tablet (4 mg total) by mouth every 8 (eight) hours as needed for nausea or vomiting. 10/20/16   Kinnie Feil, PA-C  pantoprazole (PROTONIX) 40 MG tablet Take 1 tablet (40 mg total) by mouth 2 (two) times daily before a meal. 10/27/16   Isaac Bliss, Rayford Halsted, MD    Family History Family History  Problem Relation Age of Onset  . Diabetes Mother   . Stroke Mother   . Cancer Father        liver and colon. Patient unsure primary   . Stomach cancer Father   . Diabetes Sister   . Diabetes Brother   . Lung cancer Brother     Social History Social History   Tobacco Use  . Smoking status: Former Smoker    Packs/day: 0.25    Years: 3.00    Pack years: 0.75    Types: Cigarettes    Quit date: 10/21/2016    Years since quitting: 2.7  . Smokeless tobacco: Never Used  Substance Use Topics  . Alcohol use: No    Comment: occ.  . Drug use: No     Allergies   Iohexol   Review of Systems Review of Systems  Gastrointestinal: Positive for abdominal pain, diarrhea, nausea and vomiting.  All other systems reviewed and are negative.    Physical Exam Updated Vital Signs BP 128/72 (BP Location: Left Arm)   Pulse 85   Temp 98.6 F (37 C) (Oral)   Resp 18   Ht 5\' 7"  (1.702 m)   Wt 120.2 kg   SpO2 100%   BMI 41.50 kg/m   Physical Exam Vitals signs and nursing note reviewed.  Constitutional:      General: She is not in acute distress.    Appearance: Normal appearance. She is  well-developed.  HENT:     Head: Normocephalic and atraumatic.     Right Ear: Hearing normal.     Left Ear: Hearing normal.     Nose: Nose normal.  Eyes:     Conjunctiva/sclera: Conjunctivae normal.     Pupils: Pupils are equal, round, and reactive to light.  Neck:     Musculoskeletal: Normal range of motion and neck supple.  Cardiovascular:     Rate  and Rhythm: Regular rhythm.     Heart sounds: S1 normal and S2 normal. No murmur. No friction rub. No gallop.   Pulmonary:     Effort: Pulmonary effort is normal. No respiratory distress.     Breath sounds: Normal breath sounds.  Chest:     Chest wall: No tenderness.  Abdominal:     General: Bowel sounds are normal.     Palpations: Abdomen is soft.     Tenderness: There is abdominal tenderness in the right lower quadrant, periumbilical area, suprapubic area and left lower quadrant. There is no guarding or rebound. Negative signs include Murphy's sign and McBurney's sign.     Hernia: No hernia is present.  Musculoskeletal: Normal range of motion.  Skin:    General: Skin is warm and dry.     Findings: No rash.  Neurological:     Mental Status: She is alert and oriented to person, place, and time.     GCS: GCS eye subscore is 4. GCS verbal subscore is 5. GCS motor subscore is 6.     Cranial Nerves: No cranial nerve deficit.     Sensory: No sensory deficit.     Coordination: Coordination normal.  Psychiatric:        Speech: Speech normal.        Behavior: Behavior normal.        Thought Content: Thought content normal.      ED Treatments / Results  Labs (all labs ordered are listed, but only abnormal results are displayed) Labs Reviewed  CBC WITH DIFFERENTIAL/PLATELET  COMPREHENSIVE METABOLIC PANEL  LIPASE, BLOOD  URINALYSIS, ROUTINE W REFLEX MICROSCOPIC    EKG None  Radiology No results found.  Procedures Procedures (including critical care time)  Medications Ordered in ED Medications  sodium chloride 0.9 %  bolus 1,000 mL (has no administration in time range)  ondansetron (ZOFRAN) injection 4 mg (has no administration in time range)  morphine 4 MG/ML injection 4 mg (has no administration in time range)     Initial Impression / Assessment and Plan / ED Course  I have reviewed the triage vital signs and the nursing notes.  Pertinent labs & imaging results that were available during my care of the patient were reviewed by me and considered in my medical decision making (see chart for details).        Patient presents to the emergency department for evaluation of abdominal pain with associated nausea, vomiting and diarrhea.  She has had a previous cholecystectomy.  No other abdominal surgeries.  No history of obstruction.  She does report a history of diverticulosis.  Patient has had symptoms for 3 days now, progressively worsening.  Abdominal exam reveals diffuse tenderness in the lower and mid abdomen.  No obvious peritonitis.  We will perform abdominal pain work-up.  Lab work ordered and patient will undergo CT abdomen and pelvis.  She has an iohexol allergy, will perform study with oral but no IV contrast.  Will sign out to oncoming ER physician to follow-up on labs and CT scan, disposition patient accordingly.  Final Clinical Impressions(s) / ED Diagnoses   Final diagnoses:  Lower abdominal pain    ED Discharge Orders    None       Orpah Greek, MD 07/05/19 [phone]

## 2019-07-06 DIAGNOSIS — R112 Nausea with vomiting, unspecified: Secondary | ICD-10-CM

## 2019-07-06 DIAGNOSIS — E871 Hypo-osmolality and hyponatremia: Secondary | ICD-10-CM

## 2019-07-06 DIAGNOSIS — Z720 Tobacco use: Secondary | ICD-10-CM

## 2019-07-06 DIAGNOSIS — R197 Diarrhea, unspecified: Secondary | ICD-10-CM

## 2019-07-06 LAB — CBC WITH DIFFERENTIAL/PLATELET
Abs Immature Granulocytes: 0.01 10*3/uL (ref 0.00–0.07)
Basophils Absolute: 0 10*3/uL (ref 0.0–0.1)
Basophils Relative: 0 %
Eosinophils Absolute: 0.1 10*3/uL (ref 0.0–0.5)
Eosinophils Relative: 2 %
HCT: 32.3 % — ABNORMAL LOW (ref 36.0–46.0)
Hemoglobin: 9.5 g/dL — ABNORMAL LOW (ref 12.0–15.0)
Immature Granulocytes: 0 %
Lymphocytes Relative: 35 %
Lymphs Abs: 2.1 10*3/uL (ref 0.7–4.0)
MCH: 21.1 pg — ABNORMAL LOW (ref 26.0–34.0)
MCHC: 29.4 g/dL — ABNORMAL LOW (ref 30.0–36.0)
MCV: 71.8 fL — ABNORMAL LOW (ref 80.0–100.0)
Monocytes Absolute: 0.5 10*3/uL (ref 0.1–1.0)
Monocytes Relative: 8 %
Neutro Abs: 3.3 10*3/uL (ref 1.7–7.7)
Neutrophils Relative %: 55 %
Platelets: 202 10*3/uL (ref 150–400)
RBC: 4.5 MIL/uL (ref 3.87–5.11)
RDW: 17 % — ABNORMAL HIGH (ref 11.5–15.5)
WBC: 6.1 10*3/uL (ref 4.0–10.5)
nRBC: 0 % (ref 0.0–0.2)

## 2019-07-06 LAB — GLUCOSE, CAPILLARY
Glucose-Capillary: 129 mg/dL — ABNORMAL HIGH (ref 70–99)
Glucose-Capillary: 119 mg/dL — ABNORMAL HIGH (ref 70–99)
Glucose-Capillary: 133 mg/dL — ABNORMAL HIGH (ref 70–99)

## 2019-07-06 LAB — RENAL FUNCTION PANEL
Albumin: 3.3 g/dL — ABNORMAL LOW (ref 3.5–5.0)
Anion gap: 9 (ref 5–15)
BUN: 23 mg/dL — ABNORMAL HIGH (ref 6–20)
CO2: 25 mmol/L (ref 22–32)
Calcium: 8.5 mg/dL — ABNORMAL LOW (ref 8.9–10.3)
Chloride: 105 mmol/L (ref 98–111)
Creatinine, Ser: 2.08 mg/dL — ABNORMAL HIGH (ref 0.44–1.00)
GFR calc Af Amer: 30 mL/min — ABNORMAL LOW (ref >60)
GFR calc non Af Amer: 26 mL/min — ABNORMAL LOW (ref >60)
Glucose, Bld: 86 mg/dL (ref 70–99)
Phosphorus: 3.4 mg/dL (ref 2.5–4.6)
Potassium: 3.8 mmol/L (ref 3.5–5.1)
Sodium: 139 mmol/L (ref 135–145)

## 2019-07-06 LAB — LIPASE, BLOOD: Lipase: 33 U/L (ref 11–51)

## 2019-07-06 LAB — HIV ANTIBODY (ROUTINE TESTING W REFLEX): HIV Screen 4th Generation wRfx: NONREACTIVE

## 2019-07-06 LAB — MAGNESIUM: Magnesium: 1.8 mg/dL (ref 1.7–2.4)

## 2019-07-06 MED ORDER — ENOXAPARIN SODIUM 40 MG/0.4ML ~~LOC~~ SOLN: 40.0000 mg | SUBCUTANEOUS | Status: DC

## 2019-07-06 MED ORDER — INSULIN ASPART 100 UNIT/ML ~~LOC~~ SOLN
0.0000 [IU] | Freq: Three times a day (TID) | SUBCUTANEOUS | Status: DC
  Administered 2019-07-06: 1 [IU] via SUBCUTANEOUS

## 2019-07-06 MED ORDER — INSULIN ASPART 100 UNIT/ML ~~LOC~~ SOLN
2.0000 [IU] | Freq: Three times a day (TID) | SUBCUTANEOUS | Status: DC
  Administered 2019-07-06 (×2): 2 [IU] via SUBCUTANEOUS

## 2019-07-06 NOTE — Progress Notes (Signed)
PROGRESS NOTE  Shawna Huerta  ZOX:[id number]  DOB: [birth year]  DOA: 07/05/2019 PCP: Glenda Chroman, MD   Brief Admission Hx: 58 y.o. female smoker with hypertension, history of IBS, type 2 diabetes mellitus reports that shortly after starting a new medication for her diabetes called Wilder Glade started having severe diarrhea, abdominal pain and symptoms seem to have worsened.  MDM/Assessment & Plan:   1. Acute kidney injury-improving as creatinine trended down to 2.0.  This injury is due to prerenal azotemia as she was severely dehydrated however we do not have recent lab testing to see what her creatinine baseline is.  She did have a normal creatinine in 2017.  She had been taking lisinopril which is discontinued at this time while she is recovering.  Continue IV fluid hydration.  Renally dose medications as appropriate.  Follow renal function panel.  Ultrasound of the kidneys is negative for significant finding or abnormality. 2. Enteritis- pt reports her diarrhea has resolved.  The patient has been empirically started on Cipro and Flagyl and plan to tx for 3 days.  The patient is being treated with supportive therapy with IV fluids.  DC enteric precautions as diarrhea has resolved. 3. Abdominal pain-improving, suspect patient's symptoms are secondary to enteritis.  We will continue to monitor closely with serial abdominal examination.  IV fentanyl ordered for pain symptoms.  IV antibiotics were ordered to treat the enteritis noted above. 4. Essential hypertension- blood pressures are improving with hydration.  Follow. 5. Tobacco-will offer nicotine patch to use while in the hospital for cravings. 6. Type 2 diabetes mellitus-poorly controlled as evidenced by A1c 8.6%.     DVT Prophylaxis: Lovenox Code Status: Full Family Communication: Patient updated at bedside Disposition Plan: Inpatient MedSurg for IV fluids and IV antibiotics   Subjective: Pt says that diarrhea has resolved.  Abdominal  pain better controlled, tolerating full liquid diet.  No emesis.   Objective: Vitals:   07/05/19 1956 07/05/19 2256 07/06/19 0300 07/06/19 0358  BP:  (!) 103/57  116/67  Pulse:  72  67  Resp:  16  15  Temp:  98.6 F (37 C)  98 F (36.7 C)  TempSrc:  Oral  Oral  SpO2: 93% 100%  100%  Weight:   121.8 kg   Height:        Intake/Output Summary (Last 24 hours) at 07/06/2019 1037 Last data filed at 07/06/2019 8119 Gross per 24 hour  Intake 1382.08 ml  Output 300 ml  Net 1082.08 ml   Filed Weights   07/05/19 0545 07/05/19 1139 07/06/19 0300  Weight: 120.2 kg 120.2 kg 121.8 kg    REVIEW OF SYSTEMS  As per history otherwise all reviewed and reported negative  Exam:  General exam: awake, alert, NAD. Cooperative.  Respiratory system: Clear. No increased work of breathing. Cardiovascular system: S1 & S2 heard. No JVD, murmurs, gallops, clicks or pedal edema. Gastrointestinal system: Abdomen is nondistended, soft and mild RLQ and suprapubic tenderness. Normal bowel sounds heard. Central nervous system: Alert and oriented. No focal neurological deficits. Extremities: no CCE.  Data Reviewed: Basic Metabolic Panel: Recent Labs  Lab 07/05/19 0607 07/06/19 0558  NA 136 139  K 3.7 3.8  CL 101 105  CO2 24 25  GLUCOSE 111* 86  BUN 21* 23*  CREATININE 3.07* 2.08*  CALCIUM 8.8* 8.5*  MG  --  1.8  PHOS  --  3.4   Liver Function Tests: Recent Labs  Lab 07/05/19 0607 07/06/19 0558  AST  27  --   ALT 24  --   ALKPHOS 52  --   BILITOT 0.5  --   PROT 8.2*  --   ALBUMIN 3.9 3.3*   Recent Labs  Lab 07/05/19 0607 07/06/19 0745  LIPASE 103* 33   No results for input(s): AMMONIA in the last 168 hours. CBC: Recent Labs  Lab 07/05/19 0607 07/06/19 0558  WBC 8.9 6.1  NEUTROABS 5.5 3.3  HGB 10.9* 9.5*  HCT 35.8* 32.3*  MCV 69.6* 71.8*  PLT 216 202   Cardiac Enzymes: No results for input(s): CKTOTAL, CKMB, CKMBINDEX, TROPONINI in the last 168 hours. CBG (last 3)   Recent Labs    07/06/19 1006  GLUCAP 129*   Recent Results (from the past 240 hour(s))  SARS Coronavirus 2 [facility] order, Performed in [facility] hospital lab) Nasopharyngeal Nasopharyngeal Swab     Status: None   Collection Time: 07/05/19  8:22 AM   Specimen: Nasopharyngeal Swab  Result Value Ref Range Status   SARS Coronavirus 2 NEGATIVE NEGATIVE Final    Comment: (NOTE) If result is NEGATIVE SARS-CoV-2 target nucleic acids are NOT DETECTED. The SARS-CoV-2 RNA is generally detectable in upper and lower  respiratory specimens during the acute phase of infection. The lowest  concentration of SARS-CoV-2 viral copies this assay can detect is 250  copies / mL. A negative result does not preclude SARS-CoV-2 infection  and should not be used as the sole basis for treatment or other  patient management decisions.  A negative result may occur with  improper specimen collection / handling, submission of specimen other  than nasopharyngeal swab, presence of viral mutation(s) within the  areas targeted by this assay, and inadequate number of viral copies  (<250 copies / mL). A negative result must be combined with clinical  observations, patient history, and epidemiological information. If result is POSITIVE SARS-CoV-2 target nucleic acids are DETECTED. The SARS-CoV-2 RNA is generally detectable in upper and lower  respiratory specimens dur ing the acute phase of infection.  Positive  results are indicative of active infection with SARS-CoV-2.  Clinical  correlation with patient history and other diagnostic information is  necessary to determine patient infection status.  Positive results do  not rule out bacterial infection or co-infection with other viruses. If result is PRESUMPTIVE POSTIVE SARS-CoV-2 nucleic acids MAY BE PRESENT.   A presumptive positive result was obtained on the submitted specimen  and confirmed on repeat testing.  While 2019 novel coronavirus  (SARS-CoV-2)  nucleic acids may be present in the submitted sample  additional confirmatory testing may be necessary for epidemiological  and / or clinical management purposes  to differentiate between  SARS-CoV-2 and other Sarbecovirus currently known to infect humans.  If clinically indicated additional testing with an alternate test  methodology [phone]) is advised. The SARS-CoV-2 RNA is generally  detectable in upper and lower respiratory sp ecimens during the acute  phase of infection. The expected result is Negative. Fact Sheet for Patients:  StrictlyIdeas.no Fact Sheet for Healthcare Providers: BankingDealers.co.za This test is not yet approved or cleared by the [place] FDA and has been authorized for detection and/or diagnosis of SARS-CoV-2 by FDA under an Emergency Use Authorization (EUA).  This EUA will remain in effect (meaning this test can be used) for the duration of the COVID-19 declaration under Section 564(b)(1) of the Act, 21 U.S.C. section 360bbb-3(b)(1), unless the authorization is terminated or revoked sooner. Performed at [facility], [address], [place],  [place] [address]      Studies: Ct Abdomen Pelvis Wo Contrast  Result Date: 07/05/2019 CLINICAL DATA:  Lower abdominal pain with nausea and vomiting EXAM: CT ABDOMEN AND PELVIS WITHOUT CONTRAST TECHNIQUE: Multidetector CT imaging of the abdomen and pelvis was performed following the standard protocol without IV contrast. Oral contrast was administered. COMPARISON:  October 23, 2016 FINDINGS: Lower chest: There are scattered foci of bibasilar atelectasis. There is no lung base edema or consolidation. Hepatobiliary: Liver measures 19.1 cm in length. There is hepatic steatosis. No focal liver lesions are evident on this noncontrast enhanced study. Gallbladder is absent. There is no appreciable biliary duct dilatation. Pancreas: No pancreatic mass or inflammatory focus. Spleen:  No splenicw lesions are evident. Adrenals/Urinary Tract: Adrenals bilaterally appear normal. Kidneys bilaterally show no evident mass or hydronephrosis. There is no evident renal or ureteral calculus on either side. Urinary bladder is midline with wall thickness within normal limits. Stomach/Bowel: There are scattered colonic diverticula without demonstrable diverticulitis. There is no appreciable bowel wall thickening. Most small bowel loops are fluid filled. There is no demonstrable bowel obstruction. Terminal ileum appears normal. There is no evident free air or portal venous air. Vascular/Lymphatic: w no abdominal aortic aneurysm. No vascular lesions are appreciable on this noncontrast enhanced study. There is no appreciable adenopathy in the abdomen or pelvis by size criteria. There are subcentimeter common femoral and inguinal lymph nodes which are considered nonspecific. Reproductive: Uterus is absent. No pelvic mass evident. A surgical clip is noted in the right pelvis. Other: Appendix appears normal. No abscess or ascites evident in the abdomen or pelvis. Musculoskeletal: There is degenerative change in the lumbar spine. There are no blastic or lytic bone lesions. There is a degree of sacroiliitis bilaterally, stable. No intramuscular or abdominal wall lesions evident. IMPRESSION: 1. Most small bowel loops are fluid filled. While this finding may be seen normally, it may be indicative of a degree of underlying enteritis or early ileus. No bowel obstruction. There are scattered colonic diverticula without diverticulitis evident. 2.  Appendix appears normal.  No abscess in the abdomen or pelvis. 3. No renal or ureteral calculi. No hydronephrosis. Urinary bladder wall thickness is within normal limits. 4.  Gallbladder absent.  Uterus absent. 5.  Stable changes of a degree of sacroiliitis bilaterally. Electronically Signed   By: Lowella Grip III M.D.   On: 07/05/2019 08:09   US Renal  Result Date:  07/05/2019 CLINICAL DATA:  Acute renal insufficiency. EXAM: RENAL / URINARY TRACT ULTRASOUND COMPLETE COMPARISON:  CT of the abdomen from the same date FINDINGS: Right Kidney: Renal measurements: 11.5 x 5.1 x 8.2 cm = volume: 254 mL . Echogenicity within normal limits. No mass or hydronephrosis visualized. Left Kidney: Renal measurements: 12.2 x 7.3 x 5.0 cm = volume: 235 mL. Echogenicity within normal limits. No mass or hydronephrosis visualized. Bladder: Appears normal for degree of bladder distention. IMPRESSION: Normal renal ultrasound. Electronically Signed   By: Fidela Salisbury M.D.   On: 07/05/2019 14:12     Scheduled Meds: . enoxaparin (LOVENOX) injection  30 mg Subcutaneous Q24H  . insulin aspart  0-9 Units Subcutaneous TID WC  . insulin aspart  2 Units Subcutaneous TID WC  . pantoprazole (PROTONIX) IV  40 mg Intravenous Q24H   Continuous Infusions: . sodium chloride 125 mL/hr at 07/06/19 0035  . ciprofloxacin 400 mg (07/05/19 1202)  . metronidazole 500 mg (07/06/19 0330)    Principal Problem:   ARF (acute renal failure) (HCC) Active Problems:  Diabetes ([place])   Nicotine addiction   Diarrhea   Non-intractable vomiting with nausea   Type 2 diabetes mellitus without complication (HCC)   Hyponatremia   Essential hypertension   Tobacco abuse   IBS (irritable bowel syndrome)   Type 2 diabetes mellitus ([place])   Abdominal pain   Enteritis   Time spent:   Irwin Brakeman, MD [facility] 07/06/2019, 10:37 AM    LOS: 1 day  How to contact the [facility] Attending or Consulting provider [place] or covering provider during after hours [place], for this patient?  1. Check the care team in [facility] and look for a) attending/consulting [facility] provider listed and b) the [facility] team listed 2. Log into [email or web address] and use [place]'s universal password to access. If you do not have the password, please contact the hospital operator. 3. Locate the [facility] provider you are looking for under  [facility] and page to a number that you can be directly reached. 4. If you still have difficulty reaching the provider, please page the [facility] (Director on Call) for the Hospitalists listed on amion for assistance.

## 2019-07-07 ENCOUNTER — Encounter (HOSPITAL_COMMUNITY): Payer: Self-pay | Admitting: *Deleted

## 2019-07-07 ENCOUNTER — Other Ambulatory Visit: Payer: Self-pay

## 2019-07-07 LAB — RENAL FUNCTION PANEL
Albumin: 3.5 g/dL (ref 3.5–5.0)
Anion gap: 8 (ref 5–15)
BUN: 15 mg/dL (ref 6–20)
CO2: 24 mmol/L (ref 22–32)
Calcium: 8.6 mg/dL — ABNORMAL LOW (ref 8.9–10.3)
Chloride: 106 mmol/L (ref 98–111)
Creatinine, Ser: 1.08 mg/dL — ABNORMAL HIGH (ref 0.44–1.00)
GFR calc Af Amer: >60 mL/min (ref >60)
GFR calc non Af Amer: 58 mL/min — ABNORMAL LOW (ref >60)
Glucose, Bld: 116 mg/dL — ABNORMAL HIGH (ref 70–99)
Phosphorus: 2.8 mg/dL (ref 2.5–4.6)
Potassium: 4.1 mmol/L (ref 3.5–5.1)
Sodium: 138 mmol/L (ref 135–145)

## 2019-07-07 LAB — CBC WITH DIFFERENTIAL/PLATELET
Abs Immature Granulocytes: 0.02 10*3/uL (ref 0.00–0.07)
Basophils Absolute: 0 10*3/uL (ref 0.0–0.1)
Basophils Relative: 0 %
Eosinophils Absolute: 0.1 10*3/uL (ref 0.0–0.5)
Eosinophils Relative: 2 %
HCT: 32.9 % — ABNORMAL LOW (ref 36.0–46.0)
Hemoglobin: 9.7 g/dL — ABNORMAL LOW (ref 12.0–15.0)
Immature Granulocytes: 0 %
Lymphocytes Relative: 36 %
Lymphs Abs: 2 10*3/uL (ref 0.7–4.0)
MCH: 21.1 pg — ABNORMAL LOW (ref 26.0–34.0)
MCHC: 29.5 g/dL — ABNORMAL LOW (ref 30.0–36.0)
MCV: 71.5 fL — ABNORMAL LOW (ref 80.0–100.0)
Monocytes Absolute: 0.5 10*3/uL (ref 0.1–1.0)
Monocytes Relative: 9 %
Neutro Abs: 3 10*3/uL (ref 1.7–7.7)
Neutrophils Relative %: 53 %
Platelets: 184 10*3/uL (ref 150–400)
RBC: 4.6 MIL/uL (ref 3.87–5.11)
RDW: 17.4 % — ABNORMAL HIGH (ref 11.5–15.5)
WBC: 5.7 10*3/uL (ref 4.0–10.5)
nRBC: 0 % (ref 0.0–0.2)

## 2019-07-07 LAB — GLUCOSE, CAPILLARY: Glucose-Capillary: 96 mg/dL (ref 70–99)

## 2019-07-07 LAB — MAGNESIUM: Magnesium: 1.8 mg/dL (ref 1.7–2.4)

## 2019-07-07 MED ORDER — LISINOPRIL 10 MG PO TABS: 10.0000 mg | ORAL_TABLET | Freq: Every day | ORAL | 1 refills | Status: DC

## 2019-07-07 NOTE — Discharge Summary (Signed)
Physician Discharge Summary  Shawna DIZDAREVIC KXF:[id number] DOB: October 22, 1961 DOA: 07/05/2019  PCP: Glenda Chroman, MD  Admit date: 07/05/2019 Discharge date: 07/07/2019  Admitted From:  Home  Disposition: Home   Recommendations for Outpatient Follow-up:  1. Follow up with PCP in 1 weeks 2. Please obtain BMP/CBC in 1 week to follow up electrolytes  Discharge Condition: STABLE   CODE STATUS: FULL    Brief Hospitalization Summary: Please see all hospital notes, images, labs for full details of the hospitalization. HPI: Shawna Huerta is a 58 y.o. female smoker with hypertension, history of IBS, type 2 diabetes mellitus reports that shortly after starting a new medication for her diabetes called Wilder Glade started having severe diarrhea, abdominal pain and symptoms seem to have worsened.  She is having nausea and vomiting and has not been able to eat or drink.  She is having sharp stabbing cramping pains in the lower abdomen.  She denies fever and chills.  She denies blood in stool.  She denies any known sick contacts.  She denies fever and chills.  She presented to the emergency department because overnight her abdominal pain seemed to be worsening.  ED course: The patient had a CT of the abdomen and pelvis with findings of fluid-filled small bowel loops and possible enteritis versus early ileus.  Sodium 136, potassium 3.7, BUN 21, creatinine 3.07, lipase 103.  White blood cell count 8.9, hemoglobin 10.9, platelet count 216.  The patient continued to have diarrhea.  With the acute kidney injury the patient was called for hospital admission.  Brief Admission Hx: 58 y.o.femalesmokerwithhypertension, history of IBS, type 2 diabetes mellitus reports that shortly after startinganew medication for her diabetes called Wilder Glade started having severe diarrhea, abdominal pain and symptoms seem to have worsened.  MDM/Assessment & Plan:   1. Acute kidney injury-improving as creatinine trended down to 1.0.  This  injury is due to prerenal azotemia as she was severely dehydrated. She did have a normal creatinine in 2017. She had been taking lisinopril which was temporarily discontinued but can be restarted now at lower dose. Pt was treated with IV fluid hydration. Follow renal function panel outpatient with PCP. Ultrasound of the kidneys is negative for significant finding or abnormality. 2. Enteritis-pt reports her diarrhea has resolved. The patient was empirically started on Cipro and Flagyl and tx for 3 days.  3. Abdominal pain-resolved now, suspect patient'ssymptoms secondary to enteritis.  4. Essential hypertension-blood pressures are improved with hydration. resume lisinopril but reduce dose by 50%.  5. Tobacco-Offered nicotine patch to use while in the hospital for cravings. 6. Type 2 diabetes mellitus-poorly controlled as evidenced by A1c 8.6%.  Did not restart Wilder Glade as this seemed to precipitate her symptoms and admission.  Follow up and discuss with PCP.     DVT Prophylaxis:Lovenox Code Status:Full Family Communication:Patient updated at bedside Disposition Plan:Home   Discharge Diagnoses:  Principal Problem:   ARF (acute renal failure) (HCC) Active Problems:   Diabetes (HCC)   Nicotine addiction   Diarrhea   Non-intractable vomiting with nausea   Type 2 diabetes mellitus without complication (HCC)   Hyponatremia   Essential hypertension   Tobacco abuse   IBS (irritable bowel syndrome)   Type 2 diabetes mellitus (HCC)   Abdominal pain   Enteritis   Discharge Instructions: Discharge Instructions    Call MD for:  difficulty breathing, headache or visual disturbances   Complete by: As directed    Call MD for:  extreme fatigue  Complete by: As directed    Call MD for:  persistant dizziness or light-headedness   Complete by: As directed    Call MD for:  persistant nausea and vomiting   Complete by: As directed    Call MD for:  severe uncontrolled pain   Complete  by: As directed    Increase activity slowly   Complete by: As directed      Allergies as of 07/07/2019      Reactions   Iohexol Swelling    Code: HIVES, Desc: PT STATES THE EYES SWOLL AND ITCHED, NEEDS PRE MEDS      Medication List    STOP taking these medications   Farxiga 5 MG Tabs tablet Generic drug: dapagliflozin propanediol     TAKE these medications   glyBURIDE-metformin 5-500 MG tablet Commonly known as: GLUCOVANCE Take 1 tablet by mouth 3 (three) times daily.   lisinopril 10 MG tablet Commonly known as: ZESTRIL Take 1 tablet (10 mg total) by mouth daily. Start taking on: July 10, 2019 What changed:   how much to take  These instructions start on July 10, 2019. If you are unsure what to do until then, ask your doctor or other care provider.      Follow-up Information    Vyas, Dhruv B, MD. Schedule an appointment as soon as possible for a visit in 1 week(s).   Specialty: Internal Medicine Why: Hospital Follow Up  Contact information: [address] [place] [address] [phone]          Allergies  Allergen Reactions  . Iohexol Swelling     Code: HIVES, Desc: PT STATES THE EYES SWOLL AND ITCHED, NEEDS PRE MEDS    Allergies as of 07/07/2019      Reactions   Iohexol Swelling    Code: HIVES, Desc: PT STATES THE EYES SWOLL AND ITCHED, NEEDS PRE MEDS      Medication List    STOP taking these medications   Farxiga 5 MG Tabs tablet Generic drug: dapagliflozin propanediol     TAKE these medications   glyBURIDE-metformin 5-500 MG tablet Commonly known as: GLUCOVANCE Take 1 tablet by mouth 3 (three) times daily.   lisinopril 10 MG tablet Commonly known as: ZESTRIL Take 1 tablet (10 mg total) by mouth daily. Start taking on: July 10, 2019 What changed:   how much to take  These instructions start on July 10, 2019. If you are unsure what to do until then, ask your doctor or other care provider.       Procedures/Studies: Ct Abdomen  Pelvis Wo Contrast  Result Date: 07/05/2019 CLINICAL DATA:  Lower abdominal pain with nausea and vomiting EXAM: CT ABDOMEN AND PELVIS WITHOUT CONTRAST TECHNIQUE: Multidetector CT imaging of the abdomen and pelvis was performed following the standard protocol without IV contrast. Oral contrast was administered. COMPARISON:  October 23, 2016 FINDINGS: Lower chest: There are scattered foci of bibasilar atelectasis. There is no lung base edema or consolidation. Hepatobiliary: Liver measures 19.1 cm in length. There is hepatic steatosis. No focal liver lesions are evident on this noncontrast enhanced study. Gallbladder is absent. There is no appreciable biliary duct dilatation. Pancreas: No pancreatic mass or inflammatory focus. Spleen: No splenicw lesions are evident. Adrenals/Urinary Tract: Adrenals bilaterally appear normal. Kidneys bilaterally show no evident mass or hydronephrosis. There is no evident renal or ureteral calculus on either side. Urinary bladder is midline with wall thickness within normal limits. Stomach/Bowel: There are scattered colonic diverticula without demonstrable diverticulitis.  There is no appreciable bowel wall thickening. Most small bowel loops are fluid filled. There is no demonstrable bowel obstruction. Terminal ileum appears normal. There is no evident free air or portal venous air. Vascular/Lymphatic: w no abdominal aortic aneurysm. No vascular lesions are appreciable on this noncontrast enhanced study. There is no appreciable adenopathy in the abdomen or pelvis by size criteria. There are subcentimeter common femoral and inguinal lymph nodes which are considered nonspecific. Reproductive: Uterus is absent. No pelvic mass evident. A surgical clip is noted in the right pelvis. Other: Appendix appears normal. No abscess or ascites evident in the abdomen or pelvis. Musculoskeletal: There is degenerative change in the lumbar spine. There are no blastic or lytic bone lesions. There is a  degree of sacroiliitis bilaterally, stable. No intramuscular or abdominal wall lesions evident. IMPRESSION: 1. Most small bowel loops are fluid filled. While this finding may be seen normally, it may be indicative of a degree of underlying enteritis or early ileus. No bowel obstruction. There are scattered colonic diverticula without diverticulitis evident. 2.  Appendix appears normal.  No abscess in the abdomen or pelvis. 3. No renal or ureteral calculi. No hydronephrosis. Urinary bladder wall thickness is within normal limits. 4.  Gallbladder absent.  Uterus absent. 5.  Stable changes of a degree of sacroiliitis bilaterally. Electronically Signed   By: Lowella Grip III M.D.   On: 07/05/2019 08:09   US Renal  Result Date: 07/05/2019 CLINICAL DATA:  Acute renal insufficiency. EXAM: RENAL / URINARY TRACT ULTRASOUND COMPLETE COMPARISON:  CT of the abdomen from the same date FINDINGS: Right Kidney: Renal measurements: 11.5 x 5.1 x 8.2 cm = volume: 254 mL . Echogenicity within normal limits. No mass or hydronephrosis visualized. Left Kidney: Renal measurements: 12.2 x 7.3 x 5.0 cm = volume: 235 mL. Echogenicity within normal limits. No mass or hydronephrosis visualized. Bladder: Appears normal for degree of bladder distention. IMPRESSION: Normal renal ultrasound. Electronically Signed   By: Fidela Salisbury M.D.   On: 07/05/2019 14:12      Subjective: Pt says she feels better, she is eating and drinking well and diarrhea has resolved, no abdominal pain.  No fever.  Wants to go home.   Discharge Exam: Vitals:   07/06/19 2122 07/07/19 0552  BP:  130/67  Pulse:  73  Resp: 15   Temp:  98.5 F (36.9 C)  SpO2:  100%   Vitals:   07/06/19 2122 07/07/19 0500 07/07/19 0552 07/07/19 0750  BP: (!) 119/56  130/67   Pulse: 71  73   Resp: 15     Temp: 98.4 F (36.9 C)  98.5 F (36.9 C)   TempSrc: Oral  Oral   SpO2: 97%  100%   Weight:  122.6 kg  62.8 kg  Height:    5\' 2"  (1.575 m)   General: Pt  is alert, awake, not in acute distress Cardiovascular: RRR, S1/S2 +, no rubs, no gallops Respiratory: CTA bilaterally, no wheezing, no rhonchi Abdominal: Soft, NT, ND, bowel sounds + Extremities: no edema, no cyanosis   The results of significant diagnostics from this hospitalization (including imaging, microbiology, ancillary and laboratory) are listed below for reference.     Microbiology: Recent Results (from the past 240 hour(s))  SARS Coronavirus 2 [facility] order, Performed in [facility] hospital lab) Nasopharyngeal Nasopharyngeal Swab     Status: None   Collection Time: 07/05/19  8:22 AM   Specimen: Nasopharyngeal Swab  Result Value Ref Range Status   SARS  Coronavirus 2 NEGATIVE NEGATIVE Final    Comment: (NOTE) If result is NEGATIVE SARS-CoV-2 target nucleic acids are NOT DETECTED. The SARS-CoV-2 RNA is generally detectable in upper and lower  respiratory specimens during the acute phase of infection. The lowest  concentration of SARS-CoV-2 viral copies this assay can detect is 250  copies / mL. A negative result does not preclude SARS-CoV-2 infection  and should not be used as the sole basis for treatment or other  patient management decisions.  A negative result may occur with  improper specimen collection / handling, submission of specimen other  than nasopharyngeal swab, presence of viral mutation(s) within the  areas targeted by this assay, and inadequate number of viral copies  (<250 copies / mL). A negative result must be combined with clinical  observations, patient history, and epidemiological information. If result is POSITIVE SARS-CoV-2 target nucleic acids are DETECTED. The SARS-CoV-2 RNA is generally detectable in upper and lower  respiratory specimens dur ing the acute phase of infection.  Positive  results are indicative of active infection with SARS-CoV-2.  Clinical  correlation with patient history and other diagnostic information is  necessary to  determine patient infection status.  Positive results do  not rule out bacterial infection or co-infection with other viruses. If result is PRESUMPTIVE POSTIVE SARS-CoV-2 nucleic acids MAY BE PRESENT.   A presumptive positive result was obtained on the submitted specimen  and confirmed on repeat testing.  While 2019 novel coronavirus  (SARS-CoV-2) nucleic acids may be present in the submitted sample  additional confirmatory testing may be necessary for epidemiological  and / or clinical management purposes  to differentiate between  SARS-CoV-2 and other Sarbecovirus currently known to infect humans.  If clinically indicated additional testing with an alternate test  methodology [phone]) is advised. The SARS-CoV-2 RNA is generally  detectable in upper and lower respiratory sp ecimens during the acute  phase of infection. The expected result is Negative. Fact Sheet for Patients:  StrictlyIdeas.no Fact Sheet for Healthcare Providers: BankingDealers.co.za This test is not yet approved or cleared by the [place] FDA and has been authorized for detection and/or diagnosis of SARS-CoV-2 by FDA under an Emergency Use Authorization (EUA).  This EUA will remain in effect (meaning this test can be used) for the duration of the COVID-19 declaration under Section 564(b)(1) of the Act, 21 U.S.C. section 360bbb-3(b)(1), unless the authorization is terminated or revoked sooner. Performed at [facility], [address] [place], [place] [address]      Labs: BNP (last 3 results) No results for input(s): BNP in the last 8760 hours. Basic Metabolic Panel: Recent Labs  Lab 07/05/19 0607 07/06/19 0558 07/07/19 0618  NA 136 139 138  K 3.7 3.8 4.1  CL 101 105 106  CO2 24 25 24   GLUCOSE 111* 86 116*  BUN 21* 23* 15  CREATININE 3.07* 2.08* 1.08*  CALCIUM 8.8* 8.5* 8.6*  MG  --  1.8 1.8  PHOS  --  3.4 2.8   Liver Function Tests: Recent Labs   Lab 07/05/19 0607 07/06/19 0558 07/07/19 0618  AST 27  --   --   ALT 24  --   --   ALKPHOS 52  --   --   BILITOT 0.5  --   --   PROT 8.2*  --   --   ALBUMIN 3.9 3.3* 3.5   Recent Labs  Lab 07/05/19 0607 07/06/19 0745  LIPASE 103* 33   No results for input(s):  AMMONIA in the last 168 hours. CBC: Recent Labs  Lab 07/05/19 0607 07/06/19 0558 07/07/19 0618  WBC 8.9 6.1 5.7  NEUTROABS 5.5 3.3 3.0  HGB 10.9* 9.5* 9.7*  HCT 35.8* 32.3* 32.9*  MCV 69.6* 71.8* 71.5*  PLT 216 202 184   Cardiac Enzymes: No results for input(s): CKTOTAL, CKMB, CKMBINDEX, TROPONINI in the last 168 hours. BNP: Invalid input(s): POCBNP CBG: Recent Labs  Lab 07/06/19 1006 07/06/19 1609 07/06/19 2122 07/07/19 0748  GLUCAP 129* 119* 133* 96   D-Dimer No results for input(s): DDIMER in the last 72 hours. Hgb A1c Recent Labs    07/05/19 1126  HGBA1C 8.3*   Lipid Profile No results for input(s): CHOL, HDL, LDLCALC, TRIG, CHOLHDL, LDLDIRECT in the last 72 hours. Thyroid function studies Recent Labs    07/05/19 1127  TSH 1.504   Anemia work up No results for input(s): VITAMINB12, FOLATE, FERRITIN, TIBC, IRON, RETICCTPCT in the last 72 hours. Urinalysis    Component Value Date/Time   COLORURINE AMBER (A) 07/05/2019 0607   APPEARANCEUR CLOUDY (A) 07/05/2019 0607   LABSPEC 1.028 07/05/2019 0607   PHURINE 5.0 07/05/2019 0607   GLUCOSEU 50 (A) 07/05/2019 0607   HGBUR NEGATIVE 07/05/2019 0607   BILIRUBINUR MODERATE (A) 07/05/2019 0607   KETONESUR NEGATIVE 07/05/2019 0607   PROTEINUR >=300 (A) 07/05/2019 0607   UROBILINOGEN 0.2 10/10/2015 1446   NITRITE NEGATIVE 07/05/2019 0607   LEUKOCYTESUR NEGATIVE 07/05/2019 0607   Sepsis Labs Invalid input(s): PROCALCITONIN,  WBC,  LACTICIDVEN Microbiology Recent Results (from the past 240 hour(s))  SARS Coronavirus 2 [facility] order, Performed in [facility] hospital lab) Nasopharyngeal Nasopharyngeal Swab     Status: None   Collection  Time: 07/05/19  8:22 AM   Specimen: Nasopharyngeal Swab  Result Value Ref Range Status   SARS Coronavirus 2 NEGATIVE NEGATIVE Final    Comment: (NOTE) If result is NEGATIVE SARS-CoV-2 target nucleic acids are NOT DETECTED. The SARS-CoV-2 RNA is generally detectable in upper and lower  respiratory specimens during the acute phase of infection. The lowest  concentration of SARS-CoV-2 viral copies this assay can detect is 250  copies / mL. A negative result does not preclude SARS-CoV-2 infection  and should not be used as the sole basis for treatment or other  patient management decisions.  A negative result may occur with  improper specimen collection / handling, submission of specimen other  than nasopharyngeal swab, presence of viral mutation(s) within the  areas targeted by this assay, and inadequate number of viral copies  (<250 copies / mL). A negative result must be combined with clinical  observations, patient history, and epidemiological information. If result is POSITIVE SARS-CoV-2 target nucleic acids are DETECTED. The SARS-CoV-2 RNA is generally detectable in upper and lower  respiratory specimens dur ing the acute phase of infection.  Positive  results are indicative of active infection with SARS-CoV-2.  Clinical  correlation with patient history and other diagnostic information is  necessary to determine patient infection status.  Positive results do  not rule out bacterial infection or co-infection with other viruses. If result is PRESUMPTIVE POSTIVE SARS-CoV-2 nucleic acids MAY BE PRESENT.   A presumptive positive result was obtained on the submitted specimen  and confirmed on repeat testing.  While 2019 novel coronavirus  (SARS-CoV-2) nucleic acids may be present in the submitted sample  additional confirmatory testing may be necessary for epidemiological  and / or clinical management purposes  to differentiate between  SARS-CoV-2 and other Sarbecovirus currently  known  to infect humans.  If clinically indicated additional testing with an alternate test  methodology [phone]) is advised. The SARS-CoV-2 RNA is generally  detectable in upper and lower respiratory sp ecimens during the acute  phase of infection. The expected result is Negative. Fact Sheet for Patients:  StrictlyIdeas.no Fact Sheet for Healthcare Providers: BankingDealers.co.za This test is not yet approved or cleared by the [place] FDA and has been authorized for detection and/or diagnosis of SARS-CoV-2 by FDA under an Emergency Use Authorization (EUA).  This EUA will remain in effect (meaning this test can be used) for the duration of the COVID-19 declaration under Section 564(b)(1) of the Act, 21 U.S.C. section 360bbb-3(b)(1), unless the authorization is terminated or revoked sooner. Performed at [facility], [address] [place],  [address]    Time coordinating discharge:   SIGNED:  Irwin Brakeman, MD  [facility] 07/07/2019, 10:11 AM How to contact the [facility] Attending or Consulting provider [place] or covering provider during after hours [place], for this patient?  1. Check the care team in [facility] and look for a) attending/consulting [facility] provider listed and b) the [facility] team listed 2. Log into [email or web address] and use [facility]'s universal password to access. If you do not have the password, please contact the hospital operator. 3. Locate the [facility] provider you are looking for under [facility] and page to a number that you can be directly reached. 4. If you still have difficulty reaching the provider, please page the [facility] (Director on Call) for the Hospitalists listed on amion for assistance.

## 2019-07-07 NOTE — Progress Notes (Signed)
Nsg Discharge Note  Admit Date:  07/05/2019 Discharge date: 07/07/2019   Travis Deneen Harts to be D/C'd Home per MD order.  AVS completed.  Copy for chart, and copy for patient signed, and dated. Patient/caregiver able to verbalize understanding.  Discharge Medication: Allergies as of 07/07/2019      Reactions   Iohexol Swelling    Code: HIVES, Desc: PT STATES THE EYES SWOLL AND ITCHED, NEEDS PRE MEDS      Medication List    STOP taking these medications   Farxiga 5 MG Tabs tablet Generic drug: dapagliflozin propanediol     TAKE these medications   glyBURIDE-metformin 5-500 MG tablet Commonly known as: GLUCOVANCE Take 1 tablet by mouth 3 (three) times daily.   lisinopril 10 MG tablet Commonly known as: ZESTRIL Take 1 tablet (10 mg total) by mouth daily. Start taking on: July 10, 2019 What changed:   how much to take  These instructions start on July 10, 2019. If you are unsure what to do until then, ask your doctor or other care provider.       Discharge Assessment: Vitals:   07/06/19 2122 07/07/19 0552  BP:  130/67  Pulse:  73  Resp: 15   Temp:  98.5 F (36.9 C)  SpO2:  100%   Skin clean, dry and intact without evidence of skin break down, no evidence of skin tears noted. IV catheter discontinued intact. Site without signs and symptoms of complications - no redness or edema noted at insertion site, patient denies c/o pain - only slight tenderness at site.  Dressing with slight pressure applied.  D/c Instructions-Education: Discharge instructions given to patient/family with verbalized understanding. D/c education completed with patient/family including follow up instructions, medication list, d/c activities limitations if indicated, with other d/c instructions as indicated by MD - patient able to verbalize understanding, all questions fully answered. Patient instructed to return to ED, call 911, or call MD for any changes in condition.  Patient escorted via [place], and  D/C home via private auto.  Loa Socks, RN 07/07/2019 10:32 AM

## 2019-07-07 NOTE — Discharge Instructions (Signed)
Acute Kidney Injury, Adult ° °Acute kidney injury is a sudden worsening of kidney function. The kidneys are organs that have several jobs. They filter the blood to remove waste products and extra fluid. They also maintain a healthy balance of minerals and hormones in the body, which helps control blood pressure and keep bones strong. With this condition, your kidneys do not do their jobs as well as they should. °This condition ranges from mild to severe. Over time it may develop into long-lasting (chronic) kidney disease. Early detection and treatment may prevent acute kidney injury from developing into a chronic condition. °What are the causes? °Common causes of this condition include: °· A problem with blood flow to the kidneys. This may be caused by: °? Low blood pressure (hypotension) or shock. °? Blood loss. °? Heart and blood vessel (cardiovascular) disease. °? Severe burns. °? Liver disease. °· Direct damage to the kidneys. This may be caused by: °? Certain medicines. °? A kidney infection. °? Poisoning. °? Being around or in contact with toxic substances. °? A surgical wound. °? A hard, direct hit to the kidney area. °· A sudden blockage of urine flow. This may be caused by: °? Cancer. °? Kidney stones. °? An enlarged prostate in males. °What are the signs or symptoms? °Symptoms of this condition may not be obvious until the condition becomes severe. Symptoms of this condition can include: °· Tiredness (lethargy), or difficulty staying awake. °· Nausea or vomiting. °· Swelling (edema) of the face, legs, ankles, or feet. °· Problems with urination, such as: °? Abdominal pain, or pain along the side of your stomach (flank). °? Decreased urine production. °? Decrease in the force of urine flow. °· Muscle twitches and cramps, especially in the legs. °· Confusion or trouble concentrating. °· Loss of appetite. °· Fever. °How is this diagnosed? °This condition may be diagnosed with tests, including: °· Blood  tests. °· Urine tests. °· Imaging tests. °· A test in which a sample of tissue is removed from the kidneys to be examined under a microscope (kidney biopsy). °How is this treated? °Treatment for this condition depends on the cause and how severe the condition is. In mild cases, treatment may not be needed. The kidneys may heal on their own. In more severe cases, treatment will involve: °· Treating the cause of the kidney injury. This may involve changing any medicines you are taking or adjusting your dosage. °· Fluids. You may need specialized IV fluids to balance your body's needs. °· Having a catheter placed to drain urine and prevent blockages. °· Preventing problems from occurring. This may mean avoiding certain medicines or procedures that can cause further injury to the kidneys. °In some cases treatment may also require: °· A procedure to remove toxic wastes from the body (dialysis or continuous renal replacement therapy - CRRT). °· Surgery. This may be done to repair a torn kidney, or to remove the blockage from the urinary system. °Follow these instructions at home: °Medicines °· Take over-the-counter and prescription medicines only as told by your health care provider. °· Do not take any new medicines without your health care provider's approval. Many medicines can worsen your kidney damage. °· Do not take any vitamin and mineral supplements without your health care provider's approval. Many nutritional supplements can worsen your kidney damage. °Lifestyle °· If your health care provider prescribed changes to your diet, follow them. You may need to decrease the amount of protein you eat. °· Achieve and maintain a healthy   weight. If you need help with this, ask your health care provider.  Start or continue an exercise plan. Try to exercise at least 30 minutes a day, 5 days a week.  Do not use any tobacco products, such as cigarettes, chewing tobacco, and e-cigarettes. If you need help quitting, ask your  health care provider. General instructions  Keep track of your blood pressure. Report changes in your blood pressure as told by your health care provider.  Stay up to date with immunizations. Ask your health care provider which immunizations you need.  Keep all follow-up visits as told by your health care provider. This is important. Where to find more information  American Association of Kidney Patients: BombTimer.gl  National Kidney Foundation: www.kidney.[place]: https://mathis.com/  Life Options [facility]: ? www.lifeoptions.org ? www.kidneyschool.org Contact a health care provider if:  Your symptoms get worse.  You develop new symptoms. Get help right away if:  You develop symptoms of worsening kidney disease, which include: ? Headaches. ? Abnormally dark or light skin. ? Easy bruising. ? Frequent hiccups. ? Chest pain. ? Shortness of breath. ? End of menstruation in women. ? Seizures. ? Confusion or altered mental status. ? Abdominal or back pain. ? Itchiness.  You have a fever.  Your body is producing less urine.  You have pain or bleeding when you urinate. Summary  Acute kidney injury is a sudden worsening of kidney function.  Acute kidney injury can be caused by problems with blood flow to the kidneys, direct damage to the kidneys, and sudden blockage of urine flow.  Symptoms of this condition may not be obvious until it becomes severe. Symptoms may include edema, lethargy, confusion, nausea or vomiting, and problems passing urine.  This condition can usually be diagnosed with blood tests, urine tests, and imaging tests. Sometimes a kidney biopsy is done to diagnose this condition.  Treatment for this condition often involves treating the underlying cause. It is treated with fluids, medicines, dialysis, diet changes, or surgery. This information is not intended to replace advice given to you by your health care provider. Make  sure you discuss any questions you have with your health care provider. Document Released: 05/30/2011 Document Revised: 10/27/2017 Document Reviewed: 11/04/2016 Elsevier Patient Education  2020 [place].   IMPORTANT INFORMATION: PAY CLOSE ATTENTION   PHYSICIAN DISCHARGE INSTRUCTIONS  Follow with Primary care provider  Glenda Chroman, MD  and other consultants as instructed by your Hospitalist Physician  [place] IF SYMPTOMS COME BACK, WORSEN OR NEW PROBLEM DEVELOPS   Please note: You were cared for by a hospitalist during your hospital stay. Every effort will be made to forward records to your primary care provider.  You can request that your primary care provider send for your hospital records if they have not received them.  Once you are discharged, your primary care physician will handle any further medical issues. Please note that NO REFILLS for any discharge medications will be authorized once you are discharged, as it is imperative that you return to your primary care physician (or establish a relationship with a primary care physician if you do not have one) for your post hospital discharge needs so that they can reassess your need for medications and monitor your lab values.  Please get a complete blood count and chemistry panel checked by your Primary MD at your next visit, and again as instructed by your Primary MD.  Get Medicines reviewed  and adjusted: Please take all your medications with you for your next visit with your Primary MD  Laboratory/radiological data: Please request your Primary MD to go over all hospital tests and procedure/radiological results at the follow up, please ask your primary care provider to get all Hospital records sent to his/her office.  In some cases, they will be blood work, cultures and biopsy results pending at the time of your discharge. Please request that your primary care provider follow up on these  results.  If you are diabetic, please bring your blood sugar readings with you to your follow up appointment with primary care.    Please call and make your follow up appointments as soon as possible.    Also Note the following: If you experience worsening of your admission symptoms, develop shortness of breath, life threatening emergency, suicidal or homicidal thoughts you must seek medical attention immediately by calling 911 or calling your MD immediately  if symptoms less severe.  You must read complete instructions/literature along with all the possible adverse reactions/side effects for all the Medicines you take and that have been prescribed to you. Take any new Medicines after you have completely understood and accpet all the possible adverse reactions/side effects.   Do not drive when taking Pain medications or sleeping medications (Benzodiazepines)  Do not take more than prescribed Pain, Sleep and Anxiety Medications. It is not advisable to combine anxiety,sleep and pain medications without talking with your primary care practitioner  Special Instructions: If you have smoked or chewed Tobacco  in the last 2 yrs please stop smoking, stop any regular Alcohol  and or any Recreational drug use.  Wear Seat belts while driving.  Do not drive if taking any narcotic, mind altering or controlled substances or recreational drugs or alcohol.

## 2019-10-08 ENCOUNTER — Other Ambulatory Visit: Payer: Self-pay

## 2019-10-08 ENCOUNTER — Encounter (HOSPITAL_COMMUNITY): Payer: Self-pay | Admitting: Physical Therapy

## 2019-10-08 ENCOUNTER — Ambulatory Visit (HOSPITAL_COMMUNITY): Payer: BC Managed Care – PPO | Attending: Student | Admitting: Physical Therapy

## 2019-10-08 DIAGNOSIS — M25571 Pain in right ankle and joints of right foot: Secondary | ICD-10-CM | POA: Diagnosis not present

## 2019-10-08 DIAGNOSIS — M25671 Stiffness of right ankle, not elsewhere classified: Secondary | ICD-10-CM | POA: Diagnosis present

## 2019-10-08 NOTE — Patient Instructions (Addendum)
ROM: Plantar / Dorsiflexion    With right leg relaxed, gently flex and extend ankle. Move through full range of motion. Avoid pain. Repeat _10___ times per set. Do _2___ sets per session. Do __2__ sessions per day.  http://orth.exer.us/34   Copyright  VHI. All rights reserved.  ROM: Inversion / Eversion    With right leg relaxed, gently turn ankle and foot in and out. Move through full range of motion. Avoid pain. Repeat __10__ times per set. Do ___1_ sets per session. Do _2___ sessions per day.  http://orth.exer.us/36   Copyright  VHI. All rights reserved.  Ankle Alphabet    Using right  ankle and foot only, trace the letters of the alphabet. Perform A to Z. Repeat __1__ times per set. Do _1___ sets per session. Do _1___ sessions per day.  http://orth.exer.us/16   Copyright  VHI. All rights reserved.   Wean out of your brace.

## 2019-10-08 NOTE — Therapy (Signed)
[place], [place], [address] Phone: [phone]   Fax:  [phone]  Physical Therapy Evaluation  Patient Details  Name: Shawna Huerta MRN: [id number] Date of Birth: 12/07/1960 Referring Provider (PT): Mechele Claude    Encounter Date: 10/08/2019  PT End of Session - 10/08/19 1417    Visit Number  1    Number of Visits  8    Date for PT Re-Evaluation  11/07/19    Authorization Type  BCBS    Authorization - Visit Number  1    Authorization - Number of Visits  60    PT Start Time  [id number]    PT Stop Time  1125    PT Time Calculation (min)  40 min       Past Medical History:  Diagnosis Date  . Anemia   . Arthritis   . Bartholin cyst 07/03/2014  . Colitis 08/2015   Treated at [facility]  . Diabetes mellitus   . Hypertension   . Labial abscess 08/2015   Status post I&D by Dr. Ladona Horns  . Nicotine addiction 09/03/2013  . Obesity   . Pancreatitis 09/2015   unknown etiology, no further work-up with gastroenterology   . Vaginal itching 09/03/2013    Past Surgical History:  Procedure Laterality Date  . ABDOMINAL HYSTERECTOMY    . ABDOMINAL SURGERY     exploratry with TAH  . BALLOON DILATION N/A 10/26/2016   Procedure: Pyloric channel BALLOON DILATION;  Surgeon: Daneil Dolin, MD;  Location: [facility];  Service: Endoscopy;  Laterality: N/A;  . BARTHOLIN GLAND CYST EXCISION Right 08/20/2014   Procedure: EXCISION OF RIGHT BARTHOLIN'S GLAND CYST;  Surgeon: Florian Buff, MD;  Location: [facility] ORS;  Service: Gynecology;  Laterality: Right;  . CHOLECYSTECTOMY    . COLONOSCOPY  10/2015   [facility] [place]: mild diverticulosis, at least 8 polpys removed, multiple tubular adenomas. next tcs in 1 year  . ESOPHAGOGASTRODUODENOSCOPY N/A 10/26/2016   Procedure: ESOPHAGOGASTRODUODENOSCOPY (EGD);  Surgeon: Daneil Dolin, MD;  Location: [facility];  Service: Endoscopy;  Laterality: N/A;  . FOOT SURGERY    . FOOT  SURGERY Right 2011   shave bone  . TONSILLECTOMY      There were no vitals filed for this visit.   Subjective Assessment - 10/08/19 1059    Subjective  Ms.  Bobian states that she had surgery on her rt ankle about 12 years ago due to chronic pain.  The surgery was to remove an extra bone and clean up bone spurs.  The surgery was   successfull and she was pain free until about two months when her pain returned for no apparent reason.  She was referred to Dr. Alvan Dame who wants to see if she strengthens her ankle if they can avoid surgery. Prior to seeing Dr. Alvan Dame she was placed in a CAM boot for four weeks by Dr. Caprice Beaver.  She just came out ot the Cam boot on 10/03/2019.    Pertinent History  HTN, DM    Limitations  Lifting;Standing;Walking;House hold activities    How long can you sit comfortably?  no problem    How long can you stand comfortably?  shifts wt after about 15 minutes    How long can you walk comfortably?  about 15 minutes    Patient Stated Goals  less pain, stand and walk longer without pain,    Currently in Pain?  Yes  pt is sitting; highest pain 4/10; lowest 0/10   Pain Score  1     Pain Location  Ankle    Pain Orientation  Right;Medial;Lateral    Pain Descriptors / Indicators  Shooting;Sharp;Aching;Throbbing    Pain Type  Acute pain    Pain Onset  More than a month ago    Pain Frequency  Intermittent    Aggravating Factors   any weight bearing activity.    Pain Relieving Factors  medication,    Effect of Pain on Daily Activities  limits         [facility] PT Assessment - 10/08/19 0001      Assessment   Medical Diagnosis  Rt posterior tibialis tendonitis     Referring Provider (PT)  Mechele Claude     Onset Date/Surgical Date  08/13/19    Next MD Visit  11/15/2019      Precautions   Precautions  None      Restrictions   Weight Bearing Restrictions  No      Balance Screen   Has the patient fallen in the past 6 months  No    Has the patient had a decrease in activity  level because of a fear of falling?   Yes    Is the patient reluctant to leave their home because of a fear of falling?   No      Home Film/video editor residence    Home Access  Ramped entrance      Prior Function   Level of [place]  Full time employment    Vocation Requirements  10-12 hrs of standing , pushing , pullling, lifting up to 25# various heights     Leisure  reading       Cognition   Overall Cognitive Status  Within Functional Limits for tasks assessed      Observation/Other Assessments   Focus on Therapeutic Outcomes (FOTO)   48      Functional Tests   Functional tests  Sit to Stand;Single leg stance      Single Leg Stance   Comments  LT 60";  Rt 16"    With ankle brace on      Sit to Stand   Comments  10 reps in 30 seconds pain increased to 3/10       ROM / Strength   AROM / PROM / Strength  AROM;Strength      AROM   AROM Assessment Site  Ankle    Right/Left Ankle  Right;Left    Right Ankle Dorsiflexion  0    Right Ankle Plantar Flexion  45    Right Ankle Inversion  12    Right Ankle Eversion  8    Left Ankle Dorsiflexion  5    Left Ankle Plantar Flexion  55    Left Ankle Inversion  30    Left Ankle Eversion  15      Strength   Strength Assessment Site  Ankle    Right/Left Ankle  Right;Left    Right Ankle Dorsiflexion  4/5    Right Ankle Plantar Flexion  4/5    Right Ankle Inversion  4/5    Right Ankle Eversion  4/5                Objective measurements completed on examination: See above findings.      [place] Adult PT Treatment/Exercise - 10/08/19 0001  Exercises   Exercises  Ankle      Ankle Exercises: Seated   ABC's  1 rep      Ankle Exercises: Supine   Other Supine Ankle Exercises  ROM for all x 10              PT Education - 10/08/19 1414    Education Details  HEP, how to wean off of ankle support    Person(s) Educated  Patient    Methods  Explanation     Comprehension  Verbalized understanding       PT Short Term Goals - 10/08/19 1433      PT SHORT TERM GOAL #1   Title  PT Rt ankle ROM to be wnl to allow pt to have a normalized gait.    Time  2    Period  Weeks    Status  New    [place] Date  10/22/19      PT SHORT TERM GOAL #2   Title  Pt pain in her RT ankle to be no greater than a 2/10 to allow pt to sand/walk for 28minutes to allow pt to complete shopping, and household cleaning without having to take breakes.    Time  2    Period  Weeks    Status  New      PT SHORT TERM GOAL #3   Title  Pt to no longer be wearing an ankle brace on her Rt LE for support    Time  2    Period  Weeks    Status  New      PT SHORT TERM GOAL #4   Title  PT to be able to single leg stance on her right LE for at least 30 seconds to reduce risk of falling.    Time  2    Period  Weeks        PT Long Term Goals - 10/08/19 1443      PT LONG TERM GOAL #1   Title  Pt Rt ankle pain to be no greater than a 1/10 to allow pt to be able to stand/walk for two hours at a time to prepare to return to work.    Time  4    Period  Weeks    Status  New    Target Date  11/05/19      PT LONG TERM GOAL #2   Title  Pt to be able to single leg stance for 60 seconds on her rt LE to allow her to feel confident walking on uneven terrain.    Time  4    Period  Weeks    Status  New      PT LONG TERM GOAL #3   Title  PT Rt ankle strength to be 5/5 to allow pt to go up and down a flight of steps with no discomfort.    Time  4    Period  Weeks    Status  New             Plan - 10/08/19 1418    Clinical Impression Statement  Ms. Wolper is a 58 yo who had chronic ankle pain 12 yrs ago, eventually they did surgery and removed an extra bone that she had in her foot as well as shaved down some bone spurs.  Her pain completely resolved until a couple of months ago.  There was no injury or trauma her ankle just began to have  increased pain with weightbearing.   She was put in a CAM boot for four weeks without relief therefore she was referred to Dr. Alvan Dame who has referred her to skilled physical therapy to try therapy prior to any other options.  At this time she is on medical leave but will be required to stand for 10-12 hours.  Evaluation demonstrates decreased ROM, decreased strength, decreased balance and increased pain.  Ms. Uhlmann will benefit from skilled physical therapy to address these issues and maximize her functional abiltiy .    Personal Factors and Comorbidities  Past/Current Experience    Examination-Activity Limitations  Carry;Locomotion Level;Stand    Examination-Participation Restrictions  Yard Work;Community Activity;Cleaning    Stability/Clinical Decision Making  Stable/Uncomplicated    Clinical Decision Making  Moderate    Rehab Potential  Good    PT Frequency  2x / week    PT Duration  4 weeks    PT Treatment/Interventions  Gait training;Stair training;Functional mobility training;Therapeutic activities;Therapeutic exercise;Balance training;Manual techniques;Patient/family education;Passive range of motion    PT Next Visit Plan  Begin slant board stretch, heel raises with slow lowering.  T band exercises, baps standing or sitting, single leg stance, rocker board, tandem stance progress to foam if able.    PT Home Exercise Plan  RoM, Ankle alphabet exercises    Consulted and Agree with Plan of Care  Patient       Patient will benefit from skilled therapeutic intervention in order to improve the following deficits and impairments:  Decreased activity tolerance, Decreased balance, Pain, Abnormal gait, Decreased range of motion  Visit Diagnosis: Pain in right ankle and joints of right foot - Plan: PT plan of care cert/re-cert  Stiffness of right ankle, not elsewhere classified - Plan: PT plan of care cert/re-cert     Problem List Patient Active Problem List   Diagnosis Date Noted  . ARF (acute renal failure) ([place]) 07/05/2019  .  Abdominal pain 07/05/2019  . Enteritis 07/05/2019  . 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 Normocytic anemia 12/28/2016  . Postviral gastroparesis   . Type 2 diabetes mellitus ([place]) 10/23/2016  . AKI (acute kidney injury) ([place]) 10/23/2016  . IBS (irritable bowel syndrome) 10/12/2015  . Acute pancreatitis 10/10/2015  . Diarrhea 10/10/2015  . Non-intractable vomiting with nausea 10/10/2015  . Type 2 diabetes mellitus without complication ([place]) [id number]  . Labial abscess 10/10/2015  . Hyponatremia 10/10/2015  . Essential hypertension 10/10/2015  . Tobacco abuse 10/10/2015  . Bartholin cyst 07/03/2014  . Diabetes ([place]) 09/03/2013  . Obesity 09/03/2013  . Vaginal itching 09/03/2013  . Nicotine addiction 09/03/2013  . 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 TENOSYNOVITIS OF FOOT AND ANKLE 09/22/2010  . FOOT PAIN, BILATERAL 09/22/2010   Rayetta Humphrey, PT CLT [phone] 10/08/2019, 2:52 PM  [facility] [address] [place], [place], [address] Phone: [phone]   Fax:  [phone]  Name: Shawna Huerta MRN: [id number] Date of Birth: 07-18-1961

## 2019-10-10 ENCOUNTER — Other Ambulatory Visit: Payer: Self-pay

## 2019-10-10 ENCOUNTER — Ambulatory Visit (HOSPITAL_COMMUNITY): Payer: BC Managed Care – PPO

## 2019-10-10 ENCOUNTER — Encounter (HOSPITAL_COMMUNITY): Payer: Self-pay

## 2019-10-10 DIAGNOSIS — M25671 Stiffness of right ankle, not elsewhere classified: Secondary | ICD-10-CM

## 2019-10-10 DIAGNOSIS — M25571 Pain in right ankle and joints of right foot: Secondary | ICD-10-CM | POA: Diagnosis not present

## 2019-10-10 NOTE — Therapy (Signed)
[place], [place], [address] Phone: [phone]   Fax:  [phone]  Physical Therapy Treatment  Patient Details  Name: Shawna Huerta MRN: [id number] Date of Birth: [birth year] Referring Provider (PT): Mechele Claude    Encounter Date: 10/10/2019  PT End of Session - 10/10/19 1008    Visit Number  2    Number of Visits  8    Date for PT Re-Evaluation  11/05/19    Authorization Type  BCBS    Authorization Time Period  11/10-->11/05/19    Authorization - Visit Number  2    Authorization - Number of Visits  60    PT Start Time  [id number]    PT Stop Time  1042    PT Time Calculation (min)  40 min    Activity Tolerance  Patient tolerated treatment well   Reports slight increase in pain to 2.5/10 was 1/10.   Behavior During Therapy  [facility] for tasks assessed/performed       Past Medical History:  Diagnosis Date  . Anemia   . Arthritis   . Bartholin cyst 07/03/2014  . Colitis 08/2015   Treated at [facility]  . Diabetes mellitus   . Hypertension   . Labial abscess 08/2015   Status post I&D by Dr. Ladona Horns  . Nicotine addiction 09/03/2013  . Obesity   . Pancreatitis 09/2015   unknown etiology, no further work-up with gastroenterology   . Vaginal itching 09/03/2013    Past Surgical History:  Procedure Laterality Date  . ABDOMINAL HYSTERECTOMY    . ABDOMINAL SURGERY     exploratry with TAH  . BALLOON DILATION N/A 10/26/2016   Procedure: Pyloric channel BALLOON DILATION;  Surgeon: Daneil Dolin, MD;  [facility];  Service: Endoscopy;  Laterality: N/A;  . BARTHOLIN GLAND CYST EXCISION Right 08/20/2014   Procedure: EXCISION OF RIGHT BARTHOLIN'S GLAND CYST;  Surgeon: Florian Buff, MD;  Location: [facility];  Service: Gynecology;  Laterality: Right;  . CHOLECYSTECTOMY    . COLONOSCOPY  10/2015   Doristine Mango: mild diverticulosis, at least 8 polpys removed, multiple tubular adenomas. next tcs in 1 year   . ESOPHAGOGASTRODUODENOSCOPY N/A 10/26/2016   Procedure: ESOPHAGOGASTRODUODENOSCOPY (EGD);  Surgeon: Daneil Dolin, MD;  [facility];  Service: Endoscopy;  Laterality: N/A;  . FOOT SURGERY    . FOOT SURGERY Right 2011   shave bone  . TONSILLECTOMY      There were no vitals filed for this visit.  Subjective Assessment - 10/10/19 1001    Subjective  Pt reports she has began the HEP without questions, 2x/daily.  Pain minimal 1/10 achey pain.    Pertinent History  HTN, DM    Patient Stated Goals  less pain, stand and walk longer without pain,    Currently in Pain?  Yes    Pain Score  1     Pain Location  Ankle    Pain Orientation  Right;Medial;Lateral    Pain Descriptors / Indicators  Aching    Pain Type  Acute pain    Pain Onset  More than a month ago    Pain Frequency  Intermittent    Aggravating Factors   any weight bearing activity    Pain Relieving Factors  medication    Effect of Pain on Daily Activities  limits  [facility] PT Treatment/Exercise - 10/10/19 0001      Exercises   Exercises  Ankle      Ankle Exercises: Stretches   Slant Board Stretch  3 reps;30 seconds      Ankle Exercises: Standing   SLS  Rt 15", Lt 19"    Rocker Board  2 minutes    Rocker Board Limitations  lateral and DF/PF    Heel Raises  10 reps;Both    Heel Raises Limitations  2 sets; slow lower     Other Standing Ankle Exercises  Tandem stance on floor, progressed to foam  2x 30"      Ankle Exercises: Seated   ABC's  1 rep    BAPS  Sitting;Level 2;10 reps    BAPS Limitations  DF/PF, Inv/Ev CW/CCW; cueing to reduce knee movements      Ankle Exercises: Supine   T-Band  10x all directions with RTB             PT Education - 10/10/19 1012    Education Details  Reviewed goals, assured compliance wiht HEP.    Person(s) Educated  Patient    Methods  Explanation    Comprehension  Verbalized understanding       PT Short Term Goals -  10/08/19 1433      PT SHORT TERM GOAL #1   Title  PT Rt ankle ROM to be wnl to allow pt to have a normalized gait.    Time  2    Period  Weeks    Status  New    Target Date  10/22/19      PT SHORT TERM GOAL #2   Title  Pt pain in her RT ankle to be no greater than a 2/10 to allow pt to sand/walk for 48minutes to allow pt to complete shopping, and household cleaning without having to take breakes.    Time  2    Period  Weeks    Status  New      PT SHORT TERM GOAL #3   Title  Pt to no longer be wearing an ankle brace on her Rt LE for support    Time  2    Period  Weeks    Status  New      PT SHORT TERM GOAL #4   Title  PT to be able to single leg stance on her right LE for at least 30 seconds to reduce risk of falling.    Time  2    Period  Weeks        PT Long Term Goals - 10/08/19 1443      PT LONG TERM GOAL #1   Title  Pt Rt ankle pain to be no greater than a 1/10 to allow pt to be able to stand/walk for two hours at a time to prepare to return to work.    Time  4    Period  Weeks    Status  New    Target Date  11/05/19      PT LONG TERM GOAL #2   Title  Pt to be able to single leg stance for 60 seconds on her rt LE to allow her to feel confident walking on uneven terrain.    Time  4    Period  Weeks    Status  New      PT LONG TERM GOAL #3   Title  PT Rt ankle strength to be 5/5  to allow pt to go up and down a flight of steps with no discomfort.    Time  4    Period  Weeks    Status  New            Plan - 10/10/19 1104    Clinical Impression Statement  Reviewed goals and assured compliance wiht HEP, pt able to recall and demonstrate appropriate form and technqiue with HEP.  Began ankle mobility and strengthening exercises in standing and seated BAPS/theraband exercises.  Pt able to complete all exericses with min cueing to reduce compensation with weight shifting and with knee during seated BAPS.  Reports slight increase in pan at EOS to 2.5/10 was 1/10  initially.    Personal Factors and Comorbidities  Past/Current Experience    Examination-Activity Limitations  Carry;Locomotion Level;Stand    Examination-Participation Restrictions  Yard Work;Community Activity;Cleaning    Stability/Clinical Decision Making  Stable/Uncomplicated    Clinical Decision Making  Moderate    Rehab Potential  Good    PT Frequency  2x / week    PT Duration  4 weeks    PT Treatment/Interventions  Gait training;Stair training;Functional mobility training;Therapeutic activities;Therapeutic exercise;Balance training;Manual techniques;Patient/family education;Passive range of motion    PT Next Visit Plan  Review form wiht theraband all direction and add to HEP.  Continue slant board stretch and heel raise wiht slow lowering.  Increased seated BAPS to L3 next session and progress to standing.  Continue balance activities SLS and tandem on foam.  Progress as able.    PT Home Exercise Plan  RoM, Ankle alphabet exercises       Patient will benefit from skilled therapeutic intervention in order to improve the following deficits and impairments:  Decreased activity tolerance, Decreased balance, Pain, Abnormal gait, Decreased range of motion  Visit Diagnosis: Pain in right ankle and joints of right foot  Stiffness of right ankle, not elsewhere classified     Problem List Patient Active Problem List   Diagnosis Date Noted  . ARF (acute renal failure) ([place]) 07/05/2019  . Abdominal pain 07/05/2019  . Enteritis 07/05/2019  . 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 Normocytic anemia 12/28/2016  . Postviral gastroparesis   . Type 2 diabetes mellitus ([place]) 10/23/2016  . AKI (acute kidney injury) ([place]) 10/23/2016  . IBS (irritable bowel syndrome) 10/12/2015  . Acute pancreatitis 10/10/2015  . Diarrhea 10/10/2015  . Non-intractable vomiting with nausea 10/10/2015  . Type 2 diabetes mellitus without complication ([place]) [id number]  . Labial abscess 10/10/2015  . Hyponatremia  10/10/2015  . Essential hypertension 10/10/2015  . Tobacco abuse 10/10/2015  . Bartholin cyst 07/03/2014  . Diabetes ([place]) 09/03/2013  . Obesity 09/03/2013  . Vaginal itching 09/03/2013  . Nicotine addiction 09/03/2013  . 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 TENOSYNOVITIS OF FOOT AND ANKLE 09/22/2010  . FOOT PAIN, BILATERAL 09/22/2010   Ihor Austin, LPTA; [place]  Aldona Lento 10/10/2019, 12:33 PM  [facility] [address] [place], [place], [address] Phone: [phone]   Fax:  [phone]  Name: Shawna Huerta MRN: [id number] Date of Birth: [birth year]

## 2019-10-16 ENCOUNTER — Telehealth (HOSPITAL_COMMUNITY): Payer: Self-pay

## 2019-10-16 ENCOUNTER — Ambulatory Visit (HOSPITAL_COMMUNITY): Payer: BC Managed Care – PPO

## 2019-10-16 NOTE — Telephone Encounter (Signed)
pt called to cancel today's appt due to she is not feeling well. 

## 2019-10-17 ENCOUNTER — Encounter (HOSPITAL_COMMUNITY): Payer: Self-pay | Admitting: Physical Therapy

## 2019-10-17 ENCOUNTER — Ambulatory Visit (HOSPITAL_COMMUNITY): Payer: BC Managed Care – PPO | Admitting: Physical Therapy

## 2019-10-17 ENCOUNTER — Other Ambulatory Visit: Payer: Self-pay

## 2019-10-17 DIAGNOSIS — M25571 Pain in right ankle and joints of right foot: Secondary | ICD-10-CM

## 2019-10-17 DIAGNOSIS — M25671 Stiffness of right ankle, not elsewhere classified: Secondary | ICD-10-CM

## 2019-10-17 NOTE — Therapy (Signed)
[place], [place], [address] Phone: [phone]   Fax:  [phone]  Physical Therapy Treatment  Patient Details  Name: Shawna Huerta MRN: [id number] Date of Birth: November 11, 1961 Referring Provider (PT): Mechele Claude    Encounter Date: 10/17/2019  PT End of Session - 10/17/19 1545    Visit Number  3    Number of Visits  8    Date for PT Re-Evaluation  11/05/19    Authorization Type  BCBS    Authorization Time Period  11/10-->11/05/19    Authorization - Visit Number  3    Authorization - Number of Visits  3    PT Start Time  [id number]    PT Stop Time  1610    PT Time Calculation (min)  40 min    Activity Tolerance  Patient tolerated treatment well   Reports slight increase in pain to 2.5/10 was 1/10.   Behavior During Therapy  [facility] for tasks assessed/performed       Past Medical History:  Diagnosis Date  . Anemia   . Arthritis   . Bartholin cyst 07/03/2014  . Colitis 08/2015   Treated at [facility]  . Diabetes mellitus   . Hypertension   . Labial abscess 08/2015   Status post I&D by Dr. Ladona Horns  . Nicotine addiction 09/03/2013  . Obesity   . Pancreatitis 09/2015   unknown etiology, no further work-up with gastroenterology   . Vaginal itching 09/03/2013    Past Surgical History:  Procedure Laterality Date  . ABDOMINAL HYSTERECTOMY    . ABDOMINAL SURGERY     exploratry with TAH  . BALLOON DILATION N/A 10/26/2016   Procedure: Pyloric channel BALLOON DILATION;  Surgeon: Daneil Dolin, MD;  [facility];  Service: Endoscopy;  Laterality: N/A;  . BARTHOLIN GLAND CYST EXCISION Right 08/20/2014   Procedure: EXCISION OF RIGHT BARTHOLIN'S GLAND CYST;  Surgeon: Florian Buff, MD;  Location: [facility];  Service: Gynecology;  Laterality: Right;  . CHOLECYSTECTOMY    . COLONOSCOPY  10/2015   Doristine Mango: mild diverticulosis, at least 8 polpys removed, multiple tubular adenomas. next tcs in 1 year   . ESOPHAGOGASTRODUODENOSCOPY N/A 10/26/2016   Procedure: ESOPHAGOGASTRODUODENOSCOPY (EGD);  Surgeon: Daneil Dolin, MD;  [facility];  Service: Endoscopy;  Laterality: N/A;  . FOOT SURGERY    . FOOT SURGERY Right 2011   shave bone  . TONSILLECTOMY      There were no vitals filed for this visit.  Subjective Assessment - 10/17/19 1531    Subjective  Pt staes that her ankle is feeling better.    Pertinent History  HTN, DM    Patient Stated Goals  less pain, stand and walk longer without pain,    Currently in Pain?  Yes    Pain Score  1     Pain Location  Ankle    Pain Orientation  Right    Pain Descriptors / Indicators  Aching    Pain Type  Acute pain    Pain Onset  More than a month ago    Pain Frequency  Intermittent    Aggravating Factors   wt bearing    Pain Relieving Factors  rest         [facility] PT Assessment - 10/17/19 0001      AROM   Right Ankle Dorsiflexion  8    Right Ankle Plantar Flexion  52  Right Ankle Inversion  15    Right Ankle Eversion  12      Ambulation/Gait   Stairs  Yes    Stairs Assistance  6: Modified independent (Device/Increase time)    Number of Stairs  12                   [facility] Adult PT Treatment/Exercise - 10/17/19 0001      Exercises   Exercises  Ankle      Ankle Exercises: Stretches   Plantar Fascia Stretch  3 reps;30 seconds    Slant Board Stretch  3 reps;30 seconds      Ankle Exercises: Standing   BAPS  Standing;Level 2;5 reps    SLS  Rt 10", Lt 25"    Heel Raises  10 reps;Both    Toe Raise  10 reps    Other Standing Ankle Exercises  Tandem stance on foam     Other Standing Ankle Exercises  forward lunge onto RT LE on 4" step x 10       Ankle Exercises: Supine   T-Band  all 10 reps                PT Short Term Goals - 10/08/19 1433      PT SHORT TERM GOAL #1   Title  PT Rt ankle ROM to be wnl to allow pt to have a normalized gait.    Time  2    Period  Weeks    Status  New     Target Date  10/22/19      PT SHORT TERM GOAL #2   Title  Pt pain in her RT ankle to be no greater than a 2/10 to allow pt to sand/walk for 67minutes to allow pt to complete shopping, and household cleaning without having to take breakes.    Time  2    Period  Weeks    Status  New      PT SHORT TERM GOAL #3   Title  Pt to no longer be wearing an ankle brace on her Rt LE for support    Time  2    Period  Weeks    Status  New      PT SHORT TERM GOAL #4   Title  PT to be able to single leg stance on her right LE for at least 30 seconds to reduce risk of falling.    Time  2    Period  Weeks        PT Long Term Goals - 10/08/19 1443      PT LONG TERM GOAL #1   Title  Pt Rt ankle pain to be no greater than a 1/10 to allow pt to be able to stand/walk for two hours at a time to prepare to return to work.    Time  4    Period  Weeks    Status  New    Target Date  11/05/19      PT LONG TERM GOAL #2   Title  Pt to be able to single leg stance for 60 seconds on her rt LE to allow her to feel confident walking on uneven terrain.    Time  4    Period  Weeks    Status  New      PT LONG TERM GOAL #3   Title  PT Rt ankle strength to be 5/5 to allow pt to go up and  down a flight of steps with no discomfort.    Time  4    Period  Weeks    Status  New            Plan - 10/17/19 1545    Clinical Impression Statement  Pt continues to feel better, ROM improved.  Therapist gave t-band to pt for HEP,advanced Baps to standing.    Personal Factors and Comorbidities  Past/Current Experience    Examination-Activity Limitations  Carry;Locomotion Level;Stand    Examination-Participation Restrictions  Yard Work;Community Activity;Cleaning    Stability/Clinical Decision Making  Stable/Uncomplicated    Rehab Potential  Good    PT Frequency  2x / week    PT Duration  4 weeks    PT Treatment/Interventions  Gait training;Stair training;Functional mobility training;Therapeutic  activities;Therapeutic exercise;Balance training;Manual techniques;Patient/family education;Passive range of motion    PT Next Visit Plan  Continue slant board stretch and heel raise wiht slow lowering.  Increased seated BAPS to L2 next session and progress to standing.  Continue balance activities SLS and tandem on foam.  Progress as able.    PT Home Exercise Plan  RoM, Ankle alphabet exercises       Patient will benefit from skilled therapeutic intervention in order to improve the following deficits and impairments:  Decreased activity tolerance, Decreased balance, Pain, Abnormal gait, Decreased range of motion  Visit Diagnosis: Pain in right ankle and joints of right foot  Stiffness of right ankle, not elsewhere classified     Problem List Patient Active Problem List   Diagnosis Date Noted  . ARF (acute renal failure) ([place]) 07/05/2019  . Abdominal pain 07/05/2019  . Enteritis 07/05/2019  . 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 Normocytic anemia 12/28/2016  . Postviral gastroparesis   . Type 2 diabetes mellitus ([place]) 10/23/2016  . AKI (acute kidney injury) ([place]) 10/23/2016  . IBS (irritable bowel syndrome) 10/12/2015  . Acute pancreatitis 10/10/2015  . Diarrhea 10/10/2015  . Non-intractable vomiting with nausea 10/10/2015  . Type 2 diabetes mellitus without complication ([place]) [id number]  . Labial abscess 10/10/2015  . Hyponatremia 10/10/2015  . Essential hypertension 10/10/2015  . Tobacco abuse 10/10/2015  . Bartholin cyst 07/03/2014  . Diabetes ([place]) 09/03/2013  . Obesity 09/03/2013  . Vaginal itching 09/03/2013  . Nicotine addiction 09/03/2013  . 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 TENOSYNOVITIS OF FOOT AND ANKLE 09/22/2010  . FOOT PAIN, BILATERAL 09/22/2010   Rayetta Humphrey, PT CLT [phone] 10/17/2019, 4:20 PM  [facility] [address] [place], [place], [address] Phone: [phone]   Fax:  [phone]  Name:  Shawna Huerta MRN: [id number] Date of Birth: 04/28/1961

## 2019-10-22 ENCOUNTER — Telehealth (HOSPITAL_COMMUNITY): Payer: Self-pay

## 2019-10-22 ENCOUNTER — Ambulatory Visit (HOSPITAL_COMMUNITY): Payer: BC Managed Care – PPO

## 2019-10-22 NOTE — Telephone Encounter (Signed)
No show, called and attempted to left message, unsure if went through as very short amount to talk and unable to leave full message during the length of time.    [address], [place]; [employer] [phone]

## 2019-10-23 ENCOUNTER — Telehealth (HOSPITAL_COMMUNITY): Payer: Self-pay

## 2019-10-23 ENCOUNTER — Ambulatory Visit (HOSPITAL_COMMUNITY): Payer: BC Managed Care – PPO

## 2019-10-23 NOTE — Telephone Encounter (Signed)
She is not feeling well and will not be here today

## 2019-10-29 ENCOUNTER — Ambulatory Visit (HOSPITAL_COMMUNITY): Payer: BC Managed Care – PPO | Attending: Student

## 2019-10-29 ENCOUNTER — Encounter (HOSPITAL_COMMUNITY): Payer: Self-pay

## 2019-10-29 ENCOUNTER — Other Ambulatory Visit: Payer: Self-pay

## 2019-10-29 DIAGNOSIS — M25671 Stiffness of right ankle, not elsewhere classified: Secondary | ICD-10-CM | POA: Diagnosis not present

## 2019-10-29 DIAGNOSIS — M25571 Pain in right ankle and joints of right foot: Secondary | ICD-10-CM | POA: Diagnosis present

## 2019-10-29 NOTE — Patient Instructions (Signed)
Gastroc Stretch    Stand with right foot back, leg straight, forward leg bent. Keeping heel on floor, turned slightly out, lean into wall until stretch is felt in calf. Hold 30 seconds. Repeat 3 times per set. Do 2 sets per day.   http://orth.exer.us/27   Copyright  VHI. All rights reserved.   Dorsiflexion (Eccentric), (Resistance Band)    Pull foot up against resistance band. Slowly release for 3-5 seconds. Use green resistance band.  http://ecce.exer.us/1   Copyright  VHI. All rights reserved.   Plantarflexion (Eccentric), (Resistance Band)    Point foot down against resistance band. Slowly release for 3-5 seconds. Use green resistance band.  http://ecce.exer.us/3   Copyright  VHI. All rights reserved.   Inversion (Eccentric), (Resistance Band)    Pull foot in against resistance band. Slowly release for 3-5 seconds. Use green resistance band. http://ecce.exer.us/5   Copyright  VHI. All rights reserved.   Eversion / Dorsiflexion (Eccentric), (Resistance Band)    Push foot out and up against resistance band. Slowly release for 3-5 seconds. Use green resistance band. http://ecce.exer.us/11   Copyright  VHI. All rights reserved.

## 2019-10-29 NOTE — Therapy (Signed)
[place], [place], [address] Phone: [phone]   Fax:  [phone]  Physical Therapy Treatment  Patient Details  Name: Shawna Huerta MRN: [id number] Date of Birth: [birth year] Referring Provider (PT): Mechele Claude    Encounter Date: 10/29/2019  PT End of Session - 10/29/19 1140    Visit Number  4    Number of Visits  8    Date for PT Re-Evaluation  11/05/19    Authorization Type  BCBS    Authorization Time Period  11/10-->11/05/19    Authorization - Visit Number  4    Authorization - Number of Visits  60    PT Start Time  [id number]    PT Stop Time  1212    PT Time Calculation (min)  38 min    Activity Tolerance  Patient tolerated treatment well;Patient limited by pain    Behavior During Therapy  [facility] for tasks assessed/performed       Past Medical History:  Diagnosis Date  . Anemia   . Arthritis   . Bartholin cyst 07/03/2014  . Colitis 08/2015   Treated at [facility]  . Diabetes mellitus   . Hypertension   . Labial abscess 08/2015   Status post I&D by Dr. Ladona Horns  . Nicotine addiction 09/03/2013  . Obesity   . Pancreatitis 09/2015   unknown etiology, no further work-up with gastroenterology   . Vaginal itching 09/03/2013    Past Surgical History:  Procedure Laterality Date  . ABDOMINAL HYSTERECTOMY    . ABDOMINAL SURGERY     exploratry with TAH  . BALLOON DILATION N/A 10/26/2016   Procedure: Pyloric channel BALLOON DILATION;  Surgeon: Daneil Dolin, MD;  [facility];  Service: Endoscopy;  Laterality: N/A;  . BARTHOLIN GLAND CYST EXCISION Right 08/20/2014   Procedure: EXCISION OF RIGHT BARTHOLIN'S GLAND CYST;  Surgeon: Florian Buff, MD;  Location: [facility] ORS;  Service: Gynecology;  Laterality: Right;  . CHOLECYSTECTOMY    . COLONOSCOPY  10/2015   Doristine Mango: mild diverticulosis, at least 8 polpys removed, multiple tubular adenomas. next tcs in 1 year  . ESOPHAGOGASTRODUODENOSCOPY  N/A 10/26/2016   Procedure: ESOPHAGOGASTRODUODENOSCOPY (EGD);  Surgeon: Daneil Dolin, MD;  Location[facility];  Service: Endoscopy;  Laterality: N/A;  . FOOT SURGERY    . FOOT SURGERY Right 2011   shave bone  . TONSILLECTOMY      There were no vitals filed for this visit.  Subjective Assessment - 10/29/19 1133    Subjective  Pt RTW 10/21/19 and worked for 2 days then had to stop work due to pain and edema.  Has apt on Thursday for MRI.    Pertinent History  HTN, DM    Patient Stated Goals  less pain, stand and walk longer without pain,    Currently in Pain?  Yes    Pain Score  4     Pain Location  Ankle    Pain Orientation  Right    Pain Descriptors / Indicators  Sore;Aching    Pain Type  Acute pain    Pain Onset  More than a month ago    Pain Frequency  Constant    Aggravating Factors   weight bearing    Pain Relieving Factors  rest    Effect of Pain on Daily Activities  limits  [facility] Adult PT Treatment/Exercise - 10/29/19 0001      Exercises   Exercises  Ankle      Manual Therapy   Manual Therapy  Edema management    Manual therapy comments  Manual complete separate than rest of tx      Ankle Exercises: Stretches   Slant Board Stretch  3 reps;30 seconds      Ankle Exercises: Standing   BAPS  Standing;Level 2;10 reps    BAPS Limitations  increase L3 next session    SLS  Rt 19"; Lt 25" max    Heel Raises  10 reps;Both    Toe Raise  10 reps    Other Standing Ankle Exercises  Tandem stance on foam     Other Standing Ankle Exercises  forward lunge onto RT LE on 4" step x 10                PT Short Term Goals - 10/08/19 1433      PT SHORT TERM GOAL #1   Title  PT Rt ankle ROM to be wnl to allow pt to have a normalized gait.    Time  2    Period  Weeks    Status  New    Target Date  10/22/19      PT SHORT TERM GOAL #2   Title  Pt pain in her RT ankle to be no greater than a 2/10 to allow pt to sand/walk for  51minutes to allow pt to complete shopping, and household cleaning without having to take breakes.    Time  2    Period  Weeks    Status  New      PT SHORT TERM GOAL #3   Title  Pt to no longer be wearing an ankle brace on her Rt LE for support    Time  2    Period  Weeks    Status  New      PT SHORT TERM GOAL #4   Title  PT to be able to single leg stance on her right LE for at least 30 seconds to reduce risk of falling.    Time  2    Period  Weeks        PT Long Term Goals - 10/08/19 1443      PT LONG TERM GOAL #1   Title  Pt Rt ankle pain to be no greater than a 1/10 to allow pt to be able to stand/walk for two hours at a time to prepare to return to work.    Time  4    Period  Weeks    Status  New    Target Date  11/05/19      PT LONG TERM GOAL #2   Title  Pt to be able to single leg stance for 60 seconds on her rt LE to allow her to feel confident walking on uneven terrain.    Time  4    Period  Weeks    Status  New      PT LONG TERM GOAL #3   Title  PT Rt ankle strength to be 5/5 to allow pt to go up and down a flight of steps with no discomfort.    Time  4    Period  Weeks    Status  New            Plan - 10/29/19 1222    Clinical Impression Statement  Pt limited by pain and reports edema present following first day of RTW last week.  Continued with ankle mobility, balance and strengthening exercises with reports of increased pain by 1 grade.  Added retrograde massage for edema and pain control at EOS.    Personal Factors and Comorbidities  Past/Current Experience    Examination-Activity Limitations  Carry;Locomotion Level;Stand    Examination-Participation Restrictions  Yard Work;Community Activity;Cleaning    Stability/Clinical Decision Making  Stable/Uncomplicated    Clinical Decision Making  Moderate    Rehab Potential  Good    PT Frequency  2x / week    PT Duration  4 weeks    PT Treatment/Interventions  Gait training;Stair training;Functional  mobility training;Therapeutic activities;Therapeutic exercise;Balance training;Manual techniques;Patient/family education;Passive range of motion    PT Next Visit Plan  Pt reports MRI scheduled this Thursday following PT session.  Continue slant board stretch and heel raise wiht slow lowering.  Increased seated BAPS to L2 next session and progress to standing.  Continue balance activities SLS and tandem on foam.  Progress as able.    PT Home Exercise Plan  RoM, Ankle alphabet exercises       Patient will benefit from skilled therapeutic intervention in order to improve the following deficits and impairments:  Decreased activity tolerance, Decreased balance, Pain, Abnormal gait, Decreased range of motion  Visit Diagnosis: Stiffness of right ankle, not elsewhere classified  Pain in right ankle and joints of right foot     Problem List Patient Active Problem List   Diagnosis Date Noted  . ARF (acute renal failure) ([place]) 07/05/2019  . Abdominal pain 07/05/2019  . Enteritis 07/05/2019  . 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 Normocytic anemia 12/28/2016  . Postviral gastroparesis   . Type 2 diabetes mellitus ([place]) 10/23/2016  . AKI (acute kidney injury) ([place]) 10/23/2016  . IBS (irritable bowel syndrome) 10/12/2015  . Acute pancreatitis 10/10/2015  . Diarrhea 10/10/2015  . Non-intractable vomiting with nausea 10/10/2015  . Type 2 diabetes mellitus without complication ([place]) [id number]  . Labial abscess 10/10/2015  . Hyponatremia 10/10/2015  . Essential hypertension 10/10/2015  . Tobacco abuse 10/10/2015  . Bartholin cyst 07/03/2014  . Diabetes ([place]) 09/03/2013  . Obesity 09/03/2013  . Vaginal itching 09/03/2013  . Nicotine addiction 09/03/2013  . 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 TENOSYNOVITIS OF FOOT AND ANKLE 09/22/2010  . FOOT PAIN, BILATERAL 09/22/2010   Ihor Austin, LPTA; [place]   Aldona Lento 10/29/2019, 12:25 PM  [place] [address] [place], [place], [address] Phone: [phone]   Fax:  [phone]  Name: Shawna Huerta MRN: [id number] Date of Birth: 1961/05/06

## 2019-10-31 ENCOUNTER — Other Ambulatory Visit: Payer: Self-pay

## 2019-10-31 ENCOUNTER — Encounter (HOSPITAL_COMMUNITY): Payer: Self-pay

## 2019-10-31 ENCOUNTER — Ambulatory Visit (HOSPITAL_COMMUNITY): Payer: BC Managed Care – PPO

## 2019-10-31 DIAGNOSIS — M25571 Pain in right ankle and joints of right foot: Secondary | ICD-10-CM

## 2019-10-31 DIAGNOSIS — M25671 Stiffness of right ankle, not elsewhere classified: Secondary | ICD-10-CM | POA: Diagnosis not present

## 2019-10-31 NOTE — Patient Instructions (Signed)
Toe / Heel Raise (Standing)    Standing with support, raise heels, then rock back on heels and raise toes. Repeat 10 times.  Copyright  VHI. All rights reserved.      

## 2019-10-31 NOTE — Therapy (Signed)
[place], [place], [address] Phone: [phone]   Fax:  [phone]  Physical Therapy Treatment  Patient Details  Name: Shawna Huerta MRN: [id number] Date of Birth: 1961-11-12 Referring Provider (PT): Mechele Claude    Encounter Date: 10/31/2019  PT End of Session - 10/31/19 1141    Visit Number  5    Number of Visits  8    Date for PT Re-Evaluation  11/05/19    Authorization Type  BCBS    Authorization Time Period  11/10-->11/05/19    Authorization - Visit Number  5    Authorization - Number of Visits  60    PT Start Time  [id number]    PT Stop Time  1213    PT Time Calculation (min)  38 min    Activity Tolerance  Patient tolerated treatment well    Behavior During Therapy  [facility] for tasks assessed/performed       Past Medical History:  Diagnosis Date  . Anemia   . Arthritis   . Bartholin cyst 07/03/2014  . Colitis 08/2015   Treated at [facility]  . Diabetes mellitus   . Hypertension   . Labial abscess 08/2015   Status post I&D by Dr. Ladona Horns  . Nicotine addiction 09/03/2013  . Obesity   . Pancreatitis 09/2015   unknown etiology, no further work-up with gastroenterology   . Vaginal itching 09/03/2013    Past Surgical History:  Procedure Laterality Date  . ABDOMINAL HYSTERECTOMY    . ABDOMINAL SURGERY     exploratry with TAH  . BALLOON DILATION N/A 10/26/2016   Procedure: Pyloric channel BALLOON DILATION;  Surgeon: Daneil Dolin, MD;  [facility];  Service: Endoscopy;  Laterality: N/A;  . BARTHOLIN GLAND CYST EXCISION Right 08/20/2014   Procedure: EXCISION OF RIGHT BARTHOLIN'S GLAND CYST;  Surgeon: Florian Buff, MD;  Location: [facility] ORS;  Service: Gynecology;  Laterality: Right;  . CHOLECYSTECTOMY    . COLONOSCOPY  10/2015   Doristine Mango: mild diverticulosis, at least 8 polpys removed, multiple tubular adenomas. next tcs in 1 year  . ESOPHAGOGASTRODUODENOSCOPY N/A 10/26/2016    Procedure: ESOPHAGOGASTRODUODENOSCOPY (EGD);  Surgeon: Daneil Dolin, MD;  [facility];  Service: Endoscopy;  Laterality: N/A;  . FOOT SURGERY    . FOOT SURGERY Right 2011   shave bone  . TONSILLECTOMY      There were no vitals filed for this visit.  Subjective Assessment - 10/31/19 1139    Subjective  Pt reports she feels a lot better today, reports she iced ankle following session and assisted with edema and pain.  Has MRI scheduled later today    Pertinent History  HTN, DM    Patient Stated Goals  less pain, stand and walk longer without pain,    Currently in Pain?  No/denies                       [facility] Adult PT Treatment/Exercise - 10/31/19 0001      Exercises   Exercises  Ankle      Ankle Exercises: Stretches   Plantar Fascia Stretch  3 reps;30 seconds    Slant Board Stretch  3 reps;30 seconds      Ankle Exercises: Standing   BAPS  Standing;Level 3;10 reps    SLS  Rt 17", Lt 24" max    Heel Raises  Both;10 reps    Heel Raises  Limitations  2 sets; slow lower; increase slope    Toe Raise  15 reps    Toe Raise Limitations  incline slope    Balance Beam  tandem gait 2RT    Other Standing Ankle Exercises  Tandem stance on foam 3x 30"    Other Standing Ankle Exercises  forward lunge onto RT LE on 4" step x 10  no HHA               PT Short Term Goals - 10/08/19 1433      PT SHORT TERM GOAL #1   Title  PT Rt ankle ROM to be wnl to allow pt to have a normalized gait.    Time  2    Period  Weeks    Status  New    [place] Date  10/22/19      PT SHORT TERM GOAL #2   Title  Pt pain in her RT ankle to be no greater than a 2/10 to allow pt to sand/walk for 31minutes to allow pt to complete shopping, and household cleaning without having to take breakes.    Time  2    Period  Weeks    Status  New      PT SHORT TERM GOAL #3   Title  Pt to no longer be wearing an ankle brace on her Rt LE for support    Time  2    Period  Weeks    Status   New      PT SHORT TERM GOAL #4   Title  PT to be able to single leg stance on her right LE for at least 30 seconds to reduce risk of falling.    Time  2    Period  Weeks        PT Long Term Goals - 10/08/19 1443      PT LONG TERM GOAL #1   Title  Pt Rt ankle pain to be no greater than a 1/10 to allow pt to be able to stand/walk for two hours at a time to prepare to return to work.    Time  4    Period  Weeks    Status  New    [place] Date  11/05/19      PT LONG TERM GOAL #2   Title  Pt to be able to single leg stance for 60 seconds on her rt LE to allow her to feel confident walking on uneven terrain.    Time  4    Period  Weeks    Status  New      PT LONG TERM GOAL #3   Title  PT Rt ankle strength to be 5/5 to allow pt to go up and down a flight of steps with no discomfort.    Time  4    Period  Weeks    Status  New            Plan - 10/31/19 1241    Clinical Impression Statement  Progressed to BAPS L3 in standing for proprioception, mobility and strengthening, pt demonstrated good mechanics with increased challenge.  Progressed balance with additonal tandem gait on solid then dynamic surfaces with a couple LOB episodes that pt able to recover independently.  Pt did report some pain at EOS on plantar surface of foot, resumed plantar fascia stretch with reports of relief.  Improved dorsiflexion to 13 degrees    Personal Factors and Comorbidities  Past/Current  Experience    Examination-Activity Limitations  Carry;Locomotion Level;Stand    Examination-Participation Restrictions  Yard Work;Community Activity;Cleaning    Stability/Clinical Decision Making  Stable/Uncomplicated    Clinical Decision Making  Moderate    Rehab Potential  Good    PT Frequency  2x / week    PT Duration  4 weeks    PT Treatment/Interventions  Gait training;Stair training;Functional mobility training;Therapeutic activities;Therapeutic exercise;Balance training;Manual techniques;Patient/family  education;Passive range of motion    PT Next Visit Plan  F/U on MRI 10/31/19.Marland Kitchen  Continue slant board stretch and heel raise wiht slow lowering.  Continue balance activities SLS and tandem on foam.  Progress as able.    PT Home Exercise Plan  RoM, Ankle alphabet exercises       Patient will benefit from skilled therapeutic intervention in order to improve the following deficits and impairments:  Decreased activity tolerance, Decreased balance, Pain, Abnormal gait, Decreased range of motion  Visit Diagnosis: Pain in right ankle and joints of right foot  Stiffness of right ankle, not elsewhere classified     Problem List Patient Active Problem List   Diagnosis Date Noted  . ARF (acute renal failure) ([place]) 07/05/2019  . Abdominal pain 07/05/2019  . Enteritis 07/05/2019  . 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 Normocytic anemia 12/28/2016  . Postviral gastroparesis   . Type 2 diabetes mellitus ([place]) 10/23/2016  . AKI (acute kidney injury) ([place]) 10/23/2016  . IBS (irritable bowel syndrome) 10/12/2015  . Acute pancreatitis 10/10/2015  . Diarrhea 10/10/2015  . Non-intractable vomiting with nausea 10/10/2015  . Type 2 diabetes mellitus without complication ([place]) [id number]  . Labial abscess 10/10/2015  . Hyponatremia 10/10/2015  . Essential hypertension 10/10/2015  . Tobacco abuse 10/10/2015  . Bartholin cyst 07/03/2014  . Diabetes ([place]) 09/03/2013  . Obesity 09/03/2013  . Vaginal itching 09/03/2013  . Nicotine addiction 09/03/2013  . 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 TENOSYNOVITIS OF FOOT AND ANKLE 09/22/2010  . FOOT PAIN, BILATERAL 09/22/2010   Ihor Austin, LPTA; [place]  Aldona Lento 10/31/2019, 12:46 PM  [place] [address] [place], [place], [address] Phone: [phone]   Fax:  [phone]  Name: Shawna Huerta MRN: [id number] Date of Birth: November 26, 1961

## 2020-01-30 ENCOUNTER — Ambulatory Visit: Payer: BC Managed Care – PPO | Attending: Internal Medicine

## 2020-01-30 DIAGNOSIS — Z23 Encounter for immunization: Secondary | ICD-10-CM | POA: Insufficient documentation

## 2020-01-30 NOTE — Progress Notes (Signed)
   Covid-19 Vaccination Clinic  Name:  Shawna Huerta    MRN: [id number] DOB: 07-23-1961  01/30/2020  [name] was observed post Covid-19 immunization for 15 minutes without incident. She was provided with Vaccine Information Sheet and instruction to access the V-Safe system.   [name] was instructed to call 911 with any severe reactions post vaccine: Marland Kitchen Difficulty breathing  . Swelling of face and throat  . A fast heartbeat  . A bad rash all over body  . Dizziness and weakness   Immunizations Administered    Name Date Dose VIS Date Route   Moderna COVID-19 Vaccine 01/30/2020  8:43 AM 0.5 mL 10/29/2019 Intramuscular   Manufacturer: Moderna   Lot: [id number]   [id number]

## 2020-03-03 ENCOUNTER — Ambulatory Visit: Payer: Self-pay | Attending: Internal Medicine

## 2020-03-03 DIAGNOSIS — Z23 Encounter for immunization: Secondary | ICD-10-CM

## 2020-03-03 NOTE — Progress Notes (Signed)
   Covid-19 Vaccination Clinic  Name:  Shawna Huerta    MRN: [id number] DOB: June 08, 1961  03/03/2020  [name] was observed post Covid-19 immunization for 15 minutes without incident. She was provided with Vaccine Information Sheet and instruction to access the V-Safe system.   [name] was instructed to call 911 with any severe reactions post vaccine: Marland Kitchen Difficulty breathing  . Swelling of face and throat  . A fast heartbeat  . A bad rash all over body  . Dizziness and weakness   Immunizations Administered    Name Date Dose VIS Date Route   Moderna COVID-19 Vaccine 03/03/2020 11:41 AM 0.5 mL 10/29/2019 Intramuscular   Manufacturer: Moderna   [id number]   [id number]

## 2020-03-12 ENCOUNTER — Ambulatory Visit (HOSPITAL_COMMUNITY): Payer: BC Managed Care – PPO | Attending: Orthopedic Surgery | Admitting: Physical Therapy

## 2020-03-12 ENCOUNTER — Encounter (HOSPITAL_COMMUNITY): Payer: Self-pay | Admitting: Physical Therapy

## 2020-03-12 ENCOUNTER — Other Ambulatory Visit: Payer: Self-pay

## 2020-03-12 DIAGNOSIS — R2689 Other abnormalities of gait and mobility: Secondary | ICD-10-CM

## 2020-03-12 DIAGNOSIS — M6281 Muscle weakness (generalized): Secondary | ICD-10-CM

## 2020-03-12 DIAGNOSIS — M25671 Stiffness of right ankle, not elsewhere classified: Secondary | ICD-10-CM | POA: Diagnosis present

## 2020-03-12 DIAGNOSIS — M25571 Pain in right ankle and joints of right foot: Secondary | ICD-10-CM | POA: Insufficient documentation

## 2020-03-12 NOTE — Therapy (Signed)
[place], [place], [address] Phone: [phone]   Fax:  [phone]  Physical Therapy Evaluation  Patient Details  Name: Shawna Huerta MRN: [id number] Date of Birth: December 31, 1960 Referring Provider (PT): Wylene Simmer MD   Encounter Date: 03/12/2020  PT End of Session - 03/12/20 1128    Visit Number  1    Number of Visits  12    Date for PT Re-Evaluation  04/23/20    Authorization Type  BCBS (visit limit 60, no auth)    Authorization - Number of Visits  60    PT Start Time  1032    PT Stop Time  1124    PT Time Calculation (min)  52 min    Activity Tolerance  Patient tolerated treatment well    Behavior During Therapy  [facility] for tasks assessed/performed       Past Medical History:  Diagnosis Date  . Anemia   . Arthritis   . Bartholin cyst 07/03/2014  . Colitis 08/2015   Treated at [facility]  . Diabetes mellitus   . Hypertension   . Labial abscess 08/2015   Status post I&D by Dr. Ladona Horns  . Nicotine addiction 09/03/2013  . Obesity   . Pancreatitis 09/2015   unknown etiology, no further work-up with gastroenterology   . Vaginal itching 09/03/2013    Past Surgical History:  Procedure Laterality Date  . ABDOMINAL HYSTERECTOMY    . ABDOMINAL SURGERY     exploratry with TAH  . BALLOON DILATION N/A 10/26/2016   Procedure: Pyloric channel BALLOON DILATION;  Surgeon: Daneil Dolin, MD;  [facility];  Service: Endoscopy;  Laterality: N/A;  . BARTHOLIN GLAND CYST EXCISION Right 08/20/2014   Procedure: EXCISION OF RIGHT BARTHOLIN'S GLAND CYST;  Surgeon: Florian Buff, MD;  Location: [facility];  Service: Gynecology;  Laterality: Right;  . CHOLECYSTECTOMY    . COLONOSCOPY  10/2015   Doristine Mango: mild diverticulosis, at least 8 polpys removed, multiple tubular adenomas. next tcs in 1 year  . ESOPHAGOGASTRODUODENOSCOPY N/A 10/26/2016   Procedure: ESOPHAGOGASTRODUODENOSCOPY (EGD);  Surgeon: Daneil Dolin, MD;  [facility];  Service: Endoscopy;  Laterality: N/A;  . FOOT SURGERY    . FOOT SURGERY Right 2011   shave bone  . TONSILLECTOMY      There were no vitals filed for this visit.   Subjective Assessment - 03/12/20 1042    Subjective  Patient is a 59 y.o. female who presents to physical therapy with c/o R ankle/foot pain. She states she feels that her tendon is stretching and not torn and that is what her MD said. She has history of ankle surgery and symptoms began increasing since September 2020. She was coming to therapy in Nov/Dec 2020. She was able to go back to work for several months and is now out of work due to pain. She was feeling good with therapy. She feels that standing, walking, and weightbearing increase symptoms. Rest and ice makes it better for a limited time. She was doing exercises until she went back to work and then she stopped. She has since started them again but feels it may be making things worse than helping. She wears steel toe boots at work and is required to stand all day. Her main goal for therapy is to decrease pain.    Pertinent History  HTN, DM    Limitations  Lifting;Standing;Walking;House hold activities  How long can you stand comfortably?  45 minutes    How long can you walk comfortably?  10 minutes    Patient Stated Goals  decrease pain    Pain Score  2    Worst 6/10 with walking   Pain Location  Ankle         [facility] PT Assessment - 03/12/20 0001      Assessment   Medical Diagnosis  R ankle/foot pain    Referring Provider (PT)  Wylene Simmer MD    Onset Date/Surgical Date  08/13/19    Next MD Visit  May 10    Prior Therapy  Yes      Precautions   Precautions  None      Restrictions   Weight Bearing Restrictions  No      Balance Screen   Has the patient fallen in the past 6 months  No    Has the patient had a decrease in activity level because of a fear of falling?   No    Is the patient reluctant to leave their home  because of a fear of falling?   No      Home Film/video editor residence    Home Access  Ramped entrance      Prior Function   Level of Independence  Independent    Vocation  Full time employment    Vocation Requirements  Currently off work, normally works 10-12 hrs of standing , pushing , pullling, lifting up to 25# various heights     Leisure  reading       Cognition   Overall Cognitive Status  Within Functional Limits for tasks assessed      Observation/Other Assessments   Observations  Patient ambulates without AD    Focus on Therapeutic Outcomes (FOTO)   59% limited      AROM   Right Ankle Dorsiflexion  7   lacking   Right Ankle Plantar Flexion  47    Right Ankle Inversion  15    Right Ankle Eversion  12   lacking   Left Ankle Dorsiflexion  7    Left Ankle Plantar Flexion  52    Left Ankle Inversion  28    Left Ankle Eversion  18      Strength   Right Ankle Dorsiflexion  4-/5    Right Ankle Plantar Flexion  4-/5    Right Ankle Inversion  4-/5    Right Ankle Eversion  4-/5      Palpation   Palpation comment  TTP, posterior tib tendon, gastroc/solues, dorsal surface of foot by previous surgical incision, navicular, throughout mid foot, anterior joint line      Ambulation/Gait   Ambulation/Gait  Yes    Ambulation/Gait Assistance  6: Modified independent (Device/Increase time)    Ambulation Distance (Feet)  275 Feet    Assistive device  None    Gait Pattern  Right foot flat;Antalgic    Ambulation Surface  Level;Indoor    Gait velocity  decreased    Gait Comments  2MWT, R flat foot at intial contact, early heel off, decreased stance time, antalgic                Objective measurements completed on examination: See above findings.      [facility] Adult PT Treatment/Exercise - 03/12/20 0001      Exercises   Exercises  Ankle      Ankle Exercises: Stretches  Gastroc Stretch  3 reps;30 seconds    Gastroc Stretch Limitations  at  wall             PT Education - 03/12/20 1044    Education Details  Patient educated on exam findings, POC, scope of PT,initial HEP, posterior tib dysfunction, arch support, proper footwear, diet and nutrition    Person(s) Educated  Patient    Methods  Explanation    Comprehension  Verbalized understanding       PT Short Term Goals - 03/12/20 1144      PT SHORT TERM GOAL #1   Title  Patient will be independent in self management strategies to improve quality of life and functional outcomes.    Time  3    Period  Weeks    Status  New    Target Date  04/02/20      PT SHORT TERM GOAL #2   Title  Patient will report at least 25% improvement in symptoms for improved quality of life.    Time  3    Period  Weeks    Status  New    Target Date  04/02/20        PT Long Term Goals - 03/12/20 1145      PT LONG TERM GOAL #1   Title  Patient will improve FOTO score by at least 10 points in order to indicate improved tolerance to activity.    Time  6    Period  Weeks    Status  New    Target Date  04/23/20      PT LONG TERM GOAL #2   Title  Patient will be able to ambulate for at least 60 minutes with pain no greater than 1/10 in order to demonstrate improved ability to ambulate in the community.    Time  6    Period  Weeks    Status  New    Target Date  04/23/20      PT LONG TERM GOAL #3   Title  Patient will report at least 75% improvement in symptoms for improved quality of life.    Time  6    Period  Weeks    Status  New    Target Date  04/23/20             Plan - 03/12/20 1130    Clinical Impression Statement  Patient is a 59 y.o. female who presents to physical therapy with c/o R ankle/foot pain. She presents with pain limited deficits in R ankle/foot strength, ROM, endurance, gait, balance, mobility and functional mobility with ADL. She is having to modify and restrict ADL as indicated by FOTO score as well as subjective information and objective measures  which is affecting overall participation. Patient educated extensively about exam findings, POC, scope of PT, initial HEP, posterior tib dysfunction, arch support, proper footwear, diet and nutrition, and weight loss for decreased load on foot/ankle. Patient may benefit from consult with dietitian and nutrition therapist at clinic. She is eager and motivated to complete therapy as well as make lifestyle changes for weight loss/healthier eating. Patient will benefit from skilled physical therapy in order to improve function and reduce impairment.    Personal Factors and Comorbidities  Past/Current Experience;Behavior Pattern;Time since onset of injury/illness/exacerbation;Comorbidity 3+    Comorbidities  htn, diabeters, obesity    Examination-Activity Limitations  Carry;Locomotion Level;Stand;Lift;Transfers;Stairs;Squat    Examination-Participation Restrictions  Metallurgist  Evolving/Moderate complexity    Clinical Decision Making  Moderate    Rehab Potential  Good    PT Frequency  2x / week    PT Duration  6 weeks    PT Treatment/Interventions  Gait training;Stair training;Functional mobility training;Therapeutic activities;Therapeutic exercise;Balance training;Manual techniques;Patient/family education;Passive range of motion;Aquatic Therapy;ADLs/Self Care Home Management;Biofeedback;Cryotherapy;Electrical Stimulation;Iontophoresis 4mg /ml Dexamethasone;Moist Heat;Traction;Ultrasound;Parrafin;Fluidtherapy;DME Instruction;Contrast Bath;Neuromuscular re-education;Orthotic Fit/Training;Manual lymph drainage;Compression bandaging;Scar mobilization;Dry needling;Energy conservation;Splinting;Taping;Vasopneumatic Device;Spinal Manipulations;Joint Manipulations    PT Next Visit Plan  Assess balance, begin ankle ROM and strengtheing as tolerated. Possibly begin manual therapy for pain relief and mobility, strengthen posterior tibialis    PT Home  Exercise Plan  4/14 Calf stretch, AROM    Consulted and Agree with Plan of Care  Patient       Patient will benefit from skilled therapeutic intervention in order to improve the following deficits and impairments:  Decreased activity tolerance, Decreased balance, Pain, Abnormal gait, Decreased range of motion, Decreased endurance, Decreased mobility, Difficulty walking, Decreased strength, Hypomobility, Impaired flexibility, Improper body mechanics  Visit Diagnosis: Pain in right ankle and joints of right foot  Stiffness of right ankle, not elsewhere classified  Muscle weakness (generalized)  Other abnormalities of gait and mobility     Problem List Patient Active Problem List   Diagnosis Date Noted  . ARF (acute renal failure) ([place]) 07/05/2019  . Abdominal pain 07/05/2019  . Enteritis 07/05/2019  . 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 Normocytic anemia 12/28/2016  . Postviral gastroparesis   . Type 2 diabetes mellitus ([place]) 10/23/2016  . AKI (acute kidney injury) ([place]) 10/23/2016  . IBS (irritable bowel syndrome) 10/12/2015  . Acute pancreatitis 10/10/2015  . Diarrhea 10/10/2015  . Non-intractable vomiting with nausea 10/10/2015  . Type 2 diabetes mellitus without complication ([place]) [id number]  . Labial abscess 10/10/2015  . Hyponatremia 10/10/2015  . Essential hypertension 10/10/2015  . Tobacco abuse 10/10/2015  . Bartholin cyst 07/03/2014  . Diabetes ([place]) 09/03/2013  . Obesity 09/03/2013  . Vaginal itching 09/03/2013  . Nicotine addiction 09/03/2013  . 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 TENOSYNOVITIS OF FOOT AND ANKLE 09/22/2010  . FOOT PAIN, BILATERAL 09/22/2010    11:48 AM, 03/12/20 Mearl Latin PT, DPT Physical Therapist at [place], [place], [address] Phone: [phone]   Fax:  [phone]  Name: Shawna Huerta MRN: [id number] Date of Birth:  22-Jan-1961

## 2020-03-17 ENCOUNTER — Ambulatory Visit (HOSPITAL_COMMUNITY): Payer: BC Managed Care – PPO | Admitting: Physical Therapy

## 2020-03-17 ENCOUNTER — Other Ambulatory Visit: Payer: Self-pay

## 2020-03-17 ENCOUNTER — Encounter (HOSPITAL_COMMUNITY): Payer: Self-pay | Admitting: Physical Therapy

## 2020-03-17 DIAGNOSIS — M25571 Pain in right ankle and joints of right foot: Secondary | ICD-10-CM | POA: Diagnosis not present

## 2020-03-17 DIAGNOSIS — M6281 Muscle weakness (generalized): Secondary | ICD-10-CM

## 2020-03-17 DIAGNOSIS — R2689 Other abnormalities of gait and mobility: Secondary | ICD-10-CM

## 2020-03-17 DIAGNOSIS — M25671 Stiffness of right ankle, not elsewhere classified: Secondary | ICD-10-CM

## 2020-03-17 NOTE — Therapy (Signed)
[place], [place], [address] Phone: [phone]   Fax:  [phone]  Physical Therapy Treatment  Patient Details  Name: Shawna Huerta MRN: [id number] Date of Birth: 04-24-1961 Referring Provider (PT): Wylene Simmer MD   Encounter Date: 03/17/2020  PT End of Session - 03/17/20 1012    Visit Number  2    Number of Visits  12    Date for PT Re-Evaluation  04/23/20    Authorization Type  BCBS (visit limit 60, no auth)    Authorization - Number of Visits  60    PT Start Time  [phone]    PT Stop Time  0920    PT Time Calculation (min)  45 min    Activity Tolerance  Patient tolerated treatment well    Behavior During Therapy  [facility] for tasks assessed/performed       Past Medical History:  Diagnosis Date  . Anemia   . Arthritis   . Bartholin cyst 07/03/2014  . Colitis 08/2015   Treated at [facility]  . Diabetes mellitus   . Hypertension   . Labial abscess 08/2015   Status post I&D by Dr. Ladona Horns  . Nicotine addiction 09/03/2013  . Obesity   . Pancreatitis 09/2015   unknown etiology, no further work-up with gastroenterology   . Vaginal itching 09/03/2013    Past Surgical History:  Procedure Laterality Date  . ABDOMINAL HYSTERECTOMY    . ABDOMINAL SURGERY     exploratry with TAH  . BALLOON DILATION N/A 10/26/2016   Procedure: Pyloric channel BALLOON DILATION;  Surgeon: Daneil Dolin, MD;  [facility];  Service: Endoscopy;  Laterality: N/A;  . BARTHOLIN GLAND CYST EXCISION Right 08/20/2014   Procedure: EXCISION OF RIGHT BARTHOLIN'S GLAND CYST;  Surgeon: Florian Buff, MD;  Location: [facility];  Service: Gynecology;  Laterality: Right;  . CHOLECYSTECTOMY    . COLONOSCOPY  10/2015   Doristine Mango: mild diverticulosis, at least 8 polpys removed, multiple tubular adenomas. next tcs in 1 year  . ESOPHAGOGASTRODUODENOSCOPY N/A 10/26/2016   Procedure: ESOPHAGOGASTRODUODENOSCOPY (EGD);  Surgeon: Daneil Dolin, MD;  [facility];  Service: Endoscopy;  Laterality: N/A;  . FOOT SURGERY    . FOOT SURGERY Right 2011   shave bone  . TONSILLECTOMY      There were no vitals filed for this visit.  Subjective Assessment - 03/17/20 0837    Subjective  Pt states her pain remains constant at 2/10 in Rt ankle.  States the gastroc slide surgery she had helped for about 9 years and now it is bothering her again.    Currently in Pain?  Yes    Pain Score  2     Pain Location  Ankle    Pain Orientation  Right    Pain Descriptors / Indicators  Aching;Tender    Pain Type  Chronic pain                       [facility] Adult PT Treatment/Exercise - 03/17/20 0001      Manual Therapy   Manual Therapy  Soft tissue mobilization    Manual therapy comments  completed seperately from all other skilled interventions at [facility]    Soft tissue mobilization  to Rt ankle with elevation      Ankle Exercises: Stretches   Plantar Fascia Stretch  3 reps;30 seconds   on step   Soleus  Stretch  2 reps;30 seconds;Limitations   at wall   Gastroc Stretch  3 reps;30 seconds    Gastroc Stretch Limitations  at wall      Ankle Exercises: Standing   SLS  max of 5" Rt: 12", Lt:18"    Heel Raises  Both;10 reps;Limitations   with pink ball between ankles   Toe Raise  10 reps    Other Standing Ankle Exercises  tandem stance Rt lead 30", Lt lead 30"      Ankle Exercises: Seated   Towel Crunch  Limitations   1 minute   Towel Inversion/Eversion  5 reps    Other Seated Ankle Exercises  Rt leg crossed AROM inversion 10X (no resistance)             PT Education - 03/17/20 1026    Education Details  reviewed goals and POC moving forward.  Reviewed HEP    Person(s) Educated  Patient    Methods  Explanation;Demonstration;Tactile cues;Verbal cues    Comprehension  Verbalized understanding;Returned demonstration;Verbal cues required       PT Short Term Goals - 03/17/20 [id number]      PT SHORT TERM GOAL  #1   Title  Patient will be independent in self management strategies to improve quality of life and functional outcomes.    Time  3    Period  Weeks    Status  On-going    Target Date  04/02/20      PT SHORT TERM GOAL #2   Title  Patient will report at least 25% improvement in symptoms for improved quality of life.    Time  3    Period  Weeks    Status  On-going    Target Date  04/02/20        PT Long Term Goals - 03/17/20 0854      PT LONG TERM GOAL #1   Title  Patient will improve FOTO score by at least 10 points in order to indicate improved tolerance to activity.    Time  6    Period  Weeks    Status  On-going      PT LONG TERM GOAL #2   Title  Patient will be able to ambulate for at least 60 minutes with pain no greater than 1/10 in order to demonstrate improved ability to ambulate in the community.    Time  6    Period  Weeks    Status  On-going      PT LONG TERM GOAL #3   Title  Patient will report at least 75% improvement in symptoms for improved quality of life.    Time  6    Period  Weeks    Status  On-going            Plan - 03/17/20 1026    Clinical Impression Statement  Reveiwed goals and POC moving forward. Initiated new therex to include stretching and strengthening for Rt ankle mm.  Pt with some discomfort completing inversion in seated position.  All other exercises with minimal discomfort.  SLS max on each LE 12" Rt, 18" Lt.  pt with no difficulty maintaining tandem gait for 30 second time.    Personal Factors and Comorbidities  Past/Current Experience;Behavior Pattern;Time since onset of injury/illness/exacerbation;Comorbidity 3+    Comorbidities  htn, diabeters, obesity    Examination-Activity Limitations  Carry;Locomotion Level;Stand;Lift;Transfers;Stairs;Squat    Examination-Participation Restrictions  Metallurgist  Evolving/Moderate  complexity    Rehab Potential  Good    PT  Frequency  2x / week    PT Duration  6 weeks    PT Treatment/Interventions  Gait training;Stair training;Functional mobility training;Therapeutic activities;Therapeutic exercise;Balance training;Manual techniques;Patient/family education;Passive range of motion;Aquatic Therapy;ADLs/Self Care Home Management;Biofeedback;Cryotherapy;Electrical Stimulation;Iontophoresis 4mg /ml Dexamethasone;Moist Heat;Traction;Ultrasound;Parrafin;Fluidtherapy;DME Instruction;Contrast Bath;Neuromuscular re-education;Orthotic Fit/Training;Manual lymph drainage;Compression bandaging;Scar mobilization;Dry needling;Energy conservation;Splinting;Taping;Vasopneumatic Device;Spinal Manipulations;Joint Manipulations    PT Next Visit Plan  Progress ankle ROM and strengtheing as tolerated. continue manual therapy for pain relief and mobility, strengthen posterior tibialis    PT Home Exercise Plan  4/14 Calf stretch, AROM    Consulted and Agree with Plan of Care  Patient       Patient will benefit from skilled therapeutic intervention in order to improve the following deficits and impairments:  Decreased activity tolerance, Decreased balance, Pain, Abnormal gait, Decreased range of motion, Decreased endurance, Decreased mobility, Difficulty walking, Decreased strength, Hypomobility, Impaired flexibility, Improper body mechanics  Visit Diagnosis: Pain in right ankle and joints of right foot  Stiffness of right ankle, not elsewhere classified  Muscle weakness (generalized)  Other abnormalities of gait and mobility     Problem List Patient Active Problem List   Diagnosis Date Noted  . ARF (acute renal failure) ([place]) 07/05/2019  . Abdominal pain 07/05/2019  . Enteritis 07/05/2019  . 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 Normocytic anemia 12/28/2016  . Postviral gastroparesis   . Type 2 diabetes mellitus ([place]) 10/23/2016  . AKI (acute kidney injury) ([place]) 10/23/2016  . IBS (irritable bowel syndrome) 10/12/2015  . Acute  pancreatitis 10/10/2015  . Diarrhea 10/10/2015  . Non-intractable vomiting with nausea 10/10/2015  . Type 2 diabetes mellitus without complication ([place]) [id number]  . Labial abscess 10/10/2015  . Hyponatremia 10/10/2015  . Essential hypertension 10/10/2015  . Tobacco abuse 10/10/2015  . Bartholin cyst 07/03/2014  . Diabetes ([place]) 09/03/2013  . Obesity 09/03/2013  . Vaginal itching 09/03/2013  . Nicotine addiction 09/03/2013  . 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 TENOSYNOVITIS OF FOOT AND ANKLE 09/22/2010  . FOOT PAIN, BILATERAL 09/22/2010   Teena Irani, PTA/CLT [phone]  Teena Irani 03/17/2020, 10:29 AM  [place], [place], [address] Phone: [phone]   Fax:  [phone]  Name: SHANAI VESSELL MRN: [id number] Date of Birth: 11-08-1961

## 2020-03-18 ENCOUNTER — Ambulatory Visit (HOSPITAL_COMMUNITY): Payer: BC Managed Care – PPO | Admitting: Physical Therapy

## 2020-03-18 ENCOUNTER — Encounter (HOSPITAL_COMMUNITY): Payer: Self-pay | Admitting: Physical Therapy

## 2020-03-18 DIAGNOSIS — R2689 Other abnormalities of gait and mobility: Secondary | ICD-10-CM

## 2020-03-18 DIAGNOSIS — M6281 Muscle weakness (generalized): Secondary | ICD-10-CM

## 2020-03-18 DIAGNOSIS — M25671 Stiffness of right ankle, not elsewhere classified: Secondary | ICD-10-CM

## 2020-03-18 DIAGNOSIS — M25571 Pain in right ankle and joints of right foot: Secondary | ICD-10-CM

## 2020-03-18 NOTE — Therapy (Signed)
[place], [place], [address] Phone: [phone]   Fax:  [phone]  Physical Therapy Treatment  Patient Details  Name: Shawna Huerta MRN: [id number] Date of Birth: 1961/07/27 Referring Provider (PT): Wylene Simmer MD   Encounter Date: 03/18/2020  PT End of Session - 03/18/20 1201    Visit Number  3    Number of Visits  12    Date for PT Re-Evaluation  04/23/20    Authorization Type  BCBS (visit limit 60, no auth)    Authorization - Number of Visits  60    PT Start Time  [phone]    PT Stop Time  0918    PT Time Calculation (min)  43 min    Activity Tolerance  Patient tolerated treatment well    Behavior During Therapy  [facility] for tasks assessed/performed       Past Medical History:  Diagnosis Date  . Anemia   . Arthritis   . Bartholin cyst 07/03/2014  . Colitis 08/2015   Treated at [facility]  . Diabetes mellitus   . Hypertension   . Labial abscess 08/2015   Status post I&D by Dr. Ladona Horns  . Nicotine addiction 09/03/2013  . Obesity   . Pancreatitis 09/2015   unknown etiology, no further work-up with gastroenterology   . Vaginal itching 09/03/2013    Past Surgical History:  Procedure Laterality Date  . ABDOMINAL HYSTERECTOMY    . ABDOMINAL SURGERY     exploratry with TAH  . BALLOON DILATION N/A 10/26/2016   Procedure: Pyloric channel BALLOON DILATION;  Surgeon: Daneil Dolin, MD;  [facility];  Service: Endoscopy;  Laterality: N/A;  . BARTHOLIN GLAND CYST EXCISION Right 08/20/2014   Procedure: EXCISION OF RIGHT BARTHOLIN'S GLAND CYST;  Surgeon: Florian Buff, MD;  Location: [facility];  Service: Gynecology;  Laterality: Right;  . CHOLECYSTECTOMY    . COLONOSCOPY  10/2015   Doristine Mango: mild diverticulosis, at least 8 polpys removed, multiple tubular adenomas. next tcs in 1 year  . ESOPHAGOGASTRODUODENOSCOPY N/A 10/26/2016   Procedure: ESOPHAGOGASTRODUODENOSCOPY (EGD);  Surgeon: Daneil Dolin, MD;  Location[facility];  Service: Endoscopy;  Laterality: N/A;  . FOOT SURGERY    . FOOT SURGERY Right 2011   shave bone  . TONSILLECTOMY      There were no vitals filed for this visit.  Subjective Assessment - 03/18/20 0840    Subjective  pt states it felt better after her session yesterday but is a little sore now.  Pain remains at 2/10    Currently in Pain?  Yes    Pain Score  2     Pain Location  Ankle    Pain Orientation  Right    Pain Descriptors / Indicators  Aching;Tender;Sore                       [facility] Adult PT Treatment/Exercise - 03/18/20 0001      Manual Therapy   Manual Therapy  Soft tissue mobilization    Manual therapy comments  completed seperately from all other skilled interventions at [facility]    Soft tissue mobilization  to Rt ankle with elevation      Ankle Exercises: Stretches   Plantar Fascia Stretch  3 reps;30 seconds    Soleus Stretch  2 reps;30 seconds;Limitations    Soleus Stretch Limitations  slant board    Gastroc Stretch  2 reps;30  seconds    Gastroc Stretch Limitations  slant board      Ankle Exercises: Standing   Heel Raises  Both;10 reps;Limitations   with pink ball between ankles   Toe Raise  10 reps      Ankle Exercises: Seated   Towel Crunch  Limitations   1 minute   Towel Inversion/Eversion  5 reps    BAPS  Level 3;Sitting;10 reps    Other Seated Ankle Exercises  Rt leg crossed AROM inversion 10X (no resistance)    Other Seated Ankle Exercises  pink ball squeeze at forefoot 10X5" holds               PT Short Term Goals - 03/17/20 [id number]      PT SHORT TERM GOAL #1   Title  Patient will be independent in self management strategies to improve quality of life and functional outcomes.    Time  3    Period  Weeks    Status  On-going    Target Date  04/02/20      PT SHORT TERM GOAL #2   Title  Patient will report at least 25% improvement in symptoms for improved quality of life.    Time  3     Period  Weeks    Status  On-going    Target Date  04/02/20        PT Long Term Goals - 03/17/20 0854      PT LONG TERM GOAL #1   Title  Patient will improve FOTO score by at least 10 points in order to indicate improved tolerance to activity.    Time  6    Period  Weeks    Status  On-going      PT LONG TERM GOAL #2   Title  Patient will be able to ambulate for at least 60 minutes with pain no greater than 1/10 in order to demonstrate improved ability to ambulate in the community.    Time  6    Period  Weeks    Status  On-going      PT LONG TERM GOAL #3   Title  Patient will report at least 75% improvement in symptoms for improved quality of life.    Time  6    Period  Weeks    Status  On-going            Plan - 03/18/20 1202    Clinical Impression Statement  pt with a little soreness today so focused on established POC with only addition of BAPS for ROM.  pt able to manuever with good control using level 3 with most difficulty with eversion/inversion.  Finished session with manual to mobilize tissue and reduce pain in Rt ankle. Pt reported feeling much better with only a trace of pain at end of session.    Personal Factors and Comorbidities  Past/Current Experience;Behavior Pattern;Time since onset of injury/illness/exacerbation;Comorbidity 3+    Comorbidities  htn, diabeters, obesity    Examination-Activity Limitations  Carry;Locomotion Level;Stand;Lift;Transfers;Stairs;Squat    Examination-Participation Restrictions  Yard Work;Community Activity;Cleaning    Stability/Clinical Decision Making  Evolving/Moderate complexity    Rehab Potential  Good    PT Frequency  2x / week    PT Duration  6 weeks    PT Treatment/Interventions  Gait training;Stair training;Functional mobility training;Therapeutic activities;Therapeutic exercise;Balance training;Manual techniques;Patient/family education;Passive range of motion;Aquatic Therapy;ADLs/Self Care Home  Management;Biofeedback;Cryotherapy;Electrical Stimulation;Iontophoresis 4mg /ml Dexamethasone;Moist Heat;Traction;Ultrasound;Parrafin;Fluidtherapy;DME Instruction;Contrast Bath;Neuromuscular re-education;Orthotic Fit/Training;Manual lymph drainage;Compression bandaging;Scar mobilization;Dry needling;Energy conservation;Splinting;Taping;Vasopneumatic  Device;Spinal Manipulations;Joint Manipulations    PT Next Visit Plan  Progress ankle ROM and strengtheing as tolerated. continue manual therapy for pain relief and mobility, strengthen posterior tibialis    PT Home Exercise Plan  4/14 Calf stretch, AROM    Consulted and Agree with Plan of Care  Patient       Patient will benefit from skilled therapeutic intervention in order to improve the following deficits and impairments:  Decreased activity tolerance, Decreased balance, Pain, Abnormal gait, Decreased range of motion, Decreased endurance, Decreased mobility, Difficulty walking, Decreased strength, Hypomobility, Impaired flexibility, Improper body mechanics  Visit Diagnosis: Muscle weakness (generalized)  Stiffness of right ankle, not elsewhere classified  Pain in right ankle and joints of right foot  Other abnormalities of gait and mobility     Problem List Patient Active Problem List   Diagnosis Date Noted  . ARF (acute renal failure) ([place]) 07/05/2019  . Abdominal pain 07/05/2019  . Enteritis 07/05/2019  . 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 Normocytic anemia 12/28/2016  . Postviral gastroparesis   . Type 2 diabetes mellitus ([place]) 10/23/2016  . AKI (acute kidney injury) ([place]) 10/23/2016  . IBS (irritable bowel syndrome) 10/12/2015  . Acute pancreatitis 10/10/2015  . Diarrhea 10/10/2015  . Non-intractable vomiting with nausea 10/10/2015  . Type 2 diabetes mellitus without complication ([place]) [id number]  . Labial abscess 10/10/2015  . Hyponatremia 10/10/2015  . Essential hypertension 10/10/2015  . Tobacco abuse 10/10/2015  . Bartholin  cyst 07/03/2014  . Diabetes ([place]) 09/03/2013  . Obesity 09/03/2013  . Vaginal itching 09/03/2013  . Nicotine addiction 09/03/2013  . 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 TENOSYNOVITIS OF FOOT AND ANKLE 09/22/2010  . FOOT PAIN, BILATERAL 09/22/2010   Shawna Huerta, Shawna Huerta [phone]  Shawna Huerta 03/18/2020, 12:05 PM  [place], [place], [address] Phone: [phone]   Fax:  [phone]  Name: AAZIYAH NEGRON MRN: [id number] Date of Birth: [birth year]

## 2020-03-23 ENCOUNTER — Ambulatory Visit (HOSPITAL_COMMUNITY): Payer: BC Managed Care – PPO | Admitting: Physical Therapy

## 2020-03-23 ENCOUNTER — Telehealth (HOSPITAL_COMMUNITY): Payer: Self-pay | Admitting: Physical Therapy

## 2020-03-23 NOTE — Telephone Encounter (Signed)
Patient called to cx she is not feeling well today.

## 2020-04-01 ENCOUNTER — Other Ambulatory Visit: Payer: Self-pay

## 2020-04-01 ENCOUNTER — Ambulatory Visit (HOSPITAL_COMMUNITY): Payer: BC Managed Care – PPO | Attending: Orthopedic Surgery | Admitting: Physical Therapy

## 2020-04-01 ENCOUNTER — Encounter (HOSPITAL_COMMUNITY): Payer: Self-pay | Admitting: Physical Therapy

## 2020-04-01 DIAGNOSIS — M25671 Stiffness of right ankle, not elsewhere classified: Secondary | ICD-10-CM | POA: Diagnosis present

## 2020-04-01 DIAGNOSIS — M6281 Muscle weakness (generalized): Secondary | ICD-10-CM | POA: Insufficient documentation

## 2020-04-01 DIAGNOSIS — R2689 Other abnormalities of gait and mobility: Secondary | ICD-10-CM | POA: Diagnosis present

## 2020-04-01 DIAGNOSIS — M25571 Pain in right ankle and joints of right foot: Secondary | ICD-10-CM | POA: Insufficient documentation

## 2020-04-01 NOTE — Therapy (Signed)
Shawna Huerta, [place], [address] Phone: [phone]   Fax:  [phone]  Physical Therapy Treatment  Patient Details  Name: Shawna Huerta MRN: [id number] Date of Birth: 06/22/1961 Referring Provider (PT): Wylene Simmer MD   Encounter Date: 04/01/2020  PT End of Session - 04/01/20 0905    Visit Number  4    Number of Visits  12    Date for PT Re-Evaluation  04/23/20    Authorization Type  BCBS (visit limit 60, no auth)    Authorization - Number of Visits  60    PT Start Time  0901    PT Stop Time  0941    PT Time Calculation (min)  40 min    Activity Tolerance  Patient tolerated treatment well    Behavior During Therapy  [facility] for tasks assessed/performed       Past Medical History:  Diagnosis Date  . Anemia   . Arthritis   . Bartholin cyst 07/03/2014  . Colitis 08/2015   Treated at [facility]  . Diabetes mellitus   . Hypertension   . Labial abscess 08/2015   Status post I&D by Dr. Ladona Horns  . Nicotine addiction 09/03/2013  . Obesity   . Pancreatitis 09/2015   unknown etiology, no further work-up with gastroenterology   . Vaginal itching 09/03/2013    Past Surgical History:  Procedure Laterality Date  . ABDOMINAL HYSTERECTOMY    . ABDOMINAL SURGERY     exploratry with TAH  . BALLOON DILATION N/A 10/26/2016   Procedure: Pyloric channel BALLOON DILATION;  Surgeon: Daneil Dolin, MD;  [facility];  Service: Endoscopy;  Laterality: N/A;  . BARTHOLIN GLAND CYST EXCISION Right 08/20/2014   Procedure: EXCISION OF RIGHT BARTHOLIN'S GLAND CYST;  Surgeon: Florian Buff, MD;  Location: [facility];  Service: Gynecology;  Laterality: Right;  . CHOLECYSTECTOMY    . COLONOSCOPY  10/2015   Doristine Mango: mild diverticulosis, at least 8 polpys removed, multiple tubular adenomas. next tcs in 1 year  . ESOPHAGOGASTRODUODENOSCOPY N/A 10/26/2016   Procedure: ESOPHAGOGASTRODUODENOSCOPY (EGD);  Surgeon: Daneil Dolin, MD;  [facility];  Service: Endoscopy;  Laterality: N/A;  . FOOT SURGERY    . FOOT SURGERY Right 2011   shave bone  . TONSILLECTOMY      There were no vitals filed for this visit.  Subjective Assessment - 04/01/20 0901    Subjective  Patient states she has been doing like usual. She thinks things are getting a little better. Her home exercises are going well.    Currently in Pain?  Yes    Pain Score  1    worst 4/10   Pain Location  Ankle    Pain Orientation  Right         [facility] PT Assessment - 04/01/20 0001      AROM   Right Ankle Dorsiflexion  8   lacking; improves to lacking 3 following manual therapy                  [facility] Adult PT Treatment/Exercise - 04/01/20 0001      Manual Therapy   Manual Therapy  Joint mobilization    Manual therapy comments  completed seperately from all other skilled interventions   Joint Mobilization  Grade III AP talocrual mobilzations with DF overpressure      Ankle Exercises: Stretches   Soleus Stretch  2 reps;30 seconds;Limitations  Soleus Stretch Limitations  slant board    Gastroc Stretch  3 reps;30 seconds    Gastroc Stretch Limitations  slant board      Ankle Exercises: Seated   Other Seated Ankle Exercises  arch formation 10x 5 second holds    Other Seated Ankle Exercises  inversion isometrics with manual resistance 10 second hold x 10, using other foot for resistance 10 x 10 second hold      Ankle Exercises: Standing   SLS  2x30 seconds bilateral    Heel Raises  10 reps;Both    Heel Raises Limitations  with posterior tib activation             PT Education - 04/01/20 0905    Education Details  Patient educated on continuing HEP, review of HEP    Person(s) Educated  Patient    Methods  Explanation;Demonstration    Comprehension  Verbalized understanding;Returned demonstration       PT Short Term Goals - 03/17/20 0852      PT SHORT TERM GOAL #1   Title  Patient will be  independent in self management strategies to improve quality of life and functional outcomes.    Time  3    Period  Weeks    Status  On-going    Target Date  04/02/20      PT SHORT TERM GOAL #2   Title  Patient will report at least 25% improvement in symptoms for improved quality of life.    Time  3    Period  Weeks    Status  On-going    Target Date  04/02/20        PT Long Term Goals - 03/17/20 0854      PT LONG TERM GOAL #1   Title  Patient will improve FOTO score by at least 10 points in order to indicate improved tolerance to activity.    Time  6    Period  Weeks    Status  On-going      PT LONG TERM GOAL #2   Title  Patient will be able to ambulate for at least 60 minutes with pain no greater than 1/10 in order to demonstrate improved ability to ambulate in the community.    Time  6    Period  Weeks    Status  On-going      PT LONG TERM GOAL #3   Title  Patient will report at least 75% improvement in symptoms for improved quality of life.    Time  6    Period  Weeks    Status  On-going            Plan - 04/01/20 0906    Clinical Impression Statement  Patient tolerates manual therapy for improving R ankle ROM well with min/mod discomfort and demonstrates improving ankle dorsiflexion ROM following.  Patient demonstrates impaired motor control and weakness with intrinsic foot strengthening exercises. She requires frequent verbal and tactile cueing for inversion isometric exercise. She is educated on appropriate levels of pain with exercise. She tolerates heel raises with posterior tib activation with moderate increase in symptoms. She demonstrates impaired static balance but improving ankle/foot strategies. Patient will continue to benefit from skilled physical therapy in order to improve function and reduce impairment.    Personal Factors and Comorbidities  Past/Current Experience;Behavior Pattern;Time since onset of injury/illness/exacerbation;Comorbidity 3+     Comorbidities  htn, diabeters, obesity    Examination-Activity Limitations  Carry;Locomotion Level;Stand;Lift;Transfers;Stairs;Squat  Examination-Participation Restrictions  Yard Work;Community Activity;Cleaning    Stability/Clinical Decision Making  Evolving/Moderate complexity    Rehab Potential  Good    PT Frequency  2x / week    PT Duration  6 weeks    PT Treatment/Interventions  Gait training;Stair training;Functional mobility training;Therapeutic activities;Therapeutic exercise;Balance training;Manual techniques;Patient/family education;Passive range of motion;Aquatic Therapy;ADLs/Self Care Home Management;Biofeedback;Cryotherapy;Electrical Stimulation;Iontophoresis 4mg /ml Dexamethasone;Moist Heat;Traction;Ultrasound;Parrafin;Fluidtherapy;DME Instruction;Contrast Bath;Neuromuscular re-education;Orthotic Fit/Training;Manual lymph drainage;Compression bandaging;Scar mobilization;Dry needling;Energy conservation;Splinting;Taping;Vasopneumatic Device;Spinal Manipulations;Joint Manipulations    PT Next Visit Plan  Progress ankle ROM and strengtheing as tolerated. continue manual therapy for pain relief and mobility, strengthen posterior tibialis    PT Home Exercise Plan  4/14 Calf stretch, AROM 04/01/20 heel raises, inversion isometric, arch formation    Consulted and Agree with Plan of Care  Patient       Patient will benefit from skilled therapeutic intervention in order to improve the following deficits and impairments:  Decreased activity tolerance, Decreased balance, Pain, Abnormal gait, Decreased range of motion, Decreased endurance, Decreased mobility, Difficulty walking, Decreased strength, Hypomobility, Impaired flexibility, Improper body mechanics  Visit Diagnosis: Muscle weakness (generalized)  Stiffness of right ankle, not elsewhere classified  Pain in right ankle and joints of right foot  Other abnormalities of gait and mobility     Problem List Patient Active Problem List    Diagnosis Date Noted  . ARF (acute renal failure) ([place]) 07/05/2019  . Abdominal pain 07/05/2019  . Enteritis 07/05/2019  . 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 Normocytic anemia 12/28/2016  . Postviral gastroparesis   . Type 2 diabetes mellitus ([place]) 10/23/2016  . AKI (acute kidney injury) ([place]) 10/23/2016  . IBS (irritable bowel syndrome) 10/12/2015  . Acute pancreatitis 10/10/2015  . Diarrhea 10/10/2015  . Non-intractable vomiting with nausea 10/10/2015  . Type 2 diabetes mellitus without complication ([place]) [id number]  . Labial abscess 10/10/2015  . Hyponatremia 10/10/2015  . Essential hypertension 10/10/2015  . Tobacco abuse 10/10/2015  . Bartholin cyst 07/03/2014  . Diabetes ([place]) 09/03/2013  . Obesity 09/03/2013  . Vaginal itching 09/03/2013  . Nicotine addiction 09/03/2013  . 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 TENOSYNOVITIS OF FOOT AND ANKLE 09/22/2010  . FOOT PAIN, BILATERAL 09/22/2010    9:48 AM, 04/01/20 Mearl Latin PT, DPT Physical Therapist at [place], [place], [address] Phone: [phone]   Fax:  [phone]  Name: Shawna Huerta MRN: [id number] Date of Birth: 1961/05/15

## 2020-04-01 NOTE — Patient Instructions (Signed)
Access Code: [email or web address] URL: [email or web address] Date: 04/01/2020 Prepared by: Mitzi Hansen Sonia Stickels  Exercises Seated Ankle Inversion AROM - 1 x daily - 7 x weekly - 1 sets - 10 reps - 5-10 second hold Seated Arch Lifts - 1 x daily - 7 x weekly - 10 reps - 5-10 second holds hold Heel rises with counter support - 1 x daily - 7 x weekly - 10 reps

## 2020-04-08 ENCOUNTER — Other Ambulatory Visit: Payer: Self-pay

## 2020-04-08 ENCOUNTER — Ambulatory Visit (HOSPITAL_COMMUNITY): Payer: BC Managed Care – PPO | Admitting: Physical Therapy

## 2020-04-08 ENCOUNTER — Encounter (HOSPITAL_COMMUNITY): Payer: Self-pay | Admitting: Physical Therapy

## 2020-04-08 DIAGNOSIS — M6281 Muscle weakness (generalized): Secondary | ICD-10-CM

## 2020-04-08 DIAGNOSIS — M25671 Stiffness of right ankle, not elsewhere classified: Secondary | ICD-10-CM

## 2020-04-08 DIAGNOSIS — M25571 Pain in right ankle and joints of right foot: Secondary | ICD-10-CM

## 2020-04-08 DIAGNOSIS — R2689 Other abnormalities of gait and mobility: Secondary | ICD-10-CM

## 2020-04-08 NOTE — Therapy (Signed)
[place], [place], [address] Phone: [phone]   Fax:  [phone]  Physical Therapy Treatment  Patient Details  Name: Shawna Huerta MRN: [id number] Date of Birth: 1961-09-05 Referring Provider (PT): Wylene Simmer MD   Encounter Date: 04/08/2020  PT End of Session - 04/08/20 0907    Visit Number  5    Number of Visits  12    Date for PT Re-Evaluation  04/23/20    Authorization Type  BCBS (visit limit 60, no auth)    Authorization - Number of Visits  60    PT Start Time  0900    PT Stop Time  0940    PT Time Calculation (min)  40 min    Activity Tolerance  Patient tolerated treatment well    Behavior During Therapy  [facility] for tasks assessed/performed       Past Medical History:  Diagnosis Date  . Anemia   . Arthritis   . Bartholin cyst 07/03/2014  . Colitis 08/2015   Treated at [facility]  . Diabetes mellitus   . Hypertension   . Labial abscess 08/2015   Status post I&D by Dr. Ladona Horns  . Nicotine addiction 09/03/2013  . Obesity   . Pancreatitis 09/2015   unknown etiology, no further work-up with gastroenterology   . Vaginal itching 09/03/2013    Past Surgical History:  Procedure Laterality Date  . ABDOMINAL HYSTERECTOMY    . ABDOMINAL SURGERY     exploratry with TAH  . BALLOON DILATION N/A 10/26/2016   Procedure: Pyloric channel BALLOON DILATION;  Surgeon: Daneil Dolin, MD;  [facility];  Service: Endoscopy;  Laterality: N/A;  . BARTHOLIN GLAND CYST EXCISION Right 08/20/2014   Procedure: EXCISION OF RIGHT BARTHOLIN'S GLAND CYST;  Surgeon: Florian Buff, MD;  Location: [facility];  Service: Gynecology;  Laterality: Right;  . CHOLECYSTECTOMY    . COLONOSCOPY  10/2015   Doristine Mango: mild diverticulosis, at least 8 polpys removed, multiple tubular adenomas. next tcs in 1 year  . ESOPHAGOGASTRODUODENOSCOPY N/A 10/26/2016   Procedure: ESOPHAGOGASTRODUODENOSCOPY (EGD);  Surgeon: Daneil Dolin, MD;  [facility];  Service: Endoscopy;  Laterality: N/A;  . FOOT SURGERY    . FOOT SURGERY Right 2011   shave bone  . TONSILLECTOMY      There were no vitals filed for this visit.  Subjective Assessment - 04/08/20 0859    Subjective  Patient reports she was doing better until today and now she is sore today. She iced and elevated for a while after last session. She was very sore with doing exercises the next. Her exercises are going well.    Currently in Pain?  Yes    Pain Score  4     Pain Location  Ankle    Pain Orientation  Right         [facility] PT Assessment - 04/08/20 0001      AROM   Right Ankle Dorsiflexion  5   lacking; lacking 2 following manual therapy                  [facility] Adult PT Treatment/Exercise - 04/08/20 0001      Manual Therapy   Manual Therapy  Joint mobilization    Manual therapy comments  completed seperately from all other skilled interventions   Joint Mobilization  Grade III AP talocrual mobilzations with DF overpressure      Ankle  Exercises: Seated   Other Seated Ankle Exercises  arch formation 10x 5 second holds    Other Seated Ankle Exercises  inversion isometrics using other foot for resistance 10 x 10 second hold; AROM inversion with red band resistance 2x10       Ankle Exercises: Standing   Heel Raises  10 reps;Both    Heel Raises Limitations  with posterior tib activation             PT Education - 04/08/20 0906    Education Details  Patient educated on continuing HEP, review of HEP, acceptable levels of pain, arch support for shoes    Person(s) Educated  Patient    Methods  Explanation;Demonstration    Comprehension  Verbalized understanding;Returned demonstration       PT Short Term Goals - 03/17/20 [id number]      PT SHORT TERM GOAL #1   Title  Patient will be independent in self management strategies to improve quality of life and functional outcomes.    Time  3    Period  Weeks    Status   On-going    Target Date  04/02/20      PT SHORT TERM GOAL #2   Title  Patient will report at least 25% improvement in symptoms for improved quality of life.    Time  3    Period  Weeks    Status  On-going    Target Date  04/02/20        PT Long Term Goals - 03/17/20 0854      PT LONG TERM GOAL #1   Title  Patient will improve FOTO score by at least 10 points in order to indicate improved tolerance to activity.    Time  6    Period  Weeks    Status  On-going      PT LONG TERM GOAL #2   Title  Patient will be able to ambulate for at least 60 minutes with pain no greater than 1/10 in order to demonstrate improved ability to ambulate in the community.    Time  6    Period  Weeks    Status  On-going      PT LONG TERM GOAL #3   Title  Patient will report at least 75% improvement in symptoms for improved quality of life.    Time  6    Period  Weeks    Status  On-going            Plan - 04/08/20 0907    Clinical Impression Statement  Patient demonstrates limited carry over with newly acquired ROM following last session but does demonstrate improved ROM following session. Patient educated on appropriate levels of pain with exercise and she is also educated on proper footwear and insoles for support to help with decreasing pain. She shows improving motor control with arch formation exercise. She has increasing pain with resisted inversion and heel raises but is able to complete with good posterior tibialis activation. Patient will continue to benefit from skilled physical therapy in order to reduce impairment and improve function.    Personal Factors and Comorbidities  Past/Current Experience;Behavior Pattern;Time since onset of injury/illness/exacerbation;Comorbidity 3+    Comorbidities  htn, diabeters, obesity    Examination-Activity Limitations  Carry;Locomotion Level;Stand;Lift;Transfers;Stairs;Squat    Examination-Participation Restrictions  Yard Work;Community  Activity;Cleaning    Stability/Clinical Decision Making  Evolving/Moderate complexity    Rehab Potential  Good    PT Frequency  2x /  week    PT Duration  6 weeks    PT Treatment/Interventions  Gait training;Stair training;Functional mobility training;Therapeutic activities;Therapeutic exercise;Balance training;Manual techniques;Patient/family education;Passive range of motion;Aquatic Therapy;ADLs/Self Care Home Management;Biofeedback;Cryotherapy;Electrical Stimulation;Iontophoresis 4mg /ml Dexamethasone;Moist Heat;Traction;Ultrasound;Parrafin;Fluidtherapy;DME Instruction;Contrast Bath;Neuromuscular re-education;Orthotic Fit/Training;Manual lymph drainage;Compression bandaging;Scar mobilization;Dry needling;Energy conservation;Splinting;Taping;Vasopneumatic Device;Spinal Manipulations;Joint Manipulations    PT Next Visit Plan  Progress ankle ROM and strengtheing as tolerated. continue manual therapy for pain relief and mobility, strengthen posterior tibialis    PT Home Exercise Plan  4/14 Calf stretch, AROM 04/01/20 heel raises, inversion isometric, arch formation    Consulted and Agree with Plan of Care  Patient       Patient will benefit from skilled therapeutic intervention in order to improve the following deficits and impairments:  Decreased activity tolerance, Decreased balance, Pain, Abnormal gait, Decreased range of motion, Decreased endurance, Decreased mobility, Difficulty walking, Decreased strength, Hypomobility, Impaired flexibility, Improper body mechanics  Visit Diagnosis: Muscle weakness (generalized)  Stiffness of right ankle, not elsewhere classified  Pain in right ankle and joints of right foot  Other abnormalities of gait and mobility     Problem List Patient Active Problem List   Diagnosis Date Noted  . ARF (acute renal failure) ([place]) 07/05/2019  . Abdominal pain 07/05/2019  . Enteritis 07/05/2019  . 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 Normocytic anemia 12/28/2016  .  Postviral gastroparesis   . Type 2 diabetes mellitus ([place]) 10/23/2016  . AKI (acute kidney injury) ([place]) 10/23/2016  . IBS (irritable bowel syndrome) 10/12/2015  . Acute pancreatitis 10/10/2015  . Diarrhea 10/10/2015  . Non-intractable vomiting with nausea 10/10/2015  . Type 2 diabetes mellitus without complication ([place]) [id number]  . Labial abscess 10/10/2015  . Hyponatremia 10/10/2015  . Essential hypertension 10/10/2015  . Tobacco abuse 10/10/2015  . Bartholin cyst 07/03/2014  . Diabetes ([place]) 09/03/2013  . Obesity 09/03/2013  . Vaginal itching 09/03/2013  . Nicotine addiction 09/03/2013  . 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 TENOSYNOVITIS OF FOOT AND ANKLE 09/22/2010  . FOOT PAIN, BILATERAL 09/22/2010    9:44 AM, 04/08/20 Mearl Latin PT, DPT Physical Therapist at [place], [place], [address] Phone: [phone]   Fax:  [phone]  Name: KABREA SEESE MRN: [id number] Date of Birth: [birth year]

## 2020-04-09 ENCOUNTER — Ambulatory Visit (HOSPITAL_COMMUNITY): Payer: BC Managed Care – PPO | Admitting: Physical Therapy

## 2020-04-09 ENCOUNTER — Encounter (HOSPITAL_COMMUNITY): Payer: Self-pay | Admitting: Physical Therapy

## 2020-04-09 DIAGNOSIS — M6281 Muscle weakness (generalized): Secondary | ICD-10-CM | POA: Diagnosis not present

## 2020-04-09 DIAGNOSIS — R2689 Other abnormalities of gait and mobility: Secondary | ICD-10-CM

## 2020-04-09 DIAGNOSIS — M25671 Stiffness of right ankle, not elsewhere classified: Secondary | ICD-10-CM

## 2020-04-09 DIAGNOSIS — M25571 Pain in right ankle and joints of right foot: Secondary | ICD-10-CM

## 2020-04-09 NOTE — Therapy (Signed)
[place], [place], [address] Phone: [phone]   Fax:  [phone]  Physical Therapy Treatment  Patient Details  Name: Shawna Huerta MRN: [id number] Date of Birth: 1961-01-24 Referring Provider (PT): Wylene Simmer MD   Encounter Date: 04/09/2020  PT End of Session - 04/09/20 0908    Visit Number  6    Number of Visits  12    Date for PT Re-Evaluation  04/23/20    Authorization Type  BCBS (visit limit 60, no auth)    Authorization - Number of Visits  60    PT Start Time  0905    PT Stop Time  0945    PT Time Calculation (min)  40 min    Activity Tolerance  Patient tolerated treatment well    Behavior During Therapy  [facility] for tasks assessed/performed       Past Medical History:  Diagnosis Date  . Anemia   . Arthritis   . Bartholin cyst 07/03/2014  . Colitis 08/2015   Treated at [facility]  . Diabetes mellitus   . Hypertension   . Labial abscess 08/2015   Status post I&D by Dr. Ladona Horns  . Nicotine addiction 09/03/2013  . Obesity   . Pancreatitis 09/2015   unknown etiology, no further work-up with gastroenterology   . Vaginal itching 09/03/2013    Past Surgical History:  Procedure Laterality Date  . ABDOMINAL HYSTERECTOMY    . ABDOMINAL SURGERY     exploratry with TAH  . BALLOON DILATION N/A 10/26/2016   Procedure: Pyloric channel BALLOON DILATION;  Surgeon: Daneil Dolin, MD;  [facility];  Service: Endoscopy;  Laterality: N/A;  . BARTHOLIN GLAND CYST EXCISION Right 08/20/2014   Procedure: EXCISION OF RIGHT BARTHOLIN'S GLAND CYST;  Surgeon: Florian Buff, MD;  Location: [facility];  Service: Gynecology;  Laterality: Right;  . CHOLECYSTECTOMY    . COLONOSCOPY  10/2015   Doristine Mango: mild diverticulosis, at least 8 polpys removed, multiple tubular adenomas. next tcs in 1 year  . ESOPHAGOGASTRODUODENOSCOPY N/A 10/26/2016   Procedure: ESOPHAGOGASTRODUODENOSCOPY (EGD);  Surgeon: Daneil Dolin, MD;  [facility];  Service: Endoscopy;  Laterality: N/A;  . FOOT SURGERY    . FOOT SURGERY Right 2011   shave bone  . TONSILLECTOMY      There were no vitals filed for this visit.  Subjective Assessment - 04/09/20 0905    Subjective  Patient states her foot was achy yesterday but she iced and elevated for 2 hours which helped. she was able to order arch supports.    Currently in Pain?  Yes    Pain Score  2     Pain Location  Ankle         [facility] PT Assessment - 04/09/20 0001      AROM   Right Ankle Dorsiflexion  2   lacking; improves to 1 following manual therapy                   [facility] Adult PT Treatment/Exercise - 04/09/20 0001      Manual Therapy   Manual Therapy  Joint mobilization    Manual therapy comments  completed seperately from all other skilled interventions at [facility]    Joint Mobilization  Grade III AP talocrual mobilzations with DF overpressure      Ankle Exercises: Seated   Other Seated Ankle Exercises  AROM inversion with red band resistance 2x10  Ankle Exercises: Standing   SLS  2x30 seconds bilateral    Heel Raises  10 reps;Both    Heel Raises Limitations  with posterior tib activation    Toe Raise  10 reps   2 sets   Other Standing Ankle Exercises  tandem stance 2x30 seconds bilateral    Other Standing Ankle Exercises  tandem balance on blue foam 2x30 seconds bilateral              PT Education - 04/09/20 0908    Education Details  Patient educated on continuing HEP, review of HEP, beginning walking program    Person(s) Educated  Patient    Methods  Explanation;Demonstration;Handout    Comprehension  Verbalized understanding;Returned demonstration       PT Short Term Goals - 04/09/20 0933      PT SHORT TERM GOAL #1   Title  Patient will be independent in self management strategies to improve quality of life and functional outcomes.    Time  3    Period  Weeks    Status  Achieved    Target Date   04/02/20      PT SHORT TERM GOAL #2   Title  Patient will report at least 25% improvement in symptoms for improved quality of life.    Time  3    Period  Weeks    Status  Achieved    Target Date  04/02/20        PT Long Term Goals - 03/17/20 0854      PT LONG TERM GOAL #1   Title  Patient will improve FOTO score by at least 10 points in order to indicate improved tolerance to activity.    Time  6    Period  Weeks    Status  On-going      PT LONG TERM GOAL #2   Title  Patient will be able to ambulate for at least 60 minutes with pain no greater than 1/10 in order to demonstrate improved ability to ambulate in the community.    Time  6    Period  Weeks    Status  On-going      PT LONG TERM GOAL #3   Title  Patient will report at least 75% improvement in symptoms for improved quality of life.    Time  6    Period  Weeks    Status  On-going            Plan - 04/09/20 0908    Clinical Impression Statement  Patient demonstrates carry over in DF ROM from yesterday and improves further with manual therapy today. She tolerates ankle inversion with resistance today with moderate increase in symptoms that does not decrease immediately upon rest. She demonstrates improving posterior tibialis activation upon initial heel raises with ability to increase height of raise today but height decreases with fatigue. She requires min verbal cueing for muscle activation and limit rocking body with toe raises in standing for DF muscle activation. She is limited with static balance today on RLE and requires intermittent UE support secondary to impaired balance, not pain. Patient will continue to benefit from skilled physical therapy in order to improve function and reduce impairment.    Personal Factors and Comorbidities  Past/Current Experience;Behavior Pattern;Time since onset of injury/illness/exacerbation;Comorbidity 3+    Comorbidities  htn, diabeters, obesity    Examination-Activity  Limitations  Carry;Locomotion Level;Stand;Lift;Transfers;Stairs;Squat    Examination-Participation Restrictions  Management consultant  Stability/Clinical Decision Making  Evolving/Moderate complexity    Rehab Potential  Good    PT Frequency  2x / week    PT Duration  6 weeks    PT Treatment/Interventions  Gait training;Stair training;Functional mobility training;Therapeutic activities;Therapeutic exercise;Balance training;Manual techniques;Patient/family education;Passive range of motion;Aquatic Therapy;ADLs/Self Care Home Management;Biofeedback;Cryotherapy;Electrical Stimulation;Iontophoresis 4mg /ml Dexamethasone;Moist Heat;Traction;Ultrasound;Parrafin;Fluidtherapy;DME Instruction;Contrast Bath;Neuromuscular re-education;Orthotic Fit/Training;Manual lymph drainage;Compression bandaging;Scar mobilization;Dry needling;Energy conservation;Splinting;Taping;Vasopneumatic Device;Spinal Manipulations;Joint Manipulations    PT Next Visit Plan  Progress ankle ROM and strengtheing as tolerated. continue manual therapy for pain relief and mobility, strengthen posterior tibialis    PT Home Exercise Plan  4/14 Calf stretch, AROM 04/01/20 heel raises, inversion isometric, arch formation 5/13 SLS, ankle inversion with resistance    Consulted and Agree with Plan of Care  Patient       Patient will benefit from skilled therapeutic intervention in order to improve the following deficits and impairments:  Decreased activity tolerance, Decreased balance, Pain, Abnormal gait, Decreased range of motion, Decreased endurance, Decreased mobility, Difficulty walking, Decreased strength, Hypomobility, Impaired flexibility, Improper body mechanics  Visit Diagnosis: Muscle weakness (generalized)  Stiffness of right ankle, not elsewhere classified  Pain in right ankle and joints of right foot  Other abnormalities of gait and mobility     Problem List Patient Active Problem List   Diagnosis Date  Noted  . ARF (acute renal failure) ([place]) 07/05/2019  . Abdominal pain 07/05/2019  . Enteritis 07/05/2019  . 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 Normocytic anemia 12/28/2016  . Postviral gastroparesis   . Type 2 diabetes mellitus ([place]) 10/23/2016  . AKI (acute kidney injury) ([place]) 10/23/2016  . IBS (irritable bowel syndrome) 10/12/2015  . Acute pancreatitis 10/10/2015  . Diarrhea 10/10/2015  . Non-intractable vomiting with nausea 10/10/2015  . Type 2 diabetes mellitus without complication ([place]) [id number]  . Labial abscess 10/10/2015  . Hyponatremia 10/10/2015  . Essential hypertension 10/10/2015  . Tobacco abuse 10/10/2015  . Bartholin cyst 07/03/2014  . Diabetes ([place]) 09/03/2013  . Obesity 09/03/2013  . Vaginal itching 09/03/2013  . Nicotine addiction 09/03/2013  . 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 TENOSYNOVITIS OF FOOT AND ANKLE 09/22/2010  . FOOT PAIN, BILATERAL 09/22/2010    9:46 AM, 04/09/20 Mearl Latin PT, DPT Physical Therapist at [place], [place], [address] Phone: [phone]   Fax:  [phone]  Name: Shawna Huerta MRN: [id number] Date of Birth: 25-Jan-1961

## 2020-04-09 NOTE — Patient Instructions (Signed)
Access Code: [email or web address] URL: [email or web address] Date: 04/09/2020 Prepared by: Mitzi Hansen Leara Rawl  Exercises Seated Ankle Inversion with Anchored Resistance - 1 x daily - 7 x weekly - 2 sets - 10 reps Single Leg Stance - 1 x daily - 7 x weekly - 3 reps - 30 hold

## 2020-04-13 ENCOUNTER — Encounter (HOSPITAL_COMMUNITY): Payer: Self-pay | Admitting: Physical Therapy

## 2020-04-13 ENCOUNTER — Ambulatory Visit (HOSPITAL_COMMUNITY): Payer: BC Managed Care – PPO | Admitting: Physical Therapy

## 2020-04-13 ENCOUNTER — Other Ambulatory Visit: Payer: Self-pay

## 2020-04-13 DIAGNOSIS — M25671 Stiffness of right ankle, not elsewhere classified: Secondary | ICD-10-CM

## 2020-04-13 DIAGNOSIS — R2689 Other abnormalities of gait and mobility: Secondary | ICD-10-CM

## 2020-04-13 DIAGNOSIS — M6281 Muscle weakness (generalized): Secondary | ICD-10-CM

## 2020-04-13 DIAGNOSIS — M25571 Pain in right ankle and joints of right foot: Secondary | ICD-10-CM

## 2020-04-13 NOTE — Therapy (Signed)
[place], [place], [address] Phone: [phone]   Fax:  [phone]  Physical Therapy Treatment  Patient Details  Name: Shawna Huerta MRN: [id number] Date of Birth: September 22, 1961 Referring Provider (PT): Wylene Simmer MD   Encounter Date: 04/13/2020  PT End of Session - 04/13/20 1306    Visit Number  7    Number of Visits  12    Date for PT Re-Evaluation  04/23/20    Authorization Type  BCBS (visit limit 60, no auth)    Authorization - Number of Visits  60    PT Start Time  1302    PT Stop Time  1343    PT Time Calculation (min)  41 min    Activity Tolerance  Patient tolerated treatment well    Behavior During Therapy  [facility] for tasks assessed/performed       Past Medical History:  Diagnosis Date  . Anemia   . Arthritis   . Bartholin cyst 07/03/2014  . Colitis 08/2015   Treated at [facility]  . Diabetes mellitus   . Hypertension   . Labial abscess 08/2015   Status post I&D by Dr. Ladona Horns  . Nicotine addiction 09/03/2013  . Obesity   . Pancreatitis 09/2015   unknown etiology, no further work-up with gastroenterology   . Vaginal itching 09/03/2013    Past Surgical History:  Procedure Laterality Date  . ABDOMINAL HYSTERECTOMY    . ABDOMINAL SURGERY     exploratry with TAH  . BALLOON DILATION N/A 10/26/2016   Procedure: Pyloric channel BALLOON DILATION;  Surgeon: Daneil Dolin, MD;  [facility];  Service: Endoscopy;  Laterality: N/A;  . BARTHOLIN GLAND CYST EXCISION Right 08/20/2014   Procedure: EXCISION OF RIGHT BARTHOLIN'S GLAND CYST;  Surgeon: Florian Buff, MD;  Location: [facility] OR[facility];  Service: Gynecology;  Laterality: Right;  . CHOLECYSTECTOMY    . COLONOSCOPY  10/2015   Doristine Mango: mild diverticulosis, at least 8 polpys removed, multiple tubular adenomas. next tcs in 1 year  . ESOPHAGOGASTRODUODENOSCOPY N/A 10/26/2016   Procedure: ESOPHAGOGASTRODUODENOSCOPY (EGD);  Surgeon: Daneil Dolin, MD;  Location[facility];  Service: Endoscopy;  Laterality: N/A;  . FOOT SURGERY    . FOOT SURGERY Right 2011   shave bone  . TONSILLECTOMY      There were no vitals filed for this visit.  Subjective Assessment - 04/13/20 1301    Subjective  Patient states she got her arch supports and she thinks they may be helping but it feels weird. She did a lot of stuff yesterday and was on her feet a lot. She ices after sessions and rests and that helps with pain.    Currently in Pain?  Yes    Pain Score  2     Pain Location  Ankle         [facility] PT Assessment - 04/13/20 0001      AROM   Right Ankle Dorsiflexion  2   lacking; improves to 3 following manual therapy                   [facility] Adult PT Treatment/Exercise - 04/13/20 0001      Manual Therapy   Manual Therapy  Joint mobilization    Manual therapy comments  completed seperately from all other skilled interventions at [facility]    Joint Mobilization  Grade III AP talocrual mobilzations with DF overpressure  Ankle Exercises: Seated   Other Seated Ankle Exercises  arch formation 10x 5 second holds    Other Seated Ankle Exercises  AROM inversion with greenband resistance 2x10       Ankle Exercises: Stretches   Slant Board Stretch  3 reps;30 seconds      Ankle Exercises: Standing   Vector Stance  Right;Left;5 reps;5 seconds    SLS  2x30 seconds bilateral    Heel Raises  10 reps;Both    Heel Raises Limitations  with posterior tib activation    Toe Raise  10 reps   2 sets   Other Standing Ankle Exercises  tandem balance on blue foam 2x30 seconds bilateral              PT Education - 04/13/20 1305    Education Details  Patient educated on continuing HEP, review of HEP    Person(s) Educated  Patient    Methods  Explanation;Demonstration    Comprehension  Verbalized understanding;Returned demonstration       PT Short Term Goals - 04/09/20 0933      PT SHORT TERM GOAL #1   Title  Patient will  be independent in self management strategies to improve quality of life and functional outcomes.    Time  3    Period  Weeks    Status  Achieved    Target Date  04/02/20      PT SHORT TERM GOAL #2   Title  Patient will report at least 25% improvement in symptoms for improved quality of life.    Time  3    Period  Weeks    Status  Achieved    Target Date  04/02/20        PT Long Term Goals - 03/17/20 0854      PT LONG TERM GOAL #1   Title  Patient will improve FOTO score by at least 10 points in order to indicate improved tolerance to activity.    Time  6    Period  Weeks    Status  On-going      PT LONG TERM GOAL #2   Title  Patient will be able to ambulate for at least 60 minutes with pain no greater than 1/10 in order to demonstrate improved ability to ambulate in the community.    Time  6    Period  Weeks    Status  On-going      PT LONG TERM GOAL #3   Title  Patient will report at least 75% improvement in symptoms for improved quality of life.    Time  6    Period  Weeks    Status  On-going            Plan - 04/13/20 1307    Clinical Impression Statement  Patient demonstrates improving ROM with mobilizations today and improves to 3 degrees DF. She tolerates resisted ankle inversion with increased resistance. Patient is making slow progress and continues to be limited by pain but she does tolerate session well and is highly motivated to improve function. She demonstrates improving static balance today and has mostly medial/lateral sway that she corrects at the ankle. She has most difficulty with proprioceptive exercises on foam. Patient also educated on increasing frequency of HEP to improve endurance and continuing walking program. Patient will benefit from skilled physical therapy in order to improve function and reduce impairment.    Personal Factors and Comorbidities  Past/Current Experience;Behavior Pattern;Time since onset  of  injury/illness/exacerbation;Comorbidity 3+    Comorbidities  htn, diabetes, obesity    Examination-Activity Limitations  Carry;Locomotion Level;Stand;Lift;Transfers;Stairs;Squat    Examination-Participation Restrictions  Yard Work;Community Activity;Cleaning    Stability/Clinical Decision Making  Evolving/Moderate complexity    Rehab Potential  Good    PT Frequency  2x / week    PT Duration  6 weeks    PT Treatment/Interventions  Gait training;Stair training;Functional mobility training;Therapeutic activities;Therapeutic exercise;Balance training;Manual techniques;Patient/family education;Passive range of motion;Aquatic Therapy;ADLs/Self Care Home Management;Biofeedback;Cryotherapy;Electrical Stimulation;Iontophoresis 4mg /ml Dexamethasone;Moist Heat;Traction;Ultrasound;Parrafin;Fluidtherapy;DME Instruction;Contrast Bath;Neuromuscular re-education;Orthotic Fit/Training;Manual lymph drainage;Compression bandaging;Scar mobilization;Dry needling;Energy conservation;Splinting;Taping;Vasopneumatic Device;Spinal Manipulations;Joint Manipulations    PT Next Visit Plan  Progress ankle ROM and strengtheing as tolerated. continue manual therapy for pain relief and mobility, strengthen posterior tibialis    PT Home Exercise Plan  4/14 Calf stretch, AROM 04/01/20 heel raises, inversion isometric, arch formation 5/13 SLS, ankle inversion with resistance 5/17 SLS with vectors    Consulted and Agree with Plan of Care  Patient       Patient will benefit from skilled therapeutic intervention in order to improve the following deficits and impairments:  Decreased activity tolerance, Decreased balance, Pain, Abnormal gait, Decreased range of motion, Decreased endurance, Decreased mobility, Difficulty walking, Decreased strength, Hypomobility, Impaired flexibility, Improper body mechanics  Visit Diagnosis: Muscle weakness (generalized)  Stiffness of right ankle, not elsewhere classified  Pain in right ankle and joints  of right foot  Other abnormalities of gait and mobility     Problem List Patient Active Problem List   Diagnosis Date Noted  . ARF (acute renal failure) ([place]) 07/05/2019  . Abdominal pain 07/05/2019  . Enteritis 07/05/2019  . 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 Normocytic anemia 12/28/2016  . Postviral gastroparesis   . Type 2 diabetes mellitus ([place]) 10/23/2016  . AKI (acute kidney injury) ([place]) 10/23/2016  . IBS (irritable bowel syndrome) 10/12/2015  . Acute pancreatitis 10/10/2015  . Diarrhea 10/10/2015  . Non-intractable vomiting with nausea 10/10/2015  . Type 2 diabetes mellitus without complication ([place]) [id number]  . Labial abscess 10/10/2015  . Hyponatremia 10/10/2015  . Essential hypertension 10/10/2015  . Tobacco abuse 10/10/2015  . Bartholin cyst 07/03/2014  . Diabetes ([place]) 09/03/2013  . Obesity 09/03/2013  . Vaginal itching 09/03/2013  . Nicotine addiction 09/03/2013  . 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 TENOSYNOVITIS OF FOOT AND ANKLE 09/22/2010  . FOOT PAIN, BILATERAL 09/22/2010    1:45 PM, 04/13/20 Mearl Latin PT, DPT Physical Therapist at [facility], [place], [address] Phone: [phone]   Fax:  [phone]  Name: Shawna Huerta MRN: [id number] Date of Birth: Oct 26, 1961

## 2020-04-15 ENCOUNTER — Ambulatory Visit (HOSPITAL_COMMUNITY): Payer: BC Managed Care – PPO | Admitting: Physical Therapy

## 2020-04-20 ENCOUNTER — Ambulatory Visit (HOSPITAL_COMMUNITY): Payer: BC Managed Care – PPO | Admitting: Physical Therapy

## 2020-04-20 ENCOUNTER — Encounter (HOSPITAL_COMMUNITY): Payer: Self-pay | Admitting: Physical Therapy

## 2020-04-20 ENCOUNTER — Other Ambulatory Visit: Payer: Self-pay

## 2020-04-20 DIAGNOSIS — M25671 Stiffness of right ankle, not elsewhere classified: Secondary | ICD-10-CM

## 2020-04-20 DIAGNOSIS — M6281 Muscle weakness (generalized): Secondary | ICD-10-CM | POA: Diagnosis not present

## 2020-04-20 DIAGNOSIS — R2689 Other abnormalities of gait and mobility: Secondary | ICD-10-CM

## 2020-04-20 DIAGNOSIS — M25571 Pain in right ankle and joints of right foot: Secondary | ICD-10-CM

## 2020-04-20 NOTE — Therapy (Signed)
[place], [place], [address] Phone: [phone]   Fax:  [phone]  Physical Therapy Treatment  Patient Details  Name: Shawna Huerta MRN: [id number] Date of Birth: December 12, 1960 Referring Provider (PT): Wylene Simmer MD   Encounter Date: 04/20/2020  PT End of Session - 04/20/20 0905    Visit Number  8    Number of Visits  12    Date for PT Re-Evaluation  04/23/20    Authorization Type  BCBS (visit limit 60, no auth)    Authorization - Number of Visits  60    PT Start Time  0901    PT Stop Time  0941    PT Time Calculation (min)  40 min    Activity Tolerance  Patient tolerated treatment well    Behavior During Therapy  [facility] for tasks assessed/performed       Past Medical History:  Diagnosis Date  . Anemia   . Arthritis   . Bartholin cyst 07/03/2014  . Colitis 08/2015   Treated at [facility]  . Diabetes mellitus   . Hypertension   . Labial abscess 08/2015   Status post I&D by Dr. Ladona Horns  . Nicotine addiction 09/03/2013  . Obesity   . Pancreatitis 09/2015   unknown etiology, no further work-up with gastroenterology   . Vaginal itching 09/03/2013    Past Surgical History:  Procedure Laterality Date  . ABDOMINAL HYSTERECTOMY    . ABDOMINAL SURGERY     exploratry with TAH  . BALLOON DILATION N/A 10/26/2016   Procedure: Pyloric channel BALLOON DILATION;  Surgeon: Daneil Dolin, MD;  [facility];  Service: Endoscopy;  Laterality: N/A;  . BARTHOLIN GLAND CYST EXCISION Right 08/20/2014   Procedure: EXCISION OF RIGHT BARTHOLIN'S GLAND CYST;  Surgeon: Florian Buff, MD;  Location: [facility];  Service: Gynecology;  Laterality: Right;  . CHOLECYSTECTOMY    . COLONOSCOPY  10/2015   Doristine Mango: mild diverticulosis, at least 8 polpys removed, multiple tubular adenomas. next tcs in 1 year  . ESOPHAGOGASTRODUODENOSCOPY N/A 10/26/2016   Procedure: ESOPHAGOGASTRODUODENOSCOPY (EGD);  Surgeon: Daneil Dolin, MD;  [facility];  Service: Endoscopy;  Laterality: N/A;  . FOOT SURGERY    . FOOT SURGERY Right 2011   shave bone  . TONSILLECTOMY      There were no vitals filed for this visit.  Subjective Assessment - 04/20/20 0902    Subjective  Patient missed last weeks appointment due to a death in family. Her foot hurt while she was driving. She did a little of her exercises. Her arch supports are seeming to help.    Currently in Pain?  Yes    Pain Score  3     Pain Location  Ankle    Pain Orientation  Right                        [facility] Adult PT Treatment/Exercise - 04/20/20 0001      Ankle Exercises: Seated   BAPS  Level 2;Sitting;10 reps    Other Seated Ankle Exercises  AROM inversion with greenband resistance 3x10       Ankle Exercises: Stretches   Slant Board Stretch  3 reps;30 seconds      Ankle Exercises: Standing   Vector Stance  Right;Left;5 reps;5 seconds    SLS  2x30 seconds bilateral    Heel Raises  20 reps;Both  Heel Raises Limitations  with posterior tib activation; 2 sets    Toe Raise  10 reps;Limitations   2 sets   Balance Beam  tandem gait on line 4x 15 feet    Other Standing Ankle Exercises  lateral stepping with mini squat 4x15 feet    Other Standing Ankle Exercises  tandem balance on blue foam 2x30 seconds bilateral              PT Education - 04/20/20 0904    Education Details  Patient educated on continuing HEP, review of HEP    Person(s) Educated  Patient    Methods  Explanation;Demonstration    Comprehension  Verbalized understanding;Returned demonstration       PT Short Term Goals - 04/09/20 0933      PT SHORT TERM GOAL #1   Title  Patient will be independent in self management strategies to improve quality of life and functional outcomes.    Time  3    Period  Weeks    Status  Achieved    Target Date  04/02/20      PT SHORT TERM GOAL #2   Title  Patient will report at least 25% improvement in symptoms  for improved quality of life.    Time  3    Period  Weeks    Status  Achieved    Target Date  04/02/20        PT Long Term Goals - 03/17/20 0854      PT LONG TERM GOAL #1   Title  Patient will improve FOTO score by at least 10 points in order to indicate improved tolerance to activity.    Time  6    Period  Weeks    Status  On-going      PT LONG TERM GOAL #2   Title  Patient will be able to ambulate for at least 60 minutes with pain no greater than 1/10 in order to demonstrate improved ability to ambulate in the community.    Time  6    Period  Weeks    Status  On-going      PT LONG TERM GOAL #3   Title  Patient will report at least 75% improvement in symptoms for improved quality of life.    Time  6    Period  Weeks    Status  On-going            Plan - 04/20/20 0906    Clinical Impression Statement  Patient able to complete resistance exercise for posterior tibialis strengthening with min increase in symptoms. She tolerates increased reps today. She has min/mod increase in symptoms with standing exercises today which decreases with rest breaks. She is able to complete heel raises higher today and is demonstrating improving LE strength. She continues to show impaired balance and RLE strength. Patient notes about 40-50% improvement in symptoms with physical therapy intervention. PN will be completed next session with probable recertification. Patient will continue to benefit from skilled physical therapy in order to reduce impairment and improve function.    Personal Factors and Comorbidities  Past/Current Experience;Behavior Pattern;Time since onset of injury/illness/exacerbation;Comorbidity 3+    Comorbidities  htn, diabetes, obesity    Examination-Activity Limitations  Carry;Locomotion Level;Stand;Lift;Transfers;Stairs;Squat    Examination-Participation Restrictions  Yard Work;Community Activity;Cleaning    Stability/Clinical Decision Making  Evolving/Moderate complexity     Rehab Potential  Good    PT Frequency  2x / week    PT Duration  6 weeks    PT Treatment/Interventions  Gait training;Stair training;Functional mobility training;Therapeutic activities;Therapeutic exercise;Balance training;Manual techniques;Patient/family education;Passive range of motion;Aquatic Therapy;ADLs/Self Care Home Management;Biofeedback;Cryotherapy;Electrical Stimulation;Iontophoresis 4mg /ml Dexamethasone;Moist Heat;Traction;Ultrasound;Parrafin;Fluidtherapy;DME Instruction;Contrast Bath;Neuromuscular re-education;Orthotic Fit/Training;Manual lymph drainage;Compression bandaging;Scar mobilization;Dry needling;Energy conservation;Splinting;Taping;Vasopneumatic Device;Spinal Manipulations;Joint Manipulations    PT Next Visit Plan  Progress ankle ROM and strengtheing as tolerated. continue manual therapy for pain relief and mobility, strengthen posterior tibialis; PN next session    PT Home Exercise Plan  4/14 Calf stretch, AROM 04/01/20 heel raises, inversion isometric, arch formation 5/13 SLS, ankle inversion with resistance 5/17 SLS with vectors    Consulted and Agree with Plan of Care  Patient       Patient will benefit from skilled therapeutic intervention in order to improve the following deficits and impairments:  Decreased activity tolerance, Decreased balance, Pain, Abnormal gait, Decreased range of motion, Decreased endurance, Decreased mobility, Difficulty walking, Decreased strength, Hypomobility, Impaired flexibility, Improper body mechanics  Visit Diagnosis: Muscle weakness (generalized)  Stiffness of right ankle, not elsewhere classified  Pain in right ankle and joints of right foot  Other abnormalities of gait and mobility     Problem List Patient Active Problem List   Diagnosis Date Noted  . ARF (acute renal failure) ([place]) 07/05/2019  . Abdominal pain 07/05/2019  . Enteritis 07/05/2019  . 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 Normocytic anemia 12/28/2016  . Postviral  gastroparesis   . Type 2 diabetes mellitus ([place]) 10/23/2016  . AKI (acute kidney injury) ([place]) 10/23/2016  . IBS (irritable bowel syndrome) 10/12/2015  . Acute pancreatitis 10/10/2015  . Diarrhea 10/10/2015  . Non-intractable vomiting with nausea 10/10/2015  . Type 2 diabetes mellitus without complication ([place]) [id number]  . Labial abscess 10/10/2015  . Hyponatremia 10/10/2015  . Essential hypertension 10/10/2015  . Tobacco abuse 10/10/2015  . Bartholin cyst 07/03/2014  . Diabetes ([place]) 09/03/2013  . Obesity 09/03/2013  . Vaginal itching 09/03/2013  . Nicotine addiction 09/03/2013  . 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 TENOSYNOVITIS OF FOOT AND ANKLE 09/22/2010  . FOOT PAIN, BILATERAL 09/22/2010    9:43 AM, 04/20/20 Mearl Latin PT, DPT Physical Therapist at [place], [place], [address] Phone: [phone]   Fax:  [phone]  Name: Shawna Huerta MRN: [id number] Date of Birth: [birth year]

## 2020-04-22 ENCOUNTER — Encounter (HOSPITAL_COMMUNITY): Payer: Self-pay | Admitting: Physical Therapy

## 2020-04-22 ENCOUNTER — Ambulatory Visit (HOSPITAL_COMMUNITY): Payer: BC Managed Care – PPO | Admitting: Physical Therapy

## 2020-04-22 ENCOUNTER — Other Ambulatory Visit: Payer: Self-pay

## 2020-04-22 DIAGNOSIS — M6281 Muscle weakness (generalized): Secondary | ICD-10-CM

## 2020-04-22 DIAGNOSIS — M25571 Pain in right ankle and joints of right foot: Secondary | ICD-10-CM

## 2020-04-22 DIAGNOSIS — R2689 Other abnormalities of gait and mobility: Secondary | ICD-10-CM

## 2020-04-22 DIAGNOSIS — M25671 Stiffness of right ankle, not elsewhere classified: Secondary | ICD-10-CM

## 2020-04-22 NOTE — Patient Instructions (Signed)
Access Code: [email or web address] URL: [email or web address] Date: 04/22/2020 Prepared by: Mitzi Hansen Chayah Mckee  Exercises Tandem Stance on Foam Pad with Eyes Open - 1 x daily - 7 x weekly - 2 reps - 30 seconds hold

## 2020-04-22 NOTE — Therapy (Signed)
[facility] [address] [place], [place], [address] Phone: [phone]   Fax:  [phone]  Physical Therapy Treatment/Progress notes/Recert  Patient Details  Name: Shawna Huerta MRN: [id number] Date of Birth: 26-Sep-1961 Referring Provider (PT): Wylene Simmer MD   Encounter Date: 04/22/2020   Progress Note   Reporting Period 03/12/20 to 04/22/20   See note below for Objective Data and Assessment of Progress/Goals   PT End of Session - 04/22/20 0907    Visit Number  9    Number of Visits  20    Date for PT Re-Evaluation  05/20/20    Authorization Type  BCBS (visit limit 60, no auth)    Authorization - Number of Visits  60    Progress Note Due on Visit  19    PT Start Time  0900    PT Stop Time  0940    PT Time Calculation (min)  40 min    Activity Tolerance  Patient tolerated treatment well    Behavior During Therapy  [facility] for tasks assessed/performed       Past Medical History:  Diagnosis Date  . Anemia   . Arthritis   . Bartholin cyst 07/03/2014  . Colitis 08/2015   Treated at [facility]  . Diabetes mellitus   . Hypertension   . Labial abscess 08/2015   Status post I&D by Dr. Ladona Horns  . Nicotine addiction 09/03/2013  . Obesity   . Pancreatitis 09/2015   unknown etiology, no further work-up with gastroenterology   . Vaginal itching 09/03/2013    Past Surgical History:  Procedure Laterality Date  . ABDOMINAL HYSTERECTOMY    . ABDOMINAL SURGERY     exploratry with TAH  . BALLOON DILATION N/A 10/26/2016   Procedure: Pyloric channel BALLOON DILATION;  Surgeon: Daneil Dolin, MD;  [facility];  Service: Endoscopy;  Laterality: N/A;  . BARTHOLIN GLAND CYST EXCISION Right 08/20/2014   Procedure: EXCISION OF RIGHT BARTHOLIN'S GLAND CYST;  Surgeon: Florian Buff, MD;  Location: [facility] ORS;  Service: Gynecology;  Laterality: Right;  . CHOLECYSTECTOMY    . COLONOSCOPY  10/2015   Doristine Mango: mild  diverticulosis, at least 8 polpys removed, multiple tubular adenomas. next tcs in 1 year  . ESOPHAGOGASTRODUODENOSCOPY N/A 10/26/2016   Procedure: ESOPHAGOGASTRODUODENOSCOPY (EGD);  Surgeon: Daneil Dolin, MD;  Location: [facility];  Service: Endoscopy;  Laterality: N/A;  . FOOT SURGERY    . FOOT SURGERY Right 2011   shave bone  . TONSILLECTOMY      There were no vitals filed for this visit.  Subjective Assessment - 04/22/20 0859    Subjective  Patient states she is wearing her insole today. Patient states about 45-50% improvement with physical therapy intervention. She states she is limited with standing and feels that remaining deficit would make it hard to stand and do her job. She states pain has decreased with walking. She can now walk for about 35 minutes comfortably. Her home exercises are going well and she is performing 2x/day.    Pertinent History  HTN, DM    Limitations  Lifting;Standing;Walking;House hold activities    How long can you stand comfortably?  45 minutes    How long can you walk comfortably?  35 minutes    Patient Stated Goals  decrease pain    Currently in Pain?  Yes    Pain Score  2     Pain Location  Ankle  [facility] PT Assessment - 04/22/20 0001      Assessment   Medical Diagnosis  R ankle/foot pain    Referring Provider (PT)  Wylene Simmer MD    Onset Date/Surgical Date  08/13/19    Next MD Visit  June 9th    Prior Therapy  Yes      Precautions   Precautions  None      Restrictions   Weight Bearing Restrictions  No      Balance Screen   Has the patient fallen in the past 6 months  No    Has the patient had a decrease in activity level because of a fear of falling?   No    Is the patient reluctant to leave their home because of a fear of falling?   No      Home Film/video editor residence      Prior Function   Level of Independence  Independent      Cognition   Overall Cognitive Status  Within Functional  Limits for tasks assessed      Observation/Other Assessments   Observations  Patient ambulates without AD    Focus on Therapeutic Outcomes (FOTO)   35% limited      AROM   Right Ankle Dorsiflexion  5    Right Ankle Plantar Flexion  40    Right Ankle Inversion  22    Right Ankle Eversion  10      Strength   Right Ankle Dorsiflexion  4+/5    Right Ankle Plantar Flexion  4+/5    Right Ankle Inversion  4/5   pain   Right Ankle Eversion  4+/5                    [facility] Adult PT Treatment/Exercise - 04/22/20 0001      Ankle Exercises: Seated   Other Seated Ankle Exercises  AROM inversion with greenband resistance 3x10       Ankle Exercises: Stretches   Slant Board Stretch  3 reps;30 seconds      Ankle Exercises: Standing   Heel Raises  10 reps;Both    Heel Raises Limitations  3 sets with posterior tib activation on incline    Toe Raise  10 reps;Limitations   on incline; 2 sets   Other Standing Ankle Exercises  tandem balance on blue foam 2x30 seconds bilateral              PT Education - 04/22/20 0933    Education Details  Patient educated on continuing HEP, review of HEP, educated on progress made, extending POC, continuing with walking and how to increase duration    Person(s) Educated  Patient    Methods  Explanation;Demonstration    Comprehension  Verbalized understanding;Returned demonstration       PT Short Term Goals - 04/09/20 0933      PT SHORT TERM GOAL #1   Title  Patient will be independent in self management strategies to improve quality of life and functional outcomes.    Time  3    Period  Weeks    Status  Achieved    Target Date  04/02/20      PT SHORT TERM GOAL #2   Title  Patient will report at least 25% improvement in symptoms for improved quality of life.    Time  3    Period  Weeks    Status  Achieved    Target  Date  04/02/20        PT Long Term Goals - 04/22/20 0917      PT LONG TERM GOAL #1   Title  Patient will  improve FOTO score by at least 10 points in order to indicate improved tolerance to activity.    Time  6    Period  Weeks    Status  Achieved      PT LONG TERM GOAL #2   Title  Patient will be able to ambulate for at least 60 minutes with pain no greater than 1/10 in order to demonstrate improved ability to ambulate in the community.    Time  6    Period  Weeks    Status  On-going      PT LONG TERM GOAL #3   Title  Patient will report at least 75% improvement in symptoms for improved quality of life.    Time  6    Period  Weeks    Status  On-going            Plan - 04/22/20 0931    Clinical Impression Statement  Patient has met 2/2 short term goals with improving symptoms and ability to complete HEP. She has met 1/3 long term goals with improved activity tolerance. She continues to be limited by symptoms and decreased ambulation/standing time in weight bearing. Patient demonstrates improving R ankle ROM and strength today with decreased in pain. She continues to be limited by posterior tibialis strength and symptoms. Patient will continue to need to progress posterior tibialis strengthening and tolerance to weight bearing. Patient also showing improving but impaired static and dynamic balance today requiring intermittent UE support. Patient will continue to benefit from skilled physical therapy in order to reduce impairment and improve function.    Personal Factors and Comorbidities  Past/Current Experience;Behavior Pattern;Time since onset of injury/illness/exacerbation;Comorbidity 3+    Comorbidities  htn, diabetes, obesity    Examination-Activity Limitations  Carry;Locomotion Level;Stand;Lift;Transfers;Stairs;Squat    Examination-Participation Restrictions  Yard Work;Community Activity;Cleaning    Stability/Clinical Decision Making  Evolving/Moderate complexity    Rehab Potential  Good    PT Frequency  2x / week    PT Duration  4 weeks    PT Treatment/Interventions  Gait  training;Stair training;Functional mobility training;Therapeutic activities;Therapeutic exercise;Balance training;Manual techniques;Patient/family education;Passive range of motion;Aquatic Therapy;ADLs/Self Care Home Management;Biofeedback;Cryotherapy;Electrical Stimulation;Iontophoresis 42m/ml Dexamethasone;Moist Heat;Traction;Ultrasound;Parrafin;Fluidtherapy;DME Instruction;Contrast Bath;Neuromuscular re-education;Orthotic Fit/Training;Manual lymph drainage;Compression bandaging;Scar mobilization;Dry needling;Energy conservation;Splinting;Taping;Vasopneumatic Device;Spinal Manipulations;Joint Manipulations    PT Next Visit Plan  Progress ankle ROM and strengtheing as tolerated. continue manual therapy for pain relief and mobility, strengthen posterior tibialis    PT Home Exercise Plan  4/14 Calf stretch, AROM 04/01/20 heel raises, inversion isometric, arch formation 5/13 SLS, ankle inversion with resistance 5/17 SLS with vectors    Consulted and Agree with Plan of Care  Patient       Patient will benefit from skilled therapeutic intervention in order to improve the following deficits and impairments:  Decreased activity tolerance, Decreased balance, Pain, Abnormal gait, Decreased range of motion, Decreased endurance, Decreased mobility, Difficulty walking, Decreased strength, Hypomobility, Impaired flexibility, Improper body mechanics  Visit Diagnosis: Muscle weakness (generalized)  Stiffness of right ankle, not elsewhere classified  Pain in right ankle and joints of right foot  Other abnormalities of gait and mobility     Problem List Patient Active Problem List   Diagnosis Date Noted  . AR[place] (acute renal failure) ([place] [place] 07/05/2019  . Abdominal pain 07/05/2019  . Enteritis  07/05/2019  . 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 Normocytic anemia 12/28/2016  . Postviral gastroparesis   . Type 2 diabetes mellitus ([place]) 10/23/2016  . AKI (acute kidney injury) ([place]) 10/23/2016  . IBS (irritable bowel  syndrome) 10/12/2015  . Acute pancreatitis 10/10/2015  . Diarrhea 10/10/2015  . Non-intractable vomiting with nausea 10/10/2015  . Type 2 diabetes mellitus without complication ([place]) 14/08/3012  . Labial abscess 10/10/2015  . Hyponatremia 10/10/2015  . Essential hypertension 10/10/2015  . Tobacco abuse 10/10/2015  . Bartholin cyst 07/03/2014  . Diabetes ([place]) 09/03/2013  . Obesity 09/03/2013  . Vaginal itching 09/03/2013  . Nicotine addiction 09/03/2013  . 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 TENOSYNOVITIS OF FOOT AND ANKLE 09/22/2010  . FOOT PAIN, BILATERAL 09/22/2010    9:43 AM, 04/22/20 Mearl Latin PT, DPT Physical Therapist at [place], [place], [address] Phone: [phone]   Fax:  [phone]  Name: Shawna Huerta MRN: [id number] Date of Birth: 05/24/1961

## 2020-05-05 ENCOUNTER — Telehealth (HOSPITAL_COMMUNITY): Payer: Self-pay | Admitting: Physical Therapy

## 2020-05-05 ENCOUNTER — Ambulatory Visit (HOSPITAL_COMMUNITY): Payer: BC Managed Care – PPO | Admitting: Physical Therapy

## 2020-05-05 NOTE — Telephone Encounter (Signed)
pt called to cx today's appt due to she is not feeling well 

## 2020-05-06 ENCOUNTER — Ambulatory Visit (HOSPITAL_COMMUNITY): Payer: BC Managed Care – PPO | Attending: Orthopedic Surgery | Admitting: Physical Therapy

## 2020-05-06 ENCOUNTER — Other Ambulatory Visit: Payer: Self-pay

## 2020-05-06 ENCOUNTER — Encounter (HOSPITAL_COMMUNITY): Payer: Self-pay | Admitting: Physical Therapy

## 2020-05-06 DIAGNOSIS — R2689 Other abnormalities of gait and mobility: Secondary | ICD-10-CM | POA: Diagnosis present

## 2020-05-06 DIAGNOSIS — M6281 Muscle weakness (generalized): Secondary | ICD-10-CM | POA: Insufficient documentation

## 2020-05-06 DIAGNOSIS — M25671 Stiffness of right ankle, not elsewhere classified: Secondary | ICD-10-CM | POA: Diagnosis present

## 2020-05-06 DIAGNOSIS — M25571 Pain in right ankle and joints of right foot: Secondary | ICD-10-CM | POA: Insufficient documentation

## 2020-05-06 NOTE — Therapy (Signed)
[facility] [place], [place], [address] Phone: [phone]   Fax:  [phone]  Physical Therapy Treatment  Patient Details  Name: Shawna Huerta MRN: [id number] Date of Birth: Jul 05, 1961 Referring Provider (PT): Wylene Simmer MD   Encounter Date: 05/06/2020  PT End of Session - 05/06/20 1129    Visit Number  10    Number of Visits  20    Date for PT Re-Evaluation  05/20/20    Authorization Type  BCBS (visit limit 60, no auth)    Authorization - Number of Visits  60    Progress Note Due on Visit  19    PT Start Time  1122    PT Stop Time  1200    PT Time Calculation (min)  38 min    Activity Tolerance  Patient tolerated treatment well    Behavior During Therapy  [facility] for tasks assessed/performed       Past Medical History:  Diagnosis Date  . Anemia   . Arthritis   . Bartholin cyst 07/03/2014  . Colitis 08/2015   Treated at [facility]  . Diabetes mellitus   . Hypertension   . Labial abscess 08/2015   Status post I&D by Dr. Ladona Horns  . Nicotine addiction 09/03/2013  . Obesity   . Pancreatitis 09/2015   unknown etiology, no further work-up with gastroenterology   . Vaginal itching 09/03/2013    Past Surgical History:  Procedure Laterality Date  . ABDOMINAL HYSTERECTOMY    . ABDOMINAL SURGERY     exploratry with TAH  . BALLOON DILATION N/A 10/26/2016   Procedure: Pyloric channel BALLOON DILATION;  Surgeon: Daneil Dolin, MD;  [facility];  Service: Endoscopy;  Laterality: N/A;  . BARTHOLIN GLAND CYST EXCISION Right 08/20/2014   Procedure: EXCISION OF RIGHT BARTHOLIN'S GLAND CYST;  Surgeon: Florian Buff, MD;  Location: [facility] ORS;  Service: Gynecology;  Laterality: Right;  . CHOLECYSTECTOMY    . COLONOSCOPY  10/2015   Doristine Mango: mild diverticulosis, at least 8 polpys removed, multiple tubular adenomas. next tcs in 1 year  . ESOPHAGOGASTRODUODENOSCOPY N/A 10/26/2016   Procedure:  ESOPHAGOGASTRODUODENOSCOPY (EGD);  Surgeon: Daneil Dolin, MD;  [facility];  Service: Endoscopy;  Laterality: N/A;  . FOOT SURGERY    . FOOT SURGERY Right 2011   shave bone  . TONSILLECTOMY      There were no vitals filed for this visit.  Subjective Assessment - 05/06/20 1124    Subjective  Patient states she had a headache yesterday. She states she had MD appointment this morning and he said to continue PT. Patient states she doesn't know if she is going to be able to go back to the current job she had because of all the standing. Exercises have been going well  at home.    Pertinent History  HTN, DM    Limitations  Lifting;Standing;Walking;House hold activities    How long can you stand comfortably?  45 minutes    How long can you walk comfortably?  35 minutes    Patient Stated Goals  decrease pain    Currently in Pain?  Yes    Pain Score  4     Pain Location  Ankle    Pain Orientation  Right         [facility] PT Assessment - 05/06/20 0001      Observation/Other Assessments   Observations  R foot and ankle edema  Palpation   Palpation comment  TTP througout R foot and ankle, posterior tib tendon, anterior talocrual joint                    [facility] Adult PT Treatment/Exercise - 05/06/20 0001      Ankle Exercises: Standing   Vector Stance  Right;Left;5 reps;5 seconds    Heel Raises  10 reps;Both    Heel Raises Limitations  3 sets with posterior tib activation on incline    Toe Raise  Limitations;20 reps    Other Standing Ankle Exercises  tandem balance 2x30 seconds bilateral       Ankle Exercises: Seated   Other Seated Ankle Exercises  AROM inversion with greenband resistance 4x10       Ankle Exercises: Stretches   Slant Board Stretch  3 reps;30 seconds             PT Education - 05/06/20 1128    Education Details  Patient educated on continuing HEP, review of HEP, ice and elevation and compression for swelling    Person(s) Educated   Patient    Methods  Explanation;Demonstration    Comprehension  Verbalized understanding;Returned demonstration       PT Short Term Goals - 04/09/20 0933      PT SHORT TERM GOAL #1   Title  Patient will be independent in self management strategies to improve quality of life and functional outcomes.    Time  3    Period  Weeks    Status  Achieved    Target Date  04/02/20      PT SHORT TERM GOAL #2   Title  Patient will report at least 25% improvement in symptoms for improved quality of life.    Time  3    Period  Weeks    Status  Achieved    Target Date  04/02/20        PT Long Term Goals - 04/22/20 0917      PT LONG TERM GOAL #1   Title  Patient will improve FOTO score by at least 10 points in order to indicate improved tolerance to activity.    Time  6    Period  Weeks    Status  Achieved      PT LONG TERM GOAL #2   Title  Patient will be able to ambulate for at least 60 minutes with pain no greater than 1/10 in order to demonstrate improved ability to ambulate in the community.    Time  6    Period  Weeks    Status  On-going      PT LONG TERM GOAL #3   Title  Patient will report at least 75% improvement in symptoms for improved quality of life.    Time  6    Period  Weeks    Status  On-going            Plan - 05/06/20 1129    Clinical Impression Statement  Patient able to complete heel raises with good form and good posterior tibialis activation. Patient demonstrating increased R foot/ankle edema today without cause and has more pain than recently which just began today. Patient demonstrates impaired balance with SLS with vectors and has more difficulty than usual while completing. She tolerates seated exercises well today. Patient educated on elevation and ice for swelling as well as continuing ankle movements. Patient will continue to benefit from skilled physical therapy in order to reduce impairment  and improve function.    Personal Factors and  Comorbidities  Past/Current Experience;Behavior Pattern;Time since onset of injury/illness/exacerbation;Comorbidity 3+    Comorbidities  htn, diabetes, obesity    Examination-Activity Limitations  Carry;Locomotion Level;Stand;Lift;Transfers;Stairs;Squat    Examination-Participation Restrictions  Yard Work;Community Activity;Cleaning    Stability/Clinical Decision Making  Evolving/Moderate complexity    Rehab Potential  Good    PT Frequency  2x / week    PT Duration  4 weeks    PT Treatment/Interventions  Gait training;Stair training;Functional mobility training;Therapeutic activities;Therapeutic exercise;Balance training;Manual techniques;Patient/family education;Passive range of motion;Aquatic Therapy;ADLs/Self Care Home Management;Biofeedback;Cryotherapy;Electrical Stimulation;Iontophoresis 4mg /ml Dexamethasone;Moist Heat;Traction;Ultrasound;Parrafin;Fluidtherapy;DME Instruction;Contrast Bath;Neuromuscular re-education;Orthotic Fit/Training;Manual lymph drainage;Compression bandaging;Scar mobilization;Dry needling;Energy conservation;Splinting;Taping;Vasopneumatic Device;Spinal Manipulations;Joint Manipulations    PT Next Visit Plan  Progress ankle ROM and strengtheing as tolerated. continue manual therapy for pain relief and mobility, strengthen posterior tibialis    PT Home Exercise Plan  4/14 Calf stretch, AROM 04/01/20 heel raises, inversion isometric, arch formation 5/13 SLS, ankle inversion with resistance 5/17 SLS with vectors    Consulted and Agree with Plan of Care  Patient       Patient will benefit from skilled therapeutic intervention in order to improve the following deficits and impairments:  Decreased activity tolerance, Decreased balance, Pain, Abnormal gait, Decreased range of motion, Decreased endurance, Decreased mobility, Difficulty walking, Decreased strength, Hypomobility, Impaired flexibility, Improper body mechanics  Visit Diagnosis: Muscle weakness  (generalized)  Stiffness of right ankle, not elsewhere classified  Pain in right ankle and joints of right foot  Other abnormalities of gait and mobility     Problem List Patient Active Problem List   Diagnosis Date Noted  . ARF (acute renal failure) ([place]) 07/05/2019  . Abdominal pain 07/05/2019  . Enteritis 07/05/2019  . 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 Normocytic anemia 12/28/2016  . Postviral gastroparesis   . Type 2 diabetes mellitus ([place]) 10/23/2016  . AKI (acute kidney injury) ([place]) 10/23/2016  . IBS (irritable bowel syndrome) 10/12/2015  . Acute pancreatitis 10/10/2015  . Diarrhea 10/10/2015  . Non-intractable vomiting with nausea 10/10/2015  . Type 2 diabetes mellitus without complication ([place]) 65/53/7482  . Labial abscess 10/10/2015  . Hyponatremia 10/10/2015  . Essential hypertension 10/10/2015  . Tobacco abuse 10/10/2015  . Bartholin cyst 07/03/2014  . Diabetes ([place]) 09/03/2013  . Obesity 09/03/2013  . Vaginal itching 09/03/2013  . Nicotine addiction 09/03/2013  . 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 TENOSYNOVITIS OF FOOT AND ANKLE 09/22/2010  . FOOT PAIN, BILATERAL 09/22/2010    12:00 PM, 05/06/20 Mearl Latin PT, DPT Physical Therapist at [place], [place], [address] Phone: [phone]   Fax:  [phone]  Name: Shawna Huerta MRN: [id number] Date of Birth: May 10, 1961

## 2020-05-11 ENCOUNTER — Telehealth (HOSPITAL_COMMUNITY): Payer: Self-pay | Admitting: Physical Therapy

## 2020-05-11 ENCOUNTER — Ambulatory Visit (HOSPITAL_COMMUNITY): Payer: BC Managed Care – PPO | Admitting: Physical Therapy

## 2020-05-11 NOTE — Telephone Encounter (Signed)
No show, called patient to make her aware of missed appointment and the call went to voicemail. Mailbox was full and no message could be left.  3:39 PM, 05/11/20 Shawna Huerta PT, DPT Physical Therapist at [facility]

## 2020-05-13 ENCOUNTER — Telehealth (HOSPITAL_COMMUNITY): Payer: Self-pay | Admitting: Physical Therapy

## 2020-05-13 ENCOUNTER — Ambulatory Visit (HOSPITAL_COMMUNITY): Payer: BC Managed Care – PPO | Admitting: Physical Therapy

## 2020-05-13 NOTE — Telephone Encounter (Signed)
Pt cx her foot is swollen and she can not walk on it - she will be here Monday

## 2020-05-18 ENCOUNTER — Ambulatory Visit (HOSPITAL_COMMUNITY): Payer: BC Managed Care – PPO | Admitting: Physical Therapy

## 2020-05-20 ENCOUNTER — Telehealth (HOSPITAL_COMMUNITY): Payer: Self-pay | Admitting: Physical Therapy

## 2020-05-20 ENCOUNTER — Ambulatory Visit (HOSPITAL_COMMUNITY): Payer: BC Managed Care – PPO | Admitting: Physical Therapy

## 2020-05-20 NOTE — Telephone Encounter (Signed)
pt called to cx today's appt lmonvm no reason given °

## 2020-05-25 ENCOUNTER — Ambulatory Visit (HOSPITAL_COMMUNITY): Payer: BC Managed Care – PPO | Admitting: Physical Therapy

## 2020-05-27 ENCOUNTER — Ambulatory Visit (HOSPITAL_COMMUNITY): Payer: BC Managed Care – PPO | Admitting: Physical Therapy

## 2020-05-28 ENCOUNTER — Telehealth (HOSPITAL_COMMUNITY): Payer: Self-pay | Admitting: Physical Therapy

## 2020-05-28 NOTE — Telephone Encounter (Signed)
Patient no show for appointment 7/30, left message for patient making her aware of missed appointment and instructed her to contact clinic if she would like to schedule reevaluation or wanted to be discharged from therapy since there are no scheduled appointments remaining.   3:28 PM, 05/28/20 Mearl Latin PT, DPT Physical Therapist at [facility]

## 2020-08-18 ENCOUNTER — Encounter (HOSPITAL_COMMUNITY): Payer: Self-pay | Admitting: Physical Therapy

## 2020-08-18 NOTE — Therapy (Signed)
[place], [place], [address] Phone: [phone]   Fax:  [phone]  Patient Details  Name: Shawna Huerta MRN: [id number] Date of Birth: [birth year] Referring Provider:  No ref. provider found  Encounter Date: 08/18/2020  PHYSICAL THERAPY DISCHARGE SUMMARY  Visits from Start of Care: 10  Current functional level related to goals / functional outcomes: Unable to reassess as patient has not returned. At last appointment patient had met 2/2 short term goals and 1/3 long term goals.    Remaining deficits: Unable to reassess as patient has not returned.   Education / Equipment: HEP  Plan: Patient agrees to discharge.  Patient goals were partially met. Patient is being discharged due to not returning since the last visit.  ?????       1:43 PM, 08/18/20 Mearl Latin PT, DPT Physical Therapist at [place], [place], [address] Phone: [phone]   Fax:  [phone]

## 2020-10-29 ENCOUNTER — Ambulatory Visit: Payer: Medicaid Other | Attending: Internal Medicine

## 2020-10-29 DIAGNOSIS — Z23 Encounter for immunization: Secondary | ICD-10-CM

## 2020-10-29 NOTE — Progress Notes (Signed)
   Covid-19 Vaccination Clinic  Name:  CARILYN WOOLSTON    MRN: [id number] DOB: Jul 02, 1961  10/29/2020  Ms. Koci was observed post Covid-19 immunization for 15 minutes without incident. She was provided with Vaccine Information Sheet and instruction to access the V-Safe system.   Ms. Rehm was instructed to call 911 with any severe reactions post vaccine: Marland Kitchen Difficulty breathing  . Swelling of face and throat  . A fast heartbeat  . A bad rash all over body  . Dizziness and weakness   Immunizations Administered    No immunizations on file.

## 2021-10-27 ENCOUNTER — Ambulatory Visit
Admission: EM | Admit: 2021-10-27 | Discharge: 2021-10-27 | Disposition: A | Discharge status: Home / Self Care | Payer: 59 | Attending: Family Medicine | Admitting: Family Medicine

## 2021-10-27 ENCOUNTER — Other Ambulatory Visit: Payer: Self-pay

## 2021-10-27 DIAGNOSIS — I10 Essential (primary) hypertension: Secondary | ICD-10-CM | POA: Diagnosis not present

## 2021-10-27 DIAGNOSIS — R600 Localized edema: Secondary | ICD-10-CM | POA: Diagnosis not present

## 2021-10-27 MED ORDER — FUROSEMIDE 20 MG PO TABS: ORAL_TABLET | ORAL | 0 refills | Status: DC

## 2021-10-27 NOTE — Discharge Instructions (Addendum)
Swelling happens when fluid collects in small spaces around tissues and organs inside the body. Another word for swelling is "edema." Some common parts of the body where people can have swelling are the lower legs or hands. This typically is worse in the areas of the body that are closest to the ground (because of gravity)  Symptoms of swelling can include puffiness of the skin, which can cause the skin to look stretched and shiny. This often occurs with swelling in the lower legs and can be worse after you sit or stand for a long time.  Your blood pressure was noted to be elevated during your visit today. If you are currently taking medication for high blood pressure, please ensure you are taking this as directed. If you do not have a history of high blood pressure and your blood pressure remains persistently elevated, you may need to begin taking a medication at some point. You may return here within the next few days to recheck if unable to see your primary care provider or if you do not have a one.  BP (!) 196/107 (BP Location: Right Arm)   Pulse 72   Temp 98 F (36.7 C) (Oral)   Resp 18   SpO2 97%   BP Readings from Last 3 Encounters:  10/27/21 (!) 196/107  07/07/19 130/67  12/04/17 (!) 169/75      Treatment of edema includes several components: treatment of the underlying cause (if possible), reducing the amount of salt (sodium) in your diet, and, in many cases, use of a medication called a diuretic to eliminate excess fluid. Using compression stockings and elevating the legs may also be recommended.

## 2021-10-27 NOTE — ED Provider Notes (Signed)
Shawna Huerta   [id number] 10/27/21 Arrival Time: 0300  ASSESSMENT & PLAN:  1. Bilateral lower extremity edema   2. Elevated blood pressure reading in office with diagnosis of hypertension    Trial of: Meds ordered this encounter  Medications   furosemide (LASIX) 20 MG tablet    Sig: Take 1-2 tablets daily for 5 days.    Dispense:  10 tablet    Refill:  0    Follow-up Information     [facility] EMERGENCY DEPARTMENT.   Specialty: Emergency Medicine Why: If symptoms worsen in any way. Contact information: [address] [id number] [place] [phone]                Chest pain precautions given.  Discharge Instructions      Swelling happens when fluid collects in small spaces around tissues and organs inside the body. Another word for swelling is "edema." Some common parts of the body where people can have swelling are the lower legs or hands. This typically is worse in the areas of the body that are closest to the ground (because of gravity)  Symptoms of swelling can include puffiness of the skin, which can cause the skin to look stretched and shiny. This often occurs with swelling in the lower legs and can be worse after you sit or stand for a long time.  Your blood pressure was noted to be elevated during your visit today. If you are currently taking medication for high blood pressure, please ensure you are taking this as directed. If you do not have a history of high blood pressure and your blood pressure remains persistently elevated, you may need to begin taking a medication at some point. You may return here within the next few days to recheck if unable to see your primary care provider or if you do not have a one.  BP (!) 196/107 (BP Location: Right Arm)   Pulse 72   Temp 98 F (36.7 C) (Oral)   Resp 18   SpO2 97%   BP Readings from Last 3 Encounters:  10/27/21 (!) 196/107  07/07/19 130/67  12/04/17 (!) 169/75       Treatment of edema includes several components: treatment of the underlying cause (if possible), reducing the amount of salt (sodium) in your diet, and, in many cases, use of a medication called a diuretic to eliminate excess fluid. Using compression stockings and elevating the legs may also be recommended.        Reviewed expectations re: course of current medical issues. Questions answered. Outlined signs and symptoms indicating need for more acute intervention. Patient verbalized understanding. After Visit Summary given.   SUBJECTIVE:  History from: patient. Shawna Huerta is a 60 y.o. female who presents with complaint of LE edema; h/o similar. Worse over past week; has noted bruise of anterior LLE. Very tender. No recent travel or prolonged immobility. No CP/SOB. No new medications.  Social History   Tobacco Use  Smoking Status Former   Packs/day: 0.25   Years: 3.00   Pack years: 0.75   Types: Cigarettes   Quit date: 10/21/2016   Years since quitting: 5.0  Smokeless Tobacco Never   Social History   Substance and Sexual Activity  Alcohol Use No   Comment: occ.   Increased blood pressure noted today. Reports that she is treated for HTN. She reports taking medications as instructed, no orthostatic dizziness or lightheadedness, no orthopnea or paroxysmal nocturnal dyspnea, no  palpitations, and no intermittent claudication symptoms.  OBJECTIVE:  Vitals:   10/27/21 0958  BP: (!) 196/107  Pulse: 72  Resp: 18  Temp: 98 F (36.7 C)  TempSrc: Oral  SpO2: 97%    General appearance: alert, oriented, no acute distress Eyes: PERRLA; EOMI; conjunctivae normal HENT: normocephalic; atraumatic Neck: supple with FROM Lungs: without labored respirations Abdomen: soft, non-tender; no guarding or rebound tenderness Extremities: with 2+ edema (L>R); without calf firmness or tenderness; L anterior lower leg with bruising/thickening of skin likely from local trauma; no  bleeding; area is TTP Skin: warm and dry; slight venous stasis changes over lower extremities Neuro: normal gait Psychological: alert and cooperative; normal mood and affect   Allergies  Allergen Reactions   Iohexol Swelling     Code: HIVES, Desc: PT STATES THE EYES SWOLL AND ITCHED, NEEDS PRE MEDS     Past Medical History:  Diagnosis Date   Anemia    Arthritis    Bartholin cyst 07/03/2014   Colitis 08/2015   Treated at [facility]   Diabetes mellitus    Hypertension    Labial abscess 08/2015   Status post I&D by Dr. Ladona Horns   Nicotine addiction 09/03/2013   Obesity    Pancreatitis 09/2015   unknown etiology, no further work-up with gastroenterology    Vaginal itching 09/03/2013   Social History   Socioeconomic History   Marital status: Legally Separated    Spouse name: Not on file   Number of children: Not on file   Years of education: Not on file   Highest education level: Not on file  Occupational History   Not on file  Tobacco Use   Smoking status: Former    Packs/day: 0.25    Years: 3.00    Pack years: 0.75    Types: Cigarettes    Quit date: 10/21/2016    Years since quitting: 5.0   Smokeless tobacco: Never  Substance and Sexual Activity   Alcohol use: No    Comment: occ.   Drug use: No   Sexual activity: Not Currently    Birth control/protection: Surgical  Other Topics Concern   Not on file  Social History Narrative   Not on file   Social Determinants of Health   Financial Resource Strain: Not on file  Food Insecurity: Not on file  Transportation Needs: Not on file  Physical Activity: Not on file  Stress: Not on file  Social Connections: Not on file  Intimate Partner Violence: Not on file   Family History  Problem Relation Age of Onset   Diabetes Mother    Stroke Mother    Cancer Father        liver and colon. Patient unsure primary    Stomach cancer Father    Diabetes Sister    Diabetes Brother    Lung cancer Brother    Past  Surgical History:  Procedure Laterality Date   ABDOMINAL HYSTERECTOMY     ABDOMINAL SURGERY     exploratry with TAH   BALLOON DILATION N/A 10/26/2016   Procedure: Pyloric channel BALLOON DILATION;  Surgeon: Daneil Dolin, MD;  Location: [facility];  Service: Endoscopy;  Laterality: N/A;   BARTHOLIN GLAND CYST EXCISION Right 08/20/2014   Procedure: EXCISION OF RIGHT BARTHOLIN'S GLAND CYST;  Surgeon: Florian Buff, MD;  Location: [facility] ORS;  Service: Gynecology;  Laterality: Right;   CHOLECYSTECTOMY     COLONOSCOPY  10/2015   Doristine Mango: mild diverticulosis, at least 8  polpys removed, multiple tubular adenomas. next tcs in 1 year   ESOPHAGOGASTRODUODENOSCOPY N/A 10/26/2016   Procedure: ESOPHAGOGASTRODUODENOSCOPY (EGD);  Surgeon: Daneil Dolin, MD;  Location: [facility];  Service: Endoscopy;  Laterality: N/A;   FOOT SURGERY     FOOT SURGERY Right 2011   shave bone   TONSILLECTOMY        Vanessa Kick, MD 10/27/21 [phone]

## 2021-10-27 NOTE — ED Triage Notes (Signed)
Patient states about 5 years ago she woke up and both legs were swollen. About a week ago the left leg had a bruise, knot,swollen and in pain.   Patient states she took tylenol to ease the pain. Last dose last night.   Denies Fever.

## 2021-10-31 ENCOUNTER — Emergency Department (HOSPITAL_COMMUNITY)
Admission: EM | Admit: 2021-10-31 | Discharge: 2021-10-31 | Disposition: A | Discharge status: Home / Self Care | Payer: 59 | Attending: Emergency Medicine | Admitting: Emergency Medicine

## 2021-10-31 ENCOUNTER — Encounter (HOSPITAL_COMMUNITY): Payer: Self-pay | Admitting: Emergency Medicine

## 2021-10-31 ENCOUNTER — Other Ambulatory Visit: Payer: Self-pay

## 2021-10-31 DIAGNOSIS — O2230 Deep phlebothrombosis in pregnancy, unspecified trimester: Secondary | ICD-10-CM

## 2021-10-31 DIAGNOSIS — R2242 Localized swelling, mass and lump, left lower limb: Secondary | ICD-10-CM | POA: Diagnosis present

## 2021-10-31 DIAGNOSIS — Z79899 Other long term (current) drug therapy: Secondary | ICD-10-CM | POA: Diagnosis not present

## 2021-10-31 DIAGNOSIS — J069 Acute upper respiratory infection, unspecified: Secondary | ICD-10-CM | POA: Insufficient documentation

## 2021-10-31 DIAGNOSIS — R600 Localized edema: Secondary | ICD-10-CM

## 2021-10-31 DIAGNOSIS — I10 Essential (primary) hypertension: Secondary | ICD-10-CM | POA: Diagnosis not present

## 2021-10-31 DIAGNOSIS — Z20822 Contact with and (suspected) exposure to covid-19: Secondary | ICD-10-CM | POA: Diagnosis not present

## 2021-10-31 DIAGNOSIS — E119 Type 2 diabetes mellitus without complications: Secondary | ICD-10-CM | POA: Diagnosis not present

## 2021-10-31 DIAGNOSIS — Z87891 Personal history of nicotine dependence: Secondary | ICD-10-CM | POA: Insufficient documentation

## 2021-10-31 LAB — CBC WITH DIFFERENTIAL/PLATELET
Abs Immature Granulocytes: 0.02 10*3/uL (ref 0.00–0.07)
Basophils Absolute: 0 10*3/uL (ref 0.0–0.1)
Basophils Relative: 0 %
Eosinophils Absolute: 0.1 10*3/uL (ref 0.0–0.5)
Eosinophils Relative: 2 %
HCT: 35.7 % — ABNORMAL LOW (ref 36.0–46.0)
Hemoglobin: 10.8 g/dL — ABNORMAL LOW (ref 12.0–15.0)
Immature Granulocytes: 0 %
Lymphocytes Relative: 32 %
Lymphs Abs: 2.4 10*3/uL (ref 0.7–4.0)
MCH: 21.9 pg — ABNORMAL LOW (ref 26.0–34.0)
MCHC: 30.3 g/dL (ref 30.0–36.0)
MCV: 72.3 fL — ABNORMAL LOW (ref 80.0–100.0)
Monocytes Absolute: 0.6 10*3/uL (ref 0.1–1.0)
Monocytes Relative: 8 %
Neutro Abs: 4.3 10*3/uL (ref 1.7–7.7)
Neutrophils Relative %: 58 %
Platelets: 233 10*3/uL (ref 150–400)
RBC: 4.94 MIL/uL (ref 3.87–5.11)
RDW: 16.8 % — ABNORMAL HIGH (ref 11.5–15.5)
WBC: 7.4 10*3/uL (ref 4.0–10.5)
nRBC: 0 % (ref 0.0–0.2)

## 2021-10-31 LAB — RESP PANEL BY RT-PCR (FLU A&B, COVID) ARPGX2
Influenza A by PCR: NEGATIVE
Influenza B by PCR: NEGATIVE
SARS Coronavirus 2 by RT PCR: NEGATIVE

## 2021-10-31 LAB — COMPREHENSIVE METABOLIC PANEL
ALT: 16 U/L (ref 0–44)
AST: 13 U/L — ABNORMAL LOW (ref 15–41)
Albumin: 3.7 g/dL (ref 3.5–5.0)
Alkaline Phosphatase: 58 U/L (ref 38–126)
Anion gap: 8 (ref 5–15)
BUN: 6 mg/dL (ref 6–20)
CO2: 27 mmol/L (ref 22–32)
Calcium: 8.7 mg/dL — ABNORMAL LOW (ref 8.9–10.3)
Chloride: 102 mmol/L (ref 98–111)
Creatinine, Ser: 0.6 mg/dL (ref 0.44–1.00)
GFR, Estimated: >60 mL/min (ref >60)
Glucose, Bld: 94 mg/dL (ref 70–99)
Potassium: 3.8 mmol/L (ref 3.5–5.1)
Sodium: 137 mmol/L (ref 135–145)
Total Bilirubin: 0.3 mg/dL (ref 0.3–1.2)
Total Protein: 7.8 g/dL (ref 6.5–8.1)

## 2021-10-31 MED ORDER — ENOXAPARIN SODIUM 300 MG/3ML IJ SOLN: 1.5000 mg/kg | Freq: Once | INTRAMUSCULAR | Status: DC

## 2021-10-31 MED ORDER — ACETAMINOPHEN 500 MG PO TABS
1000.0000 mg | ORAL_TABLET | Freq: Once | ORAL | Status: AC
  Administered 2021-10-31: 14:00:00 1000 mg via ORAL
  Filled 2021-10-31: qty 2

## 2021-10-31 MED ORDER — ENOXAPARIN SODIUM 300 MG/3ML IJ SOLN
180.0000 mg | Freq: Once | INTRAMUSCULAR | Status: AC
  Administered 2021-10-31: 17:00:00 180 mg via SUBCUTANEOUS
  Filled 2021-10-31: qty 1.8

## 2021-10-31 NOTE — Discharge Instructions (Addendum)
You have a blood clot, started on anticoagulant please take as prescribed.  If you have any sort of trauma i.e. head trauma chest or abdominal trauma you must come back in for reevaluation as this increases risk of bleeding.  Please follow-up hematology for further evaluation.  Come back to the emergency department if you develop chest pain, shortness of breath, severe abdominal pain, uncontrolled nausea, vomiting, diarrhea.

## 2021-10-31 NOTE — ED Notes (Signed)
Unable to visualize skin on leg during exam d/t tight pants, asked patient to change in exam room into gown.

## 2021-10-31 NOTE — ED Notes (Signed)
This RN has called the [facility] to please bring the lovenox

## 2021-10-31 NOTE — ED Notes (Signed)
Message sent to pharmacy via epic [facility] to please verify med.

## 2021-10-31 NOTE — ED Notes (Signed)
Long history of legs swelling , left leg more swollen than right

## 2021-10-31 NOTE — ED Notes (Signed)
This RN has called pharmacy to pleaser verify Lovenox.

## 2021-10-31 NOTE — ED Notes (Signed)
Message sent to pharmacy via epic [facility] to please verify med x 2

## 2021-10-31 NOTE — ED Notes (Signed)
Pt A&OX4 ambulatory at d/c with independent steady gait. Pt verbalized understanding of d/c instructions and follow up care. 

## 2021-10-31 NOTE — ED Provider Notes (Signed)
[facility] EMERGENCY DEPARTMENT Provider Note   CSN: [id number] Arrival date & time: 10/31/21  1127     History Chief Complaint  Patient presents with   Leg Swelling    Shawna Huerta is a 60 y.o. female presents to the emergency department with a chief complaint of swelling to left lower extremity and flulike symptoms.  Patient reported that swelling has been present over the last week.  Swelling has remained unchanged over this time.  Patient was seen by PCP who prescribed Lasix medication.  Patient has had no improvement in swelling with Lasix.  Patient developed pain to left calf earlier this morning.  Pain is worse with touch and movement.  Patient rates pain 4/10 on pain scale.  Patient has had some improvement in the pain with Tylenol.  Patient denies any history of DVT or PE, shortness of breath, chest pain, palpitations, lightheadedness, syncope, PND, orthopnea.  Denies any traumatic injury or falls.  Patient reports that she is also having headaches, nasal congestion, chills, nonproductive cough, and diarrhea.  Patient reports her symptoms started on 12/1.  Symptoms have been present since then.  Patient has had some relief of symptoms with over-the-counter cough and cold medication.  Denies any known sick contacts.  HPI     Past Medical History:  Diagnosis Date   Anemia    Arthritis    Bartholin cyst 07/03/2014   Colitis 08/2015   Treated at [facility]   Diabetes mellitus    Hypertension    Labial abscess 08/2015   Status post I&D by Dr. Ladona Horns   Nicotine addiction 09/03/2013   Obesity    Pancreatitis 09/2015   unknown etiology, no further work-up with gastroenterology    Vaginal itching 09/03/2013    Patient Active Problem List   Diagnosis Date Noted   ARF (acute renal failure) ([place]) 07/05/2019   Abdominal pain 07/05/2019   Enteritis 07/05/2019   Colon adenomas 12/28/2016   Normocytic anemia 12/28/2016   Postviral gastroparesis    Type 2 diabetes  mellitus ([place]) 10/23/2016   AKI (acute kidney injury) ([place]) 10/23/2016   IBS (irritable bowel syndrome) 10/12/2015   Acute pancreatitis 10/10/2015   Diarrhea 10/10/2015   Non-intractable vomiting with nausea 10/10/2015   Type 2 diabetes mellitus without complication ([place]) 93/26/7124   Labial abscess 10/10/2015   Hyponatremia 10/10/2015   Essential hypertension 10/10/2015   Tobacco abuse 10/10/2015   Bartholin cyst 07/03/2014   Diabetes ([place]) 09/03/2013   Obesity 09/03/2013   Vaginal itching 09/03/2013   Nicotine addiction 09/03/2013   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   TENOSYNOVITIS OF FOOT AND ANKLE 09/22/2010   FOOT PAIN, BILATERAL 09/22/2010    Past Surgical History:  Procedure Laterality Date   ABDOMINAL HYSTERECTOMY     ABDOMINAL SURGERY     exploratry with TAH   BALLOON DILATION N/A 10/26/2016   Procedure: Pyloric channel BALLOON DILATION;  Surgeon: Daneil Dolin, MD;  Location: [facility];  Service: Endoscopy;  Laterality: N/A;   BARTHOLIN GLAND CYST EXCISION Right 08/20/2014   Procedure: EXCISION OF RIGHT BARTHOLIN'S GLAND CYST;  Surgeon: Florian Buff, MD;  Location: [facility] ORS;  Service: Gynecology;  Laterality: Right;   CHOLECYSTECTOMY     COLONOSCOPY  10/2015   Doristine Mango: mild diverticulosis, at least 8 polpys removed, multiple tubular adenomas. next tcs in 1 year   ESOPHAGOGASTRODUODENOSCOPY N/A 10/26/2016   Procedure: ESOPHAGOGASTRODUODENOSCOPY (EGD);  Surgeon: Daneil Dolin, MD;  Location: [facility];  Service: Endoscopy;  Laterality: N/A;   FOOT SURGERY     FOOT SURGERY Right 2011   shave bone   TONSILLECTOMY       OB History     Gravida  2   Para  1   Term  1   Preterm      AB  1   Living  1      SAB  1   IAB      Ectopic      Multiple      Live Births              Family History  Problem Relation Age of Onset   Diabetes Mother    Stroke Mother    Cancer Father        liver and colon. Patient unsure  primary    Stomach cancer Father    Diabetes Sister    Diabetes Brother    Lung cancer Brother     Social History   Tobacco Use   Smoking status: Former    Packs/day: 0.25    Years: 3.00    Pack years: 0.75    Types: Cigarettes    Quit date: 10/21/2016    Years since quitting: 5.0   Smokeless tobacco: Never  Substance Use Topics   Alcohol use: No    Comment: occ.   Drug use: No    Home Medications Prior to Admission medications   Medication Sig Start Date End Date Taking? Authorizing Provider  furosemide (LASIX) 20 MG tablet Take 1-2 tablets daily for 5 days. 10/27/21   Vanessa Kick, MD  glyBURIDE-metformin (GLUCOVANCE) 5-500 MG per tablet Take 1 tablet by mouth 3 (three) times daily.     [provider]  lisinopril (ZESTRIL) 10 MG tablet Take 1 tablet (10 mg total) by mouth daily. 07/10/19   Murlean Iba, MD    Allergies    Iohexol  Review of Systems   Review of Systems  Constitutional:  Positive for chills. Negative for fever.  HENT:  Positive for congestion and rhinorrhea. Negative for sore throat.   Eyes:  Negative for visual disturbance.  Respiratory:  Positive for cough. Negative for shortness of breath.   Cardiovascular:  Positive for leg swelling. Negative for chest pain and palpitations.  Gastrointestinal:  Positive for diarrhea. Negative for abdominal pain, nausea and vomiting.  Genitourinary:  Negative for difficulty urinating and dysuria.  Musculoskeletal:  Negative for back pain and neck pain.  Skin:  Negative for color change and rash.  Neurological:  Positive for headaches. Negative for dizziness, tremors, seizures, syncope, facial asymmetry, speech difficulty, weakness, light-headedness and numbness.  Psychiatric/Behavioral:  Negative for confusion.    Physical Exam Updated Vital Signs BP (!) 180/85   Pulse 80   Temp 97.8 F (36.6 C) (Oral)   Resp 20   Ht 5\' 7"  (1.702 m)   SpO2 99%   BMI 21.68 kg/m   Physical Exam Vitals  and nursing note reviewed.  Constitutional:      General: She is not in acute distress.    Appearance: She is obese. She is not ill-appearing, toxic-appearing or diaphoretic.  HENT:     Head: Normocephalic.     Nose:     Right Sinus: No maxillary sinus tenderness or frontal sinus tenderness.     Left Sinus: No maxillary sinus tenderness or frontal sinus tenderness.  Eyes:     General: No scleral icterus.       Right eye: No discharge.  Left eye: No discharge.  Cardiovascular:     Rate and Rhythm: Normal rate.  Pulmonary:     Effort: Pulmonary effort is normal. No tachypnea, bradypnea or respiratory distress.     Breath sounds: Normal breath sounds. No stridor.  Abdominal:     General: Abdomen is protuberant. There is no distension. There are no signs of injury.     Palpations: Abdomen is soft. There is pulsatile mass. There is no mass.     Tenderness: There is no abdominal tenderness. There is no guarding or rebound.  Musculoskeletal:     Cervical back: Neck supple.     Right lower leg: No swelling, deformity, lacerations, tenderness or bony tenderness. No edema.     Left lower leg: No deformity, lacerations, tenderness or bony tenderness. Edema present.     Comments: Edema, tenderness, and warmth to left calf.  No erythema or rash noted.  Skin:    General: Skin is warm and dry.  Neurological:     General: No focal deficit present.     Mental Status: She is alert.  Psychiatric:        Behavior: Behavior is cooperative.    ED Results / Procedures / Treatments   Labs (all labs ordered are listed, but only abnormal results are displayed) Labs Reviewed  CBC WITH DIFFERENTIAL/PLATELET - Abnormal; Notable for the following components:      Result Value   Hemoglobin 10.8 (*)    HCT 35.7 (*)    MCV 72.3 (*)    MCH 21.9 (*)    RDW 16.8 (*)    All other components within normal limits  COMPREHENSIVE METABOLIC PANEL - Abnormal; Notable for the following components:    Calcium 8.7 (*)    AST 13 (*)    All other components within normal limits  RESP PANEL BY RT-PCR (FLU A&B, COVID) ARPGX2    EKG None  Radiology No results found.  Procedures Procedures   Medications Ordered in ED Medications  enoxaparin (LOVENOX) 100 mg/mL injection 1.5 mg/kg (has no administration in time range)  acetaminophen (TYLENOL) tablet 1,000 mg (1,000 mg Oral Given 10/31/21 1428)    ED Course  I have reviewed the triage vital signs and the nursing notes.  Pertinent labs & imaging results that were available during my care of the patient were reviewed by me and considered in my medical decision making (see chart for details).    MDM Rules/Calculators/A&P                           Alert 60 year old female no acute distress, nontoxic-appearing.  Presents emergency department with chief complaint of left lower leg swelling and flulike symptoms.  Flulike symptoms include headache, nasal congestion, chills, nonproductive cough, and diarrhea.  Symptoms started on 12/1.  Denies any known sick contacts.  Lungs clear to auscultation bilaterally, low suspicion for pneumonia at this time.  We will swab patient for COVID-19 and influenza.  Respiratory panel negative for COVID-19 and influenza.  Suspect that patient's symptoms are likely due to viral upper respiratory infection.  Discussed symptomatic treatment with patient.  Patient reports swelling to left lower extremity has been present over the last week.  Swelling is remained unchanged over this time.  Patient had no improvement in symptoms after taking Lasix prescribed by PCP.  Patient has now developed tenderness to left lower extremity.  Denies any recent falls or injuries.  No recent prolonged immobilization.  On  exam patient has edema, swelling, and warmth to left calf.  No rash or erythema noted.  Concern for possible DVT.  Will obtain CBC and CMP to be prepared if patient needs to be started on anticoagulation for DVT.   Informed by RN that ultrasound imaging is not available at this facility today.  We will give patient dose of Lovenox and have her follow-up tomorrow for ultrasound imaging.  Patient denies any history of intracranial hemorrhage or GI bleed.  CBC shows slight anemia with hemoglobin at 10.8  Discussed importance of follow-up for ultrasound imaging.  Patient expresses understanding of need for imaging to rule out DVT.  While in the emergency department patient noted to be hypertensive.  States that she takes lisinopril medication pretty milligrams once daily.  Patient reports that she has been compliant with this medication.  We will have patient follow-up with primary care provider for further management of hypertension.  Discussed results, findings, treatment and follow up. Patient advised of return precautions. Patient verbalized understanding and agreed with plan.   Final Clinical Impression(s) / ED Diagnoses Final diagnoses:  Left leg swelling  Hypertension, unspecified type  Viral URI    Rx / DC Orders ED Discharge Orders          Ordered    US Venous Img Lower Unilateral Left        10/31/21 1547             Loni Beckwith, PA-C 11/01/21 0304    Fredia Sorrow, MD 11/04/21 0930

## 2021-10-31 NOTE — ED Triage Notes (Signed)
Returned for L leg swelling since 11/30. Was given lasix at [facility] which improved swelling very little. Today, L leg swelling is worse and her leg feels heavy. Reports subjective fever, bruising on leg on shin.  H/o diabetes, htn.

## 2021-11-01 ENCOUNTER — Ambulatory Visit (HOSPITAL_COMMUNITY)
Admission: RE | Admit: 2021-11-01 | Discharge: 2021-11-01 | Disposition: A | Discharge status: Home / Self Care | Payer: 59 | Source: Ambulatory Visit | Attending: Emergency Medicine | Admitting: Emergency Medicine

## 2021-11-01 ENCOUNTER — Telehealth (HOSPITAL_COMMUNITY): Payer: Self-pay | Admitting: Student

## 2021-11-01 DIAGNOSIS — R600 Localized edema: Secondary | ICD-10-CM | POA: Diagnosis present

## 2021-11-01 MED ORDER — APIXABAN 5 MG PO TABS
ORAL_TABLET | ORAL | 0 refills | Status: DC
Start: 2021-11-01 — End: 2022-06-17

## 2021-11-01 NOTE — Telephone Encounter (Signed)
Patient was seen yesterday for leg swelling and leg pain, lab work was obtained and was unremarkable, concerning for possible DVT unfortunately DVT was unavailable at the time she was struck to come back today for a DVT study.  Lovenox was given.  Results were reviewed shows that she has DVT noted in the left femoral popliteal and posterior tibial and peroneal veins.  I discussed findings with patient all questions were answered.  Explained that she will have to be on anticoagulant she is agreement this plan risks and benefits were discussed.  She has no history of internal head bleeds, GI bleeds, known history of antecolic disorders.  Patient was seen in person and was noted to be hemodynamically stable had no complaints vital signs are reassuring.  Telephone note was performed because I am unable to send in prescription since she has been discharged greater than 2 hours ago.  Will order anticoagulant  will have her follow-up with hematology for further evaluation.

## 2021-11-11 NOTE — Progress Notes (Signed)
[place], [place] [address]   CLINIC:  Medical Oncology/Hematology  Patient Care Team: Health, [facility] as PCP - General  CHIEF COMPLAINTS/PURPOSE OF CONSULTATION:  Evaluation of of left leg DVT  HISTORY OF PRESENTING ILLNESS:  Shawna Huerta 60 y.o. female is here because of evaluation of DVT, at the request of the ED.  Today she reports feeling good. She denies any recent injury to her left leg, long car trips or flights, or being bed-bound for extended periods of time. She denies fevers and unintentional weight loss. She reports occasional hot flashes. She denies personal history of cancer. She has had 1 previous miscarriage 6 months into the pregnancy. She denies any current bleeding and black stools. She is not currently taking iron supplements. She reports the swelling in her left leg has improved, and she has now develop numbness and tingling in her left calf. She denies CP, SOB, and skin rash. She denies personal history of lupus and RA.   She lives at home, and she is able to do all of her typical home activities unassisted. She has been on disability since April 2022 due to a history of right leg issues, and previously she worked for a Conservation officer, [employer]. She quit smoking 1 year ago after smoking 1/2 ppd for 6 years. She denies family history of DVT and PE. Her brother and father had lung cancer, and they both had smoking history. She denies family history of sickle cell disease and thalassemia.   MEDICAL HISTORY:  Past Medical History:  Diagnosis Date   Anemia    Arthritis    Bartholin cyst 07/03/2014   Colitis 08/2015   Treated at [facility]   Diabetes mellitus    Hypertension    Labial abscess 08/2015   Status post I&D by Dr. Ladona Horns   Nicotine addiction 09/03/2013   Obesity    Pancreatitis 09/2015   unknown etiology, no further work-up with gastroenterology    Vaginal itching 09/03/2013    SURGICAL HISTORY: Past  Surgical History:  Procedure Laterality Date   ABDOMINAL HYSTERECTOMY     ABDOMINAL SURGERY     exploratry with TAH   BALLOON DILATION N/A 10/26/2016   Procedure: Pyloric channel BALLOON DILATION;  Surgeon: Daneil Dolin, MD;  Location[facility];  Service: Endoscopy;  Laterality: N/A;   BARTHOLIN GLAND CYST EXCISION Right 08/20/2014   Procedure: EXCISION OF RIGHT BARTHOLIN'S GLAND CYST;  Surgeon: Florian Buff, MD;  Location: [facility] ORS;  Service: Gynecology;  Laterality: Right;   CHOLECYSTECTOMY     COLONOSCOPY  10/2015   Doristine Mango: mild diverticulosis, at least 8 polpys removed, multiple tubular adenomas. next tcs in 1 year   ESOPHAGOGASTRODUODENOSCOPY N/A 10/26/2016   Procedure: ESOPHAGOGASTRODUODENOSCOPY (EGD);  Surgeon: Daneil Dolin, MD;  Location: [facility];  Service: Endoscopy;  Laterality: N/A;   FOOT SURGERY     FOOT SURGERY Right 2011   shave bone   TONSILLECTOMY      SOCIAL HISTORY: Social History   Socioeconomic History   Marital status: Legally Separated    Spouse name: Not on file   Number of children: Not on file   Years of education: Not on file   Highest education level: Not on file  Occupational History   Not on file  Tobacco Use   Smoking status: Former    Packs/day: 0.25    Years: 3.00    Pack years: 0.75  Types: Cigarettes    Quit date: 10/21/2016    Years since quitting: 5.0   Smokeless tobacco: Never  Substance and Sexual Activity   Alcohol use: No    Comment: occ.   Drug use: No   Sexual activity: Not Currently    Birth control/protection: Surgical  Other Topics Concern   Not on file  Social History Narrative   Not on file   Social Determinants of Health   Financial Resource Strain: Not on file  Food Insecurity: Not on file  Transportation Needs: Not on file  Physical Activity: Not on file  Stress: Not on file  Social Connections: Not on file  Intimate Partner Violence: Not on file    FAMILY HISTORY: Family  History  Problem Relation Age of Onset   Diabetes Mother    Stroke Mother    Cancer Father        liver and colon. Patient unsure primary    Stomach cancer Father    Diabetes Sister    Diabetes Brother    Lung cancer Brother     ALLERGIES:  is allergic to iohexol.  MEDICATIONS:  Current Outpatient Medications  Medication Sig Dispense Refill   apixaban (ELIQUIS) 5 MG TABS tablet Take 2 tablets (10mg ) twice daily for 7 days, then 1 tablet (5mg ) twice daily 60 tablet 0   glyBURIDE-metformin (GLUCOVANCE) 5-500 MG per tablet Take 1 tablet by mouth 3 (three) times daily.      lisinopril (ZESTRIL) 10 MG tablet Take 1 tablet (10 mg total) by mouth daily.  1   No current facility-administered medications for this visit.    REVIEW OF SYSTEMS:   Review of Systems  Constitutional:  Negative for appetite change (60%), fatigue (60%), fever and unexpected weight change.  HENT:   Negative for nosebleeds.   Respiratory:  Negative for shortness of breath.   Cardiovascular:  Negative for chest pain and leg swelling (improved).  Gastrointestinal:  Negative for blood in stool.  Endocrine: Positive for hot flashes.  Genitourinary:  Negative for hematuria.   Musculoskeletal:  Positive for arthralgias (5/10 L leg).  Skin:  Negative for rash.  Neurological:  Positive for numbness (L leg).  Hematological:  Does not bruise/bleed easily.  All other systems reviewed and are negative.   PHYSICAL EXAMINATION: ECOG PERFORMANCE STATUS: 0 - Asymptomatic  Vitals:   11/12/21 1322  BP: (!) 186/84  Pulse: 98  Resp: 18  Temp: 98.3 F (36.8 C)  SpO2: 100%   Filed Weights   11/12/21 1322  Weight: 257 lb 6.4 oz (116.8 kg)   Physical Exam Vitals reviewed.  Constitutional:      Appearance: Normal appearance.  Cardiovascular:     Rate and Rhythm: Normal rate and regular rhythm.     Pulses: Normal pulses.     Heart sounds: Normal heart sounds.  Pulmonary:     Effort: Pulmonary effort is normal.      Breath sounds: Normal breath sounds.  Abdominal:     Palpations: Abdomen is soft. There is no hepatomegaly, splenomegaly or mass.     Tenderness: There is no abdominal tenderness.  Musculoskeletal:     Left lower leg: Swelling and tenderness present. Edema (trace) present.  Lymphadenopathy:     Cervical: No cervical adenopathy.     Right cervical: No superficial cervical adenopathy.    Left cervical: No superficial cervical adenopathy.     Upper Body:     Right upper body: No supraclavicular, axillary or pectoral adenopathy.  Left upper body: No supraclavicular, axillary or pectoral adenopathy.  Skin:    Comments: Hyperpigmentation in lower front of L leg  Neurological:     General: No focal deficit present.     Mental Status: She is alert and oriented to person, place, and time.  Psychiatric:        Mood and Affect: Mood normal.        Behavior: Behavior normal.     LABORATORY DATA:  I have reviewed the data as listed Recent Results (from the past 2160 hour(s))  CBC with Differential     Status: Abnormal   Collection Time: 10/31/21  2:05 PM  Result Value Ref Range   WBC 7.4 4.0 - 10.5 K/uL   RBC 4.94 3.87 - 5.11 MIL/uL   Hemoglobin 10.8 (L) 12.0 - 15.0 g/dL   HCT 35.7 (L) 36.0 - 46.0 %   MCV 72.3 (L) 80.0 - 100.0 fL   MCH 21.9 (L) 26.0 - 34.0 pg   MCHC 30.3 30.0 - 36.0 g/dL   RDW 16.8 (H) 11.5 - 15.5 %   Platelets 233 150 - 400 K/uL   nRBC 0.0 0.0 - 0.2 %   Neutrophils Relative % 58 %   Neutro Abs 4.3 1.7 - 7.7 K/uL   Lymphocytes Relative 32 %   Lymphs Abs 2.4 0.7 - 4.0 K/uL   Monocytes Relative 8 %   Monocytes Absolute 0.6 0.1 - 1.0 K/uL   Eosinophils Relative 2 %   Eosinophils Absolute 0.1 0.0 - 0.5 K/uL   Basophils Relative 0 %   Basophils Absolute 0.0 0.0 - 0.1 K/uL   Immature Granulocytes 0 %   Abs Immature Granulocytes 0.02 0.00 - 0.07 K/uL    Comment: Performed at [facility], [address], [place], [place] [address]  Comprehensive metabolic panel      Status: Abnormal   Collection Time: 10/31/21  2:05 PM  Result Value Ref Range   Sodium 137 135 - 145 mmol/L   Potassium 3.8 3.5 - 5.1 mmol/L   Chloride 102 98 - 111 mmol/L   CO2 27 22 - 32 mmol/L   Glucose, Bld 94 70 - 99 mg/dL    Comment: Glucose reference range applies only to samples taken after fasting for at least 8 hours.   BUN 6 6 - 20 mg/dL   Creatinine, Ser 0.60 0.44 - 1.00 mg/dL   Calcium 8.7 (L) 8.9 - 10.3 mg/dL   Total Protein 7.8 6.5 - 8.1 g/dL   Albumin 3.7 3.5 - 5.0 g/dL   AST 13 (L) 15 - 41 U/L   ALT 16 0 - 44 U/L   Alkaline Phosphatase 58 38 - 126 U/L   Total Bilirubin 0.3 0.3 - 1.2 mg/dL   GFR, Estimated >60 >60 mL/min    Comment: (NOTE) Calculated using the CKD-EPI Creatinine Equation (2021)    Anion gap 8 5 - 15    Comment: Performed at [facility], [address], [place], [place] [address]  Resp Panel by RT-PCR (Flu A&B, Covid) Nasopharyngeal Swab     Status: None   Collection Time: 10/31/21  2:15 PM   Specimen: Nasopharyngeal Swab; Nasopharyngeal(NP) swabs in vial transport medium  Result Value Ref Range   SARS Coronavirus 2 by RT PCR NEGATIVE NEGATIVE    Comment: (NOTE) SARS-CoV-2 target nucleic acids are NOT DETECTED.  The SARS-CoV-2 RNA is generally detectable in upper respiratory specimens during the acute phase of infection. The lowest concentration of SARS-CoV-2 viral copies  this assay can detect is 138 copies/mL. A negative result does not preclude SARS-Cov-2 infection and should not be used as the sole basis for treatment or other patient management decisions. A negative result may occur with  improper specimen collection/handling, submission of specimen other than nasopharyngeal swab, presence of viral mutation(s) within the areas targeted by this assay, and inadequate number of viral copies(<138 copies/mL). A negative result must be combined with clinical observations, patient history, and epidemiological information. The expected result  is Negative.  Fact Sheet for Patients:  EntrepreneurPulse.com.au  Fact Sheet for Healthcare Providers:  IncredibleEmployment.be  This test is no t yet approved or cleared by the [place] FDA and  has been authorized for detection and/or diagnosis of SARS-CoV-2 by FDA under an Emergency Use Authorization (EUA). This EUA will remain  in effect (meaning this test can be used) for the duration of the COVID-19 declaration under Section 564(b)(1) of the Act, 21 U.S.C.section 360bbb-3(b)(1), unless the authorization is terminated  or revoked sooner.       Influenza A by PCR NEGATIVE NEGATIVE   Influenza B by PCR NEGATIVE NEGATIVE    Comment: (NOTE) The Xpert Xpress SARS-CoV-2/FLU/RSV plus assay is intended as an aid in the diagnosis of influenza from Nasopharyngeal swab specimens and should not be used as a sole basis for treatment. Nasal washings and aspirates are unacceptable for Xpert Xpress SARS-CoV-2/FLU/RSV testing.  Fact Sheet for Patients: EntrepreneurPulse.com.au  Fact Sheet for Healthcare Providers: IncredibleEmployment.be  This test is not yet approved or cleared by the [place] FDA and has been authorized for detection and/or diagnosis of SARS-CoV-2 by FDA under an Emergency Use Authorization (EUA). This EUA will remain in effect (meaning this test can be used) for the duration of the COVID-19 declaration under Section 564(b)(1) of the Act, 21 U.S.C. section 360bbb-3(b)(1), unless the authorization is terminated or revoked.  Performed at [facility], [address], [place], [place] [address]     RADIOGRAPHIC STUDIES: I have personally reviewed the radiological images as listed and agreed with the findings in the report. US Venous Img Lower Unilateral Left (DVT)  Addendum Date: 11/01/2021   ADDENDUM REPORT: 11/01/2021 11:53 ADDENDUM: These results will be called to the ordering  clinician or representative by the Radiologist Assistant, and communication documented in the PACS or zVision Dashboard. Electronically Signed   By: Marijo Conception M.D.   On: 11/01/2021 11:53   Result Date: 11/01/2021 CLINICAL DATA:  Left lower extremity pain. EXAM: Left LOWER EXTREMITY VENOUS DOPPLER ULTRASOUND TECHNIQUE: Gray-scale sonography with graded compression, as well as color Doppler and duplex ultrasound were performed to evaluate the lower extremity deep venous systems from the level of the common femoral vein and including the common femoral, femoral, profunda femoral, popliteal and calf veins including the posterior tibial, peroneal and gastrocnemius veins when visible. The superficial great saphenous vein was also interrogated. Spectral Doppler was utilized to evaluate flow at rest and with distal augmentation maneuvers in the common femoral, femoral and popliteal veins. COMPARISON:  None. FINDINGS: Contralateral Common Femoral Vein: Respiratory phasicity is normal and symmetric with the symptomatic side. No evidence of thrombus. Normal compressibility. Common Femoral Vein: No evidence of thrombus. Normal compressibility, respiratory phasicity and response to augmentation. Saphenofemoral Junction: No evidence of thrombus. Normal compressibility and flow on color Doppler imaging. Profunda Femoral Vein: No evidence of thrombus. Normal compressibility and flow on color Doppler imaging. Femoral Vein: Distal portion is noncompressible consistent with thrombus. Popliteal Vein: Nonocclusive thrombus is noted.  Calf Veins: Occlusive thrombus is noted within the posterior tibial and peroneal veins. Superficial Great Saphenous Vein: No evidence of thrombus. Normal compressibility. Venous Reflux:  None. Other Findings:  None. IMPRESSION: Deep venous thrombosis is noted in the left femoral, popliteal, posterior tibial and peroneal veins. Electronically Signed: By: Marijo Conception M.D. On: 11/01/2021 11:04     ASSESSMENT:  Unprovoked left leg DVT: - She noticed left leg swelling 4 to 5 days prior to ER visit, was seen in urgent care. - Left leg ultrasound on 11/01/2021 with DVT noted in the left femoral, popliteal, posterior tibial and peroneal veins. - She is currently tolerating Eliquis very well. - She does not have any B symptoms.  She had 1 miscarriage, at 75-month gestation. - Colonoscopy in 2016 with mild diverticulosis and multiple subcentimeter polyps removed.  EGD in 2017 showing esophagitis. - Last mammogram on 10/03/2011, normal.   Social/family history: - She lives with her family at home.  She has been on disability since April 2022 secondary to right leg problems.  She is able to do all her day-to-day activities and is active.  She worked in a Conservation officer, nature.  She quit smoking 1 year ago.  She smoked half pack per day for 6 years. - Father and brother died of lung cancer, both were smokers.  No family history of thrombosis.   PLAN:  Unprovoked left leg DVT: - We have discussed the results of left leg Doppler.  She has unprovoked DVT in the left leg based on history. - At this point I would recommend at least 6 months of anticoagulation and reevaluate.  She will likely benefit from long-term anticoagulation.  2.  Microcytic anemia: - She has microcytosis over the last several years even with her normal hemoglobin. - She likely has an underlying thalassemia/sickle cell trait. - Recent CBC on 10/31/2021 showed her hemoglobin to be 10.8. - We will check ferritin, iron panel, J69, folic acid, methylmalonic acid, copper, LDH and reticulocyte count.  We will also check SPEP. - RTC 4 weeks for follow-up.  3.  Health maintenance: - I have recommended mammogram as last mammogram was in 2012.  She is agreeable.  We have scheduled it.   All questions were answered. The patient knows to call the clinic with any problems, questions or concerns.  Derek Jack, MD 11/12/21 1:45 PM   [place] [phone]   I, Thana Ates, am acting as a scribe for Dr. Derek Jack.  I, Derek Jack MD, have reviewed the above documentation for accuracy and completeness, and I agree with the above.

## 2021-11-12 ENCOUNTER — Inpatient Hospital Stay (HOSPITAL_COMMUNITY): Payer: 59

## 2021-11-12 ENCOUNTER — Other Ambulatory Visit: Payer: Self-pay

## 2021-11-12 ENCOUNTER — Inpatient Hospital Stay (HOSPITAL_COMMUNITY): Payer: 59 | Attending: Hematology | Admitting: Hematology

## 2021-11-12 VITALS — BP 186/84 | HR 98 | Temp 98.3°F | Resp 18 | Ht 67.0 in | Wt 257.4 lb

## 2021-11-12 DIAGNOSIS — I82432 Acute embolism and thrombosis of left popliteal vein: Secondary | ICD-10-CM | POA: Diagnosis present

## 2021-11-12 DIAGNOSIS — D649 Anemia, unspecified: Secondary | ICD-10-CM | POA: Diagnosis not present

## 2021-11-12 DIAGNOSIS — Z7901 Long term (current) use of anticoagulants: Secondary | ICD-10-CM | POA: Diagnosis not present

## 2021-11-12 DIAGNOSIS — I824Y2 Acute embolism and thrombosis of unspecified deep veins of left proximal lower extremity: Secondary | ICD-10-CM

## 2021-11-12 DIAGNOSIS — Z1231 Encounter for screening mammogram for malignant neoplasm of breast: Secondary | ICD-10-CM

## 2021-11-12 DIAGNOSIS — I82402 Acute embolism and thrombosis of unspecified deep veins of left lower extremity: Secondary | ICD-10-CM | POA: Insufficient documentation

## 2021-11-12 LAB — IRON AND TIBC
Iron: 69 ug/dL (ref 28–170)
Saturation Ratios: 25 % (ref 10.4–31.8)
TIBC: 275 ug/dL (ref 250–450)
UIBC: 206 ug/dL

## 2021-11-12 LAB — RETICULOCYTES
Immature Retic Fract: 18 % — ABNORMAL HIGH (ref 2.3–15.9)
RBC.: 4.94 MIL/uL (ref 3.87–5.11)
Retic Count, Absolute: 50.9 10*3/uL (ref 19.0–186.0)
Retic Ct Pct: 1 % (ref 0.4–3.1)

## 2021-11-12 LAB — FOLATE: Folate: 6.7 ng/mL (ref >5.9)

## 2021-11-12 LAB — LACTATE DEHYDROGENASE: LDH: 136 U/L (ref 98–192)

## 2021-11-12 LAB — VITAMIN B12: Vitamin B-12: 280 pg/mL (ref 180–914)

## 2021-11-12 LAB — D-DIMER, QUANTITATIVE: D-Dimer, Quant: 1.95 ug/mL-FEU — ABNORMAL HIGH (ref 0.00–0.50)

## 2021-11-12 LAB — FERRITIN: Ferritin: 217 ng/mL (ref 11–307)

## 2021-11-12 NOTE — Patient Instructions (Addendum)
[place] at [facility] Discharge Instructions  You were seen and examined today by Dr. Delton Coombes. He is a Merchandiser, retail meaning that he specializes in the treatment of blood disorders and cancers. He discussed your medical history and the events that led up to you being here today. He recommends checking further labs to see if there is any clotting factors that may have caused the blood clot. He recommends that you continue the blood thinner for at least 6 months and then we can decide based off of lab results and ultrasound to see if clot is gone. We will get you scheduled for a screening mammogram. Please keep follow up appointment as scheduled.    Thank you for choosing [place] at [facility] to provide your oncology and hematology care.  To afford each patient quality time with our provider, please arrive at least 15 minutes before your scheduled appointment time.   If you have a lab appointment with the [facility] please come in thru the Main Entrance and check in at the main information desk.  You need to re-schedule your appointment should you arrive 10 or more minutes late.  We strive to give you quality time with our providers, and arriving late affects you and other patients whose appointments are after yours.  Also, if you no show three or more times for appointments you may be dismissed from the clinic at the providers discretion.     Again, thank you for choosing [facility].  Our hope is that these requests will decrease the amount of time that you wait before being seen by our physicians.       _____________________________________________________________  Should you have questions after your visit to [facility], please contact our office at [phone] and follow the prompts.  Our office hours are 8:00 a.m. and 4:30 p.m. Monday - Friday.  Please note that voicemails left after 4:00  p.m. may not be returned until the following business day.  We are closed weekends and major holidays.  You do have access to a nurse 24-7, just call the main number to the clinic [phone] and do not press any options, hold on the line and a nurse will answer the phone.    For prescription refill requests, have your pharmacy contact our office and allow 72 hours.    Due to Covid, you will need to wear a mask upon entering the hospital. If you do not have a mask, a mask will be given to you at the Main Entrance upon arrival. For doctor visits, patients may have 1 support person age 64 or older with them. For treatment visits, patients can not have anyone with them due to social distancing guidelines and our immunocompromised population.

## 2021-11-14 LAB — METHYLMALONIC ACID, SERUM: Methylmalonic Acid, Quantitative: 90 nmol/L (ref 0–378)

## 2021-11-17 ENCOUNTER — Ambulatory Visit (HOSPITAL_COMMUNITY)
Admission: RE | Admit: 2021-11-17 | Discharge: 2021-11-17 | Disposition: A | Discharge status: Home / Self Care | Payer: 59 | Source: Ambulatory Visit | Attending: Hematology | Admitting: Hematology

## 2021-11-17 ENCOUNTER — Other Ambulatory Visit: Payer: Self-pay

## 2021-11-17 DIAGNOSIS — Z1231 Encounter for screening mammogram for malignant neoplasm of breast: Secondary | ICD-10-CM | POA: Insufficient documentation

## 2021-11-17 DIAGNOSIS — I824Y2 Acute embolism and thrombosis of unspecified deep veins of left proximal lower extremity: Secondary | ICD-10-CM | POA: Insufficient documentation

## 2021-11-17 LAB — PROTEIN ELECTROPHORESIS, SERUM
A/G Ratio: 0.8 (ref 0.7–1.7)
Albumin ELP: 3.6 g/dL (ref 2.9–4.4)
Alpha-1-Globulin: 0.3 g/dL (ref 0.0–0.4)
Alpha-2-Globulin: 1.1 g/dL — ABNORMAL HIGH (ref 0.4–1.0)
Beta Globulin: 1.3 g/dL (ref 0.7–1.3)
Gamma Globulin: 1.9 g/dL — ABNORMAL HIGH (ref 0.4–1.8)
Globulin, Total: 4.5 g/dL — ABNORMAL HIGH (ref 2.2–3.9)
Total Protein ELP: 8.1 g/dL (ref 6.0–8.5)

## 2021-11-17 LAB — COPPER, SERUM: Copper: 137 ug/dL (ref 80–158)

## 2021-11-18 ENCOUNTER — Other Ambulatory Visit (HOSPITAL_COMMUNITY): Payer: Self-pay | Admitting: Hematology

## 2021-11-18 DIAGNOSIS — R928 Other abnormal and inconclusive findings on diagnostic imaging of breast: Secondary | ICD-10-CM

## 2021-12-14 ENCOUNTER — Other Ambulatory Visit: Payer: Self-pay

## 2021-12-14 ENCOUNTER — Ambulatory Visit (HOSPITAL_COMMUNITY)
Admission: RE | Admit: 2021-12-14 | Discharge: 2021-12-14 | Disposition: A | Discharge status: Home / Self Care | Payer: 59 | Source: Ambulatory Visit | Attending: Hematology | Admitting: Hematology

## 2021-12-14 DIAGNOSIS — R928 Other abnormal and inconclusive findings on diagnostic imaging of breast: Secondary | ICD-10-CM | POA: Diagnosis present

## 2021-12-15 ENCOUNTER — Ambulatory Visit (HOSPITAL_COMMUNITY)
Admission: RE | Admit: 2021-12-15 | Discharge: 2021-12-15 | Disposition: A | Discharge status: Home / Self Care | Payer: 59 | Source: Ambulatory Visit | Attending: Hematology | Admitting: Hematology

## 2021-12-15 ENCOUNTER — Encounter (HOSPITAL_COMMUNITY): Payer: Self-pay | Admitting: Hematology

## 2021-12-15 ENCOUNTER — Inpatient Hospital Stay (HOSPITAL_COMMUNITY): Payer: 59 | Attending: Hematology | Admitting: Hematology

## 2021-12-15 ENCOUNTER — Ambulatory Visit (HOSPITAL_COMMUNITY): Payer: 59

## 2021-12-15 VITALS — BP 144/69 | HR 78 | Temp 97.3°F | Resp 18 | Wt 256.4 lb

## 2021-12-15 DIAGNOSIS — I824Y2 Acute embolism and thrombosis of unspecified deep veins of left proximal lower extremity: Secondary | ICD-10-CM

## 2021-12-15 NOTE — Patient Instructions (Addendum)
[place] at [facility] Discharge Instructions   You were seen and examined today by Dr. Delton Coombes.  We will repeat an ultrasound of your left leg to rule out a new clot or worsening of the clot you already have.  We will call you with the results and schedule follow up at that time.       Thank you for choosing [place] at [facility] to provide your oncology and hematology care.  To afford each patient quality time with our provider, please arrive at least 15 minutes before your scheduled appointment time.   If you have a lab appointment with the [facility] please come in thru the Main Entrance and check in at the main information desk.  You need to re-schedule your appointment should you arrive 10 or more minutes late.  We strive to give you quality time with our providers, and arriving late affects you and other patients whose appointments are after yours.  Also, if you no show three or more times for appointments you may be dismissed from the clinic at the providers discretion.     Again, thank you for choosing [facility].  Our hope is that these requests will decrease the amount of time that you wait before being seen by our physicians.       _____________________________________________________________  Should you have questions after your visit to [facility], please contact our office at [phone] and follow the prompts.  Our office hours are 8:00 a.m. and 4:30 p.m. Monday - Friday.  Please note that voicemails left after 4:00 p.m. may not be returned until the following business day.  We are closed weekends and major holidays.  You do have access to a nurse 24-7, just call the main number to the clinic [phone] and do not press any options, hold on the line and a nurse will answer the phone.    For prescription refill requests, have your pharmacy contact our office and allow 72 hours.    Due  to Covid, you will need to wear a mask upon entering the hospital. If you do not have a mask, a mask will be given to you at the Main Entrance upon arrival. For doctor visits, patients may have 1 support person age 56 or older with them. For treatment visits, patients can not have anyone with them due to social distancing guidelines and our immunocompromised population.

## 2021-12-15 NOTE — Progress Notes (Signed)
Shawna Huerta, [place] [address]   CLINIC:  Medical Oncology/Hematology  PCP:  Sandria Manly [facility] [address] / [place] [address]  [phone]  REASON FOR VISIT:  Follow-up for left leg DVT  PRIOR THERAPY: none  CURRENT THERAPY: under work-up  INTERVAL HISTORY:  Shawna Huerta, a 61 y.o. female, returns for routine follow-up for her left leg DVT. Shawna Huerta was last seen on 11/12/2021.  Today she reports feeling well. She has constant pins and needles sensation on her left shin; she reports this is the same area she has pain at the time of her DVT. This pain has worsened since her last visit and is worsened with deep breathing. She takes ibuprofen and tylenol for this pain. She denies CP.   REVIEW OF SYSTEMS:  Review of Systems  Constitutional:  Positive for fatigue. Negative for appetite change.  Cardiovascular:  Positive for leg swelling (L leg). Negative for chest pain.  Musculoskeletal:  Positive for myalgias (L leg).  All other systems reviewed and are negative.  PAST MEDICAL/SURGICAL HISTORY:  Past Medical History:  Diagnosis Date   Anemia    Arthritis    Bartholin cyst 07/03/2014   Colitis 08/2015   Treated at [facility]   Diabetes mellitus    Hypertension    Labial abscess 08/2015   Status post I&D by Dr. Ladona Horns   Nicotine addiction 09/03/2013   Obesity    Pancreatitis 09/2015   unknown etiology, no further work-up with gastroenterology    Vaginal itching 09/03/2013   Past Surgical History:  Procedure Laterality Date   ABDOMINAL HYSTERECTOMY     ABDOMINAL SURGERY     exploratry with TAH   BALLOON DILATION N/A 10/26/2016   Procedure: Pyloric channel BALLOON DILATION;  Surgeon: Daneil Dolin, MD;  [facility];  Service: Endoscopy;  Laterality: N/A;   BARTHOLIN GLAND CYST EXCISION Right 08/20/2014   Procedure: EXCISION OF RIGHT BARTHOLIN'S GLAND CYST;  Surgeon: Florian Buff, MD;  Location:  [facility];  Service: Gynecology;  Laterality: Right;   CHOLECYSTECTOMY     COLONOSCOPY  10/2015   Doristine Mango: mild diverticulosis, at least 8 polpys removed, multiple tubular adenomas. next tcs in 1 year   ESOPHAGOGASTRODUODENOSCOPY N/A 10/26/2016   Procedure: ESOPHAGOGASTRODUODENOSCOPY (EGD);  Surgeon: Daneil Dolin, MD;  Location: [facility];  Service: Endoscopy;  Laterality: N/A;   FOOT SURGERY     FOOT SURGERY Right 2011   shave bone   TONSILLECTOMY      SOCIAL HISTORY:  Social History   Socioeconomic History   Marital status: Legally Separated    Spouse name: Not on file   Number of children: Not on file   Years of education: Not on file   Highest education level: Not on file  Occupational History   Not on file  Tobacco Use   Smoking status: Former    Packs/day: 0.25    Years: 3.00    Pack years: 0.75    Types: Cigarettes    Quit date: 10/21/2016    Years since quitting: 5.1   Smokeless tobacco: Never  Substance and Sexual Activity   Alcohol use: No    Comment: occ.   Drug use: No   Sexual activity: Not Currently    Birth control/protection: Surgical  Other Topics Concern   Not on file  Social History Narrative   Not on file   Social Determinants of Health  Financial Resource Strain: Not on file  Food Insecurity: Not on file  Transportation Needs: Not on file  Physical Activity: Not on file  Stress: Not on file  Social Connections: Not on file  Intimate Partner Violence: Not on file    FAMILY HISTORY:  Family History  Problem Relation Age of Onset   Diabetes Mother    Stroke Mother    Cancer Father        liver and colon. Patient unsure primary    Stomach cancer Father    Diabetes Sister    Diabetes Brother    Lung cancer Brother     CURRENT MEDICATIONS:  Current Outpatient Medications  Medication Sig Dispense Refill   apixaban (ELIQUIS) 5 MG TABS tablet Take 2 tablets (10mg ) twice daily for 7 days, then 1 tablet (5mg ) twice daily  60 tablet 0   glyBURIDE-metformin (GLUCOVANCE) 5-500 MG per tablet Take 1 tablet by mouth 3 (three) times daily.      lisinopril (ZESTRIL) 10 MG tablet Take 1 tablet (10 mg total) by mouth daily.  1   No current facility-administered medications for this visit.    ALLERGIES:  Allergies  Allergen Reactions   Iohexol Swelling     Code: HIVES, Desc: PT STATES THE EYES SWOLL AND ITCHED, NEEDS PRE MEDS     PHYSICAL EXAM:  Performance status (ECOG): 0 - Asymptomatic  Vitals:   12/15/21 1033  BP: (!) 144/69  Pulse: 78  Resp: 18  Temp: (!) 97.3 F (36.3 C)  SpO2: 100%   Wt Readings from Last 3 Encounters:  12/15/21 256 lb 6.4 oz (116.3 kg)  11/12/21 257 lb 6.4 oz (116.8 kg)  10/31/21 260 lb (117.9 kg)   Physical Exam Vitals reviewed.  Constitutional:      Appearance: Normal appearance. She is obese.  Cardiovascular:     Rate and Rhythm: Normal rate and regular rhythm.     Pulses: Normal pulses.     Heart sounds: Normal heart sounds.  Pulmonary:     Effort: Pulmonary effort is normal.     Breath sounds: Normal breath sounds.  Musculoskeletal:     Right lower leg: Normal. No swelling.     Left lower leg: Swelling (slight) present.  Skin:    General: Skin is cool.     Comments: L leg no warmth  Neurological:     General: No focal deficit present.     Mental Status: She is alert and oriented to person, place, and time.  Psychiatric:        Mood and Affect: Mood normal.        Behavior: Behavior normal.    LABORATORY DATA:  I have reviewed the labs as listed.  CBC Latest Ref Rng & Units 10/31/2021 07/07/2019 07/06/2019  WBC 4.0 - 10.5 K/uL 7.4 5.7 6.1  Hemoglobin 12.0 - 15.0 g/dL 10.8(L) 9.7(L) 9.5(L)  Hematocrit 36.0 - 46.0 % 35.7(L) 32.9(L) 32.3(L)  Platelets 150 - 400 K/uL 233 184 202   CMP Latest Ref Rng & Units 10/31/2021 07/07/2019 07/06/2019  Glucose 70 - 99 mg/dL 94 116(H) 86  BUN 6 - 20 mg/dL 6 15 23(H)  Creatinine 0.44 - 1.00 mg/dL 0.60 1.08(H) 2.08(H)  Sodium  135 - 145 mmol/L 137 138 139  Potassium 3.5 - 5.1 mmol/L 3.8 4.1 3.8  Chloride 98 - 111 mmol/L 102 106 105  CO2 22 - 32 mmol/L 27 24 25   Calcium 8.9 - 10.3 mg/dL 8.7(L) 8.6(L) 8.5(L)  Total Protein 6.5 -  8.1 g/dL 7.8 - -  Total Bilirubin 0.3 - 1.2 mg/dL 0.3 - -  Alkaline Phos 38 - 126 U/L 58 - -  AST 15 - 41 U/L 13(L) - -  ALT 0 - 44 U/L 16 - -      Component Value Date/Time   RBC 4.94 11/12/2021 1346   RBC 4.94 10/31/2021 1405   MCV 72.3 (L) 10/31/2021 1405   MCH 21.9 (L) 10/31/2021 1405   MCHC 30.3 10/31/2021 1405   RDW 16.8 (H) 10/31/2021 1405   LYMPHSABS 2.4 10/31/2021 1405   MONOABS 0.6 10/31/2021 1405   EOSABS 0.1 10/31/2021 1405   BASOSABS 0.0 10/31/2021 1405    DIAGNOSTIC IMAGING:  I have independently reviewed the scans and discussed with the patient. US BREAST LTD UNI LEFT INC AXILLA  Result Date: 12/14/2021 CLINICAL DATA:  Screening recall for possible left breast masses. EXAM: DIGITAL DIAGNOSTIC UNILATERAL LEFT MAMMOGRAM WITH TOMOSYNTHESIS AND CAD; ULTRASOUND LEFT BREAST LIMITED TECHNIQUE: Left digital diagnostic mammography and breast tomosynthesis was performed. The images were evaluated with computer-aided detection.; Targeted ultrasound examination of the left breast was performed. COMPARISON:  Previous exams. ACR Breast Density Category b: There are scattered areas of fibroglandular density. FINDINGS: Additional tomograms were performed of the left breast. There is an oval circumscribed mass in the upper central left breast measuring 0.8 cm. There is an additional oval mass in the upper-outer left breast measuring 0.7 cm. Physical exam of the outer left breast reveals a dime-sized area of brownish discoloration with underlying thickening likely reflecting a small sebaceous cyst in the skin. Targeted ultrasound of the left breast was performed. There is an oval circumscribed hypoechoic mass with a small central echogenic component at 12 o'clock 10 cm from nipple measuring  0.6 x 0.3 x 0.5 cm. This may represent a small intramammary lymph node and corresponds well with the mass seen in the upper central left breast at mammography. There is a small ill-defined hypoechoic mass in the skin of the left breast at the 3 o'clock (incorrectly labeled as 9 o'clock) position 15 cm from nipple measuring 0.5 x 0.2 x 0.6 cm. Findings are consistent with a small sebaceous cyst and may correspond with the additional mass seen in the outer left breast at mammography. No lymphadenopathy seen in the left axilla. IMPRESSION: Probably benign left breast masses. RECOMMENDATION: Diagnostic mammography of the left breast with possible ultrasound in 6 months. I have discussed the findings and recommendations with the patient. If applicable, a reminder letter will be sent to the patient regarding the next appointment. BI-RADS CATEGORY  3: Probably benign. Electronically Signed   By: Everlean Alstrom M.D.   On: 12/14/2021 14:58  MM DIAG BREAST TOMO UNI LEFT  Result Date: 12/14/2021 CLINICAL DATA:  Screening recall for possible left breast masses. EXAM: DIGITAL DIAGNOSTIC UNILATERAL LEFT MAMMOGRAM WITH TOMOSYNTHESIS AND CAD; ULTRASOUND LEFT BREAST LIMITED TECHNIQUE: Left digital diagnostic mammography and breast tomosynthesis was performed. The images were evaluated with computer-aided detection.; Targeted ultrasound examination of the left breast was performed. COMPARISON:  Previous exams. ACR Breast Density Category b: There are scattered areas of fibroglandular density. FINDINGS: Additional tomograms were performed of the left breast. There is an oval circumscribed mass in the upper central left breast measuring 0.8 cm. There is an additional oval mass in the upper-outer left breast measuring 0.7 cm. Physical exam of the outer left breast reveals a dime-sized area of brownish discoloration with underlying thickening likely reflecting a small sebaceous cyst in the  skin. Targeted ultrasound of the left  breast was performed. There is an oval circumscribed hypoechoic mass with a small central echogenic component at 12 o'clock 10 cm from nipple measuring 0.6 x 0.3 x 0.5 cm. This may represent a small intramammary lymph node and corresponds well with the mass seen in the upper central left breast at mammography. There is a small ill-defined hypoechoic mass in the skin of the left breast at the 3 o'clock (incorrectly labeled as 9 o'clock) position 15 cm from nipple measuring 0.5 x 0.2 x 0.6 cm. Findings are consistent with a small sebaceous cyst and may correspond with the additional mass seen in the outer left breast at mammography. No lymphadenopathy seen in the left axilla. IMPRESSION: Probably benign left breast masses. RECOMMENDATION: Diagnostic mammography of the left breast with possible ultrasound in 6 months. I have discussed the findings and recommendations with the patient. If applicable, a reminder letter will be sent to the patient regarding the next appointment. BI-RADS CATEGORY  3: Probably benign. Electronically Signed   By: Everlean Alstrom M.D.   On: 12/14/2021 14:58  MM 3D SCREEN BREAST BILATERAL  Result Date: 11/18/2021 CLINICAL DATA:  Screening. EXAM: DIGITAL SCREENING BILATERAL MAMMOGRAM WITH TOMOSYNTHESIS AND CAD TECHNIQUE: Bilateral screening digital craniocaudal and mediolateral oblique mammograms were obtained. Bilateral screening digital breast tomosynthesis was performed. The images were evaluated with computer-aided detection. COMPARISON:  Previous exam(s). ACR Breast Density Category b: There are scattered areas of fibroglandular density. FINDINGS: In the left breast, possible masses warrant further evaluation. The 2 possible masses are seen within the outer LEFT breast, at posterior depth cc slices 26 and 38, MLO slice 62. In the right breast, no findings suspicious for malignancy. IMPRESSION: Further evaluation is suggested for possible masses in the left breast. RECOMMENDATION:  Diagnostic mammogram and possibly ultrasound of the left breast. (Code:[id number]) The patient will be contacted regarding the findings, and additional imaging will be scheduled. BI-RADS CATEGORY  0: Incomplete. Need additional imaging evaluation and/or prior mammograms for comparison. Electronically Signed   By: Franki Cabot M.D.   On: 11/18/2021 08:25     ASSESSMENT:  Unprovoked left leg DVT: - She noticed left leg swelling 4 to 5 days prior to ER visit, was seen in urgent care. - Left leg ultrasound on 11/01/2021 with DVT noted in the left femoral, popliteal, posterior tibial and peroneal veins. - She is currently tolerating Eliquis very well. - She does not have any B symptoms.  She had 1 miscarriage, at 85-month gestation. - Colonoscopy in 2016 with mild diverticulosis and multiple subcentimeter polyps removed.  EGD in 2017 showing esophagitis. - Last mammogram on 10/03/2011, normal.    Social/family history: - She lives with her family at home.  She has been on disability since April 2022 secondary to right leg problems.  She is able to do all her day-to-day activities and is active.  She worked in a [employer], [employer].  She quit smoking 1 year ago.  She smoked half pack per day for 6 years. - Father and brother died of lung cancer, both were smokers.  No family history of thrombosis.   PLAN:  Unprovoked left leg DVT: - She has not missed any doses of Eliquis. - She reported left lower shin feeling constant like "something crawling under her skin" sensation.  She reports pins-and-needles sensation when she takes deep breath in the same area. - Examination did not reveal any sign of infection or significant swelling.  There is mild discoloration  with hyperpigmentation over the left shin. - We have repeated Doppler today.  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  Interval resolution of the femoral vein DVT. - I do  not believe that she is resistant to Eliquis.  Continue Eliquis at this time. - RTC 4 months with repeat labs and D-dimer.  2.  Microcytic anemia: - Work-up for macrocytosis and slight anemia showed ferritin 034, folic acid, K35, copper levels normal.  LDH was normal.  SPEP was negative.  Reticulocyte count was 1%.  No indication for further work-up.  3.  Health maintenance: - Reviewed mammogram results from 12/14/2021, BI-RADS 3 on the left breast diagnostic mammogram.  54-month left breast mammogram will be scheduled.  Orders placed this encounter:  Orders Placed This Encounter  Procedures   US Venous Img Lower Unilateral Left     Derek Jack, MD [place] [phone]   I, Thana Ates, am acting as a scribe for Dr. Derek Jack.  I, Derek Jack MD, have reviewed the above documentation for accuracy and completeness, and I agree with the above.

## 2021-12-17 ENCOUNTER — Encounter (HOSPITAL_COMMUNITY): Payer: Self-pay

## 2021-12-17 ENCOUNTER — Ambulatory Visit (HOSPITAL_COMMUNITY): Payer: 59

## 2022-02-17 ENCOUNTER — Encounter: Payer: Self-pay | Admitting: Nurse Practitioner

## 2022-02-17 ENCOUNTER — Other Ambulatory Visit: Payer: Self-pay

## 2022-02-17 ENCOUNTER — Ambulatory Visit (INDEPENDENT_AMBULATORY_CARE_PROVIDER_SITE_OTHER): Payer: 59 | Admitting: Nurse Practitioner

## 2022-02-17 VITALS — BP 190/98 | HR 92 | Ht 67.0 in | Wt 256.0 lb

## 2022-02-17 DIAGNOSIS — D509 Iron deficiency anemia, unspecified: Secondary | ICD-10-CM

## 2022-02-17 DIAGNOSIS — Z23 Encounter for immunization: Secondary | ICD-10-CM | POA: Diagnosis not present

## 2022-02-17 DIAGNOSIS — I10 Essential (primary) hypertension: Secondary | ICD-10-CM

## 2022-02-17 DIAGNOSIS — E119 Type 2 diabetes mellitus without complications: Secondary | ICD-10-CM | POA: Diagnosis not present

## 2022-02-17 DIAGNOSIS — I82402 Acute embolism and thrombosis of unspecified deep veins of left lower extremity: Secondary | ICD-10-CM

## 2022-02-17 DIAGNOSIS — N6320 Unspecified lump in the left breast, unspecified quadrant: Secondary | ICD-10-CM

## 2022-02-17 DIAGNOSIS — F1290 Cannabis use, unspecified, uncomplicated: Secondary | ICD-10-CM | POA: Insufficient documentation

## 2022-02-17 MED ORDER — LISINOPRIL 20 MG PO TABS: 20.0000 mg | ORAL_TABLET | Freq: Every day | ORAL | 3 refills | Status: DC

## 2022-02-17 MED ORDER — GLYBURIDE-METFORMIN 5-500 MG PO TABS: 1.0000 | ORAL_TABLET | Freq: Two times a day (BID) | ORAL | 3 refills | Status: DC

## 2022-02-17 NOTE — Progress Notes (Signed)
? ?New Patient Office Visit ? ?Subjective:  ?Patient ID: Shawna Huerta, female    DOB: 11-06-1961  Age: 61 y.o. MRN: [id number] ? ?CC:  ?Chief Complaint  ?Patient presents with  ? New Patient (Initial Visit)  ?  NP  ? Rash  ?  On both legs had it since around 4 months around 11/22  ? ? ?HPI ?Shawna Huerta with past medical history of essential hypertension, left leg DVT, acute pancreatitis, type 2 diabetes presents to establish care for her chronic medical conditions. Previous PCP at the [facility], last visit was over a year, went to urgent care about 4 months ago. ? ?she has been out of lisinopril 10 mg and glucovance 5-500 mg tablet since about 2 months ago. Pt denies dizziness, CP, palpitations ,edema, polyphagia, polyuria.  ? ?Microcytic anemia, left leg DVT .taking eliquis 8m BID for history of DVT Nov 2022, pt denies unusual bleeding, bloody stool.Followed by hematology , next appointment is in M2023/05/07? ? ?Her son died 4 years ago, died due to heart disease, now caring for her sister who has stomach cancer, things are tough sometimes, started using marijuana a month ago. Pt denies SI, HI.  ? ?Has had total hysterectomy for heavy bleeding in 2000.  ? ?Due for flu vaccines, last covid vaccine was in Dec., 2022 ? ?Mammogram results from 12/14/2021 reviewed,  a mass found in the upper central left breast ,diagnostic mammogram of left breast with possible ultrasound recommended in 6 months. ? ? ?Past Medical History:  ?Diagnosis Date  ? Anemia   ? Arthritis   ? Bartholin cyst 07/03/2014  ? Colitis 08/2015  ? [facility] ? Diabetes mellitus   ? Hypertension   ? Labial abscess 08/2015  ? Status post I&D by Dr. CLadona Horns ? Nicotine addiction 09/03/2013  ? Obesity   ? Pancreatitis 09/2015  ? unknown etiology, no further work-up with gastroenterology   ? Vaginal itching 09/03/2013  ? ? ?Past Surgical History:  ?Procedure Laterality Date  ? ABDOMINAL HYSTERECTOMY    ? ABDOMINAL SURGERY    ?  exploratry with TAH  ? BALLOON DILATION N/A 10/26/2016  ? Procedure: Pyloric channel BALLOON DILATION;  Surgeon: RDaneil Dolin MD;  [facility];  Service: Endoscopy;  Laterality: N/A;  ? BARTHOLIN GLAND CYST EXCISION Right 08/20/2014  ? Procedure: EXCISION OF RIGHT BARTHOLIN'S GLAND CYST;  Surgeon: LFlorian Buff MD;  Location: [facility];  Service: Gynecology;  Laterality: Right;  ? CHOLECYSTECTOMY    ? COLONOSCOPY  10/2015  ? CDoristine Mango mild diverticulosis, at least 8 polpys removed, multiple tubular adenomas. next tcs in 1 year  ? ESOPHAGOGASTRODUODENOSCOPY N/A 10/26/2016  ? Procedure: ESOPHAGOGASTRODUODENOSCOPY (EGD);  Surgeon: RDaneil Dolin MD;  [facility];  Service: Endoscopy;  Laterality: N/A;  ? FOOT SURGERY    ? FOOT SURGERY Right 2011  ? shave bone  ? TONSILLECTOMY    ? ? ?Family History  ?Problem Relation Age of Onset  ? Diabetes Mother   ? Stroke Mother   ? Cancer Father   ?     liver and colon. Patient unsure primary   ? Stomach cancer Father   ? Diabetes Sister   ? Stomach cancer Sister   ? Liver cancer Sister   ? Bone cancer Sister   ? Diabetes Brother   ? Lung cancer Brother   ? Heart disease Son   ? Breast cancer Neg Hx   ? ? ?  Social History  ? ?Socioeconomic History  ? Marital status: Legally Separated  ?  Spouse name: Not on file  ? Number of children: 1  ? Years of education: Not on file  ? Highest education level: Not on file  ?Occupational History  ? Not on file  ?Tobacco Use  ? Smoking status: Former  ?  Packs/day: 0.25  ?  Years: 3.00  ?  Pack years: 0.75  ?  Types: Cigarettes  ?  Quit date: 10/21/2016  ?  Years since quitting: 5.3  ? Smokeless tobacco: Never  ?Substance and Sexual Activity  ? Alcohol use: No  ?  Comment: occ.  ? Drug use: Yes  ?  Types: Marijuana  ?  Comment: started using marijuana a month on the weekends to cope with depression.  ? Sexual activity: Not Currently  ?  Birth control/protection: Surgical  ?Other Topics Concern  ? Not on file   ?Social History Narrative  ? Had one child now deceased  ? Lives with her sister, sister recently diagnosed with cancer.   ? ?Social Determinants of Health  ? ?Financial Resource Strain: Not on file  ?Food Insecurity: Not on file  ?Transportation Needs: Not on file  ?Physical Activity: Not on file  ?Stress: Not on file  ?Social Connections: Not on file  ?Intimate Partner Violence: Not on file  ? ? ?ROS ?Review of Systems  ?Constitutional: Negative.   ?Respiratory: Negative.    ?Cardiovascular: Negative.   ?Gastrointestinal: Negative.   ?Musculoskeletal: Negative.   ?Neurological: Negative.   ?Psychiatric/Behavioral: Negative.    ? ?Objective:  ? ?Today's Vitals: BP (!) 190/98 (BP Location: Left Arm, Cuff Size: Large)   Pulse 92   Ht '5\' 7"'  (1.702 m)   Wt 256 lb (116.1 kg)   SpO2 99%   BMI 40.10 kg/m?  ? ?Physical Exam ?Constitutional:   ?   General: She is not in acute distress. ?   Appearance: Normal appearance. She is obese. She is not ill-appearing, toxic-appearing or diaphoretic.  ?Cardiovascular:  ?   Rate and Rhythm: Normal rate and regular rhythm.  ?   Pulses: Normal pulses.  ?   Heart sounds: No murmur heard. ?  No friction rub. No gallop.  ?Pulmonary:  ?   Effort: Pulmonary effort is normal. No respiratory distress.  ?   Breath sounds: Normal breath sounds. No stridor. No wheezing, rhonchi or rales.  ?Chest:  ?   Chest wall: No tenderness.  ?Musculoskeletal:     ?   General: No swelling, tenderness, deformity or signs of injury.  ?   Right lower leg: No edema.  ?   Left lower leg: No edema.  ?Neurological:  ?   Mental Status: She is alert and oriented to person, place, and time.  ?Psychiatric:     ?   Mood and Affect: Mood normal.     ?   Behavior: Behavior normal.     ?   Thought Content: Thought content normal.     ?   Judgment: Judgment normal.  ? ? ?Assessment & Plan:  ? ?Problem List Items Addressed This Visit   ? ?  ? Cardiovascular and Mediastinum  ? Essential hypertension  ?  BP Readings from  Last 3 Encounters:  ?02/17/22 (!) 190/98  ?12/15/21 (!) 144/69  ?11/12/21 (!) 186/84  ?Has been out of lisinopril 10 mg daily about 2 months ago ?She has not been checking blood pressure at home. ?Start lisinopril 20 mg daily ?  DASH diet advised need to engage in regular exercise discussed with patient. ?CMP today, BMP in 2 weeks ?Monitor blood pressure at home keep a log and bring to next visit. ?Follow-up in 4 weeks ?  ?  ? Relevant Medications  ? lisinopril (ZESTRIL) 20 MG tablet  ? Other Relevant Orders  ? Basic Metabolic Panel (BMET)  ? CMP14+EGFR  ? Left leg DVT ([place])  ?  Takes Eliquis 5 mg twice daily ?Followed by hematology ?Denies bleeding. ?  ?  ? Relevant Medications  ? lisinopril (ZESTRIL) 20 MG tablet  ?  ? Endocrine  ? Diabetes ([place]) - Primary  ?  Was taking Glucovance 5-500 mg 1 tablet twice daily ?Ran out of medication 2 months ago ?Medication refill today ?Patient told to take 1 tablet by mouth twice daily ?Avoid sugar, cookie, soda ?Check A1c today ?Schedule diabetic eye exam today ?Not on statin check lipid panel today ?Will do  foot exam at next visit ?  ?  ? Relevant Medications  ? lisinopril (ZESTRIL) 20 MG tablet  ? glyBURIDE-metformin (GLUCOVANCE) 5-500 MG tablet  ? Other Relevant Orders  ? HgB A1c  ? Lipid Profile  ?  ? Other  ? Microcytic anemia  ?  Lab Results  ?Component Value Date  ? WBC 7.4 10/31/2021  ? HGB 10.8 (L) 10/31/2021  ? HCT 35.7 (L) 10/31/2021  ? MCV 72.3 (L) 10/31/2021  ? PLT 233 10/31/2021  ?followed by hematology, not on iron supplements Labs done by hematology were normal at last visit, pt encouraged to maintain close follow up with hematology , she verbalized understanding  ?  ?  ? Need for immunization against influenza  ?  Patient educated on CDC recommendation for the vaccine. Verbal consent was obtained from the patient, vaccine administered by nurse, no sign of adverse reactions noted at this time. Patient education on arm soreness and use of tylenol   was discussed.  Patient educated on the signs and symptoms of adverse effect and advise to contact the office if they occur. ?  ?  ? Relevant Orders  ? Flu Vaccine QUAD 58moIM (Fluarix, Fluzone & Alfiuria Quad PF) (Completed)  ? L

## 2022-02-17 NOTE — Assessment & Plan Note (Signed)
Lab Results  ?Component Value Date  ? WBC 7.4 10/31/2021  ? HGB 10.8 (L) 10/31/2021  ? HCT 35.7 (L) 10/31/2021  ? MCV 72.3 (L) 10/31/2021  ? PLT 233 10/31/2021  ?followed by hematology, not on iron supplements Labs done by hematology were normal at last visit, pt encouraged to maintain close follow up with hematology , she verbalized understanding  ?

## 2022-02-17 NOTE — Assessment & Plan Note (Signed)
BP Readings from Last 3 Encounters:  ?02/17/22 (!) 190/98  ?12/15/21 (!) 144/69  ?11/12/21 (!) 186/84  ?Has been out of lisinopril 10 mg daily about 2 months ago ?She has not been checking blood pressure at home. ?Start lisinopril 20 mg daily ?DASH diet advised need to engage in regular exercise discussed with patient. ?CMP today, BMP in 2 weeks ?Monitor blood pressure at home keep a log and bring to next visit. ?Follow-up in 4 weeks ?

## 2022-02-17 NOTE — Assessment & Plan Note (Signed)
Patient educated on CDC recommendation for the vaccine. Verbal consent was obtained from the patient, vaccine administered by nurse, no sign of adverse reactions noted at this time. Patient education on arm soreness and use of tylenol   was discussed. Patient educated on the signs and symptoms of adverse effect and advise to contact the office if they occur. ?

## 2022-02-17 NOTE — Assessment & Plan Note (Signed)
Her son died 4 years ago, died due to heart disease, now caring for her sister who has stomach cancer, things are tough sometimes, started using marijuana a month ago. Pt denies SI, HI.  ?Patient denies depression today, states that she will quit marijuana, patient encouraged to quit taking marijuana, need to quit including risk of respiratory diseases discussed today. I told pt that we can  start her on medications for depression if she ever becomes depressed she verbalized understanding.  ?

## 2022-02-17 NOTE — Assessment & Plan Note (Signed)
Was taking Glucovance 5-500 mg 1 tablet twice daily ?Ran out of medication 2 months ago ?Medication refill today ?Patient told to take 1 tablet by mouth twice daily ?Avoid sugar, cookie, soda ?Check A1c today ?Schedule diabetic eye exam today ?Not on statin check lipid panel today ?Will do  foot exam at next visit ?

## 2022-02-17 NOTE — Assessment & Plan Note (Signed)
Mammogram results from 12/14/2021 reviewed,  a mass found in the upper central left breast ,diagnostic mammogram of left breast with possible ultrasound recommended in 6 months. ?

## 2022-02-17 NOTE — Patient Instructions (Addendum)
Please schedule patient for diabetic eye exam, flu vaccine today ?Please get your TDAP vaccine and shingles vaccine at your pharmacy ?Please take lisinopril 20 mg daily.  ?Please get your labs done in 2 weeks as discussed  ? ? ?It is important that you exercise regularly at least 30 minutes 5 times a week.  ?Think about what you will eat, plan ahead. ?Choose " clean, green, fresh or frozen" over canned, processed or packaged foods which are more sugary, salty and fatty. ?70 to 75% of food eaten should be vegetables and fruit. ?Three meals at set times with snacks allowed between meals, but they must be fruit or vegetables. ?Aim to eat over a 12 hour period , example 7 am to 7 pm, and STOP after  your last meal of the day. ?Drink water,generally about 64 ounces per day, no other drink is as healthy. Fruit juice is best enjoyed in a healthy way, by EATING the fruit. ? ?Thanks for choosing [facility], we consider it a privelige to serve you. ? ?

## 2022-02-17 NOTE — Assessment & Plan Note (Signed)
Takes Eliquis 5 mg twice daily ?Followed by hematology ?Denies bleeding. ?

## 2022-02-18 ENCOUNTER — Other Ambulatory Visit: Payer: Self-pay | Admitting: Nurse Practitioner

## 2022-02-18 ENCOUNTER — Telehealth: Payer: Self-pay

## 2022-02-18 DIAGNOSIS — E785 Hyperlipidemia, unspecified: Secondary | ICD-10-CM

## 2022-02-18 LAB — CMP14+EGFR
ALT: 12 IU/L (ref 0–32)
AST: 13 IU/L (ref 0–40)
Albumin/Globulin Ratio: 1.1 — ABNORMAL LOW (ref 1.2–2.2)
Albumin: 4.2 g/dL (ref 3.8–4.9)
Alkaline Phosphatase: 98 IU/L (ref 44–121)
BUN/Creatinine Ratio: 8 — ABNORMAL LOW (ref 9–23)
BUN: 5 mg/dL — ABNORMAL LOW (ref 6–24)
Bilirubin Total: 0.3 mg/dL (ref 0.0–1.2)
CO2: 24 mmol/L (ref 20–29)
Calcium: 9.7 mg/dL (ref 8.7–10.2)
Chloride: 99 mmol/L (ref 96–106)
Creatinine, Ser: 0.66 mg/dL (ref 0.57–1.00)
Globulin, Total: 3.8 g/dL (ref 1.5–4.5)
Glucose: 320 mg/dL — ABNORMAL HIGH (ref 70–99)
Potassium: 4.6 mmol/L (ref 3.5–5.2)
Sodium: 136 mmol/L (ref 134–144)
Total Protein: 8 g/dL (ref 6.0–8.5)
eGFR: 102 mL/min/{1.73_m2} (ref >59)

## 2022-02-18 LAB — LIPID PANEL
Chol/HDL Ratio: 3.3 ratio (ref 0.0–4.4)
Cholesterol, Total: 153 mg/dL (ref 100–199)
HDL: 46 mg/dL (ref >39)
LDL Chol Calc (NIH): 88 mg/dL (ref 0–99)
Triglycerides: 103 mg/dL (ref 0–149)
VLDL Cholesterol Cal: 19 mg/dL (ref 5–40)

## 2022-02-18 LAB — HEMOGLOBIN A1C
Est. average glucose Bld gHb Est-mCnc: 312 mg/dL
Hgb A1c MFr Bld: 12.5 % — ABNORMAL HIGH (ref 4.8–5.6)

## 2022-02-18 MED ORDER — ATORVASTATIN CALCIUM 10 MG PO TABS: 10.0000 mg | ORAL_TABLET | Freq: Every day | ORAL | 3 refills | Status: DC

## 2022-02-18 NOTE — Telephone Encounter (Signed)
Called pt no answer left vm to return call. I have called the pharmacy, they have received the prescriptions but they have not filled them yet due to needing her insurance card.  ?

## 2022-02-18 NOTE — Progress Notes (Signed)
Uncontrolled diabetes A1c 12.5, patient should start taking Glucovance 1 tablet by mouth 2 times daily as ordered, avoid sugar sweets soda.  We will check A1c in 3 months and add further medication if needed to achieve A1c goal of less than 7.  ? ?LDL not at goal of less than 70, patient should start taking atorvastatin 10 mg daily, avoid fatty fried foods avoid alcohol while taking this medication. ? ? ?The 10-year ASCVD risk score (Arnett DK, et al., 2019) is: 37% ?  Values used to calculate the score: ?    Age: 61 years ?    Sex: Female ?    Is Non-Hispanic African American: Yes ?    Diabetic: Yes ?    Tobacco smoker: No ?    Systolic Blood Pressure: 875 mmHg ?    Is BP treated: Yes ?    HDL Cholesterol: 46 mg/dL ?    Total Cholesterol: 153 mg/dL  ?

## 2022-02-18 NOTE — Telephone Encounter (Signed)
Patient called said did not received at the pharmacy ask please resend  ? ?lisinopril (ZESTRIL) 20 MG tablet ? ?glyBURIDE-metformin (GLUCOVANCE) 5-500 MG tablet  ? ? ?[facility] [address] ?

## 2022-02-22 ENCOUNTER — Other Ambulatory Visit: Payer: Self-pay

## 2022-02-22 ENCOUNTER — Ambulatory Visit: Payer: 59

## 2022-02-22 LAB — HM DIABETES EYE EXAM

## 2022-03-04 LAB — BASIC METABOLIC PANEL
BUN/Creatinine Ratio: 9 (ref 9–23)
BUN: 7 mg/dL (ref 6–24)
CO2: 20 mmol/L (ref 20–29)
Calcium: 9.5 mg/dL (ref 8.7–10.2)
Chloride: 107 mmol/L — ABNORMAL HIGH (ref 96–106)
Creatinine, Ser: 0.8 mg/dL (ref 0.57–1.00)
Glucose: 120 mg/dL — ABNORMAL HIGH (ref 70–99)
Potassium: 4.2 mmol/L (ref 3.5–5.2)
Sodium: 141 mmol/L (ref 134–144)
eGFR: 85 mL/min/{1.73_m2} (ref >59)

## 2022-03-07 ENCOUNTER — Other Ambulatory Visit: Payer: Self-pay | Admitting: Nurse Practitioner

## 2022-03-07 DIAGNOSIS — E785 Hyperlipidemia, unspecified: Secondary | ICD-10-CM

## 2022-03-07 NOTE — Progress Notes (Signed)
Electrolytes are stable. Take all medications as ordered. Continue to monitor BP at home, Pt should follow up as dicussed to recheck her lipid panel and BP. Please have fasting labs done 3-5 days before her next visit. Thanks

## 2022-04-07 ENCOUNTER — Encounter: Payer: Self-pay | Admitting: Nurse Practitioner

## 2022-04-07 ENCOUNTER — Ambulatory Visit (INDEPENDENT_AMBULATORY_CARE_PROVIDER_SITE_OTHER): Payer: 59 | Admitting: Nurse Practitioner

## 2022-04-07 VITALS — BP 132/80 | HR 75 | Ht 67.0 in | Wt 250.0 lb

## 2022-04-07 DIAGNOSIS — I10 Essential (primary) hypertension: Secondary | ICD-10-CM | POA: Diagnosis not present

## 2022-04-07 DIAGNOSIS — Z6839 Body mass index (BMI) 39.0-39.9, adult: Secondary | ICD-10-CM

## 2022-04-07 DIAGNOSIS — E1165 Type 2 diabetes mellitus with hyperglycemia: Secondary | ICD-10-CM

## 2022-04-07 DIAGNOSIS — E119 Type 2 diabetes mellitus without complications: Secondary | ICD-10-CM | POA: Diagnosis not present

## 2022-04-07 DIAGNOSIS — E785 Hyperlipidemia, unspecified: Secondary | ICD-10-CM

## 2022-04-07 NOTE — Assessment & Plan Note (Addendum)
Lab Results  ?Component Value Date  ? HGBA1C 12.5 (H) 02/17/2022  ?On  glucovacnce 5-'500mg'$  , 1 tablet daily ?Home CBG readings  AM 170 when she restarted med, now 79-129, had 1 hypoglycemic episode. ?Continue current medication, avoid sugar sweets soda ?We will recheck A1c in 2 months ?Foot exam completed today. ?Urine creatinine labs ordered ? ?

## 2022-04-07 NOTE — Patient Instructions (Addendum)
Please get your TDAP , shingles vaccine at your pharmacy  ? ? ?It is important that you exercise regularly at least 30 minutes 5 times a week.  ?Think about what you will eat, plan ahead. ?Choose " clean, green, fresh or frozen" over canned, processed or packaged foods which are more sugary, salty and fatty. ?70 to 75% of food eaten should be vegetables and fruit. ?Three meals at set times with snacks allowed between meals, but they must be fruit or vegetables. ?Aim to eat over a 12 hour period , example 7 am to 7 pm, and STOP after  your last meal of the day. ?Drink water,generally about 64 ounces per day, no other drink is as healthy. Fruit juice is best enjoyed in a healthy way, by EATING the fruit. ? ?Thanks for choosing [facility], we consider it a privelige to serve you.  ? ?

## 2022-04-07 NOTE — Assessment & Plan Note (Addendum)
Wt Readings from Last 3 Encounters:  ?04/07/22 250 lb (113.4 kg)  ?02/17/22 256 lb (116.1 kg)  ?12/15/21 256 lb 6.4 oz (116.3 kg)  ?She has lost 6 pounds since last visit does walking excecrcise daily about 30 minutes a day. Eats mostly fruits and salad.  ?Need to increase intake of whole food consisting mainly vegetables and protein less carbohydrates drinking at least 64 ounces of water daily, portion control discussed with patient today.  Patient encouraged to continue her walking exercises.  Will plan to start patient on an SGLT2 or GLP-1 at her next visit after checking her A1c ?

## 2022-04-07 NOTE — Assessment & Plan Note (Signed)
BP Readings from Last 3 Encounters:  ?04/07/22 132/80  ?02/17/22 (!) 190/98  ?12/15/21 (!) 144/69  ?Chronic condition well-controlled on lisinopril 20 mg daily ?Continue current medication ?DASH diet advised engage in daily exercises at least 150 minutes weekly ?Follow-up in 2 months ?

## 2022-04-07 NOTE — Progress Notes (Addendum)
? ?  Shawna Huerta     MRN: [id number]      DOB: [birth year] ? ? ?HPI ?[name] with past medical history of type 2 diabetes, hyperlipidemia, obesity,, DVT, hypertension is here for follow up for hyperlipidemia. ? ?Patient has been taking atorvastatin 10 mg daily patient denies adverse reactions to medication, muscle aches ? ?Due for TDAP , shingles vaccine patient encouraged to get both vaccines at her pharmacy she verbalized understanding. ? ? ?ROS ?Denies recent fever or chills. ?Denies sinus pressure, nasal congestion, ear pain or sore throat. ?Denies chest congestion, productive cough or wheezing. ?Denies chest pains, palpitations and leg swelling ?Denies abdominal pain, nausea, vomiting,diarrhea or constipation.   ?Denies dysuria, frequency, hesitancy or incontinence. ?Denies joint pain, swelling and limitation in mobility. ?Denies depression, anxiety or insomnia. ? ? ? ?PE ? ?BP 132/80   Pulse 75   Ht '5\' 7"'$  (1.702 m)   Wt 250 lb (113.4 kg)   SpO2 97%   BMI 39.16 kg/m?  ? ?Patient alert and oriented and in no cardiopulmonary distress. ? ? ?Chest: Clear to auscultation bilaterally. ? ?CVS: S1, S2 no murmurs, no S3.Regular rate. ? ?ABD: Soft non tender.  ? ?Ext: No edema ? ?MS: Adequate ROM spine, shoulders, hips and knees. ? ?Psych: Good eye contact, normal affect. Memory intact not anxious or depressed appearing. ? ? ?Assessment & Plan ? ?Uncontrolled type 2 diabetes mellitus with hyperglycemia, without long-term current use of insulin ([place]) ?Lab Results  ?Component Value Date  ? HGBA1C 12.5 (H) 02/17/2022  ?On  glucovacnce 5-'500mg'$  , 1 tablet daily ?Home CBG readings  AM 170 when she restarted med, now 79-129, had 1 hypoglycemic episode. ?Continue current medication, avoid sugar sweets soda ?We will recheck A1c in 2 months ?Foot exam completed today. ?Urine creatinine labs ordered ? ? ?Obesity ?Wt Readings from Last 3 Encounters:  ?04/07/22 250 lb (113.4 kg)  ?02/17/22 256 lb (116.1 kg)  ?12/15/21 256 lb 6.4 oz  (116.3 kg)  ?She has lost 6 pounds since last visit does walking excecrcise daily about 30 minutes a day. Eats mostly fruits and salad.  ?Need to increase intake of whole food consisting mainly vegetables and protein less carbohydrates drinking at least 64 ounces of water daily, portion control discussed with patient today.  Patient encouraged to continue her walking exercises.  Will plan to start patient on an SGLT2 or GLP-1 at her next visit after checking her A1c ? ?Essential hypertension ?BP Readings from Last 3 Encounters:  ?04/07/22 132/80  ?02/17/22 (!) 190/98  ?12/15/21 (!) 144/69  ?Chronic condition well-controlled on lisinopril 20 mg daily ?Continue current medication ?DASH diet advised engage in daily exercises at least 150 minutes weekly ?Follow-up in 2 months ? ?Hyperlipidemia LDL goal <70 ?Started on atorvastatin daily for hyperlipidemia at her last visit ?Checking lipid panel today  ?

## 2022-04-09 DIAGNOSIS — E785 Hyperlipidemia, unspecified: Secondary | ICD-10-CM | POA: Insufficient documentation

## 2022-04-09 HISTORY — DX: Hyperlipidemia, unspecified: E78.5

## 2022-04-09 LAB — SPECIMEN STATUS REPORT

## 2022-04-09 NOTE — Progress Notes (Signed)
LDL well controlled continue current dose of atorvastatin '10mg'$  daily.

## 2022-04-09 NOTE — Assessment & Plan Note (Signed)
Started on atorvastatin daily for hyperlipidemia at her last visit ?Checking lipid panel today ?

## 2022-04-12 ENCOUNTER — Encounter: Payer: Self-pay | Admitting: Nurse Practitioner

## 2022-04-12 ENCOUNTER — Other Ambulatory Visit: Payer: Self-pay

## 2022-04-12 LAB — LIPID PANEL
Chol/HDL Ratio: 2.5 ratio (ref 0.0–4.4)
Cholesterol, Total: 98 mg/dL — ABNORMAL LOW (ref 100–199)
HDL: 40 mg/dL (ref >39)
LDL Chol Calc (NIH): 41 mg/dL (ref 0–99)
Triglycerides: 84 mg/dL (ref 0–149)
VLDL Cholesterol Cal: 17 mg/dL (ref 5–40)

## 2022-04-12 LAB — MICROALBUMIN / CREATININE URINE RATIO

## 2022-04-12 MED ORDER — UNABLE TO FIND: 2 refills | Status: DC

## 2022-04-12 MED ORDER — UNABLE TO FIND: 0 refills | Status: DC

## 2022-04-12 NOTE — Telephone Encounter (Signed)
Spoke with pt rx being sent ?

## 2022-04-13 ENCOUNTER — Other Ambulatory Visit: Payer: Self-pay

## 2022-04-13 MED ORDER — ONETOUCH ULTRASOFT LANCETS MISC: 12 refills | Status: AC

## 2022-04-26 ENCOUNTER — Other Ambulatory Visit (HOSPITAL_COMMUNITY): Payer: Self-pay | Admitting: *Deleted

## 2022-04-26 DIAGNOSIS — D500 Iron deficiency anemia secondary to blood loss (chronic): Secondary | ICD-10-CM

## 2022-04-26 DIAGNOSIS — E871 Hypo-osmolality and hyponatremia: Secondary | ICD-10-CM

## 2022-04-26 NOTE — Progress Notes (Shared)
[place], [place] [address]   CLINIC:  Medical Oncology/Hematology  PCP:  Renee Rival, FNP [address] [place] / [place] [address]  [phone]  REASON FOR VISIT:  Follow-up for left leg DVT  PRIOR THERAPY: none  CURRENT THERAPY: surveillance  INTERVAL HISTORY:  Ms. AEVA POSEY, a 61 y.o. female, returns for routine follow-up for her left leg DVT. Vastie was last seen on 12/15/2021.  ***  REVIEW OF SYSTEMS:  Review of Systems  All other systems reviewed and are negative.  PAST MEDICAL/SURGICAL HISTORY:  Past Medical History:  Diagnosis Date   Anemia    Arthritis    Bartholin cyst 07/03/2014   Colitis 08/2015   Treated at [facility]   Diabetes mellitus    Hypertension    Labial abscess 08/2015   Status post I&D by Dr. Ladona Horns   Nicotine addiction 09/03/2013   Obesity    Pancreatitis 09/2015   unknown etiology, no further work-up with gastroenterology    Vaginal itching 09/03/2013   Past Surgical History:  Procedure Laterality Date   ABDOMINAL HYSTERECTOMY     ABDOMINAL SURGERY     exploratry with TAH   BALLOON DILATION N/A 10/26/2016   Procedure: Pyloric channel BALLOON DILATION;  Surgeon: Daneil Dolin, MD;  Location: [facility];  Service: Endoscopy;  Laterality: N/A;   BARTHOLIN GLAND CYST EXCISION Right 08/20/2014   Procedure: EXCISION OF RIGHT BARTHOLIN'S GLAND CYST;  Surgeon: Florian Buff, MD;  Location: [facility];  Service: Gynecology;  Laterality: Right;   CHOLECYSTECTOMY     COLONOSCOPY  10/2015   Doristine Mango: mild diverticulosis, at least 8 polpys removed, multiple tubular adenomas. next tcs in 1 year   ESOPHAGOGASTRODUODENOSCOPY N/A 10/26/2016   Procedure: ESOPHAGOGASTRODUODENOSCOPY (EGD);  Surgeon: Daneil Dolin, MD;  Location: [facility];  Service: Endoscopy;  Laterality: N/A;   FOOT SURGERY     FOOT SURGERY Right 2011   shave bone   TONSILLECTOMY      SOCIAL  HISTORY:  Social History   Socioeconomic History   Marital status: Legally Separated    Spouse name: Not on file   Number of children: 1   Years of education: Not on file   Highest education level: Not on file  Occupational History   Not on file  Tobacco Use   Smoking status: Former    Packs/day: 0.25    Years: 3.00    Pack years: 0.75    Types: Cigarettes    Quit date: 10/21/2016    Years since quitting: 5.5   Smokeless tobacco: Never  Substance and Sexual Activity   Alcohol use: No    Comment: occ.   Drug use: Yes    Types: Marijuana    Comment: started using marijuana a month on the weekends to cope with depression.   Sexual activity: Not Currently    Birth control/protection: Surgical  Other Topics Concern   Not on file  Social History Narrative   Had one child now deceased   Lives with her sister, sister recently diagnosed with cancer.    Social Determinants of Health   Financial Resource Strain: Not on file  Food Insecurity: Not on file  Transportation Needs: Not on file  Physical Activity: Not on file  Stress: Not on file  Social Connections: Not on file  Intimate Partner Violence: Not on file    FAMILY HISTORY:  Family History  Problem Relation  Age of Onset   Diabetes Mother    Stroke Mother    Cancer Father        liver and colon. Patient unsure primary    Stomach cancer Father    Diabetes Sister    Stomach cancer Sister    Liver cancer Sister    Bone cancer Sister    Diabetes Brother    Lung cancer Brother    Heart disease Son    Breast cancer Neg Hx     CURRENT MEDICATIONS:  Current Outpatient Medications  Medication Sig Dispense Refill   Lancets (ONETOUCH ULTRASOFT) lancets Use as instructed 100 each 12   apixaban (ELIQUIS) 5 MG TABS tablet Take 2 tablets (54m) twice daily for 7 days, then 1 tablet (542m twice daily 60 tablet 0   atorvastatin (LIPITOR) 10 MG tablet Take 1 tablet (10 mg total) by mouth daily. 90 tablet 3    glyBURIDE-metformin (GLUCOVANCE) 5-500 MG tablet Take 1 tablet by mouth 2 (two) times daily. 60 tablet 3   lisinopril (ZESTRIL) 20 MG tablet Take 1 tablet (20 mg total) by mouth daily. 90 tablet 3   UNABLE TO FIND Blood glucose monitor kit check blood glucose twice daily dx: e11.65 1 each 0   UNABLE TO FIND Glucose test strips check blood glucose twice daily dx e11.65 100 each 2   No current facility-administered medications for this visit.    ALLERGIES:  Allergies  Allergen Reactions   Iohexol Swelling     Code: HIVES, Desc: PT STATES THE EYES SWOLL AND ITCHED, NEEDS PRE MEDS     PHYSICAL EXAM:  Performance status (ECOG): 0 - Asymptomatic  There were no vitals filed for this visit. Wt Readings from Last 3 Encounters:  04/07/22 250 lb (113.4 kg)  02/17/22 256 lb (116.1 kg)  12/15/21 256 lb 6.4 oz (116.3 kg)   Physical Exam  LABORATORY DATA:  I have reviewed the labs as listed.     Latest Ref Rng & Units 10/31/2021    2:05 PM 07/07/2019    6:18 AM 07/06/2019    5:58 AM  CBC  WBC 4.0 - 10.5 K/uL 7.4   5.7   6.1    Hemoglobin 12.0 - 15.0 g/dL 10.8   9.7   9.5    Hematocrit 36.0 - 46.0 % 35.7   32.9   32.3    Platelets 150 - 400 K/uL 233   184   202        Latest Ref Rng & Units 03/03/2022   10:03 AM 02/17/2022    9:11 AM 10/31/2021    2:05 PM  CMP  Glucose 70 - 99 mg/dL 120   320   94    BUN 6 - 24 mg/dL _0 Creatinine 0.57 - 1.00 mg/dL 0.80   0.66   0.60    Sodium 134 - 144 mmol/L 141   136   137    Potassium 3.5 - 5.2 mmol/L 4.2   4.6   3.8    Chloride 96 - 106 mmol/L 107   99   102    CO2 20 - 29 mmol/L _1 Calcium 8.7 - 10.2 mg/dL 9.5   9.7   8.7    Total Protein 6.0 - 8.5 g/dL  8.0   7.8    Total Bilirubin 0.0 - 1.2 mg/dL  0.3   0.3  Alkaline Phos 44 - 121 IU/L  98   58    AST 0 - 40 IU/L  13   13    ALT 0 - 32 IU/L  12   16        Component Value Date/Time   RBC 4.94 11/12/2021 1346   RBC 4.94 10/31/2021 1405   MCV 72.3 (L) 10/31/2021  1405   MCH 21.9 (L) 10/31/2021 1405   MCHC 30.3 10/31/2021 1405   RDW 16.8 (H) 10/31/2021 1405   LYMPHSABS 2.4 10/31/2021 1405   MONOABS 0.6 10/31/2021 1405   EOSABS 0.1 10/31/2021 1405   BASOSABS 0.0 10/31/2021 1405    DIAGNOSTIC IMAGING:  I have independently reviewed the scans and discussed with the patient. No results found.   ASSESSMENT:  Unprovoked left leg DVT: - She noticed left leg swelling 4 to 5 days prior to ER visit, was seen in urgent care. - Left leg ultrasound on 11/01/2021 with DVT noted in the left femoral, popliteal, posterior tibial and peroneal veins. - She is currently tolerating Eliquis very well. - She does not have any B symptoms.  She had 1 miscarriage, at 68-monthgestation. - Colonoscopy in 2016 with mild diverticulosis and multiple subcentimeter polyps removed.  EGD in 2017 showing esophagitis. - Last mammogram on 10/03/2011, normal.    Social/family history: - She lives with her family at home.  She has been on disability since April 2022 secondary to right leg problems.  She is able to do all her day-to-day activities and is active.  She worked in a [employer], [employer]  She quit smoking 1 year ago.  She smoked half pack per day for 6 years. - Father and brother died of lung cancer, both were smokers.  No family history of thrombosis.   PLAN:  Unprovoked left leg DVT: - She has not missed any doses of Eliquis. - She reported left lower shin feeling constant like "something crawling under her skin" sensation.  She reports pins-and-needles sensation when she takes deep breath in the same area. - Examination did not reveal any sign of infection or significant swelling.  There is mild discoloration with hyperpigmentation over the left shin. - We have repeated Doppler today.  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  Interval resolution of the femoral vein DVT. - I do not believe  that she is resistant to Eliquis.  Continue Eliquis at this time. - RTC 4 months with repeat labs and D-dimer.  2.  Microcytic anemia: - Work-up for macrocytosis and slight anemia showed ferritin 2625 folic acid, BW38 copper levels normal.  LDH was normal.  SPEP was negative.  Reticulocyte count was 1%.  No indication for further work-up.  3.  Health maintenance: - Reviewed mammogram results from 12/14/2021, BI-RADS 3 on the left breast diagnostic mammogram.  624-montheft breast mammogram will be scheduled.  Orders placed this encounter:  No orders of the defined types were placed in this encounter.    SrDerek JackMD [id number] I, KiThana Atesam acting as a scribe for Dr. SrDerek Jack {Add ScBaristatatement}

## 2022-04-27 ENCOUNTER — Other Ambulatory Visit (HOSPITAL_COMMUNITY): Payer: 59

## 2022-04-28 ENCOUNTER — Ambulatory Visit (HOSPITAL_COMMUNITY): Payer: 59 | Admitting: Hematology

## 2022-04-28 DIAGNOSIS — I824Y2 Acute embolism and thrombosis of unspecified deep veins of left proximal lower extremity: Secondary | ICD-10-CM

## 2022-05-01 ENCOUNTER — Emergency Department (HOSPITAL_COMMUNITY)
Admission: EM | Admit: 2022-05-01 | Discharge: 2022-05-01 | Disposition: A | Discharge status: Home / Self Care | Payer: 59 | Attending: Emergency Medicine | Admitting: Emergency Medicine

## 2022-05-01 ENCOUNTER — Emergency Department (HOSPITAL_COMMUNITY): Payer: 59

## 2022-05-01 ENCOUNTER — Encounter (HOSPITAL_COMMUNITY): Payer: Self-pay

## 2022-05-01 ENCOUNTER — Other Ambulatory Visit: Payer: Self-pay

## 2022-05-01 DIAGNOSIS — R109 Unspecified abdominal pain: Secondary | ICD-10-CM | POA: Diagnosis present

## 2022-05-01 DIAGNOSIS — Z79899 Other long term (current) drug therapy: Secondary | ICD-10-CM | POA: Diagnosis not present

## 2022-05-01 DIAGNOSIS — I10 Essential (primary) hypertension: Secondary | ICD-10-CM | POA: Diagnosis not present

## 2022-05-01 DIAGNOSIS — R739 Hyperglycemia, unspecified: Secondary | ICD-10-CM | POA: Insufficient documentation

## 2022-05-01 DIAGNOSIS — K529 Noninfective gastroenteritis and colitis, unspecified: Secondary | ICD-10-CM | POA: Insufficient documentation

## 2022-05-01 DIAGNOSIS — R1030 Lower abdominal pain, unspecified: Secondary | ICD-10-CM

## 2022-05-01 DIAGNOSIS — Z7901 Long term (current) use of anticoagulants: Secondary | ICD-10-CM | POA: Diagnosis not present

## 2022-05-01 DIAGNOSIS — R748 Abnormal levels of other serum enzymes: Secondary | ICD-10-CM | POA: Diagnosis not present

## 2022-05-01 LAB — CBC
HCT: 41.3 % (ref 36.0–46.0)
Hemoglobin: 13.2 g/dL (ref 12.0–15.0)
MCH: 21.5 pg — ABNORMAL LOW (ref 26.0–34.0)
MCHC: 32 g/dL (ref 30.0–36.0)
MCV: 67.2 fL — ABNORMAL LOW (ref 80.0–100.0)
Platelets: 293 10*3/uL (ref 150–400)
RBC: 6.15 MIL/uL — ABNORMAL HIGH (ref 3.87–5.11)
RDW: 17.3 % — ABNORMAL HIGH (ref 11.5–15.5)
WBC: 16.7 10*3/uL — ABNORMAL HIGH (ref 4.0–10.5)
nRBC: 0 % (ref 0.0–0.2)

## 2022-05-01 LAB — URINALYSIS, ROUTINE W REFLEX MICROSCOPIC
Bacteria, UA: NONE SEEN
Bilirubin Urine: NEGATIVE
Glucose, UA: >=500 mg/dL — AB
Ketones, ur: NEGATIVE mg/dL
Leukocytes,Ua: NEGATIVE
Nitrite: NEGATIVE
Protein, ur: 100 mg/dL — AB
Specific Gravity, Urine: 1.03 (ref 1.005–1.030)
pH: 6 (ref 5.0–8.0)

## 2022-05-01 LAB — COMPREHENSIVE METABOLIC PANEL
ALT: 15 U/L (ref 0–44)
AST: 13 U/L — ABNORMAL LOW (ref 15–41)
Albumin: 4 g/dL (ref 3.5–5.0)
Alkaline Phosphatase: 71 U/L (ref 38–126)
Anion gap: 9 (ref 5–15)
BUN: 19 mg/dL (ref 6–20)
CO2: 25 mmol/L (ref 22–32)
Calcium: 9.3 mg/dL (ref 8.9–10.3)
Chloride: 93 mmol/L — ABNORMAL LOW (ref 98–111)
Creatinine, Ser: 0.95 mg/dL (ref 0.44–1.00)
GFR, Estimated: >60 mL/min (ref >60)
Glucose, Bld: 434 mg/dL — ABNORMAL HIGH (ref 70–99)
Potassium: 3.9 mmol/L (ref 3.5–5.1)
Sodium: 127 mmol/L — ABNORMAL LOW (ref 135–145)
Total Bilirubin: 0.5 mg/dL (ref 0.3–1.2)
Total Protein: 9.3 g/dL — ABNORMAL HIGH (ref 6.5–8.1)

## 2022-05-01 LAB — LIPASE, BLOOD: Lipase: 160 U/L — ABNORMAL HIGH (ref 11–51)

## 2022-05-01 LAB — CBG MONITORING, ED: Glucose-Capillary: 385 mg/dL — ABNORMAL HIGH (ref 70–99)

## 2022-05-01 MED ORDER — ONDANSETRON 8 MG PO TBDP: 8.0000 mg | ORAL_TABLET | Freq: Three times a day (TID) | ORAL | 0 refills | Status: DC | PRN

## 2022-05-01 MED ORDER — ONDANSETRON HCL 4 MG/2ML IJ SOLN
4.0000 mg | Freq: Once | INTRAMUSCULAR | Status: AC
  Administered 2022-05-01: 4 mg via INTRAVENOUS
  Filled 2022-05-01: qty 2

## 2022-05-01 MED ORDER — AMOXICILLIN-POT CLAVULANATE 875-125 MG PO TABS: 1.0000 | ORAL_TABLET | Freq: Two times a day (BID) | ORAL | 0 refills | Status: DC

## 2022-05-01 MED ORDER — SODIUM CHLORIDE 0.9 % IV BOLUS
1000.0000 mL | Freq: Once | INTRAVENOUS | Status: AC
  Administered 2022-05-01: 1000 mL via INTRAVENOUS

## 2022-05-01 MED ORDER — LISINOPRIL 10 MG PO TABS
20.0000 mg | ORAL_TABLET | Freq: Once | ORAL | Status: AC
  Administered 2022-05-01: 20 mg via ORAL
  Filled 2022-05-01: qty 2

## 2022-05-01 MED ORDER — PIPERACILLIN-TAZOBACTAM 3.375 G IVPB 30 MIN
3.3750 g | Freq: Once | INTRAVENOUS | Status: AC
  Administered 2022-05-01: 3.375 g via INTRAVENOUS
  Filled 2022-05-01: qty 50

## 2022-05-01 MED ORDER — ONDANSETRON 4 MG PO TBDP
4.0000 mg | ORAL_TABLET | Freq: Once | ORAL | Status: AC | PRN
  Administered 2022-05-01: 4 mg via ORAL
  Filled 2022-05-01: qty 1

## 2022-05-01 MED ORDER — INSULIN ASPART 100 UNIT/ML IJ SOLN
12.0000 [IU] | Freq: Once | INTRAMUSCULAR | Status: AC
  Administered 2022-05-01: 12 [IU] via SUBCUTANEOUS
  Filled 2022-05-01: qty 1

## 2022-05-01 MED ORDER — TRAMADOL HCL 50 MG PO TABS: 50.0000 mg | ORAL_TABLET | Freq: Four times a day (QID) | ORAL | 0 refills | Status: DC | PRN

## 2022-05-01 MED ORDER — LACTATED RINGERS IV BOLUS
1000.0000 mL | Freq: Once | INTRAVENOUS | Status: AC
  Administered 2022-05-01: 1000 mL via INTRAVENOUS

## 2022-05-01 MED ORDER — HYDROMORPHONE HCL 1 MG/ML IJ SOLN
0.5000 mg | Freq: Once | INTRAMUSCULAR | Status: AC
  Administered 2022-05-01: 0.5 mg via INTRAVENOUS
  Filled 2022-05-01: qty 0.5

## 2022-05-01 NOTE — ED Provider Notes (Signed)
[facility] EMERGENCY DEPARTMENT Provider Note   CSN: [id number] Arrival date & time: 05/01/22  1616     History  Chief Complaint  Patient presents with   Abdominal Pain    Shawna Huerta is a 61 y.o. female.  Pt c/o mid to lower abd pain in the past two days, with nvd. Emesis not bloody or bilious. Diarrhea is loose to watery, not bloody. Had one episode emesis today, and 1-2 diarrheal stools. Dull abd pain, non radiating, constant. Pt unsure if same as prior diverticulitis symptoms. No dysuria. No vaginal discharge or bleeding - remote hx cholecystectomy and hysterectomy. Denies known ill contacts or bad food ingestion. No recent abx use. No fever or chills.   The history is provided by the patient and medical records.  Abdominal Pain Associated symptoms: diarrhea and vomiting   Associated symptoms: no chest pain, no chills, no dysuria, no fever, no shortness of breath and no sore throat       Home Medications Prior to Admission medications   Medication Sig Start Date End Date Taking? Authorizing Provider  Lancets [facility] ULTRASOFT) lancets Use as instructed 04/13/22   Renee Rival, FNP  apixaban (ELIQUIS) 5 MG TABS tablet Take 2 tablets (55m) twice daily for 7 days, then 1 tablet (537m twice daily 11/01/21   FaMarcello FennelPA-C  atorvastatin (LIPITOR) 10 MG tablet Take 1 tablet (10 mg total) by mouth daily. 02/18/22   Paseda, FoDewaine CongerFNP  glyBURIDE-metformin (GLUCOVANCE) 5-500 MG tablet Take 1 tablet by mouth 2 (two) times daily. 02/17/22   Paseda, FoDewaine CongerFNP  lisinopril (ZESTRIL) 20 MG tablet Take 1 tablet (20 mg total) by mouth daily. 02/17/22   PaRenee RivalFNP  UNABLE TO FIND Blood glucose monitor kit check blood glucose twice daily dx: e11.65 04/12/22   PaRenee RivalFNP  UNABLE TO FIND Glucose test strips check blood glucose twice daily dx e11.65 04/12/22   PaRenee RivalFNP      Allergies    Iohexol    Review of Systems    Review of Systems  Constitutional:  Negative for chills and fever.  HENT:  Negative for sore throat.   Eyes:  Negative for redness.  Respiratory:  Negative for shortness of breath.   Cardiovascular:  Negative for chest pain.  Gastrointestinal:  Positive for abdominal pain, diarrhea and vomiting.  Genitourinary:  Negative for dysuria and flank pain.  Musculoskeletal:  Negative for back pain and neck pain.  Skin:  Negative for rash.  Neurological:  Negative for headaches.  Hematological:  Does not bruise/bleed easily.  Psychiatric/Behavioral:  Negative for confusion.    Physical Exam Updated Vital Signs BP (!) 184/97 (BP Location: Right Arm)   Pulse 100   Temp 98.7 F (37.1 C) (Oral)   Resp 20   Ht 1.702 m ('5\' 7"' )   Wt 111.1 kg   SpO2 97%   BMI 38.37 kg/m  Physical Exam Vitals and nursing note reviewed.  Constitutional:      Appearance: Normal appearance. She is well-developed.  HENT:     Head: Atraumatic.     Nose: Nose normal.     Mouth/Throat:     Mouth: Mucous membranes are moist.  Eyes:     General: No scleral icterus.    Conjunctiva/sclera: Conjunctivae normal.  Neck:     Trachea: No tracheal deviation.  Cardiovascular:     Rate and Rhythm: Normal rate and regular rhythm.  Pulses: Normal pulses.     Heart sounds: Normal heart sounds. No murmur heard.   No friction rub. No gallop.  Pulmonary:     Effort: Pulmonary effort is normal. No respiratory distress.     Breath sounds: Normal breath sounds.  Abdominal:     General: Bowel sounds are normal. There is no distension.     Palpations: Abdomen is soft. There is no mass.     Tenderness: There is abdominal tenderness. There is no guarding or rebound.     Hernia: No hernia is present.     Comments: Mid to lower abd tenderness.   Genitourinary:    Comments: No cva tenderness.  Musculoskeletal:        General: No swelling or tenderness.     Cervical back: Normal range of motion and neck supple. No rigidity.  No muscular tenderness.     Right lower leg: No edema.     Left lower leg: No edema.  Skin:    General: Skin is warm and dry.     Findings: No rash.  Neurological:     Mental Status: She is alert.     Comments: Alert, speech normal.   Psychiatric:        Mood and Affect: Mood normal.    ED Results / Procedures / Treatments   Labs (all labs ordered are listed, but only abnormal results are displayed) Results for orders placed or performed during the hospital encounter of 05/01/22  Lipase, blood  Result Value Ref Range   Lipase 160 (H) 11 - 51 U/L  Comprehensive metabolic panel  Result Value Ref Range   Sodium 127 (L) 135 - 145 mmol/L   Potassium 3.9 3.5 - 5.1 mmol/L   Chloride 93 (L) 98 - 111 mmol/L   CO2 25 22 - 32 mmol/L   Glucose, Bld 434 (H) 70 - 99 mg/dL   BUN 19 6 - 20 mg/dL   Creatinine, Ser 0.95 0.44 - 1.00 mg/dL   Calcium 9.3 8.9 - 10.3 mg/dL   Total Protein 9.3 (H) 6.5 - 8.1 g/dL   Albumin 4.0 3.5 - 5.0 g/dL   AST 13 (L) 15 - 41 U/L   ALT 15 0 - 44 U/L   Alkaline Phosphatase 71 38 - 126 U/L   Total Bilirubin 0.5 0.3 - 1.2 mg/dL   GFR, Estimated >60 >60 mL/min   Anion gap 9 5 - 15  CBC  Result Value Ref Range   WBC 16.7 (H) 4.0 - 10.5 K/uL   RBC 6.15 (H) 3.87 - 5.11 MIL/uL   Hemoglobin 13.2 12.0 - 15.0 g/dL   HCT 41.3 36.0 - 46.0 %   MCV 67.2 (L) 80.0 - 100.0 fL   MCH 21.5 (L) 26.0 - 34.0 pg   MCHC 32.0 30.0 - 36.0 g/dL   RDW 17.3 (H) 11.5 - 15.5 %   Platelets 293 150 - 400 K/uL   nRBC 0.0 0.0 - 0.2 %  Urinalysis, Routine w reflex microscopic  Result Value Ref Range   Color, Urine YELLOW YELLOW   APPearance CLEAR CLEAR   Specific Gravity, Urine 1.030 1.005 - 1.030   pH 6.0 5.0 - 8.0   Glucose, UA >=500 (A) NEGATIVE mg/dL   Hgb urine dipstick SMALL (A) NEGATIVE   Bilirubin Urine NEGATIVE NEGATIVE   Ketones, ur NEGATIVE NEGATIVE mg/dL   Protein, ur 100 (A) NEGATIVE mg/dL   Nitrite NEGATIVE NEGATIVE   Leukocytes,Ua NEGATIVE NEGATIVE   RBC / HPF 0-5  0 - 5 RBC/hpf   WBC, UA 0-5 0 - 5 WBC/hpf   Bacteria, UA NONE SEEN NONE SEEN   Squamous Epithelial / LPF 0-5 0 - 5   Mucus PRESENT       EKG None  Radiology CT Abdomen Pelvis Wo Contrast  Result Date: 05/01/2022 CLINICAL DATA:  Abdominal pain, acute, nonlocalized EXAM: CT ABDOMEN AND PELVIS WITHOUT CONTRAST TECHNIQUE: Multidetector CT imaging of the abdomen and pelvis was performed following the standard protocol without IV contrast. RADIATION DOSE REDUCTION: This exam was performed according to the departmental dose-optimization program which includes automated exposure control, adjustment of the mA and/or kV according to patient size and/or use of iterative reconstruction technique. COMPARISON:  CT dated July 05, 2019 FINDINGS: Evaluation is limited by lack IV contrast. Lower chest: No acute abnormality. Hepatobiliary: Status post cholecystectomy. Focal fatty deposition adjacent to the falciform ligament. Pancreas: No peripancreatic fat stranding. Spleen: Unremarkable. Adrenals/Urinary Tract: Adrenal glands are unremarkable. No hydronephrosis. No obstructing nephrolithiasis. Bladder is decompressed. There is a punctate nonobstructive LEFT-sided nephrolithiasis Stomach/Bowel: No evidence of bowel obstruction. Scattered diverticulosis. Mild diffuse prominence of the sigmoid and descending colon walls. Appendix is normal. Stomach is unremarkable. Vascular/Lymphatic: No significant vascular findings are present. No enlarged abdominal or pelvic lymph nodes. Reproductive: Status post hysterectomy. No adnexal masses. Other: No free air or free fluid. Musculoskeletal: No acute or significant osseous findings. IMPRESSION: 1. Mild prominence of predominately the sigmoid colon walls. This could reflect a nonspecific colitis in the appropriate clinical setting. Recommend correlation with colon cancer screening history. No evidence of acute diverticulitis. Electronically Signed   By: Valentino Saxon M.D.    On: 05/01/2022 19:00    Procedures Procedures    Medications Ordered in ED Medications  sodium chloride 0.9 % bolus 1,000 mL (has no administration in time range)  HYDROmorphone (DILAUDID) injection 0.5 mg (has no administration in time range)  ondansetron (ZOFRAN) injection 4 mg (has no administration in time range)  ondansetron (ZOFRAN-ODT) disintegrating tablet 4 mg (4 mg Oral Given 05/01/22 1648)    ED Course/ Medical Decision Making/ A&P                           Medical Decision Making Problems Addressed: Colitis: acute illness or injury with systemic symptoms that poses a threat to life or bodily functions Elevated lipase: acute illness or injury Essential hypertension: chronic illness or injury with exacerbation, progression, or side effects of treatment that poses a threat to life or bodily functions Hyperglycemia: acute illness or injury with systemic symptoms that poses a threat to life or bodily functions Lower abdominal pain: acute illness or injury with systemic symptoms Uncontrolled hypertension: chronic illness or injury with exacerbation, progression, or side effects of treatment that poses a threat to life or bodily functions  Amount and/or Complexity of Data Reviewed External Data Reviewed: notes. Labs: ordered. Decision-making details documented in ED Course. Radiology: ordered and independent interpretation performed. Decision-making details documented in ED Course.  Risk Prescription drug management. Decision regarding hospitalization.   Iv ns. Continuous pulse ox and cardiac monitoring. Labs ordered/sent. Imaging ordered.   Differential diagnosis includes colitis, diverticulitis, sbo, etc. Disposition decision includes potential need for admission. Will get labs and imaging and revisit dispo decision.   Reviewed nursing notes and prior charts for additional history. External reports reviewed.   Ns bolus iv. Dilaudid iv. Zofran iv.   Cardiac monitor:  sinus rhythm, rate 90.  Labs reviewed/interpreted  by me - wbc elev. Glucose high. Hco3 normal. Ivf. Novolog sq.   CT reviewed/interpreted by me - possible colitis/diverticulitis.   Unasyn iv. Lr bolus.   Pain improved. Tolerating po.   Pt appears stable for d/c.  Rec pcp f/u.  Return precautions provided.           Final Clinical Impression(s) / ED Diagnoses Final diagnoses:  None    Rx / DC Orders ED Discharge Orders     None         Lajean Saver, MD 05/17/22 1108

## 2022-05-01 NOTE — ED Triage Notes (Addendum)
Pt c/o 2 days of lower abd pain with N/V/D. Pt reports a low grade fever TMax of 99.2. Pt with a hx of diverticulitis.

## 2022-05-01 NOTE — Discharge Instructions (Addendum)
It was our pleasure to provide your ER care today - we hope that you feel better.  Drink plenty of fluids/stay well hydrated.   Take antibiotic as prescribed (augmentin). Take acetaminophen or ibuprofen as need. Take zofran as need for nausea. You may take ultram as need for pain - no driving when taking.   Your blood sugar and blood pressure are high. Stay well hydrated, eat heart health diabetes meal plan, continue meds, monitor glucose, and follow up closely with primary care doctor in the coming week.  Return to ER if worse, new symptoms, high fevers, worsening/severe pain, persistent vomiting, or other concern.

## 2022-06-07 ENCOUNTER — Ambulatory Visit (INDEPENDENT_AMBULATORY_CARE_PROVIDER_SITE_OTHER): Payer: 59 | Admitting: Nurse Practitioner

## 2022-06-07 ENCOUNTER — Encounter: Payer: Self-pay | Admitting: Nurse Practitioner

## 2022-06-07 VITALS — BP 132/80 | HR 88 | Ht 67.0 in | Wt 231.0 lb

## 2022-06-07 DIAGNOSIS — I824Y2 Acute embolism and thrombosis of unspecified deep veins of left proximal lower extremity: Secondary | ICD-10-CM

## 2022-06-07 DIAGNOSIS — I10 Essential (primary) hypertension: Secondary | ICD-10-CM | POA: Diagnosis not present

## 2022-06-07 DIAGNOSIS — E1165 Type 2 diabetes mellitus with hyperglycemia: Secondary | ICD-10-CM

## 2022-06-07 DIAGNOSIS — E785 Hyperlipidemia, unspecified: Secondary | ICD-10-CM

## 2022-06-07 DIAGNOSIS — Z0001 Encounter for general adult medical examination with abnormal findings: Secondary | ICD-10-CM | POA: Diagnosis not present

## 2022-06-07 DIAGNOSIS — Z0000 Encounter for general adult medical examination without abnormal findings: Secondary | ICD-10-CM | POA: Insufficient documentation

## 2022-06-07 NOTE — Progress Notes (Signed)
Complete physical exam  Patient: Shawna Huerta   DOB: 04/20/1961   61 y.o. Female  MRN: [id number]  Subjective:    Chief Complaint  Patient presents with   Annual Exam    cpe    Shawna Huerta is a 61 y.o. female with past medical history of essential hypertension, left leg DVT, IBS, uncontrolled type 2 diabetes, obesity, hyperlipidemia who presents today for a complete physical exam. She reports consuming a general diet. She does some walking excercises 30 minutes daily when she can  She generally feels well. She reports sleeping well. She does not have additional problems to discuss today.   She lost her sister in May 2023, stated that she is doing well , denies depression , SI,HI.  She refused referral for counseling.  Emotional support given today  Due For shingles vaccines, Tdap vaccine need for both vaccines discussed patient encouraged to get vaccines at her pharmacy.  She wears prescription glasses patient stated that she will call her doctor's office to schedule an appointment for eye exam.     Most recent fall risk assessment:    06/07/2022    9:08 AM  [place] in the past year? 0  Number falls in past yr: 0  Injury with Fall? 0  Risk for fall due to : No Fall Risks  Follow up Falls evaluation completed     Most recent depression screenings:    06/07/2022    9:08 AM 04/07/2022    8:56 AM  PHQ 2/9 Scores  PHQ - 2 Score 0 0        Patient Care Team: Renee Rival, FNP as PCP - General (Nurse Practitioner)   Outpatient Medications Prior to Visit  Medication Sig Note   atorvastatin (LIPITOR) 10 MG tablet Take 1 tablet (10 mg total) by mouth daily.    diclofenac Sodium (VOLTAREN) 1 % GEL  06/07/2022: As needed   glyBURIDE-metformin (GLUCOVANCE) 5-500 MG tablet Take 1 tablet by mouth 2 (two) times daily.    Lancets (ONETOUCH ULTRASOFT) lancets Use as instructed    lisinopril (ZESTRIL) 20 MG tablet Take 1 tablet (20 mg total) by mouth daily.     ondansetron (ZOFRAN-ODT) 8 MG disintegrating tablet Take 1 tablet (8 mg total) by mouth every 8 (eight) hours as needed for nausea or vomiting.    ONETOUCH VERIO test strip 2 (two) times daily.    UNABLE TO FIND Blood glucose monitor kit check blood glucose twice daily dx: e11.65    UNABLE TO FIND Glucose test strips check blood glucose twice daily dx e11.65    apixaban (ELIQUIS) 5 MG TABS tablet Take 2 tablets (79m) twice daily for 7 days, then 1 tablet (556m twice daily (Patient not taking: Reported on 06/07/2022)    traMADol (ULTRAM) 50 MG tablet Take 1 tablet (50 mg total) by mouth every 6 (six) hours as needed. (Patient not taking: Reported on 06/07/2022)    [DISCONTINUED] amoxicillin-clavulanate (AUGMENTIN) 875-125 MG tablet Take 1 tablet by mouth every 12 (twelve) hours.    No facility-administered medications prior to visit.    Review of Systems  Constitutional: Negative.  Negative for chills, fever, malaise/fatigue and weight loss.  HENT: Negative.  Negative for congestion, ear discharge, ear pain, hearing loss, nosebleeds and tinnitus.   Eyes:  Negative for blurred vision, pain, discharge and redness.  Respiratory: Negative.  Negative for cough, hemoptysis, sputum production, shortness of breath and wheezing.   Cardiovascular: Negative.  Negative for chest pain, palpitations, orthopnea, claudication, leg swelling and PND.  Gastrointestinal: Negative.  Negative for blood in stool, constipation, diarrhea, heartburn, melena, nausea and vomiting.  Genitourinary: Negative.  Negative for dysuria, frequency, hematuria and urgency.  Musculoskeletal: Negative.  Negative for back pain, falls, joint pain, myalgias and neck pain.  Skin: Negative.  Negative for itching and rash.  Neurological: Negative.  Negative for dizziness, tingling, tremors, sensory change, speech change, focal weakness, weakness and headaches.  Endo/Heme/Allergies: Negative.  Negative for environmental allergies and  polydipsia. Does not bruise/bleed easily.  Psychiatric/Behavioral: Negative.  Negative for depression, hallucinations, memory loss, substance abuse and suicidal ideas. The patient is not nervous/anxious and does not have insomnia.           Objective:     BP 132/80 (BP Location: Left Arm, Patient Position: Sitting, Cuff Size: Large)   Pulse 88   Ht '5\' 7"'  (1.702 m)   Wt 231 lb (104.8 kg)   SpO2 97%   BMI 36.18 kg/m    Physical Exam Exam conducted with a chaperone present.  Constitutional:      General: She is not in acute distress.    Appearance: She is obese. She is not ill-appearing or toxic-appearing.  HENT:     Head: Normocephalic and atraumatic.     Right Ear: Tympanic membrane, ear canal and external ear normal. There is no impacted cerumen.     Left Ear: Tympanic membrane, ear canal and external ear normal. There is no impacted cerumen.     Nose: No congestion or rhinorrhea.     Mouth/Throat:     Mouth: Mucous membranes are moist.     Pharynx: Oropharynx is clear. No oropharyngeal exudate or posterior oropharyngeal erythema.  Eyes:     General: No scleral icterus.       Right eye: No discharge.        Left eye: No discharge.     Extraocular Movements: Extraocular movements intact.     Conjunctiva/sclera: Conjunctivae normal.     Pupils: Pupils are equal, round, and reactive to light.  Neck:     Vascular: No carotid bruit.  Cardiovascular:     Rate and Rhythm: Normal rate and regular rhythm.     Pulses: Normal pulses.     Heart sounds: Normal heart sounds. No murmur heard.    No friction rub. No gallop.  Pulmonary:     Effort: Pulmonary effort is normal. No respiratory distress.     Breath sounds: Normal breath sounds. No stridor. No wheezing, rhonchi or rales.  Chest:     Chest wall: No mass, lacerations, deformity, swelling, tenderness, crepitus or edema.  Breasts:    Tanner Score is 5.     Breasts are symmetrical.     Right: Normal. No swelling,  bleeding, inverted nipple, mass, nipple discharge, skin change or tenderness.     Left: Normal. No swelling, bleeding, inverted nipple, mass, nipple discharge, skin change or tenderness.  Abdominal:     General: There is no distension.     Palpations: Abdomen is soft. There is no mass.     Tenderness: There is no abdominal tenderness. There is no right CVA tenderness, left CVA tenderness, guarding or rebound.     Hernia: No hernia is present.     Comments: Mild tenderness on mid lower abdomen  Musculoskeletal:        General: No swelling, tenderness, deformity or signs of injury. Normal range of motion.  Cervical back: Normal range of motion and neck supple. No rigidity or tenderness.     Right lower leg: No edema.     Left lower leg: No edema.  Lymphadenopathy:     Cervical: No cervical adenopathy.     Upper Body:     Right upper body: No supraclavicular, axillary or pectoral adenopathy.     Left upper body: No supraclavicular, axillary or pectoral adenopathy.  Skin:    General: Skin is warm and dry.     Capillary Refill: Capillary refill takes less than 2 seconds.     Coloration: Skin is not jaundiced or pale.     Findings: No bruising, erythema, lesion or rash.  Neurological:     Mental Status: She is alert and oriented to person, place, and time.     Cranial Nerves: No cranial nerve deficit.     Sensory: No sensory deficit.     Motor: No weakness.     Coordination: Coordination normal.     Gait: Gait normal.     Deep Tendon Reflexes: Reflexes normal.  Psychiatric:        Mood and Affect: Mood normal.        Behavior: Behavior normal.        Thought Content: Thought content normal.        Judgment: Judgment normal.      No results found for any visits on 06/07/22.     Assessment & Plan:    Routine Health Maintenance and Physical Exam  Immunization History  Administered Date(s) Administered   Influenza,inj,Quad PF,6+ Mos 02/17/2022   Moderna SARS-COV2 Booster  Vaccination 10/29/2020   Moderna Sars-Covid-2 Vaccination 01/30/2020, 03/03/2020, 10/01/2021    Health Maintenance  Topic Date Due   Hepatitis C Screening  Never done   TETANUS/TDAP  Never done   Zoster Vaccines- Shingrix (1 of 2) Never done   INFLUENZA VACCINE  06/28/2022   HEMOGLOBIN A1C  08/20/2022   OPHTHALMOLOGY EXAM  02/23/2023   FOOT EXAM  04/08/2023   MAMMOGRAM  11/18/2023   COLONOSCOPY (Pts 45-11yr Insurance coverage will need to be confirmed)  11/09/2025   HIV Screening  Completed   HPV VACCINES  Aged Out   PAP SMEAR-Modifier  Discontinued   COVID-19 Vaccine  Discontinued    Discussed health benefits of physical activity, and encouraged her to engage in regular exercise appropriate for her age and condition.  Problem List Items Addressed This Visit       Cardiovascular and Mediastinum   Essential hypertension    BP Readings from Last 3 Encounters:  06/07/22 132/80  05/01/22 (!) 190/97  04/07/22 132/80  Chronic condition well-controlled Continue lisinopril 20 mg daily DASH diet advised engage in regular moderate exercises at least for 50 minutes weekly Lab Results  Component Value Date   NA 127 (L) 05/01/2022   K 3.9 05/01/2022   CO2 25 05/01/2022   GLUCOSE 434 (H) 05/01/2022   BUN 19 05/01/2022   CREATININE 0.95 05/01/2022   CALCIUM 9.3 05/01/2022   EGFR 85 03/03/2022   GFRNONAA >60 05/01/2022       Left leg DVT (HTobias    Stated that she has stopped taking Eliquis in April She stated that hematologist told her she no longer needs to be taking Eliquis.she also stated that they told her that she does not need to follow up with them anymore.  Review of notes from hematology in January shows that patient should still be taking Eliquis. Patient encouraged to  contact hematologist  office today and reschedule the appointment that she missed . She denies hematemesis, bloody stool        Endocrine   Uncontrolled type 2 diabetes mellitus with hyperglycemia,  without long-term current use of insulin (HCC)    Currently taking Glucovance 5-500 mg 1 tablet twice daily. Patient stated that her fasting sugars as being in the 120s she denies hypoglycemia Avoid sugar sweets soda Check A1c today Follow-up in 4 months if A1c is under control      Relevant Orders   HgB A1c     Other   Hyperlipidemia LDL goal <70    On atorvastatin 10 mg daily LDL goal is less than 70 Check lipid panel Avoid fatty fried foods      Relevant Orders   Lipid Profile   Annual physical exam - Primary    Annual exam as documented.  Counseling done include healthy lifestyle involving committing to 150 minutes of exercise per week, heart healthy diet, and attaining healthy weight. The importance of adequate sleep also discussed.  . Encouraged to get her shingles and Tdap vaccine.  Has upcoming diagnostic mammogram.       Relevant Orders   Vitamin D (25 hydroxy)   TSH   Hepatitis C Antibody   Return in about 4 months (around 10/08/2022) for htn/dm/hld.     Renee Rival, FNP

## 2022-06-07 NOTE — Assessment & Plan Note (Signed)
On atorvastatin 10 mg daily LDL goal is less than 70 Check lipid panel Avoid fatty fried foods

## 2022-06-07 NOTE — Assessment & Plan Note (Signed)
Stated that she has stopped taking Eliquis in April She stated that hematologist told her she no longer needs to be taking Eliquis.she also stated that they told her that she does not need to follow up with them anymore.  Review of notes from hematology in January shows that patient should still be taking Eliquis. Patient encouraged to contact hematologist  office today and reschedule the appointment that she missed . She denies hematemesis, bloody stool

## 2022-06-07 NOTE — Patient Instructions (Addendum)
Please call oncology office today and schedule a follow up visit with them .   Please get your shingles and TADP  vaccines at your pharmacy.     It is important that you exercise regularly at least 30 minutes 5 times a week.  Think about what you will eat, plan ahead. Choose " clean, green, fresh or frozen" over canned, processed or packaged foods which are more sugary, salty and fatty. 70 to 75% of food eaten should be vegetables and fruit. Three meals at set times with snacks allowed between meals, but they must be fruit or vegetables. Aim to eat over a 12 hour period , example 7 am to 7 pm, and STOP after  your last meal of the day. Drink water,generally about 64 ounces per day, no other drink is as healthy. Fruit juice is best enjoyed in a healthy way, by EATING the fruit.  Thanks for choosing [facility], we consider it a privelige to serve you.

## 2022-06-07 NOTE — Assessment & Plan Note (Signed)
BP Readings from Last 3 Encounters:  06/07/22 132/80  05/01/22 (!) 190/97  04/07/22 132/80  Chronic condition well-controlled Continue lisinopril 20 mg daily DASH diet advised engage in regular moderate exercises at least for 50 minutes weekly Lab Results  Component Value Date   NA 127 (L) 05/01/2022   K 3.9 05/01/2022   CO2 25 05/01/2022   GLUCOSE 434 (H) 05/01/2022   BUN 19 05/01/2022   CREATININE 0.95 05/01/2022   CALCIUM 9.3 05/01/2022   EGFR 85 03/03/2022   GFRNONAA >60 05/01/2022

## 2022-06-07 NOTE — Assessment & Plan Note (Signed)
Annual exam as documented.  Counseling done include healthy lifestyle involving committing to 150 minutes of exercise per week, heart healthy diet, and attaining healthy weight. The importance of adequate sleep also discussed.  . Encouraged to get her shingles and Tdap vaccine.  Has upcoming diagnostic mammogram.

## 2022-06-07 NOTE — Assessment & Plan Note (Signed)
Currently taking Glucovance 5-500 mg 1 tablet twice daily. Patient stated that her fasting sugars as being in the 120s she denies hypoglycemia Avoid sugar sweets soda Check A1c today Follow-up in 4 months if A1c is under control

## 2022-06-08 ENCOUNTER — Other Ambulatory Visit: Payer: Self-pay | Admitting: Nurse Practitioner

## 2022-06-08 DIAGNOSIS — E119 Type 2 diabetes mellitus without complications: Secondary | ICD-10-CM

## 2022-06-08 DIAGNOSIS — E559 Vitamin D deficiency, unspecified: Secondary | ICD-10-CM

## 2022-06-08 LAB — LIPID PANEL
Chol/HDL Ratio: 3.8 ratio (ref 0.0–4.4)
Cholesterol, Total: 122 mg/dL (ref 100–199)
HDL: 32 mg/dL — ABNORMAL LOW (ref >39)
LDL Chol Calc (NIH): 67 mg/dL (ref 0–99)
Triglycerides: 131 mg/dL (ref 0–149)
VLDL Cholesterol Cal: 23 mg/dL (ref 5–40)

## 2022-06-08 LAB — HEMOGLOBIN A1C
Est. average glucose Bld gHb Est-mCnc: 192 mg/dL
Hgb A1c MFr Bld: 8.3 % — ABNORMAL HIGH (ref 4.8–5.6)

## 2022-06-08 LAB — TSH: TSH: 1.05 u[IU]/mL (ref 0.450–4.500)

## 2022-06-08 LAB — HEPATITIS C ANTIBODY: Hep C Virus Ab: NONREACTIVE

## 2022-06-08 LAB — VITAMIN D 25 HYDROXY (VIT D DEFICIENCY, FRACTURES): Vit D, 25-Hydroxy: 5.9 ng/mL — ABNORMAL LOW (ref 30.0–100.0)

## 2022-06-08 MED ORDER — VITAMIN D (ERGOCALCIFEROL) 1.25 MG (50000 UNIT) PO CAPS: 50000.0000 [IU] | ORAL_CAPSULE | ORAL | 1 refills | Status: DC

## 2022-06-08 MED ORDER — GLYBURIDE-METFORMIN 5-500 MG PO TABS: ORAL_TABLET | ORAL | 3 refills | Status: DC

## 2022-06-08 NOTE — Progress Notes (Signed)
Vitamin D level is low , start taking vitamin D 50000 units once weekly.   A1C is much improved but not at goal of less than 7.  Start glucovance 2 tablets  by mouth in the morning and one tablet by mouth at night.  Goal for fasting blood sugar ranges from 80 to 120 and 2 hours after any meal or at bedtime should be between 130 to 170.  Pt should monitor Blood sugar daily and report if getting any number less than this.    Other labs are normal.   Did Shawna Huerta call the oncologist's office yesterday? What's the update on her eliquis   Thanks

## 2022-06-16 ENCOUNTER — Inpatient Hospital Stay (HOSPITAL_COMMUNITY)
Admission: EM | Admit: 2022-06-16 | Discharge: 2022-06-20 | DRG: 871 | Disposition: A | Discharge status: Home / Self Care | Payer: 59 | Attending: Family Medicine | Admitting: Family Medicine

## 2022-06-16 ENCOUNTER — Emergency Department (HOSPITAL_COMMUNITY): Payer: 59

## 2022-06-16 ENCOUNTER — Other Ambulatory Visit: Payer: Self-pay

## 2022-06-16 ENCOUNTER — Encounter (HOSPITAL_COMMUNITY): Payer: Self-pay

## 2022-06-16 ENCOUNTER — Other Ambulatory Visit: Payer: Self-pay | Admitting: Nurse Practitioner

## 2022-06-16 DIAGNOSIS — E669 Obesity, unspecified: Secondary | ICD-10-CM | POA: Diagnosis present

## 2022-06-16 DIAGNOSIS — K529 Noninfective gastroenteritis and colitis, unspecified: Secondary | ICD-10-CM | POA: Diagnosis present

## 2022-06-16 DIAGNOSIS — R6510 Systemic inflammatory response syndrome (SIRS) of non-infectious origin without acute organ dysfunction: Secondary | ICD-10-CM

## 2022-06-16 DIAGNOSIS — R6520 Severe sepsis without septic shock: Secondary | ICD-10-CM | POA: Diagnosis present

## 2022-06-16 DIAGNOSIS — E119 Type 2 diabetes mellitus without complications: Secondary | ICD-10-CM

## 2022-06-16 DIAGNOSIS — A419 Sepsis, unspecified organism: Principal | ICD-10-CM | POA: Diagnosis present

## 2022-06-16 DIAGNOSIS — Z66 Do not resuscitate: Secondary | ICD-10-CM | POA: Diagnosis present

## 2022-06-16 DIAGNOSIS — I10 Essential (primary) hypertension: Secondary | ICD-10-CM | POA: Diagnosis present

## 2022-06-16 DIAGNOSIS — Z6834 Body mass index (BMI) 34.0-34.9, adult: Secondary | ICD-10-CM

## 2022-06-16 DIAGNOSIS — Z20822 Contact with and (suspected) exposure to covid-19: Secondary | ICD-10-CM | POA: Diagnosis present

## 2022-06-16 DIAGNOSIS — E1169 Type 2 diabetes mellitus with other specified complication: Secondary | ICD-10-CM | POA: Diagnosis present

## 2022-06-16 DIAGNOSIS — Z801 Family history of malignant neoplasm of trachea, bronchus and lung: Secondary | ICD-10-CM

## 2022-06-16 DIAGNOSIS — E860 Dehydration: Secondary | ICD-10-CM | POA: Diagnosis not present

## 2022-06-16 DIAGNOSIS — R748 Abnormal levels of other serum enzymes: Secondary | ICD-10-CM | POA: Diagnosis present

## 2022-06-16 DIAGNOSIS — Z800 Family history of malignant neoplasm of digestive organs: Secondary | ICD-10-CM

## 2022-06-16 DIAGNOSIS — Z823 Family history of stroke: Secondary | ICD-10-CM

## 2022-06-16 DIAGNOSIS — E876 Hypokalemia: Secondary | ICD-10-CM | POA: Diagnosis not present

## 2022-06-16 DIAGNOSIS — Z90710 Acquired absence of both cervix and uterus: Secondary | ICD-10-CM

## 2022-06-16 DIAGNOSIS — Z833 Family history of diabetes mellitus: Secondary | ICD-10-CM

## 2022-06-16 DIAGNOSIS — M1990 Unspecified osteoarthritis, unspecified site: Secondary | ICD-10-CM | POA: Diagnosis present

## 2022-06-16 DIAGNOSIS — K859 Acute pancreatitis without necrosis or infection, unspecified: Secondary | ICD-10-CM | POA: Diagnosis present

## 2022-06-16 DIAGNOSIS — K2970 Gastritis, unspecified, without bleeding: Secondary | ICD-10-CM | POA: Diagnosis present

## 2022-06-16 DIAGNOSIS — E1165 Type 2 diabetes mellitus with hyperglycemia: Secondary | ICD-10-CM | POA: Diagnosis present

## 2022-06-16 DIAGNOSIS — E872 Acidosis, unspecified: Secondary | ICD-10-CM | POA: Diagnosis present

## 2022-06-16 DIAGNOSIS — Z87891 Personal history of nicotine dependence: Secondary | ICD-10-CM

## 2022-06-16 DIAGNOSIS — N179 Acute kidney failure, unspecified: Secondary | ICD-10-CM | POA: Diagnosis present

## 2022-06-16 LAB — COMPREHENSIVE METABOLIC PANEL
ALT: 15 U/L (ref 0–44)
AST: 14 U/L — ABNORMAL LOW (ref 15–41)
Albumin: 4.2 g/dL (ref 3.5–5.0)
Alkaline Phosphatase: 73 U/L (ref 38–126)
Anion gap: 13 (ref 5–15)
BUN: 29 mg/dL — ABNORMAL HIGH (ref 6–20)
CO2: 22 mmol/L (ref 22–32)
Calcium: 9.5 mg/dL (ref 8.9–10.3)
Chloride: 92 mmol/L — ABNORMAL LOW (ref 98–111)
Creatinine, Ser: 1.54 mg/dL — ABNORMAL HIGH (ref 0.44–1.00)
GFR, Estimated: 39 mL/min — ABNORMAL LOW (ref >60)
Glucose, Bld: 381 mg/dL — ABNORMAL HIGH (ref 70–99)
Potassium: 4.3 mmol/L (ref 3.5–5.1)
Sodium: 127 mmol/L — ABNORMAL LOW (ref 135–145)
Total Bilirubin: 0.6 mg/dL (ref 0.3–1.2)
Total Protein: 9.7 g/dL — ABNORMAL HIGH (ref 6.5–8.1)

## 2022-06-16 LAB — CBC
HCT: 43 % (ref 36.0–46.0)
Hemoglobin: 13.8 g/dL (ref 12.0–15.0)
MCH: 21.6 pg — ABNORMAL LOW (ref 26.0–34.0)
MCHC: 32.1 g/dL (ref 30.0–36.0)
MCV: 67.4 fL — ABNORMAL LOW (ref 80.0–100.0)
Platelets: 325 10*3/uL (ref 150–400)
RBC: 6.38 MIL/uL — ABNORMAL HIGH (ref 3.87–5.11)
RDW: 18.6 % — ABNORMAL HIGH (ref 11.5–15.5)
WBC: 21.7 10*3/uL — ABNORMAL HIGH (ref 4.0–10.5)
nRBC: 0 % (ref 0.0–0.2)

## 2022-06-16 LAB — RESP PANEL BY RT-PCR (FLU A&B, COVID) ARPGX2
Influenza A by PCR: NEGATIVE
Influenza B by PCR: NEGATIVE
SARS Coronavirus 2 by RT PCR: NEGATIVE

## 2022-06-16 LAB — LACTIC ACID, PLASMA: Lactic Acid, Venous: 3.3 mmol/L (ref 0.5–1.9)

## 2022-06-16 LAB — LIPASE, BLOOD: Lipase: 271 U/L — ABNORMAL HIGH (ref 11–51)

## 2022-06-16 MED ORDER — METRONIDAZOLE 500 MG/100ML IV SOLN
500.0000 mg | Freq: Two times a day (BID) | INTRAVENOUS | Status: DC
  Administered 2022-06-17 – 2022-06-18 (×5): 500 mg via INTRAVENOUS
  Filled 2022-06-16 (×5): qty 100

## 2022-06-16 MED ORDER — SODIUM CHLORIDE 0.9 % IV BOLUS
1000.0000 mL | Freq: Once | INTRAVENOUS | Status: AC
  Administered 2022-06-16: 1000 mL via INTRAVENOUS

## 2022-06-16 MED ORDER — MORPHINE SULFATE (PF) 4 MG/ML IV SOLN
4.0000 mg | Freq: Once | INTRAVENOUS | Status: AC
  Administered 2022-06-16: 4 mg via INTRAVENOUS
  Filled 2022-06-16: qty 1

## 2022-06-16 MED ORDER — CIPROFLOXACIN IN D5W 400 MG/200ML IV SOLN
400.0000 mg | Freq: Once | INTRAVENOUS | Status: AC
  Administered 2022-06-16: 400 mg via INTRAVENOUS
  Filled 2022-06-16: qty 200

## 2022-06-16 MED ORDER — ONDANSETRON HCL 4 MG/2ML IJ SOLN
4.0000 mg | Freq: Once | INTRAMUSCULAR | Status: AC
  Administered 2022-06-16: 4 mg via INTRAVENOUS
  Filled 2022-06-16: qty 2

## 2022-06-16 NOTE — ED Provider Notes (Signed)
  Provider Note MRN:  [id number]  Arrival date & time: 06/17/22    ED Course and Medical Decision Making  Assumed care from Dr. Almyra Free at shift change.  Fever, abdominal pain, nausea vomiting diarrhea, dehydration, prominent leukocytosis, enteritis some CT.  Will reassess, may need admission  Still mildly tachycardic and symptomatic on reassessment, admitted to medicine.  Procedures  Final Clinical Impressions(s) / ED Diagnoses     ICD-10-CM   1. Dehydration  E86.0     2. Enteritis  K52.9       ED Discharge Orders     None       Discharge Instructions   None     Barth Kirks. Sedonia Small, [place] [email or web address]    Maudie Flakes, MD 06/17/22 [phone]

## 2022-06-16 NOTE — ED Notes (Signed)
Date and time results received: 06/16/22 1109   Test: Lactic Critical Value: 3.3  Name of Provider Notified: Hong  Orders Received? Or Actions Taken?: NA

## 2022-06-16 NOTE — ED Triage Notes (Signed)
N/v/fever since Tuesday. Pt took Zofran with no relief. Pt has not taken her temp but states she has been sweating.

## 2022-06-17 DIAGNOSIS — K2970 Gastritis, unspecified, without bleeding: Secondary | ICD-10-CM | POA: Diagnosis present

## 2022-06-17 DIAGNOSIS — E1165 Type 2 diabetes mellitus with hyperglycemia: Secondary | ICD-10-CM | POA: Diagnosis present

## 2022-06-17 DIAGNOSIS — I10 Essential (primary) hypertension: Secondary | ICD-10-CM | POA: Diagnosis present

## 2022-06-17 DIAGNOSIS — K529 Noninfective gastroenteritis and colitis, unspecified: Secondary | ICD-10-CM | POA: Diagnosis present

## 2022-06-17 DIAGNOSIS — E669 Obesity, unspecified: Secondary | ICD-10-CM | POA: Diagnosis present

## 2022-06-17 DIAGNOSIS — M1990 Unspecified osteoarthritis, unspecified site: Secondary | ICD-10-CM | POA: Diagnosis present

## 2022-06-17 DIAGNOSIS — N179 Acute kidney failure, unspecified: Secondary | ICD-10-CM

## 2022-06-17 DIAGNOSIS — E872 Acidosis, unspecified: Secondary | ICD-10-CM | POA: Diagnosis present

## 2022-06-17 DIAGNOSIS — Z833 Family history of diabetes mellitus: Secondary | ICD-10-CM | POA: Diagnosis not present

## 2022-06-17 DIAGNOSIS — Z20822 Contact with and (suspected) exposure to covid-19: Secondary | ICD-10-CM | POA: Diagnosis present

## 2022-06-17 DIAGNOSIS — R748 Abnormal levels of other serum enzymes: Secondary | ICD-10-CM

## 2022-06-17 DIAGNOSIS — Z6834 Body mass index (BMI) 34.0-34.9, adult: Secondary | ICD-10-CM | POA: Diagnosis not present

## 2022-06-17 DIAGNOSIS — R6520 Severe sepsis without septic shock: Secondary | ICD-10-CM | POA: Diagnosis present

## 2022-06-17 DIAGNOSIS — E860 Dehydration: Secondary | ICD-10-CM | POA: Diagnosis present

## 2022-06-17 DIAGNOSIS — R6510 Systemic inflammatory response syndrome (SIRS) of non-infectious origin without acute organ dysfunction: Secondary | ICD-10-CM

## 2022-06-17 DIAGNOSIS — Z823 Family history of stroke: Secondary | ICD-10-CM | POA: Diagnosis not present

## 2022-06-17 DIAGNOSIS — A419 Sepsis, unspecified organism: Secondary | ICD-10-CM | POA: Diagnosis present

## 2022-06-17 DIAGNOSIS — E1169 Type 2 diabetes mellitus with other specified complication: Secondary | ICD-10-CM | POA: Diagnosis present

## 2022-06-17 DIAGNOSIS — Z66 Do not resuscitate: Secondary | ICD-10-CM | POA: Diagnosis present

## 2022-06-17 DIAGNOSIS — Z800 Family history of malignant neoplasm of digestive organs: Secondary | ICD-10-CM | POA: Diagnosis not present

## 2022-06-17 DIAGNOSIS — K859 Acute pancreatitis without necrosis or infection, unspecified: Secondary | ICD-10-CM | POA: Diagnosis present

## 2022-06-17 DIAGNOSIS — Z90710 Acquired absence of both cervix and uterus: Secondary | ICD-10-CM | POA: Diagnosis not present

## 2022-06-17 DIAGNOSIS — E876 Hypokalemia: Secondary | ICD-10-CM | POA: Diagnosis not present

## 2022-06-17 DIAGNOSIS — Z87891 Personal history of nicotine dependence: Secondary | ICD-10-CM | POA: Diagnosis not present

## 2022-06-17 DIAGNOSIS — Z801 Family history of malignant neoplasm of trachea, bronchus and lung: Secondary | ICD-10-CM | POA: Diagnosis not present

## 2022-06-17 LAB — CBC WITH DIFFERENTIAL/PLATELET
Abs Immature Granulocytes: 0.08 10*3/uL — ABNORMAL HIGH (ref 0.00–0.07)
Basophils Absolute: 0 10*3/uL (ref 0.0–0.1)
Basophils Relative: 0 %
Eosinophils Absolute: 0 10*3/uL (ref 0.0–0.5)
Eosinophils Relative: 0 %
HCT: 41.1 % (ref 36.0–46.0)
Hemoglobin: 13 g/dL (ref 12.0–15.0)
Immature Granulocytes: 0 %
Lymphocytes Relative: 19 %
Lymphs Abs: 3.5 10*3/uL (ref 0.7–4.0)
MCH: 21.7 pg — ABNORMAL LOW (ref 26.0–34.0)
MCHC: 31.6 g/dL (ref 30.0–36.0)
MCV: 68.7 fL — ABNORMAL LOW (ref 80.0–100.0)
Monocytes Absolute: 1.2 10*3/uL — ABNORMAL HIGH (ref 0.1–1.0)
Monocytes Relative: 7 %
Neutro Abs: 13.7 10*3/uL — ABNORMAL HIGH (ref 1.7–7.7)
Neutrophils Relative %: 74 %
Platelets: 274 10*3/uL (ref 150–400)
RBC: 5.98 MIL/uL — ABNORMAL HIGH (ref 3.87–5.11)
RDW: 18 % — ABNORMAL HIGH (ref 11.5–15.5)
WBC: 18.5 10*3/uL — ABNORMAL HIGH (ref 4.0–10.5)
nRBC: 0 % (ref 0.0–0.2)

## 2022-06-17 LAB — GLUCOSE, CAPILLARY
Glucose-Capillary: 319 mg/dL — ABNORMAL HIGH (ref 70–99)
Glucose-Capillary: 239 mg/dL — ABNORMAL HIGH (ref 70–99)
Glucose-Capillary: 100 mg/dL — ABNORMAL HIGH (ref 70–99)
Glucose-Capillary: 156 mg/dL — ABNORMAL HIGH (ref 70–99)

## 2022-06-17 LAB — COMPREHENSIVE METABOLIC PANEL
ALT: 11 U/L (ref 0–44)
AST: 9 U/L — ABNORMAL LOW (ref 15–41)
Albumin: 3.5 g/dL (ref 3.5–5.0)
Alkaline Phosphatase: 61 U/L (ref 38–126)
Anion gap: 10 (ref 5–15)
BUN: 26 mg/dL — ABNORMAL HIGH (ref 6–20)
CO2: 23 mmol/L (ref 22–32)
Calcium: 8.6 mg/dL — ABNORMAL LOW (ref 8.9–10.3)
Chloride: 98 mmol/L (ref 98–111)
Creatinine, Ser: 1.11 mg/dL — ABNORMAL HIGH (ref 0.44–1.00)
GFR, Estimated: 58 mL/min — ABNORMAL LOW (ref >60)
Glucose, Bld: 302 mg/dL — ABNORMAL HIGH (ref 70–99)
Potassium: 3.9 mmol/L (ref 3.5–5.1)
Sodium: 131 mmol/L — ABNORMAL LOW (ref 135–145)
Total Bilirubin: 0.4 mg/dL (ref 0.3–1.2)
Total Protein: 7.9 g/dL (ref 6.5–8.1)

## 2022-06-17 LAB — LACTIC ACID, PLASMA
Lactic Acid, Venous: 2.3 mmol/L (ref 0.5–1.9)
Lactic Acid, Venous: 1.3 mmol/L (ref 0.5–1.9)
Lactic Acid, Venous: 2.3 mmol/L (ref 0.5–1.9)

## 2022-06-17 LAB — URINALYSIS, ROUTINE W REFLEX MICROSCOPIC
Bacteria, UA: NONE SEEN
Bilirubin Urine: NEGATIVE
Glucose, UA: 150 mg/dL — AB
Hgb urine dipstick: NEGATIVE
Ketones, ur: NEGATIVE mg/dL
Leukocytes,Ua: NEGATIVE
Nitrite: NEGATIVE
Protein, ur: 100 mg/dL — AB
Specific Gravity, Urine: 1.025 (ref 1.005–1.030)
pH: 5 (ref 5.0–8.0)

## 2022-06-17 LAB — HIV ANTIBODY (ROUTINE TESTING W REFLEX): HIV Screen 4th Generation wRfx: NONREACTIVE

## 2022-06-17 LAB — PROTIME-INR
INR: 1.3 — ABNORMAL HIGH (ref 0.8–1.2)
Prothrombin Time: 15.6 seconds — ABNORMAL HIGH (ref 11.4–15.2)

## 2022-06-17 LAB — PROCALCITONIN
Procalcitonin: 0.45 ng/mL
Procalcitonin: 0.23 ng/mL

## 2022-06-17 LAB — CORTISOL-AM, BLOOD: Cortisol - AM: 25.7 ug/dL — ABNORMAL HIGH (ref 6.7–22.6)

## 2022-06-17 LAB — MAGNESIUM: Magnesium: 1.7 mg/dL (ref 1.7–2.4)

## 2022-06-17 MED ORDER — SODIUM CHLORIDE 0.9 % IV SOLN: INTRAVENOUS | Status: DC

## 2022-06-17 MED ORDER — APIXABAN 5 MG PO TABS
5.0000 mg | ORAL_TABLET | Freq: Two times a day (BID) | ORAL | Status: DC
  Filled 2022-06-17: qty 1

## 2022-06-17 MED ORDER — ACETAMINOPHEN 325 MG PO TABS: 650.0000 mg | ORAL_TABLET | Freq: Four times a day (QID) | ORAL | Status: DC | PRN

## 2022-06-17 MED ORDER — INSULIN DETEMIR 100 UNIT/ML ~~LOC~~ SOLN
10.0000 [IU] | Freq: Every day | SUBCUTANEOUS | Status: DC
  Administered 2022-06-18 – 2022-06-20 (×3): 10 [IU] via SUBCUTANEOUS
  Filled 2022-06-17 (×5): qty 0.1

## 2022-06-17 MED ORDER — INSULIN ASPART 100 UNIT/ML IJ SOLN
0.0000 [IU] | Freq: Three times a day (TID) | INTRAMUSCULAR | Status: DC
  Administered 2022-06-17 (×2): 11 [IU] via SUBCUTANEOUS
  Administered 2022-06-18 (×2): 3 [IU] via SUBCUTANEOUS
  Administered 2022-06-18 – 2022-06-19 (×2): 2 [IU] via SUBCUTANEOUS
  Administered 2022-06-19: 3 [IU] via SUBCUTANEOUS

## 2022-06-17 MED ORDER — ACETAMINOPHEN 650 MG RE SUPP: 650.0000 mg | Freq: Four times a day (QID) | RECTAL | Status: DC | PRN

## 2022-06-17 MED ORDER — ENOXAPARIN SODIUM 40 MG/0.4ML IJ SOSY
40.0000 mg | PREFILLED_SYRINGE | INTRAMUSCULAR | Status: DC
Start: 2022-06-17 — End: 2022-06-20
  Administered 2022-06-17 – 2022-06-20 (×4): 40 mg via SUBCUTANEOUS
  Filled 2022-06-17 (×4): qty 0.4

## 2022-06-17 MED ORDER — ATORVASTATIN CALCIUM 10 MG PO TABS
10.0000 mg | ORAL_TABLET | Freq: Every day | ORAL | Status: DC
  Administered 2022-06-17 – 2022-06-20 (×4): 10 mg via ORAL
  Filled 2022-06-17 (×4): qty 1

## 2022-06-17 MED ORDER — INSULIN ASPART 100 UNIT/ML IJ SOLN: 0.0000 [IU] | Freq: Every day | INTRAMUSCULAR | Status: DC

## 2022-06-17 MED ORDER — SODIUM CHLORIDE 0.9 % IV SOLN
2.0000 g | INTRAVENOUS | Status: DC
  Administered 2022-06-17 – 2022-06-20 (×4): 2 g via INTRAVENOUS
  Filled 2022-06-17 (×4): qty 20

## 2022-06-17 MED ORDER — ONDANSETRON HCL 4 MG/2ML IJ SOLN
4.0000 mg | Freq: Four times a day (QID) | INTRAMUSCULAR | Status: DC | PRN
  Administered 2022-06-17: 4 mg via INTRAVENOUS
  Filled 2022-06-17: qty 2

## 2022-06-17 MED ORDER — RISAQUAD PO CAPS
1.0000 | ORAL_CAPSULE | Freq: Three times a day (TID) | ORAL | Status: DC
  Administered 2022-06-17 – 2022-06-20 (×9): 1 via ORAL
  Filled 2022-06-17 (×9): qty 1

## 2022-06-17 MED ORDER — ONDANSETRON HCL 4 MG PO TABS: 4.0000 mg | ORAL_TABLET | Freq: Four times a day (QID) | ORAL | Status: DC | PRN

## 2022-06-17 MED ORDER — OXYCODONE HCL 5 MG PO TABS
5.0000 mg | ORAL_TABLET | ORAL | Status: DC | PRN
  Administered 2022-06-17 – 2022-06-19 (×8): 5 mg via ORAL
  Filled 2022-06-17 (×8): qty 1

## 2022-06-17 NOTE — Assessment & Plan Note (Signed)
-  Resolved  - Met sepsis criteria on admission: Tachycardia, tachypnea,, lactic acidosis, leukocytosis, AKI -Likely source of infection gastritis  -WBC 21.7 >>> 18.5 >> 14.9>>> 10.8  -Lactic 3.3 >> 2.3,  -Cr 1.54 >> 1.11>> 0.79, 0.68 -Blood cultures >> no growth to date -Flu, and covid negative -most likely 2/2 enteritis -Procal pending -CXR = no active disease -UA -reviewed no signs of infection -was on IV Rocephin and flagyl >>> changed tp PO Cipro and Flagyl  -Advancing diet as tolerated

## 2022-06-17 NOTE — Progress Notes (Signed)
PROGRESS NOTE    Patient: Shawna Huerta                            PCP: Renee Rival, FNP                    DOB: November 16, 1961            DOA: 06/16/2022 ZCH:[id number]             DOS: 06/17/2022, 11:23 AM   LOS: 0 days   Date of Service: The patient was seen and examined on 06/17/2022  Subjective:   The patient was seen and examined this morning. Tachycardic with a heart rate of 109, otherwise afebrile, mildly hypertensive satting 100% on room air Denies any chest pain or shortness of breath  Brief Narrative:   Shawna Huerta is a 61 y.o. female with medical history significant of anemia, arthritis, diabetes mellitus type 2, hypertension, history of smoking, pancreatitis, and more presents the ED with a chief complaint of nausea and vomiting for 3 days.   Patient reports she has had nausea and vomiting anytime she has any p.o. intake.  She has had no hematemesis or coffee-ground emesis.  She does report palpitations, flushing, and diaphoresis.  She denies any chest pain.  Patient reports she has had diarrhea for 2 days.  Her last meal was Monday evening.  She has no sick contacts that she knows of, but she has been in the hospital visiting her sister who had a stroke.  Patient reports subjective fever and chills.  She has had no blood in her stool.  When she did have diarrhea that was 4 times during the day.  It has slowed down since she has not been eating.  She reports when she vomits it looks like green or yellow color.  She has left lower quadrant pain that feels like a constant ache.  Is also worse with p.o. intake.  Positional changes make her pain worse, rest makes it better.  Patient denies any dysuria or hematuria.  Patient has no other complaints at this time.  ED Temp 97.8, heart rate 98-143, respiratory rate 13-21, blood pressure 118/81-179/100, satting at 97% Leukocytosis 21.7, hemoglobin 13.8, platelets 325 Sodium slightly low at 127, BUN 29, creatinine 1.54 Hyperglycemic  glucose of 381 Chest x-ray shows no active disease CT abdomen pelvis shows nonspecific wall thickening of the duodenum and proximal jejunum which could reflect underlying enteritis. Patient also has an elevated lipase at 271-possible that pancreatitis is also contributing Blood culture pending, lactic acid three-point 3 repeat pending Flu and COVID-negative EKG shows a heart rate of 141, sinus tach with premature complexes Concern for viral versus bacterial enteritis, continue Rocephin and Flagyl Admission requested for enteritis that caused dehydration and AKI    Assessment & Plan:   Principal Problem:   Sepsis ([place]) Active Problems:   Enteritis   AKI (acute kidney injury) ([place])   Dehydration   Abnormal serum level of lipase   Type 2 diabetes mellitus with other specified complication ([place])   Essential hypertension     Assessment and Plan: * Sepsis ([place]) - Met sepsis criteria on admission: Tachycardia, tachypnea,, lactic acidosis, leukocytosis, AKI -Likely source of infection gastritis  -WBC 21.7 >>> 18.5 -Lactic 3.3 >> 2.3 -Cr 1.54 >> 1.11 -Blood cultures >>  -Flu, and covid negative -most likely 2/2 enteritis -Procal pending -CXR = no active disease -UA pending -Continue  Rocephin and flagyl -was NPO --- will start clear liquid diet  Enteritis -bacterial vs viral -Met sepsis criteria -CT abdomen/Pelvis - shows underlying enteritis. No SBO or ileus.  -Supportive care with zofran and fluids -Continue maintenance fluids -Cipro flagyl in ED, continue antibiotics -Improving leukocytosis 21.7, 18.5,, lactic acid 3.3, 3.2, procalcitonin 0.23, -Continue to monitor  SIRS (systemic inflammatory response syndrome) (HCC)-resolved as of 06/17/2022 -HR 143, R 21, WBC 21.7 -Lactic 3.3 -Cr 1.54 -blood cultures pending -Flu, and covid negative -most likely 2/2 enteritis -Procal pending -CXR = no active disease -UA pending -continue Rocephin and flagyl -NPO  Abnormal  serum level of lipase -lipase 271 -Epigastric pain -CT abd/pel shows possible enteritis, but possibly could be underlying pancreatitis, NPO -pain control -Continue IV fluids  Dehydration -2/2 GI losses -Lactic acidosis, AKI -2L NS bolus in ED -Continue IV infusion overnight -Monitor clinical response  AKI (acute kidney injury) (H[facility]) -Improving -Cr 0.95 >>> 1.54 >> 1.11 -Hold nephrotoxic agents including lisinopril -2L NS bolus in ED -Continue NS IV infusion -Monitoring kidney function closely  Essential hypertension -Holding lisinopril in the setting of AKI  Type 2 diabetes mellitus with other specified complication (HCC) -Hold glyburide and metformin -Last A1C 8.3 CBG (last 3)  Recent Labs    06/17/22 0812  GLUCAP 319*    -Sliding scale coverage -NPO >> starting clear liquid diet -Continue to monitor     ----------------------------------------------------------------------------------------------------------------------------------------- Cultures; Blood Cultures x 2 >> Urine Culture  >>>   -----------------------------------------------------------------------------------------------------------------------------------------  DVT prophylaxis:  enoxaparin (LOVENOX) injection 40 mg Start: 06/17/22 1100 SCDs Start: 06/17/22 0451   Code Status:   Code Status: DNR  Family Communication: No family member present at bedside- attempt will be made to update daily The above findings and plan of care has been discussed with patient (and family)  in detail,  they expressed understanding and agreement of above. -Advance care planning has been discussed.   Admission status:   Status is: Inpatient Remains inpatient appropriate because: Continued need for IV fluid, IV antibiotics, treating sepsis     Procedures:   No admission procedures for hospital encounter.   Antimicrobials:  Anti-infectives (From admission, onward)    Start     Dose/Rate Route  Frequency Ordered Stop   06/17/22 1000  cefTRIAXone (ROCEPHIN) 2 g in sodium chloride 0.9 % 100 mL IVPB        2 g 200 mL/hr over 30 Minutes Intravenous Every 24 hours 06/17/22 0450 06/24/22 0959   06/16/22 2345  ciprofloxacin (CIPRO) IVPB 400 mg        400 mg 200 mL/hr over 60 Minutes Intravenous  Once 06/16/22 2330 06/17/22 0111   06/16/22 2345  metroNIDAZOLE (FLAGYL) IVPB 500 mg        500 mg 100 mL/hr over 60 Minutes Intravenous Every 12 hours 06/16/22 2330          Medication:   acidophilus  1 capsule Oral TID WC   atorvastatin  10 mg Oral Daily   enoxaparin (LOVENOX) injection  40 mg Subcutaneous Q24H   insulin aspart  0-15 Units Subcutaneous TID WC   insulin aspart  0-5 Units Subcutaneous QHS    acetaminophen **OR** acetaminophen, ondansetron **OR** ondansetron (ZOFRAN) IV, oxyCODONE   Objective:   Vitals:   06/17/22 0030 06/17/22 0410 06/17/22 0440 06/17/22 0614  BP: (!) 179/94 (!) 153/89 (!) 142/82   Pulse: (!) 101 100 (!) 109   Resp: '19 20 18 18  ' Temp:   98.1 F (36.7 C)  TempSrc:      SpO2: 97% 98% 100%   Weight:      Height:        Intake/Output Summary (Last 24 hours) at 06/17/2022 1123 Last data filed at 06/17/2022 0340 Gross per 24 hour  Intake 2291.17 ml  Output --  Net 2291.17 ml   Filed Weights   06/16/22 2125  Weight: 99.8 kg     Examination:   Physical Exam  Constitution:  Alert, cooperative, no distress,  Appears calm and comfortable  Psychiatric:   Normal and stable mood and affect, cognition intact,   HEENT:        Normocephalic, PERRL, otherwise with in Normal limits  Chest:         Chest symmetric Cardio vascular:  S1/S2, RRR, No murmure, No Rubs or Gallops  pulmonary: Clear to auscultation bilaterally, respirations unlabored, negative wheezes / crackles Abdomen: Soft, non-tender, non-distended, bowel sounds,no masses, no organomegaly Muscular skeletal: Limited exam - in bed, able to move all 4 extremities,   Neuro: CNII-XII  intact. , normal motor and sensation, reflexes intact  Extremities: No pitting edema lower extremities, +2 pulses  Skin: Dry, warm to touch, negative for any Rashes, No open wounds Wounds: per nursing documentation   ------------------------------------------------------------------------------------------------------------------------------------------    LABs:     Latest Ref Rng & Units 06/17/2022    5:45 AM 06/16/2022   10:05 PM 05/01/2022    5:33 PM  CBC  WBC 4.0 - 10.5 K/uL 18.5  21.7  16.7   Hemoglobin 12.0 - 15.0 g/dL 13.0  13.8  13.2   Hematocrit 36.0 - 46.0 % 41.1  43.0  41.3   Platelets 150 - 400 K/uL 274  325  293       Latest Ref Rng & Units 06/17/2022    5:45 AM 06/16/2022   10:05 PM 05/01/2022    5:33 PM  CMP  Glucose 70 - 99 mg/dL 302  381  434   BUN 6 - 20 mg/dL '26  29  19   ' Creatinine 0.44 - 1.00 mg/dL 1.11  1.54  0.95   Sodium 135 - 145 mmol/L 131  127  127   Potassium 3.5 - 5.1 mmol/L 3.9  4.3  3.9   Chloride 98 - 111 mmol/L 98  92  93   CO2 22 - 32 mmol/L '23  22  25   ' Calcium 8.9 - 10.3 mg/dL 8.6  9.5  9.3   Total Protein 6.5 - 8.1 g/dL 7.9  9.7  9.3   Total Bilirubin 0.3 - 1.2 mg/dL 0.4  0.6  0.5   Alkaline Phos 38 - 126 U/L 61  73  71   AST 15 - 41 U/L '9  14  13   ' ALT 0 - 44 U/L '11  15  15        ' Micro Results Recent Results (from the past 240 hour(s))  Resp Panel by RT-PCR (Flu A&B, Covid) Anterior Nasal Swab     Status: None   Collection Time: 06/16/22 10:16 PM   Specimen: Anterior Nasal Swab  Result Value Ref Range Status   SARS Coronavirus 2 by RT PCR NEGATIVE NEGATIVE Final    Comment: (NOTE) SARS-CoV-2 target nucleic acids are NOT DETECTED.  The SARS-CoV-2 RNA is generally detectable in upper respiratory specimens during the acute phase of infection. The lowest concentration of SARS-CoV-2 viral copies this assay can detect is 138 copies/mL. A negative result does not preclude SARS-Cov-2 infection and should not  be used as the sole basis  for treatment or other patient management decisions. A negative result may occur with  improper specimen collection/handling, submission of specimen other than nasopharyngeal swab, presence of viral mutation(s) within the areas targeted by this assay, and inadequate number of viral copies(<138 copies/mL). A negative result must be combined with clinical observations, patient history, and epidemiological information. The expected result is Negative.  Fact Sheet for Patients:  EntrepreneurPulse.com.au  Fact Sheet for Healthcare Providers:  IncredibleEmployment.be  This test is no t yet approved or cleared by the [place] FDA and  has been authorized for detection and/or diagnosis of SARS-CoV-2 by FDA under an Emergency Use Authorization (EUA). This EUA will remain  in effect (meaning this test can be used) for the duration of the COVID-19 declaration under Section 564(b)(1) of the Act, 21 U.S.C.section 360bbb-3(b)(1), unless the authorization is terminated  or revoked sooner.       Influenza A by PCR NEGATIVE NEGATIVE Final   Influenza B by PCR NEGATIVE NEGATIVE Final    Comment: (NOTE) The Xpert Xpress SARS-CoV-2/FLU/RSV plus assay is intended as an aid in the diagnosis of influenza from Nasopharyngeal swab specimens and should not be used as a sole basis for treatment. Nasal washings and aspirates are unacceptable for Xpert Xpress SARS-CoV-2/FLU/RSV testing.  Fact Sheet for Patients: EntrepreneurPulse.com.au  Fact Sheet for Healthcare Providers: IncredibleEmployment.be  This test is not yet approved or cleared by the [place] FDA and has been authorized for detection and/or diagnosis of SARS-CoV-2 by FDA under an Emergency Use Authorization (EUA). This EUA will remain in effect (meaning this test can be used) for the duration of the COVID-19 declaration under Section 564(b)(1) of the Act, 21  U.S.C. section 360bbb-3(b)(1), unless the authorization is terminated or revoked.  Performed at [facility], [address] [place], [place] [address]   Culture, blood (routine x 2)     Status: None (Preliminary result)   Collection Time: 06/16/22 10:27 PM   Specimen: BLOOD  Result Value Ref Range Status   Specimen Description BLOOD BLOOD LEFT ARM  Final   Special Requests   Final    BOTTLES DRAWN AEROBIC AND ANAEROBIC Blood Culture adequate volume   Culture   Final    NO GROWTH < 12 HOURS Performed at [facility], [address], [place], [place] [address]    Report Status PENDING  Incomplete  Culture, blood (routine x 2)     Status: None (Preliminary result)   Collection Time: 06/16/22 10:31 PM   Specimen: BLOOD  Result Value Ref Range Status   Specimen Description BLOOD BLOOD LEFT HAND  Final   Special Requests   Final    BOTTLES DRAWN AEROBIC AND ANAEROBIC Blood Culture adequate volume   Culture   Final    NO GROWTH < 12 HOURS Performed at [facility], [address] [place], [place] [address]    Report Status PENDING  Incomplete    Radiology Reports CT Abdomen Pelvis Wo Contrast  Result Date: 06/16/2022 CLINICAL DATA:  Nausea and vomiting, abdominal pain EXAM: CT ABDOMEN AND PELVIS WITHOUT CONTRAST TECHNIQUE: Multidetector CT imaging of the abdomen and pelvis was performed following the standard protocol without IV contrast. RADIATION DOSE REDUCTION: This exam was performed according to the departmental dose-optimization program which includes automated exposure control, adjustment of the mA and/or kV according to patient size and/or use of iterative reconstruction technique. COMPARISON:  05/01/2022 FINDINGS: Lower chest: No acute pleural or parenchymal lung disease. Hepatobiliary:  Cholecystectomy. Unremarkable unenhanced appearance of the liver. Pancreas: Unremarkable unenhanced appearance. Spleen: Unremarkable unenhanced appearance. Adrenals/Urinary Tract: Punctate less  than 2 mm nonobstructing left renal calculus. No right-sided calculi. No obstructive uropathy within either kidney. Bladder is decompressed, limiting its evaluation. Adrenals are unremarkable. Stomach/Bowel: No bowel obstruction or ileus. Normal appendix right lower quadrant. Apparent mild wall thickening of the duodenum and proximal jejunum which could reflect underlying enteritis. No inflammatory changes. Vascular/Lymphatic: No significant vascular findings are present. No enlarged abdominal or pelvic lymph nodes. Reproductive: Status post hysterectomy. No adnexal masses. Other: No free fluid or free intraperitoneal gas. No abdominal wall hernia. Musculoskeletal: No acute or destructive bony lesions. Reconstructed images demonstrate no additional findings. IMPRESSION: 1. Nonspecific wall thickening of the duodenum and proximal jejunum, which could reflect underlying enteritis. Number a shin limited without intravenous and oral contrast. 2. No bowel obstruction or ileus. 3. Punctate less than 2 mm nonobstructing left renal calculus. Electronically Signed   By: Randa Ngo M.D.   On: 06/16/2022 23:26   DG Chest Port 1 View  Result Date: 06/16/2022 CLINICAL DATA:  Fever, nausea and vomiting for 3 days EXAM: PORTABLE CHEST 1 VIEW COMPARISON:  10/10/2015 FINDINGS: The heart size and mediastinal contours are within normal limits. Both lungs are clear. The visualized skeletal structures are unremarkable. IMPRESSION: No active disease. Electronically Signed   By: Randa Ngo M.D.   On: 06/16/2022 22:45    SIGNED: Deatra James, MD, FHM. [facility],  Pager (please use [email or web address] to page/text) Please use Epic Secure Chat for non-urgent communication (7AM-7PM)  If 7PM-7AM, please contact night-coverage [email or web address], 06/17/2022, 11:23 AM

## 2022-06-17 NOTE — Assessment & Plan Note (Signed)
-  HR 143, R 21, WBC 21.7 -Lactic 3.3 -Cr 1.54 -blood cultures pending -Flu, and covid negative -most likely 2/2 enteritis -Procal pending -CXR = no active disease -UA pending -continue Rocephin and flagyl -NPO

## 2022-06-17 NOTE — Assessment & Plan Note (Addendum)
-  bacterial vs viral -Met sepsis criteria -CT abdomen/Pelvis - shows underlying enteritis. No SBO or ileus.  -Supportive care with zofran and fluids -Continue maintenance fluids -Cipro flagyl in ED, continue antibiotics -Improving leukocytosis 21.7, 18.5,, lactic acid 3.3, 3.2, procalcitonin 0.23, -Continue to monitor

## 2022-06-17 NOTE — Progress Notes (Signed)
Date and time results received: 06/17/22 1050 (use smartphrase ".now" to insert current time)  Test: Lactic Acid Critical Value: 2.3  Name of Provider Notified: Shahmehdi  No new orders at this time

## 2022-06-17 NOTE — Progress Notes (Signed)
Initial Nutrition Assessment  DOCUMENTATION CODES:   Obesity unspecified  INTERVENTION:  Follow diet advancement   Request current weight  Recommend Vitamin D 4,000 IU daily  NUTRITION DIAGNOSIS:   Inadequate oral intake related to acute illness (enteritis) as evidenced by percent weight loss, per patient/family report, energy intake < or equal to 50% for > or equal to 5 days.   GOAL:  Patient will meet greater than or equal to 90% of their needs   MONITOR:  Diet advancement, Weight trends  REASON FOR ASSESSMENT:   Malnutrition Screening Tool    ASSESSMENT: Patient is a 61 yo female with hx of DM2, HTN, Anemia, pancreatitis.  Enteritis, SIRS, sepsis, AKI, Dehydration per MD.  Patient NPO with ice chips. Not feeling well during RD visit. Patient last meal was on Monday. Home diet is consistent cho. Patient eats 3 meals daily and hs snack. 7/11- A1C-8.3% and Vitamin D- 5.9 (L).    Patient weight trending down since March when she weighed 116.1 kg. Currently 99.8 kg. A loss of 14% (16) kg x 4 months. Request re-weight to verify.   At risk for malnutrition.   Medications: levemir, novolog.  IVF-NS@ 125 ml/hr  Lactic acid 2.3. WBC-18.5     Latest Ref Rng & Units 06/17/2022    5:45 AM 06/16/2022   10:05 PM 05/01/2022    5:33 PM  BMP  Glucose 70 - 99 mg/dL 302  381  434   BUN 6 - 20 mg/dL '26  29  19   '$ Creatinine 0.44 - 1.00 mg/dL 1.11  1.54  0.95   Sodium 135 - 145 mmol/L 131  127  127   Potassium 3.5 - 5.1 mmol/L 3.9  4.3  3.9   Chloride 98 - 111 mmol/L 98  92  93   CO2 22 - 32 mmol/L '23  22  25   '$ Calcium 8.9 - 10.3 mg/dL 8.6  9.5  9.3       NUTRITION - FOCUSED PHYSICAL EXAM: Unable to complete Nutrition-Focused physical exam at this time.     Diet Order:   Diet Order             Diet NPO time specified Except for: Ice Chips, Sips with Meds  Diet effective now                   EDUCATION NEEDS:  Not appropriate for education at this time  Skin:   Skin Assessment: Reviewed RN Assessment  Last BM:  7/18  Height:   Ht Readings from Last 1 Encounters:  06/16/22 '5\' 7"'$  (1.702 m)    Weight:   Wt Readings from Last 1 Encounters:  06/16/22 99.8 kg    Ideal Body Weight:   61 kg  BMI:  Body mass index is 34.46 kg/m.  Estimated Nutritional Needs:   Kcal:  1900-2000  Protein:  97-103 gr  Fluid:  >1800 ml daily  Colman Cater MS,RD,CSG,LDN Contact: Shea Evans

## 2022-06-17 NOTE — Assessment & Plan Note (Addendum)
-  lipase 271 -Epigastric pain -CT abd/pel shows possible enteritis, but possibly could be underlying pancreatitis, NPO -pain control -Continue IV fluids

## 2022-06-17 NOTE — Assessment & Plan Note (Addendum)
-  Improving -Cr 0.95 >>> 1.54 >> 1.11, 0.79, 0.68 -Resume  lisinopril -2L NS bolus in ED -Continue NS IV infusion

## 2022-06-17 NOTE — TOC Initial Note (Signed)
Transition of Care [facility]) - Initial/Assessment Note    Patient Details  Name: Shawna Huerta MRN: [id number] Date of Birth: 06/19/1961  Transition of Care [facility]) CM/SW Contact:    Ihor Gully, LCSW Phone Number: 06/17/2022, 3:16 PM  Clinical Narrative:                 Patient from home alone. Admitted for sepsis. Independent at baseline. Drives. No needs currently.   Expected Discharge Plan: Home/Self Care Barriers to Discharge: Continued Medical Work up   Patient Goals and CMS Choice Patient states their goals for this hospitalization and ongoing recovery are:: return home      Expected Discharge Plan and Services Expected Discharge Plan: Home/Self Care     Post Acute Care Choice: Durable Medical Equipment Living arrangements for the past 2 months: Single Family Home                                      Prior Living Arrangements/Services Living arrangements for the past 2 months: Single Family Home Lives with:: Self Patient language and need for interpreter reviewed:: No        Need for Family Participation in Patient Care: No (Comment) Care giver support system in place?: No (comment)   Criminal Activity/Legal Involvement Pertinent to Current Situation/Hospitalization: No - Comment as needed  Activities of Daily Living Home Assistive Devices/Equipment: None ADL Screening (condition at time of admission) Patient's cognitive ability adequate to safely complete daily activities?: Yes Is the patient deaf or have difficulty hearing?: No Does the patient have difficulty seeing, even when wearing glasses/contacts?: No Does the patient have difficulty concentrating, remembering, or making decisions?: No Patient able to express need for assistance with ADLs?: Yes Does the patient have difficulty dressing or bathing?: No Independently performs ADLs?: Yes (appropriate for developmental age) Does the patient have difficulty walking or climbing stairs?: No Weakness  of Legs: None Weakness of Arms/Hands: None  Permission Sought/Granted                  Emotional Assessment     Affect (typically observed): Appropriate Orientation: : Oriented to Self, Oriented to Place, Oriented to  Time, Oriented to Situation Alcohol / Substance Use: Not Applicable Psych Involvement: No (comment)  Admission diagnosis:  Dehydration [E86.0] Enteritis [K52.9] SIRS (systemic inflammatory response syndrome) ([place]) [R65.10] Patient Active Problem List   Diagnosis Date Noted   Dehydration 06/17/2022   Abnormal serum level of lipase 06/17/2022   Sepsis ([place]) 06/17/2022    Class: Acute   Annual physical exam 06/07/2022   Hyperlipidemia LDL goal <70 04/09/2022   Microcytic anemia 02/17/2022   Need for immunization against influenza 02/17/2022   Left breast mass 02/17/2022   Marijuana smoker 02/17/2022   Left leg DVT ([place]) 11/12/2021   ARF (acute renal failure) ([place]) 07/05/2019   Abdominal pain 07/05/2019   Enteritis 07/05/2019   Colon adenomas 12/28/2016   Normocytic anemia 12/28/2016   Postviral gastroparesis    Type 2 diabetes mellitus ([place]) 10/23/2016   AKI (acute kidney injury) ([place]) 10/23/2016   IBS (irritable bowel syndrome) 10/12/2015   Acute pancreatitis 10/10/2015   Diarrhea 10/10/2015   Non-intractable vomiting with nausea 10/10/2015   Type 2 diabetes mellitus with other specified complication ([place]) 63/84/6659   Labial abscess 10/10/2015   Hyponatremia 10/10/2015   Essential hypertension 10/10/2015   Tobacco abuse 10/10/2015   Bartholin cyst 07/03/2014  Uncontrolled type 2 diabetes mellitus with hyperglycemia, without long-term current use of insulin ([place]) 09/03/2013   Obesity 09/03/2013   Vaginal itching 09/03/2013   Nicotine addiction 09/03/2013   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   TENOSYNOVITIS OF FOOT AND ANKLE 09/22/2010   FOOT PAIN, BILATERAL 09/22/2010   PCP:  Renee Rival, FNP Pharmacy:   [facility]  [place], [place] AT [place] [address] [place] [address] Phone: [phone] Fax: [phone]  [facility] [phone] - [place], [place] AT [address] [place] [address] Phone: [phone] Fax: [place], [place] AT [place] [place] [address] Phone: [phone] Fax: [phone]     Social Determinants of Health (SDOH) Interventions    Readmission Risk Interventions     No data to display

## 2022-06-17 NOTE — H&P (Signed)
History and Physical    Patient: Shawna Huerta NID:[id number] DOB: 02-Dec-1960 DOA: 06/16/2022 DOS: the patient was seen and examined on 06/17/2022 PCP: Renee Rival, FNP  Patient coming from: Home  Chief Complaint:  Chief Complaint  Patient presents with   Nausea   Fever   Emesis   HPI: Shawna Huerta is a 61 y.o. female with medical history significant of anemia, arthritis, diabetes mellitus type 2, hypertension, history of smoking, pancreatitis, and more presents the ED with a chief complaint of nausea and vomiting for 3 days.  Patient reports she has had nausea and vomiting anytime she has any p.o. intake.  She has had no hematemesis or coffee-ground emesis.  She does report palpitations, flushing, and diaphoresis.  She denies any chest pain.  Patient reports she has had diarrhea for 2 days.  Her last meal was Monday evening.  She has no sick contacts that she knows of, but she has been in the hospital visiting her sister who had a stroke.  Patient reports subjective fever and chills.  She has had no blood in her stool.  When she did have diarrhea that was 4 times during the day.  It has slowed down since she has not been eating.  She reports when she vomits it looks like green or yellow color.  She has left lower quadrant pain that feels like a constant ache.  Is also worse with p.o. intake.  Positional changes make her pain worse, rest makes it better.  Patient denies any dysuria or hematuria.  Patient has no other complaints at this time.  Patient quit smoking 3 weeks ago, does not drink alcohol, does not use illicit drugs, is vaccinated for COVID.  Patient is DNR. Review of Systems: As mentioned in the history of present illness. All other systems reviewed and are negative. Past Medical History:  Diagnosis Date   Anemia    Arthritis    Bartholin cyst 07/03/2014   Colitis 08/2015   Treated at [facility]   Diabetes mellitus    Hypertension    Labial abscess 08/2015   Status  post I&D by Dr. Ladona Horns   Nicotine addiction 09/03/2013   Obesity    Pancreatitis 09/2015   unknown etiology, no further work-up with gastroenterology    Vaginal itching 09/03/2013   Past Surgical History:  Procedure Laterality Date   ABDOMINAL HYSTERECTOMY     ABDOMINAL SURGERY     exploratry with TAH   BALLOON DILATION N/A 10/26/2016   Procedure: Pyloric channel BALLOON DILATION;  Surgeon: Daneil Dolin, MD;  Location: [facility];  Service: Endoscopy;  Laterality: N/A;   BARTHOLIN GLAND CYST EXCISION Right 08/20/2014   Procedure: EXCISION OF RIGHT BARTHOLIN'S GLAND CYST;  Surgeon: Florian Buff, MD;  Location: [facility] ORS;  Service: Gynecology;  Laterality: Right;   CHOLECYSTECTOMY     COLONOSCOPY  10/2015   Doristine Mango: mild diverticulosis, at least 8 polpys removed, multiple tubular adenomas. next tcs in 1 year   ESOPHAGOGASTRODUODENOSCOPY N/A 10/26/2016   Procedure: ESOPHAGOGASTRODUODENOSCOPY (EGD);  Surgeon: Daneil Dolin, MD;  Location: [facility];  Service: Endoscopy;  Laterality: N/A;   FOOT SURGERY     FOOT SURGERY Right 2011   shave bone   TONSILLECTOMY     Social History:  reports that she quit smoking about 5 years ago. Her smoking use included cigarettes. She has a 0.75 pack-year smoking history. She has never used smokeless tobacco. She reports that she does  not currently use drugs after having used the following drugs: Marijuana. She reports that she does not drink alcohol.  Allergies  Allergen Reactions   Iohexol Swelling     Code: HIVES, Desc: PT STATES THE EYES SWOLL AND ITCHED, NEEDS PRE MEDS     Family History  Problem Relation Age of Onset   Diabetes Mother    Stroke Mother    Cancer Father        liver and colon. Patient unsure primary    Stomach cancer Father    Diabetes Sister    Stomach cancer Sister    Liver cancer Sister    Bone cancer Sister    Diabetes Brother    Lung cancer Brother    Heart disease Son    Breast cancer Neg Hx      Prior to Admission medications   Medication Sig Start Date End Date Taking? Authorizing Provider  apixaban (ELIQUIS) 5 MG TABS tablet Take 2 tablets (80m) twice daily for 7 days, then 1 tablet (585m twice daily Patient not taking: Reported on 06/07/2022 11/01/21   FaMarcello FennelPA-C  atorvastatin (LIPITOR) 10 MG tablet Take 1 tablet (10 mg total) by mouth daily. 02/18/22   Paseda, FoDewaine CongerFNP  diclofenac Sodium (VOLTAREN) 1 % GEL     [provider]  glyBURIDE-metformin (GLUCOVANCE) 5-500 MG tablet TAKE 1 TABLET BY MOUTH TWICE DAILY 06/16/22   Paseda, FoDewaine CongerFNP  Lancets (ONETOUCH ULTRASOFT) lancets Use as instructed 04/13/22   Paseda, FoDewaine CongerFNP  lisinopril (ZESTRIL) 20 MG tablet Take 1 tablet (20 mg total) by mouth daily. 02/17/22   Paseda, FoDewaine CongerFNP  ondansetron (ZOFRAN-ODT) 8 MG disintegrating tablet Take 1 tablet (8 mg total) by mouth every 8 (eight) hours as needed for nausea or vomiting. 05/01/22   StLajean SaverMD  ON[facility] CenterERIO test strip 2 (two) times daily. 04/12/22   [provider]  traMADol (ULTRAM) 50 MG tablet Take 1 tablet (50 mg total) by mouth every 6 (six) hours as needed. Patient not taking: Reported on 06/07/2022 05/01/22   StLajean SaverMD  UNABLE TO FIND Blood glucose monitor kit check blood glucose twice daily dx: e11.65 04/12/22   PaRenee RivalFNP  UNABLE TO FIND Glucose test strips check blood glucose twice daily dx e11.65 04/12/22   Paseda, FoDewaine CongerFNP  Vitamin D, Ergocalciferol, (DRISDOL) 1.25 MG (50000 UNIT) CAPS capsule Take 1 capsule (50,000 Units total) by mouth every 7 (seven) days. 06/08/22   PaRenee RivalFNP    Physical Exam: Vitals:   06/17/22 0000 06/17/22 0030 06/17/22 0410 06/17/22 0440  BP: (!) 176/100 (!) 179/94 (!) 153/89 (!) 142/82  Pulse: 98 (!) 101 100 (!) 109  Resp: (!) '21 19 20 18  ' Temp:    98.1 F (36.7 C)  TempSrc:      SpO2: 97% 97% 98% 100%  Weight:      Height:       1.   General: Patient lying supine in bed,  no acute distress   2. Psychiatric: Alert and oriented x 3, mood and behavior normal for situation, pleasant and cooperative with exam   3. Neurologic: Speech and language are normal, face is symmetric, moves all 4 extremities voluntarily, at baseline without acute deficits on limited exam   4. HEENMT:  Head is atraumatic, normocephalic, pupils reactive to light, neck is supple, trachea is midline, mucous membranes are moist   5. Respiratory : Lungs are  clear to auscultation bilaterally without wheezing, rhonchi, rales, no cyanosis, no increase in work of breathing or accessory muscle use   6. Cardiovascular : Heart rate normal, rhythm is regular, no murmurs, rubs or gallops, no peripheral edema, peripheral pulses palpated   7. Gastrointestinal:  Abdomen is soft, nondistended, minimal tenderness to palpation bowel sounds active, no masses or organomegaly palpated   8. Skin:  Skin is warm, dry and intact without rashes, acute lesions, or ulcers on limited exam   9.Musculoskeletal:  No acute deformities or trauma, no asymmetry in tone, no peripheral edema, peripheral pulses palpated, no tenderness to palpation in the extremities  Data Reviewed: In the ED Temp 97.8, heart rate 98-143, respiratory rate 13-21, blood pressure 118/81-179/100, satting at 97% Leukocytosis 21.7, hemoglobin 13.8, platelets 325 Sodium slightly low at 127, BUN 29, creatinine 1.54 Hyperglycemic glucose of 381 Chest x-ray shows no active disease CT abdomen pelvis shows nonspecific wall thickening of the duodenum and proximal jejunum which could reflect underlying enteritis. Patient also has an elevated lipase at 271-possible that pancreatitis is also contributing Blood culture pending, lactic acid three-point 3 repeat pending Flu and COVID-negative EKG shows a heart rate of 141, sinus tach with premature complexes Concern for viral versus bacterial enteritis, continue  Rocephin and Flagyl Admission requested for enteritis that caused dehydration and AKI Assessment and Plan: * Dehydration -2/2 GI losses -Lactic acidosis, AKI -2L NS bolus in ED -Continue IV infusion overnight -Monitor clinical response  Abnormal serum level of lipase -lipase 271 -Epigastric pain -CT abd/pel shows possible enteritis, but possibly could be underlying pancreatitis, NPO -pain control  SIRS (systemic inflammatory response syndrome) (HCC) -HR 143, R 21, WBC 21.7 -Lactic 3.3 -Cr 1.54 -blood cultures pending -Flu, and covid negative -most likely 2/2 enteritis -Procal pending -CXR = no active disease -UA pending -continue Rocephin and flagyl -NPO  Enteritis -bacterial vs viral -SIRS criteria -CT abdomen/Pelvis - shows underlying enteritis. No SBO or ileus.  -Supportive care with zofran and fluids -Continue maintenance fluids -Cipro flagyl in ED, continue antibiotics while Procal is pending -Continue to monitor  AKI (acute kidney injury) ([place]) -Cr 0.95 >>> 1.54 -Hold nephrotoxic agents including lisinopril -2L NS bolus in ED -Continue NS IV infusion -Trend in the AM  Essential hypertension -Holding lisinopril in the setting of AKI  Type 2 diabetes mellitus with other specified complication (HCC) -Hold glyburide and metformin -Last A1C 8.3 -Sliding scale coverage -NPO -Continue to monitor      Advance Care Planning:   Code Status: DNR   Consults: None  Family Communication: No family at bedside  Severity of Illness: The appropriate patient status for this patient is INPATIENT. Inpatient status is judged to be reasonable and necessary in order to provide the required intensity of service to ensure the patient's safety. The patient's presenting symptoms, physical exam findings, and initial radiographic and laboratory data in the context of their chronic comorbidities is felt to place them at high risk for further clinical deterioration.  Furthermore, it is not anticipated that the patient will be medically stable for discharge from the hospital within 2 midnights of admission.   * I certify that at the point of admission it is my clinical judgment that the patient will require inpatient hospital care spanning beyond 2 midnights from the point of admission due to high intensity of service, high risk for further deterioration and high frequency of surveillance required.*  Author: Rolla Plate, DO 06/17/2022 6:13 AM  For on call review [email or web address].

## 2022-06-17 NOTE — Assessment & Plan Note (Addendum)
-  Hold glyburide and metformin -Last A1C 8.3 CBG (last 3)  Recent Labs    06/19/22 1642 06/19/22 2044 06/20/22 0718  GLUCAP 127* 121* 110*     -Sliding scale coverage -Advancing diet,  - RESUME Home meds

## 2022-06-17 NOTE — Assessment & Plan Note (Signed)
-  Resolved -2/2 GI losses -Lactic acidosis, AKI -Resolved

## 2022-06-17 NOTE — Assessment & Plan Note (Addendum)
-  was Holding lisinopril in the setting of AKI -Improving kidney function -added Norvasc and resumed

## 2022-06-17 NOTE — Hospital Course (Signed)
Shawna Huerta is a 61 y.o. female with medical history significant of anemia, arthritis, diabetes mellitus type 2, hypertension, history of smoking, pancreatitis, and more presents the ED with a chief complaint of nausea and vomiting for 3 days.   Patient reports she has had nausea and vomiting anytime she has any p.o. intake.  She has had no hematemesis or coffee-ground emesis.  She does report palpitations, flushing, and diaphoresis.  She denies any chest pain.  Patient reports she has had diarrhea for 2 days.  Her last meal was Monday evening.  She has no sick contacts that she knows of, but she has been in the hospital visiting her sister who had a stroke.  Patient reports subjective fever and chills.  She has had no blood in her stool.  When she did have diarrhea that was 4 times during the day.  It has slowed down since she has not been eating.  She reports when she vomits it looks like green or yellow color.  She has left lower quadrant pain that feels like a constant ache.  Is also worse with p.o. intake.  Positional changes make her pain worse, rest makes it better.  Patient denies any dysuria or hematuria.  Patient has no other complaints at this time.  ED Temp 97.8, heart rate 98-143, respiratory rate 13-21, blood pressure 118/81-179/100, satting at 97% Leukocytosis 21.7, hemoglobin 13.8, platelets 325 Sodium slightly low at 127, BUN 29, creatinine 1.54 Hyperglycemic glucose of 381 Chest x-ray shows no active disease CT abdomen pelvis shows nonspecific wall thickening of the duodenum and proximal jejunum which could reflect underlying enteritis. Patient also has an elevated lipase at 271-possible that pancreatitis is also contributing Blood culture pending, lactic acid three-point 3 repeat pending Flu and COVID-negative EKG shows a heart rate of 141, sinus tach with premature complexes Concern for viral versus bacterial enteritis, continue Rocephin and Flagyl Admission requested for  enteritis that caused dehydration and AKI

## 2022-06-18 DIAGNOSIS — N179 Acute kidney failure, unspecified: Secondary | ICD-10-CM | POA: Diagnosis not present

## 2022-06-18 DIAGNOSIS — A419 Sepsis, unspecified organism: Secondary | ICD-10-CM | POA: Diagnosis not present

## 2022-06-18 DIAGNOSIS — R748 Abnormal levels of other serum enzymes: Secondary | ICD-10-CM | POA: Diagnosis not present

## 2022-06-18 DIAGNOSIS — E860 Dehydration: Secondary | ICD-10-CM | POA: Diagnosis not present

## 2022-06-18 LAB — CBC
HCT: 38 % (ref 36.0–46.0)
Hemoglobin: 12 g/dL (ref 12.0–15.0)
MCH: 21.7 pg — ABNORMAL LOW (ref 26.0–34.0)
MCHC: 31.6 g/dL (ref 30.0–36.0)
MCV: 68.7 fL — ABNORMAL LOW (ref 80.0–100.0)
Platelets: 263 10*3/uL (ref 150–400)
RBC: 5.53 MIL/uL — ABNORMAL HIGH (ref 3.87–5.11)
RDW: 17.6 % — ABNORMAL HIGH (ref 11.5–15.5)
WBC: 14.9 10*3/uL — ABNORMAL HIGH (ref 4.0–10.5)
nRBC: 0 % (ref 0.0–0.2)

## 2022-06-18 LAB — COMPREHENSIVE METABOLIC PANEL
ALT: 13 U/L (ref 0–44)
AST: 11 U/L — ABNORMAL LOW (ref 15–41)
Albumin: 3.5 g/dL (ref 3.5–5.0)
Alkaline Phosphatase: 53 U/L (ref 38–126)
Anion gap: 10 (ref 5–15)
BUN: 16 mg/dL (ref 6–20)
CO2: 21 mmol/L — ABNORMAL LOW (ref 22–32)
Calcium: 8.7 mg/dL — ABNORMAL LOW (ref 8.9–10.3)
Chloride: 102 mmol/L (ref 98–111)
Creatinine, Ser: 0.79 mg/dL (ref 0.44–1.00)
GFR, Estimated: >60 mL/min (ref >60)
Glucose, Bld: 159 mg/dL — ABNORMAL HIGH (ref 70–99)
Potassium: 3.5 mmol/L (ref 3.5–5.1)
Sodium: 133 mmol/L — ABNORMAL LOW (ref 135–145)
Total Bilirubin: 0.6 mg/dL (ref 0.3–1.2)
Total Protein: 7.7 g/dL (ref 6.5–8.1)

## 2022-06-18 LAB — URINE CULTURE: Culture: <10000 — AB

## 2022-06-18 LAB — LIPASE, BLOOD: Lipase: 210 U/L — ABNORMAL HIGH (ref 11–51)

## 2022-06-18 LAB — GLUCOSE, CAPILLARY
Glucose-Capillary: 172 mg/dL — ABNORMAL HIGH (ref 70–99)
Glucose-Capillary: 162 mg/dL — ABNORMAL HIGH (ref 70–99)
Glucose-Capillary: 125 mg/dL — ABNORMAL HIGH (ref 70–99)
Glucose-Capillary: 136 mg/dL — ABNORMAL HIGH (ref 70–99)

## 2022-06-18 MED ORDER — HYDRALAZINE HCL 20 MG/ML IJ SOLN
10.0000 mg | INTRAMUSCULAR | Status: DC | PRN
  Administered 2022-06-18: 10 mg via INTRAVENOUS
  Filled 2022-06-18: qty 1

## 2022-06-18 MED ORDER — AMLODIPINE BESYLATE 5 MG PO TABS
5.0000 mg | ORAL_TABLET | Freq: Every day | ORAL | Status: DC
  Administered 2022-06-18 – 2022-06-20 (×3): 5 mg via ORAL
  Filled 2022-06-18 (×3): qty 1

## 2022-06-18 NOTE — Progress Notes (Signed)
PROGRESS NOTE    Patient: Shawna Huerta                            PCP: Renee Rival, FNP                    DOB: Apr 18, 1961            DOA: 06/16/2022 TIW:[id number]             DOS: 06/18/2022, 12:03 PM   LOS: 1 day   Date of Service: The patient was seen and examined on 06/18/2022  Subjective:   The patient was seen and examined this morning, stable, reporting no further diarrhea, Has not had any further bowel movements yet Denies any shortness of breath or chest pain Abdominal discomfort improved with analgesics  Brief Narrative:   Shawna Huerta is a 61 y.o. female with medical history significant of anemia, arthritis, diabetes mellitus type 2, hypertension, history of smoking, pancreatitis, and more presents the ED with a chief complaint of nausea and vomiting for 3 days.   Patient reports she has had nausea and vomiting anytime she has any p.o. intake.  She has had no hematemesis or coffee-ground emesis.  She does report palpitations, flushing, and diaphoresis.  She denies any chest pain.  Patient reports she has had diarrhea for 2 days.  Her last meal was Monday evening.  She has no sick contacts that she knows of, but she has been in the hospital visiting her sister who had a stroke.  Patient reports subjective fever and chills.  She has had no blood in her stool.  When she did have diarrhea that was 4 times during the day.  It has slowed down since she has not been eating.  She reports when she vomits it looks like green or yellow color.  She has left lower quadrant pain that feels like a constant ache.  Is also worse with p.o. intake.  Positional changes make her pain worse, rest makes it better.  Patient denies any dysuria or hematuria.  Patient has no other complaints at this time.  ED Temp 97.8, heart rate 98-143, respiratory rate 13-21, blood pressure 118/81-179/100, satting at 97% Leukocytosis 21.7, hemoglobin 13.8, platelets 325 Sodium slightly low at 127, BUN 29,  creatinine 1.54 Hyperglycemic glucose of 381 Chest x-ray shows no active disease CT abdomen pelvis shows nonspecific wall thickening of the duodenum and proximal jejunum which could reflect underlying enteritis. Patient also has an elevated lipase at 271-possible that pancreatitis is also contributing Blood culture pending, lactic acid three-point 3 repeat pending Flu and COVID-negative EKG shows a heart rate of 141, sinus tach with premature complexes Concern for viral versus bacterial enteritis, continue Rocephin and Flagyl Admission requested for enteritis that caused dehydration and AKI    Assessment & Plan:   Principal Problem:   Sepsis ([place]) Active Problems:   Enteritis   AKI (acute kidney injury) ([place])   Dehydration   Abnormal serum level of lipase   Type 2 diabetes mellitus with other specified complication ([place])   Essential hypertension     Assessment and Plan: * Sepsis ([place]) - Met sepsis criteria on admission: Tachycardia, tachypnea,, lactic acidosis, leukocytosis, AKI -Likely source of infection gastritis  -WBC 21.7 >>> 18.5 >> 14.9 -Lactic 3.3 >> 2.3,  -Cr 1.54 >> 1.11>> 0.79 -Blood cultures >> no growth to date -Flu, and covid negative -most likely 2/2 enteritis -Procal  pending -CXR = no active disease -UA -reviewed no signs of infection -Continue Rocephin and flagyl -Advancing diet as tolerated  Enteritis -bacterial vs viral -Met sepsis criteria on admission -CT abdomen/Pelvis - shows underlying enteritis. No SBO or ileus.  -Supportive care with zofran and fluids -Continue maintenance fluids -Cipro flagyl in ED, continue antibiotics -Improving leukocytosis 21.7, 18.5,, lactic acid 3.3, 3.2, 2.3 procalcitonin 0.23, -Continue to monitor -Continue antibiotics  SIRS (systemic inflammatory response syndrome) (HCC)-resolved as of 06/17/2022  Abnormal serum level of lipase -lipase 271 >>210 -Epigastric pain --improving with analgesics -CT abd/pel  shows possible enteritis, but possibly could be underlying pancreatitis, NPO -pain control -Continue IV fluids  Dehydration -Resolved -2/2 GI losses -Lactic acidosis, AKI -2L NS bolus in ED -Continue IV infusion overnight -Monitor clinical response  AKI (acute kidney injury) (HCC) -Improving -Cr 0.95 >>> 1.54 >> 1.11, 0.79 -Hold nephrotoxic agents including lisinopril -2L NS bolus in ED -Continue NS IV infusion -Monitoring kidney function closely  Essential hypertension -Holding lisinopril in the setting of AKI -Improving kidney function -BP remained stable  Type 2 diabetes mellitus with other specified complication (HCC) -Hold glyburide and metformin -Last A1C 8.3 CBG (last 3)  Recent Labs    06/17/22 2132 06/18/22 0740 06/18/22 1120  GLUCAP 156* 172* 162*     -Sliding scale coverage -Advancing diet, titrating insulin -Continue to monitor     ----------------------------------------------------------------------------------------------------------------------------------------- Cultures; Blood Cultures x 2 >> Urine Culture  >>>   -----------------------------------------------------------------------------------------------------------------------------------------  DVT prophylaxis:  enoxaparin (LOVENOX) injection 40 mg Start: 06/17/22 1100 SCDs Start: 06/17/22 0451   Code Status:   Code Status: DNR  Family Communication: No family member present at bedside- attempt will be made to update daily The above findings and plan of care has been discussed with patient (and family)  in detail,  they expressed understanding and agreement of above. -Advance care planning has been discussed.   Admission status:   Status is: Inpatient Remains inpatient appropriate because: Continued need for IV fluid, IV antibiotics, treating sepsis     Procedures:   No admission procedures for hospital encounter.   Antimicrobials:  Anti-infectives (From admission,  onward)    Start     Dose/Rate Route Frequency Ordered Stop   06/17/22 1000  cefTRIAXone (ROCEPHIN) 2 g in sodium chloride 0.9 % 100 mL IVPB        2 g 200 mL/hr over 30 Minutes Intravenous Every 24 hours 06/17/22 0450 06/24/22 0959   06/16/22 2345  ciprofloxacin (CIPRO) IVPB 400 mg        400 mg 200 mL/hr over 60 Minutes Intravenous  Once 06/16/22 2330 06/17/22 0111   06/16/22 2345  metroNIDAZOLE (FLAGYL) IVPB 500 mg        500 mg 100 mL/hr over 60 Minutes Intravenous Every 12 hours 06/16/22 2330          Medication:   acidophilus  1 capsule Oral TID WC   atorvastatin  10 mg Oral Daily   enoxaparin (LOVENOX) injection  40 mg Subcutaneous Q24H   insulin aspart  0-15 Units Subcutaneous TID WC   insulin aspart  0-5 Units Subcutaneous QHS   insulin detemir  10 Units Subcutaneous Daily    acetaminophen **OR** acetaminophen, ondansetron **OR** ondansetron (ZOFRAN) IV, oxyCODONE   Objective:   Vitals:   06/17/22 1446 06/17/22 2131 06/18/22 0452 06/18/22 0644  BP: 139/81 (!) 152/81 136/68   Pulse: (!) 102 (!) 106 100   Resp: _0 Temp: 98.4 F (36.9  C) 98.4 F (36.9 C) 98.5 F (36.9 C)   TempSrc: Oral Oral Oral   SpO2: 100% 99% 99%   Weight:    102.1 kg  Height:        Intake/Output Summary (Last 24 hours) at 06/18/2022 1203 Last data filed at 06/17/2022 1834 Gross per 24 hour  Intake 611.83 ml  Output --  Net 611.83 ml   Filed Weights   06/16/22 2125 06/18/22 0644  Weight: 99.8 kg 102.1 kg     Examination:       General:  AAO x 3,  cooperative, no distress;   HEENT:  Normocephalic, PERRL, otherwise with in Normal limits   Neuro:  CNII-XII intact. , normal motor and sensation, reflexes intact   Lungs:   Clear to auscultation BL, Respirations unlabored,  No wheezes / crackles  Cardio:    S1/S2, RRR, No murmure, No Rubs or Gallops   Abdomen:  Soft, mildly tender, bowel sounds active all four quadrants, no guarding or peritoneal signs.  Muscular   skeletal:  Limited exam -global generalized weaknesses - in bed, able to move all 4 extremities,   2+ pulses,  symmetric, No pitting edema  Skin:  Dry, warm to touch, negative for any Rashes,  Wounds: Please see nursing documentation         ------------------------------------------------------------------------------------------------------------------------------------------    LABs:     Latest Ref Rng & Units 06/18/2022    4:40 AM 06/17/2022    5:45 AM 06/16/2022   10:05 PM  CBC  WBC 4.0 - 10.5 K/uL 14.9  18.5  21.7   Hemoglobin 12.0 - 15.0 g/dL 12.0  13.0  13.8   Hematocrit 36.0 - 46.0 % 38.0  41.1  43.0   Platelets 150 - 400 K/uL 263  274  325       Latest Ref Rng & Units 06/18/2022    4:40 AM 06/17/2022    5:45 AM 06/16/2022   10:05 PM  CMP  Glucose 70 - 99 mg/dL 159  302  381   BUN 6 - 20 mg/dL _0 Creatinine 0.44 - 1.00 mg/dL 0.79  1.11  1.54   Sodium 135 - 145 mmol/L 133  131  127   Potassium 3.5 - 5.1 mmol/L 3.5  3.9  4.3   Chloride 98 - 111 mmol/L 102  98  92   CO2 22 - 32 mmol/L _1 Calcium 8.9 - 10.3 mg/dL 8.7  8.6  9.5   Total Protein 6.5 - 8.1 g/dL 7.7  7.9  9.7   Total Bilirubin 0.3 - 1.2 mg/dL 0.6  0.4  0.6   Alkaline Phos 38 - 126 U/L 53  61  73   AST 15 - 41 U/L _2 ALT 0 - 44 U/L _3 Micro Results Recent Results (from the past 240 hour(s))  Resp Panel by RT-PCR (Flu A&B, Covid) Anterior Nasal Swab     Status: None   Collection Time: 06/16/22 10:16 PM   Specimen: Anterior Nasal Swab  Result Value Ref Range Status   SARS Coronavirus 2 by RT PCR NEGATIVE NEGATIVE Final    Comment: (NOTE) SARS-CoV-2 target nucleic acids are NOT DETECTED.  The SARS-CoV-2 RNA is generally detectable in upper respiratory specimens during the acute phase of infection. The lowest concentration of SARS-CoV-2 viral copies this assay can detect is  138 copies/mL. A negative result does not preclude SARS-Cov-2 infection and  should not be used as the sole basis for treatment or other patient management decisions. A negative result may occur with  improper specimen collection/handling, submission of specimen other than nasopharyngeal swab, presence of viral mutation(s) within the areas targeted by this assay, and inadequate number of viral copies(<138 copies/mL). A negative result must be combined with clinical observations, patient history, and epidemiological information. The expected result is Negative.  Fact Sheet for Patients:  EntrepreneurPulse.com.au  Fact Sheet for Healthcare Providers:  IncredibleEmployment.be  This test is no t yet approved or cleared by the [place] FDA and  has been authorized for detection and/or diagnosis of SARS-CoV-2 by FDA under an Emergency Use Authorization (EUA). This EUA will remain  in effect (meaning this test can be used) for the duration of the COVID-19 declaration under Section 564(b)(1) of the Act, 21 U.S.C.section 360bbb-3(b)(1), unless the authorization is terminated  or revoked sooner.       Influenza A by PCR NEGATIVE NEGATIVE Final   Influenza B by PCR NEGATIVE NEGATIVE Final    Comment: (NOTE) The Xpert Xpress SARS-CoV-2/FLU/RSV plus assay is intended as an aid in the diagnosis of influenza from Nasopharyngeal swab specimens and should not be used as a sole basis for treatment. Nasal washings and aspirates are unacceptable for Xpert Xpress SARS-CoV-2/FLU/RSV testing.  Fact Sheet for Patients: EntrepreneurPulse.com.au  Fact Sheet for Healthcare Providers: IncredibleEmployment.be  This test is not yet approved or cleared by the [place] FDA and has been authorized for detection and/or diagnosis of SARS-CoV-2 by FDA under an Emergency Use Authorization (EUA). This EUA will remain in effect (meaning this test can be used) for the duration of the COVID-19 declaration  under Section 564(b)(1) of the Act, 21 U.S.C. section 360bbb-3(b)(1), unless the authorization is terminated or revoked.  Performed at [facility], [address], [place], [place] [address]   Culture, blood (routine x 2)     Status: None (Preliminary result)   Collection Time: 06/16/22 10:27 PM   Specimen: BLOOD  Result Value Ref Range Status   Specimen Description BLOOD BLOOD LEFT ARM  Final   Special Requests   Final    BOTTLES DRAWN AEROBIC AND ANAEROBIC Blood Culture adequate volume   Culture   Final    NO GROWTH 2 DAYS Performed at [facility], [address], [place], [place] [address]    Report Status PENDING  Incomplete  Culture, blood (routine x 2)     Status: None (Preliminary result)   Collection Time: 06/16/22 10:31 PM   Specimen: BLOOD  Result Value Ref Range Status   Specimen Description BLOOD BLOOD LEFT HAND  Final   Special Requests   Final    BOTTLES DRAWN AEROBIC AND ANAEROBIC Blood Culture adequate volume   Culture   Final    NO GROWTH 2 DAYS Performed at [facility], [address], [place], [place] [address]    Report Status PENDING  Incomplete    Radiology Reports No results found.  SIGNED: Deatra James, MD, FHM. [facility],  Pager (please use [email or web address] to page/text) Please use Epic Secure Chat for non-urgent communication (7AM-7PM)  If 7PM-7AM, please contact night-coverage [email or web address], 06/18/2022, 12:03 PM

## 2022-06-19 DIAGNOSIS — N179 Acute kidney failure, unspecified: Secondary | ICD-10-CM | POA: Diagnosis not present

## 2022-06-19 DIAGNOSIS — A419 Sepsis, unspecified organism: Secondary | ICD-10-CM | POA: Diagnosis not present

## 2022-06-19 DIAGNOSIS — R748 Abnormal levels of other serum enzymes: Secondary | ICD-10-CM | POA: Diagnosis not present

## 2022-06-19 DIAGNOSIS — E860 Dehydration: Secondary | ICD-10-CM | POA: Diagnosis not present

## 2022-06-19 LAB — COMPREHENSIVE METABOLIC PANEL
ALT: 14 U/L (ref 0–44)
AST: 13 U/L — ABNORMAL LOW (ref 15–41)
Albumin: 3 g/dL — ABNORMAL LOW (ref 3.5–5.0)
Alkaline Phosphatase: 45 U/L (ref 38–126)
Anion gap: 7 (ref 5–15)
BUN: 10 mg/dL (ref 6–20)
CO2: 22 mmol/L (ref 22–32)
Calcium: 8.4 mg/dL — ABNORMAL LOW (ref 8.9–10.3)
Chloride: 106 mmol/L (ref 98–111)
Creatinine, Ser: 0.66 mg/dL (ref 0.44–1.00)
GFR, Estimated: >60 mL/min (ref >60)
Glucose, Bld: 115 mg/dL — ABNORMAL HIGH (ref 70–99)
Potassium: 3.3 mmol/L — ABNORMAL LOW (ref 3.5–5.1)
Sodium: 135 mmol/L (ref 135–145)
Total Bilirubin: 0.8 mg/dL (ref 0.3–1.2)
Total Protein: 6.4 g/dL — ABNORMAL LOW (ref 6.5–8.1)

## 2022-06-19 LAB — GLUCOSE, CAPILLARY
Glucose-Capillary: 117 mg/dL — ABNORMAL HIGH (ref 70–99)
Glucose-Capillary: 154 mg/dL — ABNORMAL HIGH (ref 70–99)
Glucose-Capillary: 127 mg/dL — ABNORMAL HIGH (ref 70–99)
Glucose-Capillary: 121 mg/dL — ABNORMAL HIGH (ref 70–99)

## 2022-06-19 LAB — CBC
HCT: 32.6 % — ABNORMAL LOW (ref 36.0–46.0)
Hemoglobin: 10.2 g/dL — ABNORMAL LOW (ref 12.0–15.0)
MCH: 21.8 pg — ABNORMAL LOW (ref 26.0–34.0)
MCHC: 31.3 g/dL (ref 30.0–36.0)
MCV: 69.7 fL — ABNORMAL LOW (ref 80.0–100.0)
Platelets: 227 10*3/uL (ref 150–400)
RBC: 4.68 MIL/uL (ref 3.87–5.11)
RDW: 17.2 % — ABNORMAL HIGH (ref 11.5–15.5)
WBC: 11.8 10*3/uL — ABNORMAL HIGH (ref 4.0–10.5)
nRBC: 0 % (ref 0.0–0.2)

## 2022-06-19 LAB — LIPASE, BLOOD: Lipase: 34 U/L (ref 11–51)

## 2022-06-19 MED ORDER — METRONIDAZOLE 500 MG PO TABS
500.0000 mg | ORAL_TABLET | Freq: Three times a day (TID) | ORAL | Status: DC
  Administered 2022-06-19 – 2022-06-20 (×4): 500 mg via ORAL
  Filled 2022-06-19 (×4): qty 1

## 2022-06-19 MED ORDER — POLYETHYLENE GLYCOL 3350 17 G PO PACK
17.0000 g | PACK | Freq: Two times a day (BID) | ORAL | Status: DC
  Administered 2022-06-19: 17 g via ORAL
  Filled 2022-06-19 (×3): qty 1

## 2022-06-19 NOTE — Progress Notes (Signed)
PROGRESS NOTE    Patient: Shawna Huerta                            PCP: Renee Rival, FNP                    DOB: Mar 31, 1961            DOA: 06/16/2022 BHA:[id number]             DOS: 06/19/2022, 11:42 AM   LOS: 2 days   Date of Service: The patient was seen and examined on 06/19/2022  Subjective:   The patient was seen and examined this morning, remained stable, no acute distress Reporting of no bowel movement yet Abdominal pain, nausea vomiting Tolerating clear soft diet  Brief Narrative:   Shawna Huerta is a 61 y.o. female with medical history significant of anemia, arthritis, diabetes mellitus type 2, hypertension, history of smoking, pancreatitis, and more presents the ED with a chief complaint of nausea and vomiting for 3 days.   Patient reports she has had nausea and vomiting anytime she has any p.o. intake.  She has had no hematemesis or coffee-ground emesis.  She does report palpitations, flushing, and diaphoresis.  She denies any chest pain.  Patient reports she has had diarrhea for 2 days.  Her last meal was Monday evening.  She has no sick contacts that she knows of, but she has been in the hospital visiting her sister who had a stroke.  Patient reports subjective fever and chills.  She has had no blood in her stool.  When she did have diarrhea that was 4 times during the day.  It has slowed down since she has not been eating.  She reports when she vomits it looks like green or yellow color.  She has left lower quadrant pain that feels like a constant ache.  Is also worse with p.o. intake.  Positional changes make her pain worse, rest makes it better.  Patient denies any dysuria or hematuria.  Patient has no other complaints at this time.  ED Temp 97.8, heart rate 98-143, respiratory rate 13-21, blood pressure 118/81-179/100, satting at 97% Leukocytosis 21.7, hemoglobin 13.8, platelets 325 Sodium slightly low at 127, BUN 29, creatinine 1.54 Hyperglycemic glucose of 381 Chest  x-ray shows no active disease CT abdomen pelvis shows nonspecific wall thickening of the duodenum and proximal jejunum which could reflect underlying enteritis. Patient also has an elevated lipase at 271-possible that pancreatitis is also contributing Blood culture pending, lactic acid three-point 3 repeat pending Flu and COVID-negative EKG shows a heart rate of 141, sinus tach with premature complexes Concern for viral versus bacterial enteritis, continue Rocephin and Flagyl Admission requested for enteritis that caused dehydration and AKI    Assessment & Plan:   Principal Problem:   Sepsis ([place]) Active Problems:   Enteritis   AKI (acute kidney injury) ([place])   Dehydration   Abnormal serum level of lipase   Type 2 diabetes mellitus with other specified complication ([place])   Essential hypertension     Assessment and Plan: * Sepsis ([place]) -Much improved sepsis physiology - Met sepsis criteria on admission: Tachycardia, tachypnea,, lactic acidosis, leukocytosis, AKI -Likely source of infection gastritis  -WBC 21.7 >>> 18.5 >> 14.9 -Lactic 3.3 >> 2.3,  -Cr 1.54 >> 1.11>> 0.79 -Blood cultures >> no growth to date -Flu, and covid negative -most likely 2/2 enteritis  -CXR = no  active disease -UA -reviewed no signs of infection -Continue Rocephin and flagyl -Advancing diet as tolerated  Hypokalemia  --Bleeding with p.o. 40 mg KCl  enteritis -Pain, much improved -bacterial vs viral -Met sepsis criteria on admission -CT abdomen/Pelvis - shows underlying enteritis. No SBO or ileus.  -Supportive care with zofran and fluids -Continue maintenance fluids -Cipro flagyl in ED, continue antibiotics -Improving leukocytosis 21.7, 18.5,, lactic acid 3.3, 3.2, 2.3 procalcitonin 0.23, -Continue to monitor -Continue antibiotics--Flagyl to p.o., IV Rocephin  SIRS (systemic inflammatory response syndrome) (HCC)-resolved as of 06/17/2022  Abnormal serum level of lipase -lipase 271  >>210 >>34  -Epigastric pain --improving with analgesics -CT abd/pel shows possible enteritis, but possibly could be underlying pancreatitis, NPO -pain control -Discontinue IV fluids  Dehydration -Resolved -Status post IV fluid resuscitation  AKI (acute kidney injury) ([place]) -Resolved -Cr 0.95 >>> 1.54 >> 1.11, 0.79 -Hold nephrotoxic agents including lisinopril - responded well to IV fluid resuscitation  Essential hypertension -Holding lisinopril in the setting of AKI -Improving kidney function -BP remained stable  Type 2 diabetes mellitus with other specified complication (HCC) -Hold glyburide and metformin -Last A1C 8.3 CBG (last 3)  Recent Labs    06/18/22 2130 06/19/22 0708 06/19/22 1133  GLUCAP 136* 117* 154*       -Sliding scale coverage -Advancing diet, titrating insulin -Continue to monitor     ----------------------------------------------------------------------------------------------------------------------------------------- Cultures; Blood Cultures x 2 >> Urine Culture  >>>   -----------------------------------------------------------------------------------------------------------------------------------------  DVT prophylaxis:  enoxaparin (LOVENOX) injection 40 mg Start: 06/17/22 1100 SCDs Start: 06/17/22 0451   Code Status:   Code Status: DNR  Family Communication: No family member present at bedside- attempt will be made to update daily The above findings and plan of care has been discussed with patient (and family)  in detail,  they expressed understanding and agreement of above. -Advance care planning has been discussed.   Disposition : From home likely will be discharged home in a.m. if tolerating p.o.  admission status:   Status is: Inpatient Remains inpatient appropriate because: Continued need for IV fluid, IV antibiotics, treating sepsis     Procedures:   No admission procedures for hospital  encounter.   Antimicrobials:  Anti-infectives (From admission, onward)    Start     Dose/Rate Route Frequency Ordered Stop   06/19/22 0815  metroNIDAZOLE (FLAGYL) tablet 500 mg        500 mg Oral Every 8 hours 06/19/22 0729     06/17/22 1000  cefTRIAXone (ROCEPHIN) 2 g in sodium chloride 0.9 % 100 mL IVPB        2 g 200 mL/hr over 30 Minutes Intravenous Every 24 hours 06/17/22 0450 06/24/22 0959   06/16/22 2345  ciprofloxacin (CIPRO) IVPB 400 mg        400 mg 200 mL/hr over 60 Minutes Intravenous  Once 06/16/22 2330 06/17/22 0111   06/16/22 2345  metroNIDAZOLE (FLAGYL) IVPB 500 mg  Status:  Discontinued        500 mg 100 mL/hr over 60 Minutes Intravenous Every 12 hours 06/16/22 2330 06/19/22 0729        Medication:   acidophilus  1 capsule Oral TID WC   amLODipine  5 mg Oral Daily   atorvastatin  10 mg Oral Daily   enoxaparin (LOVENOX) injection  40 mg Subcutaneous Q24H   insulin aspart  0-15 Units Subcutaneous TID WC   insulin aspart  0-5 Units Subcutaneous QHS   insulin detemir  10 Units Subcutaneous Daily   metroNIDAZOLE  500 mg Oral Q8H   polyethylene glycol  17 g Oral BID    acetaminophen **OR** acetaminophen, hydrALAZINE, ondansetron **OR** ondansetron (ZOFRAN) IV, oxyCODONE   Objective:   Vitals:   06/18/22 2130 06/19/22 0402 06/19/22 0524 06/19/22 0920  BP: 118/66  (!) 148/68 137/65  Pulse: (!) 108  92   Resp: 18  19   Temp: 98.8 F (37.1 C)  98.2 F (36.8 C)   TempSrc: Oral  Oral   SpO2: 100%  98%   Weight:  102.9 kg    Height:        Intake/Output Summary (Last 24 hours) at 06/19/2022 1142 Last data filed at 06/19/2022 1100 Gross per 24 hour  Intake 4295.03 ml  Output --  Net 4295.03 ml   Filed Weights   06/16/22 2125 06/18/22 0644 06/19/22 0402  Weight: 99.8 kg 102.1 kg 102.9 kg     Examination:     General:  AAO x 3,  cooperative, no distress;   HEENT:  Normocephalic, PERRL, otherwise with in Normal limits   Neuro:  CNII-XII intact. ,  normal motor and sensation, reflexes intact   Lungs:   Clear to auscultation BL, Respirations unlabored,  No wheezes / crackles  Cardio:    S1/S2, RRR, No murmure, No Rubs or Gallops   Abdomen:  Soft, non-tender, bowel sounds active all four quadrants, no guarding or peritoneal signs.  Muscular  skeletal:  Limited exam -global generalized weaknesses - in bed, able to move all 4 extremities,   2+ pulses,  symmetric, No pitting edema  Skin:  Dry, warm to touch, negative for any Rashes,  Wounds: Please see nursing documentation         ------------------------------------------------------------------------------------------------------------------------------------------    LABs:     Latest Ref Rng & Units 06/19/2022    6:23 AM 06/18/2022    4:40 AM 06/17/2022    5:45 AM  CBC  WBC 4.0 - 10.5 K/uL 11.8  14.9  18.5   Hemoglobin 12.0 - 15.0 g/dL 10.2  12.0  13.0   Hematocrit 36.0 - 46.0 % 32.6  38.0  41.1   Platelets 150 - 400 K/uL 227  263  274       Latest Ref Rng & Units 06/19/2022    6:23 AM 06/18/2022    4:40 AM 06/17/2022    5:45 AM  CMP  Glucose 70 - 99 mg/dL 115  159  302   BUN 6 - 20 mg/dL '10  16  26   ' Creatinine 0.44 - 1.00 mg/dL 0.66  0.79  1.11   Sodium 135 - 145 mmol/L 135  133  131   Potassium 3.5 - 5.1 mmol/L 3.3  3.5  3.9   Chloride 98 - 111 mmol/L 106  102  98   CO2 22 - 32 mmol/L '22  21  23   ' Calcium 8.9 - 10.3 mg/dL 8.4  8.7  8.6   Total Protein 6.5 - 8.1 g/dL 6.4  7.7  7.9   Total Bilirubin 0.3 - 1.2 mg/dL 0.8  0.6  0.4   Alkaline Phos 38 - 126 U/L 45  53  61   AST 15 - 41 U/L '13  11  9   ' ALT 0 - 44 U/L '14  13  11        ' Micro Results Recent Results (from the past 240 hour(s))  Resp Panel by RT-PCR (Flu A&B, Covid) Anterior Nasal Swab     Status: None   Collection Time:  06/16/22 10:16 PM   Specimen: Anterior Nasal Swab  Result Value Ref Range Status   SARS Coronavirus 2 by RT PCR NEGATIVE NEGATIVE Final    Comment: (NOTE) SARS-CoV-2 target  nucleic acids are NOT DETECTED.  The SARS-CoV-2 RNA is generally detectable in upper respiratory specimens during the acute phase of infection. The lowest concentration of SARS-CoV-2 viral copies this assay can detect is 138 copies/mL. A negative result does not preclude SARS-Cov-2 infection and should not be used as the sole basis for treatment or other patient management decisions. A negative result may occur with  improper specimen collection/handling, submission of specimen other than nasopharyngeal swab, presence of viral mutation(s) within the areas targeted by this assay, and inadequate number of viral copies(<138 copies/mL). A negative result must be combined with clinical observations, patient history, and epidemiological information. The expected result is Negative.  Fact Sheet for Patients:  EntrepreneurPulse.com.au  Fact Sheet for Healthcare Providers:  IncredibleEmployment.be  This test is no t yet approved or cleared by the [place] FDA and  has been authorized for detection and/or diagnosis of SARS-CoV-2 by FDA under an Emergency Use Authorization (EUA). This EUA will remain  in effect (meaning this test can be used) for the duration of the COVID-19 declaration under Section 564(b)(1) of the Act, 21 U.S.C.section 360bbb-3(b)(1), unless the authorization is terminated  or revoked sooner.       Influenza A by PCR NEGATIVE NEGATIVE Final   Influenza B by PCR NEGATIVE NEGATIVE Final    Comment: (NOTE) The Xpert Xpress SARS-CoV-2/FLU/RSV plus assay is intended as an aid in the diagnosis of influenza from Nasopharyngeal swab specimens and should not be used as a sole basis for treatment. Nasal washings and aspirates are unacceptable for Xpert Xpress SARS-CoV-2/FLU/RSV testing.  Fact Sheet for Patients: EntrepreneurPulse.com.au  Fact Sheet for Healthcare  Providers: IncredibleEmployment.be  This test is not yet approved or cleared by the [place] FDA and has been authorized for detection and/or diagnosis of SARS-CoV-2 by FDA under an Emergency Use Authorization (EUA). This EUA will remain in effect (meaning this test can be used) for the duration of the COVID-19 declaration under Section 564(b)(1) of the Act, 21 U.S.C. section 360bbb-3(b)(1), unless the authorization is terminated or revoked.  Performed at [facility], [address] [place], [place] [address]   Culture, blood (routine x 2)     Status: None (Preliminary result)   Collection Time: 06/16/22 10:27 PM   Specimen: BLOOD  Result Value Ref Range Status   Specimen Description BLOOD BLOOD LEFT ARM  Final   Special Requests   Final    BOTTLES DRAWN AEROBIC AND ANAEROBIC Blood Culture adequate volume   Culture   Final    NO GROWTH 3 DAYS Performed at [facility], [address] [place], [place] [address]    Report Status PENDING  Incomplete  Culture, blood (routine x 2)     Status: None (Preliminary result)   Collection Time: 06/16/22 10:31 PM   Specimen: BLOOD  Result Value Ref Range Status   Specimen Description BLOOD BLOOD LEFT HAND  Final   Special Requests   Final    BOTTLES DRAWN AEROBIC AND ANAEROBIC Blood Culture adequate volume   Culture   Final    NO GROWTH 3 DAYS Performed at [facility], [address] [place], [place] [address]    Report Status PENDING  Incomplete  Urine Culture     Status: Abnormal   Collection Time: 06/17/22  1:43 PM  Specimen: Urine, Clean Catch  Result Value Ref Range Status   Specimen Description   Final    URINE, CLEAN CATCH Performed at [facility], [address], [place], [place] [address]    Special Requests   Final    NONE Performed at [facility], [address], [place], [place] [address]    Culture (A)  Final    <10,000 COLONIES/mL INSIGNIFICANT GROWTH Performed at [place] [address], [place],  [address]    Report Status 06/18/2022 FINAL  Final    Radiology Reports No results found.  SIGNED: Deatra James, MD, FHM. [facility],  Pager (please use [email or web address] to page/text) Please use Epic Secure Chat for non-urgent communication (7AM-7PM)  If 7PM-7AM, please contact night-coverage [email or web address], 06/19/2022, 11:42 AM

## 2022-06-20 DIAGNOSIS — A419 Sepsis, unspecified organism: Secondary | ICD-10-CM | POA: Diagnosis not present

## 2022-06-20 DIAGNOSIS — R748 Abnormal levels of other serum enzymes: Secondary | ICD-10-CM | POA: Diagnosis not present

## 2022-06-20 DIAGNOSIS — N179 Acute kidney failure, unspecified: Secondary | ICD-10-CM | POA: Diagnosis not present

## 2022-06-20 DIAGNOSIS — E860 Dehydration: Secondary | ICD-10-CM | POA: Diagnosis not present

## 2022-06-20 LAB — COMPREHENSIVE METABOLIC PANEL
ALT: 25 U/L (ref 0–44)
AST: 29 U/L (ref 15–41)
Albumin: 3.2 g/dL — ABNORMAL LOW (ref 3.5–5.0)
Alkaline Phosphatase: 46 U/L (ref 38–126)
Anion gap: 8 (ref 5–15)
BUN: 8 mg/dL (ref 6–20)
CO2: 23 mmol/L (ref 22–32)
Calcium: 8.6 mg/dL — ABNORMAL LOW (ref 8.9–10.3)
Chloride: 105 mmol/L (ref 98–111)
Creatinine, Ser: 0.68 mg/dL (ref 0.44–1.00)
GFR, Estimated: >60 mL/min (ref >60)
Glucose, Bld: 99 mg/dL (ref 70–99)
Potassium: 3.2 mmol/L — ABNORMAL LOW (ref 3.5–5.1)
Sodium: 136 mmol/L (ref 135–145)
Total Bilirubin: 0.8 mg/dL (ref 0.3–1.2)
Total Protein: 6.7 g/dL (ref 6.5–8.1)

## 2022-06-20 LAB — GASTROINTESTINAL PANEL BY PCR, STOOL (REPLACES STOOL CULTURE)

## 2022-06-20 LAB — GLUCOSE, CAPILLARY
Glucose-Capillary: 110 mg/dL — ABNORMAL HIGH (ref 70–99)
Glucose-Capillary: 179 mg/dL — ABNORMAL HIGH (ref 70–99)

## 2022-06-20 LAB — CBC
HCT: 31.2 % — ABNORMAL LOW (ref 36.0–46.0)
Hemoglobin: 9.9 g/dL — ABNORMAL LOW (ref 12.0–15.0)
MCH: 21.8 pg — ABNORMAL LOW (ref 26.0–34.0)
MCHC: 31.7 g/dL (ref 30.0–36.0)
MCV: 68.6 fL — ABNORMAL LOW (ref 80.0–100.0)
Platelets: 219 10*3/uL (ref 150–400)
RBC: 4.55 MIL/uL (ref 3.87–5.11)
RDW: 17.1 % — ABNORMAL HIGH (ref 11.5–15.5)
WBC: 10.8 10*3/uL — ABNORMAL HIGH (ref 4.0–10.5)
nRBC: 0 % (ref 0.0–0.2)

## 2022-06-20 LAB — LIPASE, BLOOD: Lipase: 26 U/L (ref 11–51)

## 2022-06-20 MED ORDER — ONDANSETRON HCL 4 MG PO TABS: 4.0000 mg | ORAL_TABLET | Freq: Four times a day (QID) | ORAL | 0 refills | Status: DC | PRN

## 2022-06-20 MED ORDER — AMLODIPINE BESYLATE 5 MG PO TABS: 5.0000 mg | ORAL_TABLET | Freq: Every day | ORAL | 0 refills | Status: DC

## 2022-06-20 MED ORDER — LACTINEX PO CHEW: 1.0000 | CHEWABLE_TABLET | Freq: Three times a day (TID) | ORAL | 0 refills | Status: AC

## 2022-06-20 MED ORDER — METRONIDAZOLE 500 MG PO TABS: 500.0000 mg | ORAL_TABLET | Freq: Three times a day (TID) | ORAL | 0 refills | Status: AC

## 2022-06-20 MED ORDER — CIPROFLOXACIN HCL 500 MG PO TABS: 500.0000 mg | ORAL_TABLET | Freq: Two times a day (BID) | ORAL | 0 refills | Status: AC

## 2022-06-20 MED ORDER — POTASSIUM CHLORIDE CRYS ER 20 MEQ PO TBCR
40.0000 meq | EXTENDED_RELEASE_TABLET | Freq: Once | ORAL | Status: AC
  Administered 2022-06-20: 40 meq via ORAL
  Filled 2022-06-20: qty 2

## 2022-06-20 NOTE — Discharge Summary (Signed)
Physician Discharge Summary   Patient: Shawna Huerta MRN: [id number] DOB: Jan 29, 1961  Admit date:     06/16/2022  Discharge date: 06/20/22  Discharge Physician: Deatra James   PCP: Renee Rival, FNP   Recommendations at discharge:    Follow up with PCP in 1-2 weeks  Advance diet as tolerated   Discharge Diagnoses: Principal Problem:   Sepsis ([place]) Active Problems:   Enteritis   AKI (acute kidney injury) ([place])   Dehydration   Abnormal serum level of lipase   Type 2 diabetes mellitus with other specified complication ([place])   Essential hypertension  Resolved Problems:   SIRS (systemic inflammatory response syndrome) [facility])  Hospital Course: Shawna Huerta is a 61 y.o. female with medical history significant of anemia, arthritis, diabetes mellitus type 2, hypertension, history of smoking, pancreatitis, and more presents the ED with a chief complaint of nausea and vomiting for 3 days.   Patient reports she has had nausea and vomiting anytime she has any p.o. intake.  She has had no hematemesis or coffee-ground emesis.  She does report palpitations, flushing, and diaphoresis.  She denies any chest pain.  Patient reports she has had diarrhea for 2 days.  Her last meal was Monday evening.  She has no sick contacts that she knows of, but she has been in the hospital visiting her sister who had a stroke.  Patient reports subjective fever and chills.  She has had no blood in her stool.  When she did have diarrhea that was 4 times during the day.  It has slowed down since she has not been eating.  She reports when she vomits it looks like green or yellow color.  She has left lower quadrant pain that feels like a constant ache.  Is also worse with p.o. intake.  Positional changes make her pain worse, rest makes it better.  Patient denies any dysuria or hematuria.  Patient has no other complaints at this time.  ED Temp 97.8, heart rate 98-143, respiratory rate 13-21, blood pressure  118/81-179/100, satting at 97% Leukocytosis 21.7, hemoglobin 13.8, platelets 325 Sodium slightly low at 127, BUN 29, creatinine 1.54 Hyperglycemic glucose of 381 Chest x-ray shows no active disease CT abdomen pelvis shows nonspecific wall thickening of the duodenum and proximal jejunum which could reflect underlying enteritis. Patient also has an elevated lipase at 271-possible that pancreatitis is also contributing Blood culture pending, lactic acid three-point 3 repeat pending Flu and COVID-negative EKG shows a heart rate of 141, sinus tach with premature complexes Concern for viral versus bacterial enteritis, continue Rocephin and Flagyl Admission requested for enteritis that caused dehydration and AKI  Assessment and Plan: * Sepsis ([place]) -Resolved  - Met sepsis criteria on admission: Tachycardia, tachypnea,, lactic acidosis, leukocytosis, AKI -Likely source of infection gastritis  -WBC 21.7 >>> 18.5 >> 14.9>>> 10.8  -Lactic 3.3 >> 2.3,  -Cr 1.54 >> 1.11>> 0.79, 0.68 -Blood cultures >> no growth to date -Flu, and covid negative -most likely 2/2 enteritis -Procal pending -CXR = no active disease -UA -reviewed no signs of infection -was on IV Rocephin and flagyl >>> changed tp PO Cipro and Flagyl  -Advancing diet as tolerated  Enteritis -bacterial vs viral - Improved  -Met sepsis criteria on admission -CT abdomen/Pelvis - shows underlying enteritis. No SBO or ileus.  -Supportive care with zofran and fluids -s/p IVF  -Cipro flagyl in ED, continue antibiotics- changed to PO  -Improving leukocytosis -procalcitonin 0.23,   SIRS (systemic inflammatory response  syndrome) (HCC)-resolved as of 06/17/2022 -HR 143, R 21, WBC 21.7 -Lactic 3.3 -Cr 1.54 -blood cultures pending -Flu, and covid negative -most likely 2/2 enteritis -Procal pending -CXR = no active disease -UA pending -continue Rocephin and flagyl -NPO  Abnormal serum level of lipase -lipase 271 >>210,  34,26 -Epigastric pain --improving with analgesics -CT abd/pel shows possible enteritis, but possibly could be underlying pancreatitis,  -pain - resolved  -S/p  IV fluids-- D/ced   Dehydration -Resolved -2/2 GI losses -Lactic acidosis, AKI -Resolved   AKI (acute kidney injury) (HCC) -Improving -Cr 0.95 >>> 1.54 >> 1.11, 0.79, 0.68 -Resume  lisinopril -2L NS bolus in ED -Continue NS IV infusion   Essential hypertension -was Holding lisinopril in the setting of AKI -Improving kidney function -added Norvasc and resumed   Type 2 diabetes mellitus with other specified complication (HCC) -Hold glyburide and metformin -Last A1C 8.3 CBG (last 3)  Recent Labs    06/19/22 1642 06/19/22 2044 06/20/22 0718  GLUCAP 127* 121* 110*     -Sliding scale coverage -Advancing diet,  - RESUME Home meds            Disposition: Home Diet recommendation:  Discharge Diet Orders (From admission, onward)     Start     Ordered   06/20/22 0000  Diet - low sodium heart healthy        06/20/22 0738           Carb modified diet DISCHARGE MEDICATION: Allergies as of 06/20/2022       Reactions   Iohexol Swelling    Code: HIVES, Desc: PT STATES THE EYES SWOLL AND ITCHED, NEEDS PRE MEDS        Medication List     TAKE these medications    amLODipine 5 MG tablet Commonly known as: NORVASC Take 1 tablet (5 mg total) by mouth daily.   atorvastatin 10 MG tablet Commonly known as: LIPITOR Take 1 tablet (10 mg total) by mouth daily.   ciprofloxacin 500 MG tablet Commonly known as: Cipro Take 1 tablet (500 mg total) by mouth 2 (two) times daily for 5 days.   diclofenac Sodium 1 % Gel Commonly known as: VOLTAREN Apply 2 g topically daily as needed (pain).   glyBURIDE-metformin 5-500 MG tablet Commonly known as: GLUCOVANCE TAKE 1 TABLET BY MOUTH TWICE DAILY What changed:  how much to take how to take this when to take this additional instructions    lactobacillus acidophilus & bulgar chewable tablet Chew 1 tablet by mouth 3 (three) times daily with meals for 7 days.   lisinopril 20 MG tablet Commonly known as: ZESTRIL Take 1 tablet (20 mg total) by mouth daily.   metroNIDAZOLE 500 MG tablet Commonly known as: FLAGYL Take 1 tablet (500 mg total) by mouth 3 (three) times daily for 5 days.   ondansetron 4 MG tablet Commonly known as: ZOFRAN Take 1 tablet (4 mg total) by mouth every 6 (six) hours as needed for nausea.   onetouch ultrasoft lancets Use as instructed   OneTouch Verio test strip Generic drug: glucose blood 2 (two) times daily.   UNABLE TO FIND Blood glucose monitor kit check blood glucose twice daily dx: e11.65   UNABLE TO FIND Glucose test strips check blood glucose twice daily dx e11.65        Discharge Exam: Filed Weights   06/18/22 0644 06/19/22 0402 06/20/22 0542  Weight: 102.1 kg 102.9 kg 102.2 kg      Physical Exam:  General:  AAO x 3,  cooperative, no distress;   HEENT:  Normocephalic, PERRL, otherwise with in Normal limits   Neuro:  CNII-XII intact. , normal motor and sensation, reflexes intact   Lungs:   Clear to auscultation BL, Respirations unlabored,  No wheezes / crackles  Cardio:    S1/S2, RRR, No murmure, No Rubs or Gallops   Abdomen:  Soft, non-tender, bowel sounds active all four quadrants, no guarding or peritoneal signs.  Muscular  skeletal:  Limited exam -global generalized weaknesses - in bed, able to move all 4 extremities,   2+ pulses,  symmetric, No pitting edema  Skin:  Dry, warm to touch, negative for any Rashes,  Wounds: Please see nursing documentation          Condition at discharge: good  The results of significant diagnostics from this hospitalization (including imaging, microbiology, ancillary and laboratory) are listed below for reference.   Imaging Studies: CT Abdomen Pelvis Wo Contrast  Result Date: 06/16/2022 CLINICAL DATA:  Nausea and vomiting,  abdominal pain EXAM: CT ABDOMEN AND PELVIS WITHOUT CONTRAST TECHNIQUE: Multidetector CT imaging of the abdomen and pelvis was performed following the standard protocol without IV contrast. RADIATION DOSE REDUCTION: This exam was performed according to the departmental dose-optimization program which includes automated exposure control, adjustment of the mA and/or kV according to patient size and/or use of iterative reconstruction technique. COMPARISON:  05/01/2022 FINDINGS: Lower chest: No acute pleural or parenchymal lung disease. Hepatobiliary: Cholecystectomy. Unremarkable unenhanced appearance of the liver. Pancreas: Unremarkable unenhanced appearance. Spleen: Unremarkable unenhanced appearance. Adrenals/Urinary Tract: Punctate less than 2 mm nonobstructing left renal calculus. No right-sided calculi. No obstructive uropathy within either kidney. Bladder is decompressed, limiting its evaluation. Adrenals are unremarkable. Stomach/Bowel: No bowel obstruction or ileus. Normal appendix right lower quadrant. Apparent mild wall thickening of the duodenum and proximal jejunum which could reflect underlying enteritis. No inflammatory changes. Vascular/Lymphatic: No significant vascular findings are present. No enlarged abdominal or pelvic lymph nodes. Reproductive: Status post hysterectomy. No adnexal masses. Other: No free fluid or free intraperitoneal gas. No abdominal wall hernia. Musculoskeletal: No acute or destructive bony lesions. Reconstructed images demonstrate no additional findings. IMPRESSION: 1. Nonspecific wall thickening of the duodenum and proximal jejunum, which could reflect underlying enteritis. Number a shin limited without intravenous and oral contrast. 2. No bowel obstruction or ileus. 3. Punctate less than 2 mm nonobstructing left renal calculus. Electronically Signed   By: Randa Ngo M.D.   On: 06/16/2022 23:26   DG Chest Port 1 View  Result Date: 06/16/2022 CLINICAL DATA:  Fever, nausea  and vomiting for 3 days EXAM: PORTABLE CHEST 1 VIEW COMPARISON:  10/10/2015 FINDINGS: The heart size and mediastinal contours are within normal limits. Both lungs are clear. The visualized skeletal structures are unremarkable. IMPRESSION: No active disease. Electronically Signed   By: Randa Ngo M.D.   On: 06/16/2022 22:45    Microbiology: Results for orders placed or performed during the hospital encounter of 06/16/22  Resp Panel by RT-PCR (Flu A&B, Covid) Anterior Nasal Swab     Status: None   Collection Time: 06/16/22 10:16 PM   Specimen: Anterior Nasal Swab  Result Value Ref Range Status   SARS Coronavirus 2 by RT PCR NEGATIVE NEGATIVE Final    Comment: (NOTE) SARS-CoV-2 target nucleic acids are NOT DETECTED.  The SARS-CoV-2 RNA is generally detectable in upper respiratory specimens during the acute phase of infection. The lowest concentration of SARS-CoV-2 viral copies this assay can detect is 138  copies/mL. A negative result does not preclude SARS-Cov-2 infection and should not be used as the sole basis for treatment or other patient management decisions. A negative result may occur with  improper specimen collection/handling, submission of specimen other than nasopharyngeal swab, presence of viral mutation(s) within the areas targeted by this assay, and inadequate number of viral copies(<138 copies/mL). A negative result must be combined with clinical observations, patient history, and epidemiological information. The expected result is Negative.  Fact Sheet for Patients:  EntrepreneurPulse.com.au  Fact Sheet for Healthcare Providers:  IncredibleEmployment.be  This test is no t yet approved or cleared by the [place] FDA and  has been authorized for detection and/or diagnosis of SARS-CoV-2 by FDA under an Emergency Use Authorization (EUA). This EUA will remain  in effect (meaning this test can be used) for the duration of  the COVID-19 declaration under Section 564(b)(1) of the Act, 21 U.S.C.section 360bbb-3(b)(1), unless the authorization is terminated  or revoked sooner.       Influenza A by PCR NEGATIVE NEGATIVE Final   Influenza B by PCR NEGATIVE NEGATIVE Final    Comment: (NOTE) The Xpert Xpress SARS-CoV-2/FLU/RSV plus assay is intended as an aid in the diagnosis of influenza from Nasopharyngeal swab specimens and should not be used as a sole basis for treatment. Nasal washings and aspirates are unacceptable for Xpert Xpress SARS-CoV-2/FLU/RSV testing.  Fact Sheet for Patients: EntrepreneurPulse.com.au  Fact Sheet for Healthcare Providers: IncredibleEmployment.be  This test is not yet approved or cleared by the [place] FDA and has been authorized for detection and/or diagnosis of SARS-CoV-2 by FDA under an Emergency Use Authorization (EUA). This EUA will remain in effect (meaning this test can be used) for the duration of the COVID-19 declaration under Section 564(b)(1) of the Act, 21 U.S.C. section 360bbb-3(b)(1), unless the authorization is terminated or revoked.  Performed at [facility], [address], [place], [place] [address]   Culture, blood (routine x 2)     Status: None (Preliminary result)   Collection Time: 06/16/22 10:27 PM   Specimen: BLOOD  Result Value Ref Range Status   Specimen Description BLOOD BLOOD LEFT ARM  Final   Special Requests   Final    BOTTLES DRAWN AEROBIC AND ANAEROBIC Blood Culture adequate volume   Culture   Final    NO GROWTH 4 DAYS Performed at [facility], [address] [place], [place] [address]    Report Status PENDING  Incomplete  Culture, blood (routine x 2)     Status: None (Preliminary result)   Collection Time: 06/16/22 10:31 PM   Specimen: BLOOD  Result Value Ref Range Status   Specimen Description BLOOD BLOOD LEFT HAND  Final   Special Requests   Final    BOTTLES DRAWN AEROBIC AND ANAEROBIC  Blood Culture adequate volume   Culture   Final    NO GROWTH 4 DAYS Performed at [facility], [address] [place], [place] [address]    Report Status PENDING  Incomplete  Urine Culture     Status: Abnormal   Collection Time: 06/17/22  1:43 PM   Specimen: Urine, Clean Catch  Result Value Ref Range Status   Specimen Description   Final    URINE, CLEAN CATCH Performed at [facility], [address] [place], [place] [address]    Special Requests   Final    NONE Performed at [facility], [address], [place], [place] [address]    Culture (A)  Final    <  10,000 COLONIES/mL INSIGNIFICANT GROWTH Performed at [facility], [place] [address] [place], [place] [address]    Report Status 06/18/2022 FINAL  Final    Labs: CBC: Recent Labs  Lab 06/16/22 2205 06/17/22 0545 06/18/22 0440 06/19/22 0623 06/20/22 0603  WBC 21.7* 18.5* 14.9* 11.8* 10.8*  NEUTROABS  --  13.7*  --   --   --   HGB 13.8 13.0 12.0 10.2* 9.9*  HCT 43.0 41.1 38.0 32.6* 31.2*  MCV 67.4* 68.7* 68.7* 69.7* 68.6*  PLT 325 274 263 227 505   Basic Metabolic Panel: Recent Labs  Lab 06/16/22 2205 06/17/22 0545 06/18/22 0440 06/19/22 0623 06/20/22 0603  NA 127* 131* 133* 135 136  K 4.3 3.9 3.5 3.3* 3.2*  CL 92* 98 102 106 105  CO2 22 23 21* 22 23  GLUCOSE 381* 302* 159* 115* 99  BUN 29* 26* '16 10 8  ' CREATININE 1.54* 1.11* 0.79 0.66 0.68  CALCIUM 9.5 8.6* 8.7* 8.4* 8.6*  MG  --  1.7  --   --   --    Liver Function Tests: Recent Labs  Lab 06/16/22 2205 06/17/22 0545 06/18/22 0440 06/19/22 0623 06/20/22 0603  AST 14* 9* 11* 13* 29  ALT '15 11 13 14 25  ' ALKPHOS 73 61 53 45 46  BILITOT 0.6 0.4 0.6 0.8 0.8  PROT 9.7* 7.9 7.7 6.4* 6.7  ALBUMIN 4.2 3.5 3.5 3.0* 3.2*   CBG: Recent Labs  Lab 06/19/22 0708 06/19/22 1133 06/19/22 1642 06/19/22 2044 06/20/22 0718  GLUCAP 117* 154* 127* 121* 110*    Discharge time spent: greater than 30 minutes.  Signed: Deatra James, MD [facility] 06/20/2022

## 2022-06-20 NOTE — Progress Notes (Signed)
Nsg Discharge Note  Admit Date:  06/16/2022 Discharge date: 06/20/2022   Sharleen Deneen Harts to be D/C'd Home per MD order.  AVS completed.  Copy for chart, and copy for patient signed, and dated. Patient/caregiver able to verbalize understanding.  Discharge Medication: Allergies as of 06/20/2022       Reactions   Iohexol Swelling    Code: HIVES, Desc: PT STATES THE EYES SWOLL AND ITCHED, NEEDS PRE MEDS        Medication List     TAKE these medications    amLODipine 5 MG tablet Commonly known as: NORVASC Take 1 tablet (5 mg total) by mouth daily.   atorvastatin 10 MG tablet Commonly known as: LIPITOR Take 1 tablet (10 mg total) by mouth daily.   ciprofloxacin 500 MG tablet Commonly known as: Cipro Take 1 tablet (500 mg total) by mouth 2 (two) times daily for 5 days.   diclofenac Sodium 1 % Gel Commonly known as: VOLTAREN Apply 2 g topically daily as needed (pain).   glyBURIDE-metformin 5-500 MG tablet Commonly known as: GLUCOVANCE TAKE 1 TABLET BY MOUTH TWICE DAILY What changed:  how much to take how to take this when to take this additional instructions   lactobacillus acidophilus & bulgar chewable tablet Chew 1 tablet by mouth 3 (three) times daily with meals for 7 days.   lisinopril 20 MG tablet Commonly known as: ZESTRIL Take 1 tablet (20 mg total) by mouth daily.   metroNIDAZOLE 500 MG tablet Commonly known as: FLAGYL Take 1 tablet (500 mg total) by mouth 3 (three) times daily for 5 days.   ondansetron 4 MG tablet Commonly known as: ZOFRAN Take 1 tablet (4 mg total) by mouth every 6 (six) hours as needed for nausea.   onetouch ultrasoft lancets Use as instructed   OneTouch Verio test strip Generic drug: glucose blood 2 (two) times daily.   UNABLE TO FIND Blood glucose monitor kit check blood glucose twice daily dx: e11.65   UNABLE TO FIND Glucose test strips check blood glucose twice daily dx e11.65        Discharge Assessment: Vitals:    06/19/22 2046 06/20/22 0554  BP: (!) 155/67 (!) 147/74  Pulse: 85 87  Resp: 18 18  Temp: 98.3 F (36.8 C) 98.1 F (36.7 C)  SpO2: 100% 100%   Skin clean, dry and intact without evidence of skin break down, no evidence of skin tears noted. IV catheter discontinued intact. Site without signs and symptoms of complications - no redness or edema noted at insertion site, patient denies c/o pain - only slight tenderness at site.  Dressing with slight pressure applied.  D/c Instructions-Education: Discharge instructions given to patient/family with verbalized understanding. D/c education completed with patient/family including follow up instructions, medication list, d/c activities limitations if indicated, with other d/c instructions as indicated by MD - patient able to verbalize understanding, all questions fully answered. Patient instructed to return to ED, call 911, or call MD for any changes in condition.  Patient escorted via [place], and D/C home via private auto.  [place], [place], LPN 9/79/8921 19:41 AM

## 2022-06-21 LAB — CULTURE, BLOOD (ROUTINE X 2)
Culture: NO GROWTH
Culture: NO GROWTH
Special Requests: ADEQUATE
Special Requests: ADEQUATE

## 2022-07-07 ENCOUNTER — Ambulatory Visit: Payer: 59 | Admitting: Nurse Practitioner

## 2022-08-02 ENCOUNTER — Emergency Department (HOSPITAL_COMMUNITY): Payer: Medicare Other

## 2022-08-02 ENCOUNTER — Other Ambulatory Visit: Payer: Self-pay

## 2022-08-02 ENCOUNTER — Encounter (HOSPITAL_COMMUNITY): Payer: Self-pay

## 2022-08-02 ENCOUNTER — Inpatient Hospital Stay (HOSPITAL_COMMUNITY)
Admission: EM | Admit: 2022-08-02 | Discharge: 2022-08-04 | DRG: 683 | Disposition: A | Discharge status: Home / Self Care | Payer: Medicare Other | Attending: Internal Medicine | Admitting: Internal Medicine

## 2022-08-02 DIAGNOSIS — E872 Acidosis, unspecified: Secondary | ICD-10-CM | POA: Diagnosis present

## 2022-08-02 DIAGNOSIS — E871 Hypo-osmolality and hyponatremia: Secondary | ICD-10-CM | POA: Diagnosis present

## 2022-08-02 DIAGNOSIS — N179 Acute kidney failure, unspecified: Secondary | ICD-10-CM | POA: Diagnosis not present

## 2022-08-02 DIAGNOSIS — Z90710 Acquired absence of both cervix and uterus: Secondary | ICD-10-CM

## 2022-08-02 DIAGNOSIS — Z833 Family history of diabetes mellitus: Secondary | ICD-10-CM

## 2022-08-02 DIAGNOSIS — K760 Fatty (change of) liver, not elsewhere classified: Secondary | ICD-10-CM | POA: Diagnosis present

## 2022-08-02 DIAGNOSIS — I10 Essential (primary) hypertension: Secondary | ICD-10-CM | POA: Diagnosis present

## 2022-08-02 DIAGNOSIS — R112 Nausea with vomiting, unspecified: Secondary | ICD-10-CM

## 2022-08-02 DIAGNOSIS — Z800 Family history of malignant neoplasm of digestive organs: Secondary | ICD-10-CM

## 2022-08-02 DIAGNOSIS — Z6832 Body mass index (BMI) 32.0-32.9, adult: Secondary | ICD-10-CM

## 2022-08-02 DIAGNOSIS — Z823 Family history of stroke: Secondary | ICD-10-CM

## 2022-08-02 DIAGNOSIS — E876 Hypokalemia: Secondary | ICD-10-CM

## 2022-08-02 DIAGNOSIS — E785 Hyperlipidemia, unspecified: Secondary | ICD-10-CM | POA: Diagnosis present

## 2022-08-02 DIAGNOSIS — Z801 Family history of malignant neoplasm of trachea, bronchus and lung: Secondary | ICD-10-CM

## 2022-08-02 DIAGNOSIS — R6510 Systemic inflammatory response syndrome (SIRS) of non-infectious origin without acute organ dysfunction: Secondary | ICD-10-CM

## 2022-08-02 DIAGNOSIS — E1165 Type 2 diabetes mellitus with hyperglycemia: Secondary | ICD-10-CM | POA: Diagnosis present

## 2022-08-02 DIAGNOSIS — D509 Iron deficiency anemia, unspecified: Secondary | ICD-10-CM | POA: Diagnosis present

## 2022-08-02 DIAGNOSIS — Z20822 Contact with and (suspected) exposure to covid-19: Secondary | ICD-10-CM | POA: Diagnosis present

## 2022-08-02 DIAGNOSIS — Z79899 Other long term (current) drug therapy: Secondary | ICD-10-CM

## 2022-08-02 DIAGNOSIS — E782 Mixed hyperlipidemia: Secondary | ICD-10-CM | POA: Diagnosis present

## 2022-08-02 DIAGNOSIS — A049 Bacterial intestinal infection, unspecified: Secondary | ICD-10-CM | POA: Diagnosis present

## 2022-08-02 DIAGNOSIS — K529 Noninfective gastroenteritis and colitis, unspecified: Secondary | ICD-10-CM | POA: Diagnosis present

## 2022-08-02 DIAGNOSIS — R197 Diarrhea, unspecified: Secondary | ICD-10-CM | POA: Diagnosis present

## 2022-08-02 DIAGNOSIS — Z66 Do not resuscitate: Secondary | ICD-10-CM | POA: Diagnosis present

## 2022-08-02 DIAGNOSIS — R1110 Vomiting, unspecified: Secondary | ICD-10-CM | POA: Diagnosis present

## 2022-08-02 DIAGNOSIS — Z87891 Personal history of nicotine dependence: Secondary | ICD-10-CM

## 2022-08-02 DIAGNOSIS — E669 Obesity, unspecified: Secondary | ICD-10-CM | POA: Diagnosis present

## 2022-08-02 DIAGNOSIS — Z7984 Long term (current) use of oral hypoglycemic drugs: Secondary | ICD-10-CM

## 2022-08-02 DIAGNOSIS — I160 Hypertensive urgency: Secondary | ICD-10-CM

## 2022-08-02 DIAGNOSIS — R109 Unspecified abdominal pain: Secondary | ICD-10-CM | POA: Diagnosis present

## 2022-08-02 LAB — COMPREHENSIVE METABOLIC PANEL
ALT: 15 U/L (ref 0–44)
AST: 17 U/L (ref 15–41)
Albumin: 4.4 g/dL (ref 3.5–5.0)
Alkaline Phosphatase: 85 U/L (ref 38–126)
Anion gap: 17 — ABNORMAL HIGH (ref 5–15)
BUN: 24 mg/dL — ABNORMAL HIGH (ref 6–20)
CO2: 25 mmol/L (ref 22–32)
Calcium: 9.7 mg/dL (ref 8.9–10.3)
Chloride: 87 mmol/L — ABNORMAL LOW (ref 98–111)
Creatinine, Ser: 1.5 mg/dL — ABNORMAL HIGH (ref 0.44–1.00)
GFR, Estimated: 40 mL/min — ABNORMAL LOW (ref >60)
Glucose, Bld: 456 mg/dL — ABNORMAL HIGH (ref 70–99)
Potassium: 3.2 mmol/L — ABNORMAL LOW (ref 3.5–5.1)
Sodium: 129 mmol/L — ABNORMAL LOW (ref 135–145)
Total Bilirubin: 0.9 mg/dL (ref 0.3–1.2)
Total Protein: 9.9 g/dL — ABNORMAL HIGH (ref 6.5–8.1)

## 2022-08-02 LAB — RESP PANEL BY RT-PCR (FLU A&B, COVID) ARPGX2
Influenza A by PCR: NEGATIVE
Influenza B by PCR: NEGATIVE
SARS Coronavirus 2 by RT PCR: NEGATIVE

## 2022-08-02 LAB — URINALYSIS, ROUTINE W REFLEX MICROSCOPIC
Bilirubin Urine: NEGATIVE
Glucose, UA: 150 mg/dL — AB
Hgb urine dipstick: NEGATIVE
Ketones, ur: 5 mg/dL — AB
Leukocytes,Ua: NEGATIVE
Nitrite: NEGATIVE
Protein, ur: >=300 mg/dL — AB
Specific Gravity, Urine: 1.035 — ABNORMAL HIGH (ref 1.005–1.030)
pH: 5 (ref 5.0–8.0)

## 2022-08-02 LAB — CBC WITH DIFFERENTIAL/PLATELET
Abs Immature Granulocytes: 0.13 10*3/uL — ABNORMAL HIGH (ref 0.00–0.07)
Basophils Absolute: 0.1 10*3/uL (ref 0.0–0.1)
Basophils Relative: 0 %
Eosinophils Absolute: 0 10*3/uL (ref 0.0–0.5)
Eosinophils Relative: 0 %
HCT: 45.3 % (ref 36.0–46.0)
Hemoglobin: 14.4 g/dL (ref 12.0–15.0)
Immature Granulocytes: 1 %
Lymphocytes Relative: 9 %
Lymphs Abs: 2 10*3/uL (ref 0.7–4.0)
MCH: 22 pg — ABNORMAL LOW (ref 26.0–34.0)
MCHC: 31.8 g/dL (ref 30.0–36.0)
MCV: 69.1 fL — ABNORMAL LOW (ref 80.0–100.0)
Monocytes Absolute: 0.7 10*3/uL (ref 0.1–1.0)
Monocytes Relative: 3 %
Neutro Abs: 18.5 10*3/uL — ABNORMAL HIGH (ref 1.7–7.7)
Neutrophils Relative %: 87 %
Platelets: 371 10*3/uL (ref 150–400)
RBC: 6.56 MIL/uL — ABNORMAL HIGH (ref 3.87–5.11)
RDW: 18 % — ABNORMAL HIGH (ref 11.5–15.5)
WBC: 21.4 10*3/uL — ABNORMAL HIGH (ref 4.0–10.5)
nRBC: 0 % (ref 0.0–0.2)

## 2022-08-02 LAB — BLOOD GAS, VENOUS
Acid-Base Excess: 6 mmol/L — ABNORMAL HIGH (ref 0.0–2.0)
Bicarbonate: 30.6 mmol/L — ABNORMAL HIGH (ref 20.0–28.0)
Drawn by: 442
O2 Saturation: 61.9 %
Patient temperature: 36.5
pCO2, Ven: 42 mmHg — ABNORMAL LOW (ref 44–60)
pH, Ven: 7.47 — ABNORMAL HIGH (ref 7.25–7.43)
pO2, Ven: 34 mmHg (ref 32–45)

## 2022-08-02 LAB — LIPASE, BLOOD: Lipase: 48 U/L (ref 11–51)

## 2022-08-02 LAB — SARS CORONAVIRUS 2 BY RT PCR: SARS Coronavirus 2 by RT PCR: NEGATIVE

## 2022-08-02 LAB — LACTIC ACID, PLASMA
Lactic Acid, Venous: 4.5 mmol/L (ref 0.5–1.9)
Lactic Acid, Venous: 2.9 mmol/L (ref 0.5–1.9)

## 2022-08-02 LAB — TSH: TSH: 2.611 u[IU]/mL (ref 0.350–4.500)

## 2022-08-02 LAB — CBG MONITORING, ED: Glucose-Capillary: 445 mg/dL — ABNORMAL HIGH (ref 70–99)

## 2022-08-02 MED ORDER — METHYLPREDNISOLONE SODIUM SUCC 40 MG IJ SOLR
40.0000 mg | Freq: Once | INTRAMUSCULAR | Status: AC
  Administered 2022-08-02: 40 mg via INTRAVENOUS
  Filled 2022-08-02: qty 1

## 2022-08-02 MED ORDER — LACTATED RINGERS IV BOLUS
1000.0000 mL | Freq: Once | INTRAVENOUS | Status: AC
  Administered 2022-08-02: 1000 mL via INTRAVENOUS

## 2022-08-02 MED ORDER — ONDANSETRON HCL 4 MG/2ML IJ SOLN
4.0000 mg | Freq: Once | INTRAMUSCULAR | Status: AC
  Administered 2022-08-02: 4 mg via INTRAVENOUS
  Filled 2022-08-02: qty 2

## 2022-08-02 MED ORDER — DIPHENHYDRAMINE HCL 25 MG PO CAPS: 50.0000 mg | ORAL_CAPSULE | Freq: Once | ORAL | Status: AC

## 2022-08-02 MED ORDER — VANCOMYCIN HCL 1500 MG/300ML IV SOLN
1500.0000 mg | Freq: Once | INTRAVENOUS | Status: AC
Start: 2022-08-02 — End: 2022-08-02
  Administered 2022-08-02: 1500 mg via INTRAVENOUS
  Filled 2022-08-02: qty 300

## 2022-08-02 MED ORDER — DIPHENHYDRAMINE HCL 50 MG/ML IJ SOLN
50.0000 mg | Freq: Once | INTRAMUSCULAR | Status: AC
  Administered 2022-08-02: 50 mg via INTRAVENOUS
  Filled 2022-08-02: qty 1

## 2022-08-02 MED ORDER — SODIUM CHLORIDE 0.9 % IV SOLN
2.0000 g | Freq: Once | INTRAVENOUS | Status: AC
  Administered 2022-08-02: 2 g via INTRAVENOUS
  Filled 2022-08-02: qty 12.5

## 2022-08-02 MED ORDER — IOHEXOL 350 MG/ML SOLN
75.0000 mL | Freq: Once | INTRAVENOUS | Status: AC | PRN
  Administered 2022-08-02: 75 mL via INTRAVENOUS

## 2022-08-02 NOTE — ED Provider Notes (Signed)
[facility] EMERGENCY DEPARTMENT Provider Note   CSN: [id number] Arrival date & time: 08/02/22  1702     History  Chief Complaint  Patient presents with   Emesis    Shawna Huerta is a 61 y.o. female.  Shawna Huerta is a 61 y.o. female with a history of diabetes, hypertension, anemia, pancreatitis, who presents to the emergency department for evaluation of fevers, chills, body aches, nausea, vomiting, diarrhea, cough.  Symptoms started on Sunday.  Fever of 101 Sunday at home, did take Tylenol prior to arrival.  She reports she has been feeling ill, generally malaised and fatigued.  Has had numerous episodes of vomiting including 4 episodes today, no hematemesis.  Has tried to stay hydrated and keep down some food but has had difficulty with this.  Had low blood sugar yesterday morning at 46 and had to take a glucose tablet.  Has tried to continue to take her medications but has had difficulty keeping this down.  Had some diarrhea on Sunday but none since then, no blood in the stool.  Reports some mild generalized abdominal tenderness.  Intermittent productive cough and some congestion.  No chest pain or shortness of breath.  Unsure of sick contacts.  No other aggravating or alleviating factors.  The history is provided by the patient and medical records.  Emesis Associated symptoms: abdominal pain, chills, cough, diarrhea, fever and myalgias   Associated symptoms: no arthralgias and no headaches        Home Medications Prior to Admission medications   Medication Sig Start Date End Date Taking? Authorizing Provider  amLODipine (NORVASC) 5 MG tablet Take 1 tablet (5 mg total) by mouth daily. 06/20/22 07/20/22  Deatra James, MD  atorvastatin (LIPITOR) 10 MG tablet Take 1 tablet (10 mg total) by mouth daily. 02/18/22   Renee Rival, FNP  diclofenac Sodium (VOLTAREN) 1 % GEL Apply 2 g topically daily as needed (pain).    [provider]  glyBURIDE-metformin (GLUCOVANCE)  5-500 MG tablet TAKE 1 TABLET BY MOUTH TWICE DAILY Patient taking differently: Take 1 tablet by mouth 2 (two) times daily with a meal. 06/16/22   Paseda, Dewaine Conger, FNP  Lancets (ONETOUCH ULTRASOFT) lancets Use as instructed 04/13/22   Paseda, Dewaine Conger, FNP  lisinopril (ZESTRIL) 20 MG tablet Take 1 tablet (20 mg total) by mouth daily. 02/17/22   Paseda, Dewaine Conger, FNP  ondansetron (ZOFRAN) 4 MG tablet Take 1 tablet (4 mg total) by mouth every 6 (six) hours as needed for nausea. 06/20/22   Shahmehdi, Valeria Batman, MD  [facility] VERIO test strip 2 (two) times daily. 04/12/22   [provider]  UNABLE TO FIND Blood glucose monitor kit check blood glucose twice daily dx: e11.65 04/12/22   Renee Rival, FNP  UNABLE TO FIND Glucose test strips check blood glucose twice daily dx e11.65 04/12/22   Renee Rival, FNP      Allergies    Iohexol    Review of Systems   Review of Systems  Constitutional:  Positive for chills and fever.  HENT:  Positive for congestion and rhinorrhea.   Respiratory:  Positive for cough. Negative for shortness of breath.   Cardiovascular:  Negative for chest pain.  Gastrointestinal:  Positive for abdominal pain, diarrhea, nausea and vomiting. Negative for blood in stool and constipation.  Genitourinary:  Negative for dysuria and frequency.  Musculoskeletal:  Positive for myalgias. Negative for arthralgias.  Skin:  Negative for color change and rash.  Neurological:  Negative for dizziness, syncope, light-headedness and headaches.    Physical Exam Updated Vital Signs BP 133/83 (BP Location: Right Arm)   Pulse (!) 157   Temp 97.8 F (36.6 C) (Oral)   Resp 18   Ht '5\' 7"'  (1.702 m)   Wt 95.3 kg   SpO2 100%   BMI 32.89 kg/m  Physical Exam Vitals and nursing note reviewed.  Constitutional:      General: She is not in acute distress.    Appearance: Normal appearance. She is well-developed. She is ill-appearing and diaphoretic.     Comments: Alert and  mentating well but ill-appearing and somewhat diaphoretic  HENT:     Head: Normocephalic and atraumatic.     Mouth/Throat:     Mouth: Mucous membranes are moist.     Pharynx: Oropharynx is clear.  Eyes:     General:        Right eye: No discharge.        Left eye: No discharge.  Cardiovascular:     Rate and Rhythm: Regular rhythm. Tachycardia present.     Pulses: Normal pulses.     Heart sounds: Normal heart sounds.  Pulmonary:     Effort: Pulmonary effort is normal. No respiratory distress.     Breath sounds: Normal breath sounds.     Comments: Respirations equal and unlabored, patient able to speak in full sentences, lungs clear to auscultation bilaterally  Chest:     Chest wall: No tenderness.  Abdominal:     General: Bowel sounds are normal. There is no distension.     Palpations: Abdomen is soft.     Tenderness: There is abdominal tenderness.     Comments: Abdomen is soft, nondistended, bowel sounds present throughout, there is mild generalized tenderness throughout the abdomen that does not localize to 1 quadrant, no guarding or peritoneal signs  Musculoskeletal:        General: No deformity.     Right lower leg: No edema.     Left lower leg: No edema.  Skin:    General: Skin is warm.  Neurological:     Mental Status: She is alert and oriented to person, place, and time.     Coordination: Coordination normal.     Comments: Speech is clear, able to follow commands Moves extremities without ataxia, coordination intact  Psychiatric:        Mood and Affect: Mood normal.        Behavior: Behavior normal.     ED Results / Procedures / Treatments   Labs (all labs ordered are listed, but only abnormal results are displayed) Labs Reviewed  CBG MONITORING, ED - Abnormal; Notable for the following components:      Result Value   Glucose-Capillary 445 (*)    All other components within normal limits  RESP PANEL BY RT-PCR (FLU A&B, COVID) ARPGX2  CULTURE, BLOOD (ROUTINE X  2)  CULTURE, BLOOD (ROUTINE X 2)  URINE CULTURE  COMPREHENSIVE METABOLIC PANEL  CBC WITH DIFFERENTIAL/PLATELET  LACTIC ACID, PLASMA  LACTIC ACID, PLASMA  URINALYSIS, ROUTINE W REFLEX MICROSCOPIC  TSH  BLOOD GAS, VENOUS  LIPASE, BLOOD    EKG None  Radiology DG Chest Port 1 View  Result Date: 08/02/2022 CLINICAL DATA:  Cough EXAM: PORTABLE CHEST 1 VIEW COMPARISON:  06/16/2022 FINDINGS: The heart size and mediastinal contours are within normal limits. Both lungs are clear. The visualized skeletal structures are unremarkable. IMPRESSION: No active disease. Electronically Signed   By: Royston Cowper  Rathinasamy M.D.   On: 08/02/2022 17:59    Procedures Procedures    Medications Ordered in ED Medications  lactated ringers bolus 1,000 mL (1,000 mLs Intravenous New Bag/Given 08/02/22 1802)  ondansetron (ZOFRAN) injection 4 mg (4 mg Intravenous Given 08/02/22 1803)    ED Course/ Medical Decision Making/ A&P                          Medical Decision Making Amount and/or Complexity of Data Reviewed Labs: ordered. Radiology: ordered.  Risk Prescription drug management.   Shawna Huerta is a 61 y.o. female presents to the ED for concern of tachycardia, fevers, chills, vomiting, diarrhea, cough, generalized abdominal pain, this involves an extensive number of treatment options, and is a complaint that carries with it a high risk of complications and morbidity.  The differential diagnosis includes viral gastroenteritis, dehydration, DKA, COVID, other viral illness, hypothyroidism, pancreatitis, appendicitis, diverticulitis   Additional history obtained:  Additional history obtained from chart review External records from outside source obtained and reviewed including prior ED visits and outpatient follow-up including ED visit with similar presentation in July for viral gastroenteritis and dehydration  EKG: Sinus tachycardia with rate of 139, mild QT prolongation   Lab Tests:  I Ordered,  reviewed, and interpreted labs.  The pertinent results include:  glucose 445, UA without signs of infection, minimal ketones, much lower suspicion for DKA.  Patient's lactate returned significantly elevated at 4.5, while I suspect that this is primarily due to dehydration this also raises concern for potential sepsis but also mesenteric ischemia.  The rest of patient's lab work is pending   Imaging Studies ordered:  I ordered imaging studies including chest x-ray, CTA abdomen pelvis I independently visualized and interpreted imaging which showed no pneumonia or other active cardiopulmomary disease.  CTA of the abdomen pelvis pending I agree with the radiologist interpretation   Cardiac Monitoring:  The patient was maintained on a cardiac monitor.  I personally viewed and interpreted the cardiac monitored which showed an underlying rhythm of: sinus tachycardia   Medicines ordered and prescription drug management:  I ordered medication including IV fluid bolus and zofran  for nausea  I have reviewed the patients home medicines and have made adjustments as needed    ED Course:  Patient arrives with 3 days of general malaise, chills, nausea, vomiting and generalized abdominal pain as well as some cough.  On arrival she is significantly tachycardic, sinus tachycardia on EKG.  Suspect this is most likely due to dehydration, currently not febrile but does report fevers at home, this and GI losses can both be driving tachycardia.  Patient given IV fluid bolus and Zofran. Infectious lab work-up, chest x-ray, abdominal imaging and COVID test ordered. Tachycardia improving with IV fluids.  No further vomiting here in the emergency department.  Significantly elevated lactic acid prompting CTA for evaluation of possible mesenteric ischemia. At shift change care signed out to Dr. Oswald Hillock who will follow up on pending studies and reevaluate patient, anticipate admission          Final  Clinical Impression(s) / ED Diagnoses Final diagnoses:  None    Rx / DC Orders ED Discharge Orders     None         Janet Berlin 08/02/22 Yevette Edwards    Tretha Sciara, MD 08/03/22 1442

## 2022-08-02 NOTE — ED Triage Notes (Signed)
Pt to er, pt states that she is here for body aches, vomiting and nausea, states that she started feeling ill on Sunday.  Pt denies diarrhea.  Pt states that she has vomited 4 times today

## 2022-08-03 DIAGNOSIS — E785 Hyperlipidemia, unspecified: Secondary | ICD-10-CM

## 2022-08-03 DIAGNOSIS — R112 Nausea with vomiting, unspecified: Secondary | ICD-10-CM

## 2022-08-03 DIAGNOSIS — K760 Fatty (change of) liver, not elsewhere classified: Secondary | ICD-10-CM | POA: Diagnosis present

## 2022-08-03 DIAGNOSIS — E871 Hypo-osmolality and hyponatremia: Secondary | ICD-10-CM

## 2022-08-03 DIAGNOSIS — E669 Obesity, unspecified: Secondary | ICD-10-CM | POA: Diagnosis present

## 2022-08-03 DIAGNOSIS — Z90710 Acquired absence of both cervix and uterus: Secondary | ICD-10-CM | POA: Diagnosis not present

## 2022-08-03 DIAGNOSIS — E1165 Type 2 diabetes mellitus with hyperglycemia: Secondary | ICD-10-CM

## 2022-08-03 DIAGNOSIS — A049 Bacterial intestinal infection, unspecified: Secondary | ICD-10-CM | POA: Diagnosis present

## 2022-08-03 DIAGNOSIS — Z79899 Other long term (current) drug therapy: Secondary | ICD-10-CM | POA: Diagnosis not present

## 2022-08-03 DIAGNOSIS — Z833 Family history of diabetes mellitus: Secondary | ICD-10-CM | POA: Diagnosis not present

## 2022-08-03 DIAGNOSIS — R6510 Systemic inflammatory response syndrome (SIRS) of non-infectious origin without acute organ dysfunction: Secondary | ICD-10-CM | POA: Diagnosis present

## 2022-08-03 DIAGNOSIS — K529 Noninfective gastroenteritis and colitis, unspecified: Secondary | ICD-10-CM | POA: Diagnosis present

## 2022-08-03 DIAGNOSIS — E876 Hypokalemia: Secondary | ICD-10-CM | POA: Diagnosis present

## 2022-08-03 DIAGNOSIS — Z800 Family history of malignant neoplasm of digestive organs: Secondary | ICD-10-CM | POA: Diagnosis not present

## 2022-08-03 DIAGNOSIS — Z801 Family history of malignant neoplasm of trachea, bronchus and lung: Secondary | ICD-10-CM | POA: Diagnosis not present

## 2022-08-03 DIAGNOSIS — Z823 Family history of stroke: Secondary | ICD-10-CM | POA: Diagnosis not present

## 2022-08-03 DIAGNOSIS — D509 Iron deficiency anemia, unspecified: Secondary | ICD-10-CM | POA: Diagnosis present

## 2022-08-03 DIAGNOSIS — I160 Hypertensive urgency: Secondary | ICD-10-CM | POA: Diagnosis present

## 2022-08-03 DIAGNOSIS — N179 Acute kidney failure, unspecified: Secondary | ICD-10-CM | POA: Diagnosis present

## 2022-08-03 DIAGNOSIS — R197 Diarrhea, unspecified: Secondary | ICD-10-CM

## 2022-08-03 DIAGNOSIS — E872 Acidosis, unspecified: Secondary | ICD-10-CM | POA: Diagnosis present

## 2022-08-03 DIAGNOSIS — Z6832 Body mass index (BMI) 32.0-32.9, adult: Secondary | ICD-10-CM | POA: Diagnosis not present

## 2022-08-03 DIAGNOSIS — Z87891 Personal history of nicotine dependence: Secondary | ICD-10-CM | POA: Diagnosis not present

## 2022-08-03 DIAGNOSIS — R1084 Generalized abdominal pain: Secondary | ICD-10-CM | POA: Diagnosis not present

## 2022-08-03 DIAGNOSIS — Z7984 Long term (current) use of oral hypoglycemic drugs: Secondary | ICD-10-CM | POA: Diagnosis not present

## 2022-08-03 DIAGNOSIS — Z20822 Contact with and (suspected) exposure to covid-19: Secondary | ICD-10-CM | POA: Diagnosis present

## 2022-08-03 DIAGNOSIS — I10 Essential (primary) hypertension: Secondary | ICD-10-CM | POA: Diagnosis present

## 2022-08-03 DIAGNOSIS — Z66 Do not resuscitate: Secondary | ICD-10-CM | POA: Diagnosis present

## 2022-08-03 DIAGNOSIS — E782 Mixed hyperlipidemia: Secondary | ICD-10-CM | POA: Diagnosis present

## 2022-08-03 LAB — APTT: aPTT: 28 seconds (ref 24–36)

## 2022-08-03 LAB — COMPREHENSIVE METABOLIC PANEL
ALT: 13 U/L (ref 0–44)
AST: 9 U/L — ABNORMAL LOW (ref 15–41)
Albumin: 3.6 g/dL (ref 3.5–5.0)
Alkaline Phosphatase: 71 U/L (ref 38–126)
Anion gap: 10 (ref 5–15)
BUN: 20 mg/dL (ref 6–20)
CO2: 26 mmol/L (ref 22–32)
Calcium: 9 mg/dL (ref 8.9–10.3)
Chloride: 93 mmol/L — ABNORMAL LOW (ref 98–111)
Creatinine, Ser: 0.94 mg/dL (ref 0.44–1.00)
GFR, Estimated: >60 mL/min (ref >60)
Glucose, Bld: 366 mg/dL — ABNORMAL HIGH (ref 70–99)
Potassium: 3.1 mmol/L — ABNORMAL LOW (ref 3.5–5.1)
Sodium: 129 mmol/L — ABNORMAL LOW (ref 135–145)
Total Bilirubin: 0.7 mg/dL (ref 0.3–1.2)
Total Protein: 8.2 g/dL — ABNORMAL HIGH (ref 6.5–8.1)

## 2022-08-03 LAB — CBC
HCT: 41.1 % (ref 36.0–46.0)
Hemoglobin: 13.3 g/dL (ref 12.0–15.0)
MCH: 22 pg — ABNORMAL LOW (ref 26.0–34.0)
MCHC: 32.4 g/dL (ref 30.0–36.0)
MCV: 67.9 fL — ABNORMAL LOW (ref 80.0–100.0)
Platelets: 304 10*3/uL (ref 150–400)
RBC: 6.05 MIL/uL — ABNORMAL HIGH (ref 3.87–5.11)
RDW: 17.7 % — ABNORMAL HIGH (ref 11.5–15.5)
WBC: 19.4 10*3/uL — ABNORMAL HIGH (ref 4.0–10.5)
nRBC: 0 % (ref 0.0–0.2)

## 2022-08-03 LAB — GLUCOSE, CAPILLARY
Glucose-Capillary: 333 mg/dL — ABNORMAL HIGH (ref 70–99)
Glucose-Capillary: 304 mg/dL — ABNORMAL HIGH (ref 70–99)
Glucose-Capillary: 162 mg/dL — ABNORMAL HIGH (ref 70–99)
Glucose-Capillary: 245 mg/dL — ABNORMAL HIGH (ref 70–99)

## 2022-08-03 LAB — IRON AND TIBC
Iron: 71 ug/dL (ref 28–170)
Saturation Ratios: 31 % (ref 10.4–31.8)
TIBC: 233 ug/dL — ABNORMAL LOW (ref 250–450)
UIBC: 162 ug/dL

## 2022-08-03 LAB — MAGNESIUM: Magnesium: 1.7 mg/dL (ref 1.7–2.4)

## 2022-08-03 LAB — URINE CULTURE: Culture: <10000 — AB

## 2022-08-03 LAB — LACTIC ACID, PLASMA
Lactic Acid, Venous: 1.8 mmol/L (ref 0.5–1.9)
Lactic Acid, Venous: 1.7 mmol/L (ref 0.5–1.9)

## 2022-08-03 LAB — PHOSPHORUS: Phosphorus: 2.1 mg/dL — ABNORMAL LOW (ref 2.5–4.6)

## 2022-08-03 LAB — CBG MONITORING, ED: Glucose-Capillary: 391 mg/dL — ABNORMAL HIGH (ref 70–99)

## 2022-08-03 LAB — FERRITIN: Ferritin: 359 ng/mL — ABNORMAL HIGH (ref 11–307)

## 2022-08-03 MED ORDER — SODIUM CHLORIDE 0.9 % IV SOLN: INTRAVENOUS | Status: AC

## 2022-08-03 MED ORDER — MORPHINE SULFATE (PF) 2 MG/ML IV SOLN
2.0000 mg | INTRAVENOUS | Status: DC | PRN
  Administered 2022-08-03 – 2022-08-04 (×2): 2 mg via INTRAVENOUS
  Filled 2022-08-03 (×2): qty 1

## 2022-08-03 MED ORDER — ACETAMINOPHEN 650 MG RE SUPP: 650.0000 mg | Freq: Four times a day (QID) | RECTAL | Status: DC | PRN

## 2022-08-03 MED ORDER — LACTATED RINGERS IV BOLUS
1000.0000 mL | Freq: Once | INTRAVENOUS | Status: AC
  Administered 2022-08-03: 1000 mL via INTRAVENOUS

## 2022-08-03 MED ORDER — INSULIN ASPART 100 UNIT/ML IJ SOLN
0.0000 [IU] | Freq: Every day | INTRAMUSCULAR | Status: DC
  Administered 2022-08-03: 5 [IU] via SUBCUTANEOUS
  Filled 2022-08-03: qty 1

## 2022-08-03 MED ORDER — INSULIN ASPART 100 UNIT/ML IJ SOLN
0.0000 [IU] | Freq: Every day | INTRAMUSCULAR | Status: DC
  Administered 2022-08-03: 2 [IU] via SUBCUTANEOUS

## 2022-08-03 MED ORDER — INSULIN ASPART 100 UNIT/ML IJ SOLN
0.0000 [IU] | Freq: Three times a day (TID) | INTRAMUSCULAR | Status: DC
  Administered 2022-08-03: 11 [IU] via SUBCUTANEOUS
  Administered 2022-08-03: 3 [IU] via SUBCUTANEOUS
  Administered 2022-08-04 (×2): 5 [IU] via SUBCUTANEOUS

## 2022-08-03 MED ORDER — ACETAMINOPHEN 325 MG PO TABS: 650.0000 mg | ORAL_TABLET | Freq: Four times a day (QID) | ORAL | Status: DC | PRN

## 2022-08-03 MED ORDER — MORPHINE SULFATE (PF) 4 MG/ML IV SOLN
4.0000 mg | Freq: Once | INTRAVENOUS | Status: AC
  Administered 2022-08-03: 4 mg via INTRAVENOUS
  Filled 2022-08-03: qty 1

## 2022-08-03 MED ORDER — PROCHLORPERAZINE EDISYLATE 10 MG/2ML IJ SOLN
10.0000 mg | Freq: Four times a day (QID) | INTRAMUSCULAR | Status: DC | PRN
  Administered 2022-08-03: 10 mg via INTRAVENOUS
  Filled 2022-08-03: qty 2

## 2022-08-03 MED ORDER — ATORVASTATIN CALCIUM 10 MG PO TABS
10.0000 mg | ORAL_TABLET | Freq: Every day | ORAL | Status: DC
  Administered 2022-08-03 – 2022-08-04 (×2): 10 mg via ORAL
  Filled 2022-08-03 (×2): qty 1

## 2022-08-03 MED ORDER — POTASSIUM CHLORIDE CRYS ER 20 MEQ PO TBCR
40.0000 meq | EXTENDED_RELEASE_TABLET | Freq: Once | ORAL | Status: AC
Start: 2022-08-03 — End: 2022-08-03
  Administered 2022-08-03: 40 meq via ORAL
  Filled 2022-08-03: qty 2

## 2022-08-03 MED ORDER — INSULIN GLARGINE-YFGN 100 UNIT/ML ~~LOC~~ SOLN
10.0000 [IU] | Freq: Every day | SUBCUTANEOUS | Status: DC
  Administered 2022-08-03 – 2022-08-04 (×2): 10 [IU] via SUBCUTANEOUS
  Filled 2022-08-03 (×3): qty 0.1

## 2022-08-03 MED ORDER — HYDRALAZINE HCL 20 MG/ML IJ SOLN
10.0000 mg | Freq: Once | INTRAMUSCULAR | Status: AC
  Administered 2022-08-03: 10 mg via INTRAVENOUS
  Filled 2022-08-03: qty 1

## 2022-08-03 MED ORDER — SODIUM CHLORIDE 0.9 % IV SOLN
2.0000 g | Freq: Three times a day (TID) | INTRAVENOUS | Status: DC
  Administered 2022-08-03 – 2022-08-04 (×4): 2 g via INTRAVENOUS
  Filled 2022-08-03 (×4): qty 12.5

## 2022-08-03 MED ORDER — LABETALOL HCL 5 MG/ML IV SOLN
5.0000 mg | Freq: Four times a day (QID) | INTRAVENOUS | Status: DC | PRN
  Administered 2022-08-03 – 2022-08-04 (×2): 5 mg via INTRAVENOUS
  Filled 2022-08-03 (×2): qty 4

## 2022-08-03 MED ORDER — HYDRALAZINE HCL 20 MG/ML IJ SOLN
10.0000 mg | INTRAMUSCULAR | Status: DC | PRN
Start: 2022-08-03 — End: 2022-08-04

## 2022-08-03 MED ORDER — POTASSIUM CHLORIDE 10 MEQ/100ML IV SOLN
10.0000 meq | INTRAVENOUS | Status: AC
  Administered 2022-08-03 (×3): 10 meq via INTRAVENOUS
  Filled 2022-08-03 (×3): qty 100

## 2022-08-03 MED ORDER — SODIUM CHLORIDE 0.9 % IV SOLN
INTRAVENOUS | Status: DC
Start: 2022-08-03 — End: 2022-08-04

## 2022-08-03 MED ORDER — ENOXAPARIN SODIUM 40 MG/0.4ML IJ SOSY
40.0000 mg | PREFILLED_SYRINGE | INTRAMUSCULAR | Status: DC
  Administered 2022-08-03 – 2022-08-04 (×2): 40 mg via SUBCUTANEOUS
  Filled 2022-08-03 (×2): qty 0.4

## 2022-08-03 MED ORDER — LABETALOL HCL 5 MG/ML IV SOLN
5.0000 mg | Freq: Once | INTRAVENOUS | Status: AC
  Administered 2022-08-03: 5 mg via INTRAVENOUS
  Filled 2022-08-03: qty 4

## 2022-08-03 MED ORDER — VANCOMYCIN HCL 1250 MG/250ML IV SOLN
1250.0000 mg | INTRAVENOUS | Status: DC
Start: 2022-08-03 — End: 2022-08-04
  Administered 2022-08-03: 1250 mg via INTRAVENOUS
  Filled 2022-08-03: qty 250

## 2022-08-03 MED ORDER — INSULIN ASPART 100 UNIT/ML IJ SOLN
0.0000 [IU] | Freq: Three times a day (TID) | INTRAMUSCULAR | Status: DC
  Administered 2022-08-03: 7 [IU] via SUBCUTANEOUS

## 2022-08-03 MED ORDER — LISINOPRIL 10 MG PO TABS
20.0000 mg | ORAL_TABLET | Freq: Every day | ORAL | Status: DC
  Administered 2022-08-03 – 2022-08-04 (×2): 20 mg via ORAL
  Filled 2022-08-03 (×2): qty 2

## 2022-08-03 MED ORDER — AMLODIPINE BESYLATE 5 MG PO TABS
5.0000 mg | ORAL_TABLET | Freq: Every day | ORAL | Status: DC
  Administered 2022-08-03 – 2022-08-04 (×2): 5 mg via ORAL
  Filled 2022-08-03 (×2): qty 1

## 2022-08-03 NOTE — Progress Notes (Signed)
Shawna Huerta is a 61 y.o. female with medical history significant of hypertension, T2DM, hyperlipidemia who presents to the emergency department due to abdominal pain nausea, vomiting, diarrhea which started on Sunday (9/3), abdominal pain was intermittent and was of moderate intensity.  Patient was admitted with acute gastroenteritis as well as possible esophagitis as noted on CT scan.  She was empirically started on IV cefepime and vancomycin and is currently on clear liquid diet.  She is also noted to have some AKI as well as electrolyte abnormalities which are improving with correction.  Patient seen and evaluated at bedside and has been admitted after midnight.  Plan to continue clear liquids today as well as IV fluid and antibiotics as ordered and monitor cultures.  Repeat a.m. labs and advance diet as tolerated in the next 24 hours.  Anticipate discharge in the next 1-2 days pending clinical improvement.  Total care time: 25 minutes.

## 2022-08-03 NOTE — H&P (Addendum)
History and Physical    Patient: Shawna Huerta QDI:[id number] DOB: 1960-12-14 DOA: 08/02/2022 DOS: the patient was seen and examined on 08/03/2022 PCP: Renee Rival, FNP  Patient coming from: Home  Chief Complaint:  Chief Complaint  Patient presents with   Emesis   HPI: Shawna Huerta is a 61 y.o. female with medical history significant of hypertension, T2DM, hyperlipidemia who presents to the emergency department due to abdominal pain nausea, vomiting, diarrhea which started on Sunday (9/3), abdominal pain was intermittent and was of moderate intensity.  Pain worsens with movement.  Diarrhea was watery in nature and was 5-6 episodes daily, no blood in diarrhea.  Vomiting was nonbloody and was about 4 episodes daily.  She has had difficulty in being able to keep any meals down, though she endorses being able to keep down the potato soup she had at home and the 6 spoonfuls of oatmeal she had in the morning.  She denies chest pain, shortness of breath, headache, any sick contact. She was recently admitted from 7/28 to 7/24 due to similar presentation.  ED Course:  In the emergency department, patient was tachycardic, BP was 12/28/1980 on arrival and other vital signs were within normal range.  Work-up in the ED showed leukocytosis and microcytic anemia.  BMP showed hyponatremia, hypokalemia, hyperglycemia, BUN/creatinine 24/1.50 (baseline creatinine at 0.7-1.1).  Lactic acid 4.5 > 2.9.  Lipase 48, SARS coronavirus 2 was negative.  Blood culture pending, influenza A, B was negative CT angiography of abdomen and pelvis without and with contrast showed: VASCULAR   1. The SMA, IMA and SMV are normal. There are no findings of acute mesenteric ischemia. No pneumatosis or portal venous gas. 2. Trace nonstenosing aortic calcific plaques. 3. 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 4. No further visceral arterial stenoses. No aortic or  branch artery flow-limiting stenosis, dissection or aneurysm.   NON-VASCULAR   1. imaging findings of gastroenteritis. No bowel obstruction or inflammatory change. 2. Increasing moderate circumferential thickening of the distal thoracic esophagus, could be from severe esophagitis versus infiltrating disease. Endoscopic follow-up recommended. 3. Mild hepatic steatosis. 4. Nonobstructive micronephrolithiasis.  She was treated with IV cefepime and vancomycin, Solu-Medrol was given, morphine was given due to pain, Zofran was given due to nausea and vomiting.  Labetalol was given due to elevated BP, IV hydration was provided, Benadryl was given and hospitalist was asked to admit patient for further evaluation and management.  Review of Systems: Review of systems as noted in the HPI. All other systems reviewed and are negative.   Past Medical History:  Diagnosis Date   Anemia    Arthritis    Bartholin cyst 07/03/2014   Colitis 08/2015   Treated at [facility]   Diabetes mellitus    Hypertension    Labial abscess 08/2015   Status post I&D by Dr. Ladona Horns   Nicotine addiction 09/03/2013   Obesity    Pancreatitis 09/2015   unknown etiology, no further work-up with gastroenterology    Vaginal itching 09/03/2013   Past Surgical History:  Procedure Laterality Date   ABDOMINAL HYSTERECTOMY     ABDOMINAL SURGERY     exploratry with TAH   BALLOON DILATION N/A 10/26/2016   Procedure: Pyloric channel BALLOON DILATION;  Surgeon: Daneil Dolin, MD;  Location: [facility];  Service: Endoscopy;  Laterality: N/A;   BARTHOLIN GLAND CYST EXCISION Right 08/20/2014   Procedure: EXCISION OF RIGHT  BARTHOLIN'S GLAND CYST;  Surgeon: Florian Buff, MD;  Location: [facility] OR[facility];  Service: Gynecology;  Laterality: Right;   CHOLECYSTECTOMY     COLONOSCOPY  10/2015   Doristine Mango: mild diverticulosis, at least 8 polpys removed, multiple tubular adenomas. next tcs in 1 year   ESOPHAGOGASTRODUODENOSCOPY N/A  10/26/2016   Procedure: ESOPHAGOGASTRODUODENOSCOPY (EGD);  Surgeon: Daneil Dolin, MD;  Location: [facility];  Service: Endoscopy;  Laterality: N/A;   FOOT SURGERY     FOOT SURGERY Right 2011   shave bone   TONSILLECTOMY      Social History:  reports that she quit smoking about 5 years ago. Her smoking use included cigarettes. She has a 0.75 pack-year smoking history. She has never used smokeless tobacco. She reports that she does not currently use drugs after having used the following drugs: Marijuana. She reports that she does not drink alcohol.   Allergies  Allergen Reactions   Iohexol Swelling     Code: HIVES, Desc: PT STATES THE EYES SWOLL AND ITCHED, NEEDS PRE MEDS     Family History  Problem Relation Age of Onset   Diabetes Mother    Stroke Mother    Cancer Father        liver and colon. Patient unsure primary    Stomach cancer Father    Diabetes Sister    Stomach cancer Sister    Liver cancer Sister    Bone cancer Sister    Diabetes Brother    Lung cancer Brother    Heart disease Son    Breast cancer Neg Hx      Prior to Admission medications   Medication Sig Start Date End Date Taking? Authorizing Provider  acetaminophen (TYLENOL) 325 MG tablet Take 650 mg by mouth every 6 (six) hours as needed for moderate pain.   Yes [provider]  amLODipine (NORVASC) 5 MG tablet Take 1 tablet (5 mg total) by mouth daily. 06/20/22 08/02/22 Yes Shahmehdi, Seyed A, MD  atorvastatin (LIPITOR) 10 MG tablet Take 1 tablet (10 mg total) by mouth daily. 02/18/22  Yes Paseda, Dewaine Conger, FNP  diclofenac Sodium (VOLTAREN) 1 % GEL Apply 2 g topically daily as needed (pain).   Yes [provider]  glyBURIDE-metformin (GLUCOVANCE) 5-500 MG tablet TAKE 1 TABLET BY MOUTH TWICE DAILY Patient taking differently: Take 1 tablet by mouth 2 (two) times daily with a meal. 06/16/22  Yes Paseda, Folashade R, FNP  lisinopril (ZESTRIL) 20 MG tablet Take 1 tablet (20 mg total) by mouth  daily. 02/17/22  Yes Paseda, Dewaine Conger, FNP  Lancets (ONETOUCH ULTRASOFT) lancets Use as instructed 04/13/22   Paseda, Dewaine Conger, FNP  ondansetron (ZOFRAN) 4 MG tablet Take 1 tablet (4 mg total) by mouth every 6 (six) hours as needed for nausea. Patient not taking: Reported on 08/02/2022 06/20/22   Deatra James, MD  [facility] VERIO test strip 2 (two) times daily. 04/12/22   [provider]  UNABLE TO FIND Blood glucose monitor kit check blood glucose twice daily dx: e11.65 04/12/22   Renee Rival, FNP  UNABLE TO FIND Glucose test strips check blood glucose twice daily dx e11.65 04/12/22   Renee Rival, FNP    Physical Exam: BP (!) 164/95 (BP Location: Right Arm)   Pulse 76   Temp 98.5 F (36.9 C) (Oral)   Resp 18   Ht _0  (1.702 m)   Wt 95.3 kg   SpO2 100%   BMI 32.89 kg/m  General: 61 y.o. year-old female ill appearing in no acute distress.  Alert and oriented x3. HEENT: NCAT, EOMI, dry mucous membrane Neck: Supple, trachea medial Cardiovascular: Tachycardia.  Regular rate and rhythm with no rubs or gallops.  No thyromegaly or JVD noted.  No lower extremity edema. 2/4 pulses in all 4 extremities. Respiratory: Clear to auscultation with no wheezes or rales. Good inspiratory effort. Abdomen: Soft, diffuse tenderness to palpation without guarding.  Nondistended with normal bowel sounds x4 quadrants. Muskuloskeletal: No cyanosis, clubbing or edema noted bilaterally Neuro: CN II-XII intact, strength 5/5 x 4, sensation, reflexes intact Skin: No ulcerative lesions noted or rashes Psychiatry: Judgement and insight appear normal. Mood is appropriate for condition and setting          Labs on Admission:  Basic Metabolic Panel: Recent Labs  Lab 08/02/22 1736  NA 129*  K 3.2*  CL 87*  CO2 25  GLUCOSE 456*  BUN 24*  CREATININE 1.50*  CALCIUM 9.7   Liver Function Tests: Recent Labs  Lab 08/02/22 1736  AST 17  ALT 15  ALKPHOS 85  BILITOT 0.9  PROT  9.9*  ALBUMIN 4.4   Recent Labs  Lab 08/02/22 1736  LIPASE 48   No results for input(s): "AMMONIA" in the last 168 hours. CBC: Recent Labs  Lab 08/02/22 1736  WBC 21.4*  NEUTROABS 18.5*  HGB 14.4  HCT 45.3  MCV 69.1*  PLT 371   Cardiac Enzymes: No results for input(s): "CKTOTAL", "CKMB", "CKMBINDEX", "TROPONINI" in the last 168 hours.  BNP (last 3 results) No results for input(s): "BNP" in the last 8760 hours.  ProBNP (last 3 results) No results for input(s): "PROBNP" in the last 8760 hours.  CBG: Recent Labs  Lab 08/02/22 1735 08/03/22 0223  GLUCAP 445* 391*    Radiological Exams on Admission: CT Angio Abd/Pel W and/or Wo Contrast  Result Date: 08/03/2022 CLINICAL DATA:  Suspected acute mesenteric ischemia. Complains of body aches, nausea and vomiting. EXAM: CTA ABDOMEN AND PELVIS WITHOUT AND WITH CONTRAST TECHNIQUE: Multidetector CT imaging of the abdomen and pelvis was performed using the standard protocol during bolus administration of intravenous contrast. Multiplanar reconstructed images and MIPs were obtained and reviewed to evaluate the vascular anatomy. RADIATION DOSE REDUCTION: This exam was performed according to the departmental dose-optimization program which includes automated exposure control, adjustment of the mA and/or kV according to patient size and/or use of iterative reconstruction technique. CONTRAST:  56m OMNIPAQUE IOHEXOL 350 MG/ML SOLN COMPARISON:  CT studies without contrast recently 06/16/2022, 05/01/2022 and 07/05/2019 FINDINGS: VASCULAR Aorta: There are trace aortic calcific plaques. There is no stenosis, dissection or aneurysm. 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 The vessel is otherwise widely patent. SMA: Normal. Renals: Both renal arteries are patent without evidence of aneurysm, dissection, vasculitis, fibromuscular dysplasia or significant stenosis.  IMA: Normal. Inflow: Patent without evidence of aneurysm, dissection, vasculitis or significant stenosis. Proximal Outflow: Bilateral common femoral and visualized portions of the superficial and profunda femoral arteries are patent without evidence of aneurysm, dissection, vasculitis or significant stenosis. Veins: Patent, including a normal caliber portal vein and branches. Review of the MIP images confirms the above findings. NON-VASCULAR Lower chest: There is increasing moderate circumferential thickening in the distal thoracic esophagus, was relatively mild on 06/16/2022. Not seen on 05/01/2022. Consider endoscopic follow-up. Lung bases are clear. The cardiac size is normal. Hepatobiliary: 17.6 cm in length  mildly steatotic liver is noted without mass. Old cholecystectomy without biliary dilatation. Pancreas: No focal abnormality. Spleen: No focal abnormality or splenomegaly. Adrenals/Urinary Tract: No adrenal mass, no renal cortical mass enhancement. There are scattered punctate nonobstructive caliceal stones in the left kidney, were better visualized on prior noncontrast studies. No intrarenal stone is seen on the right. No ureteral stone or hydronephrosis either side. There is no bladder thickening. Stomach/Bowel: Fluid distended stomach with mild mucosal thickening and enhancement compatible with gastritis with thickened folds in the duodenal and jejunum. There is scattered fluid-filled normal caliber mid to lower abdominal small bowel. No mesenteric inflammatory changes or small-bowel obstruction are seen. There is no bowel pneumatosis. The appendix is normal. There are no findings of colitis or diverticulitis. There is mild chronic thickening of the rectum. Lymphatic: No adenopathy is seen. Reproductive: Status post hysterectomy. No adnexal masses. Other: There is no incarcerated hernia. There are small inguinal fat hernias. Musculoskeletal: There are degenerative changes of the thoracic and lumbar spine,  with ankylosis across both SI joints and changes of chronic pelvic enthesopathy. No concerning bone lesion. IMPRESSION: VASCULAR 1. The SMA, IMA and SMV are normal. There are no findings of acute mesenteric ischemia. No pneumatosis or portal venous gas. 2. Trace nonstenosing aortic calcific plaques. 3. 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 4. No further visceral arterial stenoses. No aortic or branch artery flow-limiting stenosis, dissection or aneurysm. NON-VASCULAR 1. imaging findings of gastroenteritis. No bowel obstruction or inflammatory change. 2. Increasing moderate circumferential thickening of the distal thoracic esophagus, could be from severe esophagitis versus infiltrating disease. Endoscopic follow-up recommended. 3. Mild hepatic steatosis. 4. Nonobstructive micronephrolithiasis. Electronically Signed   By: Telford Nab M.D.   On: 08/03/2022 00:38   DG Chest Port 1 View  Result Date: 08/02/2022 CLINICAL DATA:  Cough EXAM: PORTABLE CHEST 1 VIEW COMPARISON:  06/16/2022 FINDINGS: The heart size and mediastinal contours are within normal limits. Both lungs are clear. The visualized skeletal structures are unremarkable. IMPRESSION: No active disease. Electronically Signed   By: Elmer Picker M.D.   On: 08/02/2022 17:59    EKG: I independently viewed the EKG done and my findings are as followed: Sinus tachycardia at rate of 104 bpm with QTc 495 ms  Assessment/Plan Present on Admission:  Acute gastroenteritis  Abdominal pain  Obesity  Diarrhea  AKI (acute kidney injury) ([place])  Microcytic anemia  Hyperlipidemia LDL goal <70  Hyponatremia  Essential hypertension  Uncontrolled type 2 diabetes mellitus with hyperglycemia, without long-term current use of insulin ([place])  Active Problems:   SIRS (systemic inflammatory response syndrome) ([facility])   AKI (acute kidney injury) ([place])   Uncontrolled type 2 diabetes mellitus  with hyperglycemia, without long-term current use of insulin ([facility])   Obesity   Diarrhea   Nausea & vomiting   Hyponatremia   Essential hypertension   Abdominal pain   Microcytic anemia   Hyperlipidemia LDL goal <70   Acute gastroenteritis   Hypokalemia   Hypertensive urgency   SIRS with suspicion for sepsis Patient was tachycardic and WBC was elevated at 21.4 (meeting SIRS criteria).  This may be due to an infectious process, however, inflammatory process was also suspected.  Lactic acid was 4.5 > 2.9 Patient was empirically treated with IV antibiotics, we shall continue with same at this time  Abdominal pain, nausea, vomiting and diarrhea secondary to acute gastroenteritis Continue IV NS at 100 mLs/Hr Patient was empirically  started on IV cefepime and vancomycin, we shall continue with this at this time with plan to discontinue/de-escalate based on blood culture, procalcitonin, C. difficile and GI stool panel Continue IV morphine 2 mg q.4h p.r.n. for moderate to severe pain Continue IV Compazine p.r.n. Continue clear liquid diet with plan to advanced diet as tolerated  Hypertensive urgency (resolved) Essential hypertension (uncontrolled) Continue amlodipine, lisinopril  Microcytic anemia MCV 69.1; iron studies will be checked  Hyponatremia possibly due to dehydration Na 129, continue IV hydration Continue to monitor sodium levels with morning labs  Hypokalemia K+ 3.2, this will be replenished  Lactic acidosis possibly secondary to multifactorial Lactic acid 4.5 > 2.9 Continue IV hydration and continue to trend lactic acid  Prolonged QT interval QTc 495 ms Avoid QT prolonging drugs Magnesium level will be checked Continue telemetry  T2DM with uncontrolled hyperglycemia Continue ISS and hypoglycemia protocol  Acute kidney injury BUN/creatinine 24/1.50 (baseline creatinine at 0.7-1.1) Continue IV hydration Renally adjust medications, avoid nephrotoxic  agents/dehydration/hypotension  Mixed hyperlipidemia Continue Lipitor  Obesity (BMI 32.89) Continue diet and lifestyle modification  DVT prophylaxis: Lovenox   Code Status: DNR  Consults: None  Family Communication: None at bedside  Severity of Illness: The appropriate patient status for this patient is INPATIENT. Inpatient status is judged to be reasonable and necessary in order to provide the required intensity of service to ensure the patient's safety. The patient's presenting symptoms, physical exam findings, and initial radiographic and laboratory data in the context of their chronic comorbidities is felt to place them at high risk for further clinical deterioration. Furthermore, it is not anticipated that the patient will be medically stable for discharge from the hospital within 2 midnights of admission.   * I certify that at the point of admission it is my clinical judgment that the patient will require inpatient hospital care spanning beyond 2 midnights from the point of admission due to high intensity of service, high risk for further deterioration and high frequency of surveillance required.*  Author: Bernadette Hoit, DO 08/03/2022 4:30 AM  For on call review [email or web address].

## 2022-08-03 NOTE — Progress Notes (Signed)
Pharmacy Antibiotic Note  Shawna Huerta is a 61 y.o. female admitted on 08/02/2022 with sepsis.  Pharmacy has been consulted for vancomycin and cefepime dosing.  Plan: Vancomycin '1500mg'$  x1 in ED then'1250mg'$  IV Q24H. Goal AUC 400-550.  Expected AUC 465. Cefepime 2g IV Q8H.  Height: '5\' 7"'$  (170.2 cm) Weight: 95.3 kg (210 lb) IBW/kg (Calculated) : 61.6  Temp (24hrs), Avg:98.4 F (36.9 C), Min:97.8 F (36.6 C), Max:99 F (37.2 C)  Recent Labs  Lab 08/02/22 1736 08/02/22 1948 08/03/22 0452  WBC 21.4*  --  19.4*  CREATININE 1.50*  --  0.94  LATICACIDVEN 4.5* 2.9* 1.8    Estimated Creatinine Clearance: 76.4 mL/min (by C-G formula based on SCr of 0.94 mg/dL).    Allergies  Allergen Reactions   Iohexol Swelling     Code: HIVES, Desc: PT STATES THE EYES SWOLL AND ITCHED, NEEDS PRE MEDS     Thank you for allowing pharmacy to be a part of this patient's care.  Wynona Neat, PharmD, BCPS  08/03/2022 6:46 AM

## 2022-08-03 NOTE — Inpatient Diabetes Management (Signed)
Inpatient Diabetes Program Recommendations  AACE/ADA: New Consensus Statement on Inpatient Glycemic Control (2015)  Target Ranges:  Prepandial:   less than 140 mg/dL      Peak postprandial:   less than 180 mg/dL (1-2 hours)      Critically ill patients:  140 - 180 mg/dL   Lab Results  Component Value Date   GLUCAP 304 (H) 08/03/2022   HGBA1C 8.3 (H) 06/07/2022    Review of Glycemic Control  Latest Reference Range & Units 08/02/22 17:35 08/03/22 02:23 08/03/22 07:31 08/03/22 11:14  Glucose-Capillary 70 - 99 mg/dL 445 (H) 391 (H) 333 (H) 304 (H)   Diabetes history: DM 2 Outpatient Diabetes medications:Glyburide metformin 5-500 mg bid Current orders for Inpatient glycemic control:  Novolog 0-9 units tid + hs  A1c 8.3% on 7/11 Pt received Solumedrol 40 mg yesterday, Glucose ternds in the 300 range.  Inpatient Diabetes Program Recommendations:    -  consider Semglee 10 units x1 dose -  Increase Novolog Correction to "moderate" 0-15 units tid  Thanks,  Tama Headings RN, MSN, BC-ADM Inpatient Diabetes Coordinator Team Pager [phone] (8a-5p)

## 2022-08-04 DIAGNOSIS — N179 Acute kidney failure, unspecified: Secondary | ICD-10-CM | POA: Diagnosis not present

## 2022-08-04 DIAGNOSIS — K529 Noninfective gastroenteritis and colitis, unspecified: Secondary | ICD-10-CM | POA: Diagnosis not present

## 2022-08-04 LAB — COMPREHENSIVE METABOLIC PANEL
ALT: 12 U/L (ref 0–44)
AST: 9 U/L — ABNORMAL LOW (ref 15–41)
Albumin: 3.3 g/dL — ABNORMAL LOW (ref 3.5–5.0)
Alkaline Phosphatase: 55 U/L (ref 38–126)
Anion gap: 8 (ref 5–15)
BUN: 13 mg/dL (ref 6–20)
CO2: 25 mmol/L (ref 22–32)
Calcium: 8.7 mg/dL — ABNORMAL LOW (ref 8.9–10.3)
Chloride: 101 mmol/L (ref 98–111)
Creatinine, Ser: 0.72 mg/dL (ref 0.44–1.00)
GFR, Estimated: >60 mL/min (ref >60)
Glucose, Bld: 234 mg/dL — ABNORMAL HIGH (ref 70–99)
Potassium: 3.3 mmol/L — ABNORMAL LOW (ref 3.5–5.1)
Sodium: 134 mmol/L — ABNORMAL LOW (ref 135–145)
Total Bilirubin: 0.9 mg/dL (ref 0.3–1.2)
Total Protein: 7.2 g/dL (ref 6.5–8.1)

## 2022-08-04 LAB — CBC
HCT: 37.3 % (ref 36.0–46.0)
Hemoglobin: 12.1 g/dL (ref 12.0–15.0)
MCH: 22.2 pg — ABNORMAL LOW (ref 26.0–34.0)
MCHC: 32.4 g/dL (ref 30.0–36.0)
MCV: 68.3 fL — ABNORMAL LOW (ref 80.0–100.0)
Platelets: 272 10*3/uL (ref 150–400)
RBC: 5.46 MIL/uL — ABNORMAL HIGH (ref 3.87–5.11)
RDW: 16.7 % — ABNORMAL HIGH (ref 11.5–15.5)
WBC: 16 10*3/uL — ABNORMAL HIGH (ref 4.0–10.5)
nRBC: 0 % (ref 0.0–0.2)

## 2022-08-04 LAB — GLUCOSE, CAPILLARY
Glucose-Capillary: 218 mg/dL — ABNORMAL HIGH (ref 70–99)
Glucose-Capillary: 224 mg/dL — ABNORMAL HIGH (ref 70–99)

## 2022-08-04 LAB — MAGNESIUM: Magnesium: 1.7 mg/dL (ref 1.7–2.4)

## 2022-08-04 MED ORDER — INSULIN GLARGINE-YFGN 100 UNIT/ML ~~LOC~~ SOLN
10.0000 [IU] | Freq: Once | SUBCUTANEOUS | Status: AC
  Administered 2022-08-04: 10 [IU] via SUBCUTANEOUS
  Filled 2022-08-04: qty 0.1

## 2022-08-04 MED ORDER — POTASSIUM CHLORIDE CRYS ER 20 MEQ PO TBCR
40.0000 meq | EXTENDED_RELEASE_TABLET | Freq: Once | ORAL | Status: AC
  Administered 2022-08-04: 40 meq via ORAL
  Filled 2022-08-04: qty 2

## 2022-08-04 MED ORDER — ONDANSETRON HCL 4 MG PO TABS: 4.0000 mg | ORAL_TABLET | Freq: Three times a day (TID) | ORAL | 0 refills | Status: DC | PRN

## 2022-08-04 NOTE — Care Management Important Message (Signed)
Important Message  Patient Details  Name: Shawna Huerta MRN: [id number] Date of Birth: 01-15-1961   Medicare Important Message Given:  N/A - LOS <3 / Initial given by admissions     Tommy Medal 08/04/2022, 3:02 PM

## 2022-08-04 NOTE — TOC Transition Note (Signed)
Transition of Care [facility]) - CM/SW Discharge Note   Patient Details  Name: Shawna Huerta MRN: [id number] Date of Birth: 1961/11/24  Transition of Care [facility]) CM/SW Contact:  Shade Flood, LCSW Phone Number: 08/04/2022, 2:21 PM   Clinical Narrative:     Pt admitted from home. Pt high risk for readmission. Met with pt at bedside to assess. Pt reports that she live alone and plans to return home at dc. Pt states that she is independent in ADLs at baseline and does not use any DME. Per pt, she is able to get to appointments and obtain medications without difficulty. Pt states she does have her sisters as additional support if needed.  Pt will dc home today. She denies any TOC needs for dc.  Expected Discharge Plan: Home/Self Care Barriers to Discharge: Barriers Resolved   Patient Goals and CMS Choice Patient states their goals for this hospitalization and ongoing recovery are:: go home      Expected Discharge Plan and Services Expected Discharge Plan: Home/Self Care In-house Referral: Clinical Social Work     Living arrangements for the past 2 months: Single Family Home Expected Discharge Date: 08/04/22                                    Prior Living Arrangements/Services Living arrangements for the past 2 months: Single Family Home Lives with:: Self Patient language and need for interpreter reviewed:: Yes Do you feel safe going back to the place where you live?: Yes      Need for Family Participation in Patient Care: No (Comment)     Criminal Activity/Legal Involvement Pertinent to Current Situation/Hospitalization: No - Comment as needed  Activities of Daily Living Home Assistive Devices/Equipment: None ADL Screening (condition at time of admission) Patient's cognitive ability adequate to safely complete daily activities?: Yes Is the patient deaf or have difficulty hearing?: No Does the patient have difficulty seeing, even when wearing glasses/contacts?: No Does  the patient have difficulty concentrating, remembering, or making decisions?: No Patient able to express need for assistance with ADLs?: Yes Does the patient have difficulty dressing or bathing?: No Independently performs ADLs?: Yes (appropriate for developmental age) Does the patient have difficulty walking or climbing stairs?: No Weakness of Legs: None Weakness of Arms/Hands: None  Permission Sought/Granted                  Emotional Assessment Appearance:: Appears younger than stated age Attitude/Demeanor/Rapport: Engaged Affect (typically observed): Pleasant Orientation: : Oriented to Self, Oriented to Place, Oriented to  Time, Oriented to Situation Alcohol / Substance Use: Not Applicable Psych Involvement: No (comment)  Admission diagnosis:  Acute gastroenteritis [K52.9] Patient Active Problem List   Diagnosis Date Noted   Acute gastroenteritis 08/03/2022   Hypokalemia 08/03/2022   Hypertensive urgency 08/03/2022   Dehydration 06/17/2022   SIRS (systemic inflammatory response syndrome) ([place]) 06/17/2022   Abnormal serum level of lipase 06/17/2022   Sepsis ([place]) 06/17/2022    Class: Acute   Hyperlipidemia LDL goal <70 04/09/2022   Microcytic anemia 02/17/2022   Need for immunization against influenza 02/17/2022   Left breast mass 02/17/2022   Marijuana smoker 02/17/2022   Left leg DVT ([place]) 11/12/2021   Abdominal pain 07/05/2019   Enteritis 07/05/2019   Colon adenomas 12/28/2016   Normocytic anemia 12/28/2016   Postviral gastroparesis    Type 2 diabetes mellitus ([place]) 10/23/2016   AKI (acute kidney  injury) ([place]) 10/23/2016   IBS (irritable bowel syndrome) 10/12/2015   Acute pancreatitis 10/10/2015   Diarrhea 10/10/2015   Nausea & vomiting 10/10/2015   Type 2 diabetes mellitus with other specified complication ([place]) 72/15/8727   Labial abscess 10/10/2015   Hyponatremia 10/10/2015   Essential hypertension 10/10/2015   Tobacco abuse 10/10/2015   Bartholin cyst  07/03/2014   Uncontrolled type 2 diabetes mellitus with hyperglycemia, without long-term current use of insulin ([place]) 09/03/2013   Obesity 09/03/2013   Vaginal itching 09/03/2013   Nicotine addiction 09/03/2013   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   TENOSYNOVITIS OF FOOT AND ANKLE 09/22/2010   PCP:  Renee Rival, FNP Pharmacy:   [facility], [address] [place] [address] Phone: [phone] Fax: [phone]     Social Determinants of Health (SDOH) Interventions    Readmission Risk Interventions    08/04/2022    2:20 PM  Readmission Risk Prevention Plan  Transportation Screening Complete  HRI or [place] Complete  Social Work Consult for [place] Planning/Counseling Complete  Palliative Care Screening Not Applicable  Medication Review Press photographer) Complete     Final next level of care: Home/Self Care Barriers to Discharge: Barriers Resolved   Patient Goals and CMS Choice Patient states their goals for this hospitalization and ongoing recovery are:: go home      Discharge Placement                       Discharge Plan and Services In-house Referral: Clinical Social Work                                   Social Determinants of Health (SDOH) Interventions     Readmission Risk Interventions    08/04/2022    2:20 PM  Readmission Risk Prevention Plan  Transportation Screening Complete  HRI or [place] Complete  Social Work Consult for [place] Planning/Counseling Complete  Palliative Care Screening Not Applicable  Medication Review Press photographer) Complete

## 2022-08-04 NOTE — Discharge Summary (Signed)
Physician Discharge Summary  Shawna Huerta RKY:[id number] DOB: Mar 22, 1961 DOA: 08/02/2022  PCP: Renee Rival, FNP  Admit date: 08/02/2022  Discharge date: 08/04/2022  Admitted From:Home  Disposition:  Home  Recommendations for Outpatient Follow-up:  Follow up with PCP in 1-2 weeks Zofran ordered as needed for nausea or vomiting Continue other home medications as prior  Home Health: None  Equipment/Devices: None  Discharge Condition:Stable  CODE STATUS: DNR  Diet recommendation: Heart Healthy/carb modified  Brief/Interim Summary: Shawna Huerta is a 61 y.o. female with medical history significant of hypertension, T2DM, hyperlipidemia who presents to the emergency department due to abdominal pain nausea, vomiting, diarrhea which started on Sunday (9/3), abdominal pain was intermittent and was of moderate intensity.  Patient was admitted with acute gastroenteritis as well as possible esophagitis as noted on CT scan.  She was empirically started on IV cefepime and vancomycin as well as a clear liquid diet which was advanced to soft which she was able to tolerate on the day of discharge.  She no longer appear to require any cefepime or vancomycin as there is no infectious etiology without appear to be treated from these medications.  She was noted to have some AKI as well as significant hyperglycemia which have now improved and she feels as though she is back to baseline and tolerating diet and stable for discharge.  No other acute concerns or events noted throughout the course of the stay and blood cultures with no growth and urine cultures with insignificant growth noted.  Discharge Diagnoses:  Active Problems:   SIRS (systemic inflammatory response syndrome) (HCC)   AKI (acute kidney injury) ([place])   Uncontrolled type 2 diabetes mellitus with hyperglycemia, without long-term current use of insulin (HCC)   Obesity   Diarrhea   Nausea & vomiting   Hyponatremia   Essential hypertension    Abdominal pain   Microcytic anemia   Hyperlipidemia LDL goal <70   Acute gastroenteritis   Hypokalemia   Hypertensive urgency  Principal discharge diagnosis: Acute gastroenteritis with hypertensive urgency and electrolyte abnormalities.  Sepsis ruled out.  Discharge Instructions  Discharge Instructions     Diet - low sodium heart healthy   Complete by: As directed    Increase activity slowly   Complete by: As directed       Allergies as of 08/04/2022       Reactions   Iohexol Swelling    Code: HIVES, Desc: PT STATES THE EYES SWOLL AND ITCHED, NEEDS PRE MEDS        Medication List     TAKE these medications    acetaminophen 325 MG tablet Commonly known as: TYLENOL Take 650 mg by mouth every 6 (six) hours as needed for moderate pain.   amLODipine 5 MG tablet Commonly known as: NORVASC Take 1 tablet (5 mg total) by mouth daily.   atorvastatin 10 MG tablet Commonly known as: LIPITOR Take 1 tablet (10 mg total) by mouth daily.   diclofenac Sodium 1 % Gel Commonly known as: VOLTAREN Apply 2 g topically daily as needed (pain).   glyBURIDE-metformin 5-500 MG tablet Commonly known as: GLUCOVANCE TAKE 1 TABLET BY MOUTH TWICE DAILY What changed:  how much to take how to take this when to take this additional instructions   lisinopril 20 MG tablet Commonly known as: ZESTRIL Take 1 tablet (20 mg total) by mouth daily.   ondansetron 4 MG tablet Commonly known as: Zofran Take 1 tablet (4 mg total) by mouth every  8 (eight) hours as needed for nausea or vomiting.   onetouch ultrasoft lancets Use as instructed   OneTouch Verio test strip Generic drug: glucose blood 2 (two) times daily.   UNABLE TO FIND Blood glucose monitor kit check blood glucose twice daily dx: e11.65   UNABLE TO FIND Glucose test strips check blood glucose twice daily dx e11.65        Follow-up Information     Renee Rival, FNP. Schedule an appointment as soon as possible  for a visit in 1 week(s).   Specialty: Nurse Practitioner Contact information: [address] [place] [address] [phone]                Allergies  Allergen Reactions   Iohexol Swelling     Code: HIVES, Desc: PT STATES THE EYES SWOLL AND ITCHED, NEEDS PRE MEDS     Consultations: None   Procedures/Studies: CT Angio Abd/Pel W and/or Wo Contrast  Result Date: 08/03/2022 CLINICAL DATA:  Suspected acute mesenteric ischemia. Complains of body aches, nausea and vomiting. EXAM: CTA ABDOMEN AND PELVIS WITHOUT AND WITH CONTRAST TECHNIQUE: Multidetector CT imaging of the abdomen and pelvis was performed using the standard protocol during bolus administration of intravenous contrast. Multiplanar reconstructed images and MIPs were obtained and reviewed to evaluate the vascular anatomy. RADIATION DOSE REDUCTION: This exam was performed according to the departmental dose-optimization program which includes automated exposure control, adjustment of the mA and/or kV according to patient size and/or use of iterative reconstruction technique. CONTRAST:  36m OMNIPAQUE IOHEXOL 350 MG/ML SOLN COMPARISON:  CT studies without contrast recently 06/16/2022, 05/01/2022 and 07/05/2019 FINDINGS: VASCULAR Aorta: There are trace aortic calcific plaques. There is no stenosis, dissection or aneurysm. 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 The vessel is otherwise widely patent. SMA: Normal. Renals: Both renal arteries are patent without evidence of aneurysm, dissection, vasculitis, fibromuscular dysplasia or significant stenosis. IMA: Normal. Inflow: Patent without evidence of aneurysm, dissection, vasculitis or significant stenosis. Proximal Outflow: Bilateral common femoral and visualized portions of the superficial and profunda femoral arteries are patent without evidence of aneurysm, dissection,  vasculitis or significant stenosis. Veins: Patent, including a normal caliber portal vein and branches. Review of the MIP images confirms the above findings. NON-VASCULAR Lower chest: There is increasing moderate circumferential thickening in the distal thoracic esophagus, was relatively mild on 06/16/2022. Not seen on 05/01/2022. Consider endoscopic follow-up. Lung bases are clear. The cardiac size is normal. Hepatobiliary: 17.6 cm in length mildly steatotic liver is noted without mass. Old cholecystectomy without biliary dilatation. Pancreas: No focal abnormality. Spleen: No focal abnormality or splenomegaly. Adrenals/Urinary Tract: No adrenal mass, no renal cortical mass enhancement. There are scattered punctate nonobstructive caliceal stones in the left kidney, were better visualized on prior noncontrast studies. No intrarenal stone is seen on the right. No ureteral stone or hydronephrosis either side. There is no bladder thickening. Stomach/Bowel: Fluid distended stomach with mild mucosal thickening and enhancement compatible with gastritis with thickened folds in the duodenal and jejunum. There is scattered fluid-filled normal caliber mid to lower abdominal small bowel. No mesenteric inflammatory changes or small-bowel obstruction are seen. There is no bowel pneumatosis. The appendix is normal. There are no findings of colitis or diverticulitis. There is mild chronic thickening of the rectum. Lymphatic: No adenopathy is seen. Reproductive: Status post hysterectomy. No adnexal masses. Other: There is no incarcerated hernia.  There are small inguinal fat hernias. Musculoskeletal: There are degenerative changes of the thoracic and lumbar spine, with ankylosis across both SI joints and changes of chronic pelvic enthesopathy. No concerning bone lesion. IMPRESSION: VASCULAR 1. The SMA, IMA and SMV are normal. There are no findings of acute mesenteric ischemia. No pneumatosis or portal venous gas. 2. Trace  nonstenosing aortic calcific plaques. 3. 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 4. No further visceral arterial stenoses. No aortic or branch artery flow-limiting stenosis, dissection or aneurysm. NON-VASCULAR 1. imaging findings of gastroenteritis. No bowel obstruction or inflammatory change. 2. Increasing moderate circumferential thickening of the distal thoracic esophagus, could be from severe esophagitis versus infiltrating disease. Endoscopic follow-up recommended. 3. Mild hepatic steatosis. 4. Nonobstructive micronephrolithiasis. Electronically Signed   By: Telford Nab M.D.   On: 08/03/2022 00:38   DG Chest Port 1 View  Result Date: 08/02/2022 CLINICAL DATA:  Cough EXAM: PORTABLE CHEST 1 VIEW COMPARISON:  06/16/2022 FINDINGS: The heart size and mediastinal contours are within normal limits. Both lungs are clear. The visualized skeletal structures are unremarkable. IMPRESSION: No active disease. Electronically Signed   By: Elmer Picker M.D.   On: 08/02/2022 17:59     Discharge Exam: Vitals:   08/04/22 0412 08/04/22 0834  BP: (!) 177/84 (!) 169/74  Pulse: (!) 102 84  Resp: 20   Temp: 98.4 F (36.9 C)   SpO2: 100%    Vitals:   08/03/22 1220 08/03/22 2211 08/04/22 0412 08/04/22 0834  BP: (!) 151/77 (!) 162/93 (!) 177/84 (!) 169/74  Pulse: 96 (!) 110 (!) 102 84  Resp: '20 20 20   ' Temp: 98.4 F (36.9 C) 98.3 F (36.8 C) 98.4 F (36.9 C)   TempSrc: Oral Oral Oral   SpO2: 100% 100% 100%   Weight:      Height:        General: Pt is alert, awake, not in acute distress Cardiovascular: RRR, S1/S2 +, no rubs, no gallops Respiratory: CTA bilaterally, no wheezing, no rhonchi Abdominal: Soft, NT, ND, bowel sounds + Extremities: no edema, no cyanosis    The results of significant diagnostics from this hospitalization (including imaging, microbiology, ancillary and laboratory) are listed below for reference.      Microbiology: Recent Results (from the past 240 hour(s))  Culture, blood (routine x 2)     Status: None (Preliminary result)   Collection Time: 08/02/22  5:38 PM   Specimen: Left Antecubital; Blood  Result Value Ref Range Status   Specimen Description LEFT ANTECUBITAL  Final   Special Requests   Final    BOTTLES DRAWN AEROBIC AND ANAEROBIC Blood Culture adequate volume   Culture   Final    NO GROWTH 2 DAYS Performed at [facility], [address], [place], [place] [address]    Report Status PENDING  Incomplete  Culture, blood (routine x 2)     Status: None (Preliminary result)   Collection Time: 08/02/22  5:43 PM   Specimen: BLOOD LEFT HAND  Result Value Ref Range Status   Specimen Description BLOOD LEFT HAND  Final   Special Requests   Final    BOTTLES DRAWN AEROBIC AND ANAEROBIC Blood Culture adequate volume   Culture   Final    NO GROWTH 2 DAYS Performed at [facility], [address], [place], [place] [address]    Report Status PENDING  Incomplete  Resp Panel by RT-PCR (Flu A&B, Covid) Anterior  Nasal Swab     Status: None   Collection Time: 08/02/22  5:48 PM   Specimen: Anterior Nasal Swab  Result Value Ref Range Status   SARS Coronavirus 2 by RT PCR NEGATIVE NEGATIVE Final    Comment: (NOTE) SARS-CoV-2 target nucleic acids are NOT DETECTED.  The SARS-CoV-2 RNA is generally detectable in upper respiratory specimens during the acute phase of infection. The lowest concentration of SARS-CoV-2 viral copies this assay can detect is 138 copies/mL. A negative result does not preclude SARS-Cov-2 infection and should not be used as the sole basis for treatment or other patient management decisions. A negative result may occur with  improper specimen collection/handling, submission of specimen other than nasopharyngeal swab, presence of viral mutation(s) within the areas targeted by this assay, and inadequate number of viral copies(<138 copies/mL). A negative result must be  combined with clinical observations, patient history, and epidemiological information. The expected result is Negative.  Fact Sheet for Patients:  EntrepreneurPulse.com.au  Fact Sheet for Healthcare Providers:  IncredibleEmployment.be  This test is no t yet approved or cleared by the [place] FDA and  has been authorized for detection and/or diagnosis of SARS-CoV-2 by FDA under an Emergency Use Authorization (EUA). This EUA will remain  in effect (meaning this test can be used) for the duration of the COVID-19 declaration under Section 564(b)(1) of the Act, 21 U.S.C.section 360bbb-3(b)(1), unless the authorization is terminated  or revoked sooner.       Influenza A by PCR NEGATIVE NEGATIVE Final   Influenza B by PCR NEGATIVE NEGATIVE Final    Comment: (NOTE) The Xpert Xpress SARS-CoV-2/FLU/RSV plus assay is intended as an aid in the diagnosis of influenza from Nasopharyngeal swab specimens and should not be used as a sole basis for treatment. Nasal washings and aspirates are unacceptable for Xpert Xpress SARS-CoV-2/FLU/RSV testing.  Fact Sheet for Patients: EntrepreneurPulse.com.au  Fact Sheet for Healthcare Providers: IncredibleEmployment.be  This test is not yet approved or cleared by the [place] FDA and has been authorized for detection and/or diagnosis of SARS-CoV-2 by FDA under an Emergency Use Authorization (EUA). This EUA will remain in effect (meaning this test can be used) for the duration of the COVID-19 declaration under Section 564(b)(1) of the Act, 21 U.S.C. section 360bbb-3(b)(1), unless the authorization is terminated or revoked.  Performed at [facility], [address] [place] [address] [place], [place] [address]   Urine Culture     Status: Abnormal   Collection Time: 08/02/22  5:48 PM   Specimen: Urine, Clean Catch  Result Value Ref Range Status   Specimen Description   Final    URINE,  CLEAN CATCH Performed at [facility], [address] [place], [place] [address]    Special Requests   Final    NONE Performed at [facility], [address], [place], [place] [address]    Culture (A)  Final    <10,000 COLONIES/mL INSIGNIFICANT GROWTH Performed at [facility], [place] [address], [place], [place] [address]    Report Status 08/03/2022 FINAL  Final  SARS Coronavirus 2 by RT PCR (hospital order, performed in [facility] hospital lab) *cepheid single result test* Anterior Nasal Swab     Status: None   Collection Time: 08/02/22  8:02 PM   Specimen: Anterior Nasal Swab  Result Value Ref Range Status   SARS Coronavirus 2 by RT PCR NEGATIVE NEGATIVE Final    Comment: (NOTE) SARS-CoV-2 target nucleic acids are NOT DETECTED.  The SARS-CoV-2 RNA is  generally detectable in upper and lower respiratory specimens during the acute phase of infection. The lowest concentration of SARS-CoV-2 viral copies this assay can detect is 250 copies / mL. A negative result does not preclude SARS-CoV-2 infection and should not be used as the sole basis for treatment or other patient management decisions.  A negative result may occur with improper specimen collection / handling, submission of specimen other than nasopharyngeal swab, presence of viral mutation(s) within the areas targeted by this assay, and inadequate number of viral copies (<250 copies / mL). A negative result must be combined with clinical observations, patient history, and epidemiological information.  Fact Sheet for Patients:   https://www.patel.info/  Fact Sheet for Healthcare Providers: https://hall.com/  This test is not yet approved or  cleared by the [place] FDA and has been authorized for detection and/or diagnosis of SARS-CoV-2 by FDA under an Emergency Use Authorization (EUA).  This EUA will remain in effect (meaning this test can be used) for the duration of  the COVID-19 declaration under Section 564(b)(1) of the Act, 21 U.S.C. section 360bbb-3(b)(1), unless the authorization is terminated or revoked sooner.  Performed at [facility], [address], [place], [place] [address]      Labs: BNP (last 3 results) No results for input(s): "BNP" in the last 8760 hours. Basic Metabolic Panel: Recent Labs  Lab 08/02/22 1736 08/03/22 0452 08/04/22 0412  NA 129* 129* 134*  K 3.2* 3.1* 3.3*  CL 87* 93* 101  CO2 '25 26 25  ' GLUCOSE 456* 366* 234*  BUN 24* 20 13  CREATININE 1.50* 0.94 0.72  CALCIUM 9.7 9.0 8.7*  MG  --  1.7 1.7  PHOS  --  2.1*  --    Liver Function Tests: Recent Labs  Lab 08/02/22 1736 08/03/22 0452 08/04/22 0412  AST 17 9* 9*  ALT '15 13 12  ' ALKPHOS 85 71 55  BILITOT 0.9 0.7 0.9  PROT 9.9* 8.2* 7.2  ALBUMIN 4.4 3.6 3.3*   Recent Labs  Lab 08/02/22 1736  LIPASE 48   No results for input(s): "AMMONIA" in the last 168 hours. CBC: Recent Labs  Lab 08/02/22 1736 08/03/22 0452 08/04/22 0412  WBC 21.4* 19.4* 16.0*  NEUTROABS 18.5*  --   --   HGB 14.4 13.3 12.1  HCT 45.3 41.1 37.3  MCV 69.1* 67.9* 68.3*  PLT 371 304 272   Cardiac Enzymes: No results for input(s): "CKTOTAL", "CKMB", "CKMBINDEX", "TROPONINI" in the last 168 hours. BNP: Invalid input(s): "POCBNP" CBG: Recent Labs  Lab 08/03/22 1114 08/03/22 1703 08/03/22 2205 08/04/22 0736 08/04/22 1116  GLUCAP 304* 162* 245* 218* 224*   D-Dimer No results for input(s): "DDIMER" in the last 72 hours. Hgb A1c No results for input(s): "HGBA1C" in the last 72 hours. Lipid Profile No results for input(s): "CHOL", "HDL", "LDLCALC", "TRIG", "CHOLHDL", "LDLDIRECT" in the last 72 hours. Thyroid function studies Recent Labs    08/02/22 1739  TSH 2.611   Anemia work up Recent Labs    08/03/22 0452  FERRITIN 359*  TIBC 233*  IRON 71   Urinalysis    Component Value Date/Time   COLORURINE YELLOW 08/02/2022 [place] CLOUDY (A)  08/02/2022 1748   LABSPEC 1.035 (H) 08/02/2022 1748   PHURINE 5.0 08/02/2022 1748   GLUCOSEU 150 (A) 08/02/2022 1748   HGBUR NEGATIVE 08/02/2022 1748   BILIRUBINUR NEGATIVE 08/02/2022 1748   KETONESUR 5 (A) 08/02/2022 1748   PROTEINUR >=300 (A) 08/02/2022 1748   UROBILINOGEN  0.2 10/10/2015 1446   NITRITE NEGATIVE 08/02/2022 1748   LEUKOCYTESUR NEGATIVE 08/02/2022 1748   Sepsis Labs Recent Labs  Lab 08/02/22 1736 08/03/22 0452 08/04/22 0412  WBC 21.4* 19.4* 16.0*   Microbiology Recent Results (from the past 240 hour(s))  Culture, blood (routine x 2)     Status: None (Preliminary result)   Collection Time: 08/02/22  5:38 PM   Specimen: Left Antecubital; Blood  Result Value Ref Range Status   Specimen Description LEFT ANTECUBITAL  Final   Special Requests   Final    BOTTLES DRAWN AEROBIC AND ANAEROBIC Blood Culture adequate volume   Culture   Final    NO GROWTH 2 DAYS Performed at [facility], [address] [place], [place] [address]    Report Status PENDING  Incomplete  Culture, blood (routine x 2)     Status: None (Preliminary result)   Collection Time: 08/02/22  5:43 PM   Specimen: BLOOD LEFT HAND  Result Value Ref Range Status   Specimen Description BLOOD LEFT HAND  Final   Special Requests   Final    BOTTLES DRAWN AEROBIC AND ANAEROBIC Blood Culture adequate volume   Culture   Final    NO GROWTH 2 DAYS Performed at [facility], [address] [place], [place] [address]    Report Status PENDING  Incomplete  Resp Panel by RT-PCR (Flu A&B, Covid) Anterior Nasal Swab     Status: None   Collection Time: 08/02/22  5:48 PM   Specimen: Anterior Nasal Swab  Result Value Ref Range Status   SARS Coronavirus 2 by RT PCR NEGATIVE NEGATIVE Final    Comment: (NOTE) SARS-CoV-2 target nucleic acids are NOT DETECTED.  The SARS-CoV-2 RNA is generally detectable in upper respiratory specimens during the acute phase of infection. The lowest concentration of SARS-CoV-2 viral  copies this assay can detect is 138 copies/mL. A negative result does not preclude SARS-Cov-2 infection and should not be used as the sole basis for treatment or other patient management decisions. A negative result may occur with  improper specimen collection/handling, submission of specimen other than nasopharyngeal swab, presence of viral mutation(s) within the areas targeted by this assay, and inadequate number of viral copies(<138 copies/mL). A negative result must be combined with clinical observations, patient history, and epidemiological information. The expected result is Negative.  Fact Sheet for Patients:  EntrepreneurPulse.com.au  Fact Sheet for Healthcare Providers:  IncredibleEmployment.be  This test is no t yet approved or cleared by the [place] FDA and  has been authorized for detection and/or diagnosis of SARS-CoV-2 by FDA under an Emergency Use Authorization (EUA). This EUA will remain  in effect (meaning this test can be used) for the duration of the COVID-19 declaration under Section 564(b)(1) of the Act, 21 U.S.C.section 360bbb-3(b)(1), unless the authorization is terminated  or revoked sooner.       Influenza A by PCR NEGATIVE NEGATIVE Final   Influenza B by PCR NEGATIVE NEGATIVE Final    Comment: (NOTE) The Xpert Xpress SARS-CoV-2/FLU/RSV plus assay is intended as an aid in the diagnosis of influenza from Nasopharyngeal swab specimens and should not be used as a sole basis for treatment. Nasal washings and aspirates are unacceptable for Xpert Xpress SARS-CoV-2/FLU/RSV testing.  Fact Sheet for Patients: EntrepreneurPulse.com.au  Fact Sheet for Healthcare Providers: IncredibleEmployment.be  This test is not yet approved or cleared by the [place] FDA and has been authorized for detection and/or diagnosis of SARS-CoV-2 by FDA under an Emergency Use  Authorization (EUA). This  EUA will remain in effect (meaning this test can be used) for the duration of the COVID-19 declaration under Section 564(b)(1) of the Act, 21 U.S.C. section 360bbb-3(b)(1), unless the authorization is terminated or revoked.  Performed at [facility], [address], [place], [place] [address]   Urine Culture     Status: Abnormal   Collection Time: 08/02/22  5:48 PM   Specimen: Urine, Clean Catch  Result Value Ref Range Status   Specimen Description   Final    URINE, CLEAN CATCH Performed at [facility], [address] [place], [place] [address]    Special Requests   Final    NONE Performed at [facility], [address], [place], [place] [address]    Culture (A)  Final    <10,000 COLONIES/mL INSIGNIFICANT GROWTH Performed at [facility], [place] [address], [place], [place] [address]    Report Status 08/03/2022 FINAL  Final  SARS Coronavirus 2 by RT PCR (hospital order, performed in [facility] hospital lab) *cepheid single result test* Anterior Nasal Swab     Status: None   Collection Time: 08/02/22  8:02 PM   Specimen: Anterior Nasal Swab  Result Value Ref Range Status   SARS Coronavirus 2 by RT PCR NEGATIVE NEGATIVE Final    Comment: (NOTE) SARS-CoV-2 target nucleic acids are NOT DETECTED.  The SARS-CoV-2 RNA is generally detectable in upper and lower respiratory specimens during the acute phase of infection. The lowest concentration of SARS-CoV-2 viral copies this assay can detect is 250 copies / mL. A negative result does not preclude SARS-CoV-2 infection and should not be used as the sole basis for treatment or other patient management decisions.  A negative result may occur with improper specimen collection / handling, submission of specimen other than nasopharyngeal swab, presence of viral mutation(s) within the areas targeted by this assay, and inadequate number of viral copies (<250 copies / mL). A negative result must be combined with clinical observations,  patient history, and epidemiological information.  Fact Sheet for Patients:   https://www.patel.info/  Fact Sheet for Healthcare Providers: https://hall.com/  This test is not yet approved or  cleared by the [place] FDA and has been authorized for detection and/or diagnosis of SARS-CoV-2 by FDA under an Emergency Use Authorization (EUA).  This EUA will remain in effect (meaning this test can be used) for the duration of the COVID-19 declaration under Section 564(b)(1) of the Act, 21 U.S.C. section 360bbb-3(b)(1), unless the authorization is terminated or revoked sooner.  Performed at [facility], [address] [place], [place] [address]      Time coordinating discharge: 35 minutes  SIGNED:   Rodena Goldmann, DO [facility] 08/04/2022, 2:04 PM  If 7PM-7AM, please contact night-coverage [email or web address]

## 2022-08-07 LAB — CULTURE, BLOOD (ROUTINE X 2)
Culture: NO GROWTH
Culture: NO GROWTH
Special Requests: ADEQUATE
Special Requests: ADEQUATE

## 2022-08-11 ENCOUNTER — Encounter: Payer: Self-pay | Admitting: Nurse Practitioner

## 2022-08-11 ENCOUNTER — Ambulatory Visit (INDEPENDENT_AMBULATORY_CARE_PROVIDER_SITE_OTHER): Payer: Medicare Other | Admitting: Nurse Practitioner

## 2022-08-11 VITALS — BP 132/78 | HR 83 | Ht 67.0 in | Wt 217.0 lb

## 2022-08-11 DIAGNOSIS — R6510 Systemic inflammatory response syndrome (SIRS) of non-infectious origin without acute organ dysfunction: Secondary | ICD-10-CM | POA: Diagnosis not present

## 2022-08-11 DIAGNOSIS — Z23 Encounter for immunization: Secondary | ICD-10-CM

## 2022-08-11 DIAGNOSIS — K529 Noninfective gastroenteritis and colitis, unspecified: Secondary | ICD-10-CM

## 2022-08-11 DIAGNOSIS — N179 Acute kidney failure, unspecified: Secondary | ICD-10-CM | POA: Diagnosis not present

## 2022-08-11 DIAGNOSIS — M79672 Pain in left foot: Secondary | ICD-10-CM | POA: Insufficient documentation

## 2022-08-11 DIAGNOSIS — Z09 Encounter for follow-up examination after completed treatment for conditions other than malignant neoplasm: Secondary | ICD-10-CM

## 2022-08-11 DIAGNOSIS — M79675 Pain in left toe(s): Secondary | ICD-10-CM | POA: Diagnosis not present

## 2022-08-11 NOTE — Assessment & Plan Note (Signed)
Left fifth toe pain that started about a week ago Affected toe appears discolored sensation is intact no redness or swelling noted.  Due to her history of diabetes I referred patient to podiatry today

## 2022-08-11 NOTE — Assessment & Plan Note (Signed)
Lab Results  Component Value Date   NA 134 (L) 08/04/2022   K 3.3 (L) 08/04/2022   CO2 25 08/04/2022   GLUCOSE 234 (H) 08/04/2022   BUN 13 08/04/2022   CREATININE 0.72 08/04/2022   CALCIUM 8.7 (L) 08/04/2022   EGFR 85 03/03/2022   GFRNONAA >60 08/04/2022  AKI now resolved Patient denies urinary symptoms Drink at least 64 ounces of water daily to maintain hydration

## 2022-08-11 NOTE — Patient Instructions (Signed)
Please get your shingles vaccine , TDAP vaccine at the pharmacy..  It is important that you exercise regularly at least 30 minutes 5 times a week.  Think about what you will eat, plan ahead. Choose " clean, green, fresh or frozen" over canned, processed or packaged foods which are more sugary, salty and fatty. 70 to 75% of food eaten should be vegetables and fruit. Three meals at set times with snacks allowed between meals, but they must be fruit or vegetables. Aim to eat over a 12 hour period , example 7 am to 7 pm, and STOP after  your last meal of the day. Drink water,generally about 64 ounces per day, no other drink is as healthy. Fruit juice is best enjoyed in a healthy way, by EATING the fruit.  Thanks for choosing [facility], we consider it a privelige to serve you.

## 2022-08-11 NOTE — Assessment & Plan Note (Signed)
Recently on admission at the hospital from 08/02/2022 to 08/04/2022 for gastroenteritis and AKI.  Patient denies any abdominal pain, nausea, vomiting, fever, chills Hospital after  visit summary, labs and imaging results reviewed by me today.

## 2022-08-11 NOTE — Assessment & Plan Note (Signed)
Now completely resolved Patient denies nausea vomiting abdominal pain today

## 2022-08-11 NOTE — Assessment & Plan Note (Signed)
Patient educated on CDC recommendation for the vaccine. Verbal consent was obtained from the patient, vaccine administered by nurse, no sign of adverse reactions noted at this time. Patient education on arm soreness and use of tylenol  for this patient  was discussed. Patient educated on the signs and symptoms of adverse effect and advise to contact the office if they occur. Vaccine information sheet given to patient.  

## 2022-08-11 NOTE — Assessment & Plan Note (Signed)
SARS due to gastroenteritis Not completely resolved Patient denies fever chills abdominal pain nausea vomiting Vital signs are normal in the office today

## 2022-08-11 NOTE — Progress Notes (Signed)
Established Patient Office Visit  Subjective   Patient ID: Shawna Huerta, female    DOB: [birth year]  Age: 61 y.o. MRN: [id number]  Chief Complaint  Patient presents with   Follow-up    F/u   Toe Pain    Left foot pain for a week since 08/04/22   Shawna Huerta with past medical history of essential hypertension, left leg DVT, type 2 diabetes, hyperlipidemia presents  for follow-up for hospital admission for acute gastroenteritis. She was on admission from 08/02/2022 to 08/04/2022 treated with IV cefepime and vancomycin.  She developed AKI while she was at the hospital, her kidney function was back to baseline at discharge.  Patient currently denies abdominal pain, nausea, vomiting, urinary hesitancy, retention   Patient complains of left foot little toe pain and skin doscoloration since about a week ago. Has aching pain 2/10. Denies trauma, numbness, tingling     Review of Systems  Constitutional: Negative.  Negative for chills, fever and weight loss.  Respiratory: Negative.  Negative for cough, hemoptysis, sputum production and shortness of breath.   Cardiovascular: Negative.  Negative for chest pain, palpitations, orthopnea, claudication and leg swelling.  Gastrointestinal:  Negative for abdominal pain, blood in stool, constipation, diarrhea, heartburn, nausea and vomiting.  Musculoskeletal:  Positive for joint pain. Negative for back pain, myalgias and neck pain.  Neurological: Negative.   Psychiatric/Behavioral: Negative.  Negative for depression, hallucinations, substance abuse and suicidal ideas.       Objective:     BP 132/78 (BP Location: Left Arm, Patient Position: Sitting, Cuff Size: Large)   Pulse 83   Ht _0  (1.702 m)   Wt 217 lb (98.4 kg)   SpO2 96%   BMI 33.99 kg/m    Physical Exam Constitutional:      General: She is not in acute distress.    Appearance: She is obese. She is not ill-appearing, toxic-appearing or diaphoretic.  Cardiovascular:     Rate and Rhythm:  Normal rate and regular rhythm.     Pulses: Normal pulses.     Heart sounds: Normal heart sounds. No murmur heard.    No friction rub. No gallop.  Pulmonary:     Effort: Pulmonary effort is normal. No respiratory distress.     Breath sounds: Normal breath sounds. No stridor. No wheezing, rhonchi or rales.  Chest:     Chest wall: No tenderness.  Abdominal:     General: There is no distension.     Palpations: Abdomen is soft. There is no mass.     Tenderness: There is no abdominal tenderness. There is no guarding.  Musculoskeletal:        General: Tenderness present. No swelling, deformity or signs of injury. Normal range of motion.     Right lower leg: No edema.     Left lower leg: No edema.  Skin:    General: Skin is warm and dry.     Coloration: Skin is not jaundiced or pale.     Findings: No bruising, erythema, lesion or rash.     Comments: Left fifth toe painful to touch, skin discoloration noted, sensation is intact.  No swelling or redness noted, has diminished pedal pulse skin warm and dry  Neurological:     Mental Status: She is alert and oriented to person, place, and time.     Cranial Nerves: No cranial nerve deficit.     Sensory: No sensory deficit.     Motor: No weakness.  Coordination: Coordination normal.     Gait: Gait normal.     Deep Tendon Reflexes: Reflexes normal.  Psychiatric:        Mood and Affect: Mood normal.        Behavior: Behavior normal.        Thought Content: Thought content normal.        Judgment: Judgment normal.      No results found for any visits on 08/11/22.    The ASCVD Risk score (Arnett DK, et al., 2019) failed to calculate for the following reasons:   The valid total cholesterol range is 130 to 320 mg/dL    Assessment & Plan:   Problem List Items Addressed This Visit       Digestive   Acute gastroenteritis - Primary    Now completely resolved Patient denies nausea vomiting abdominal pain today        Genitourinary    AKI (acute kidney injury) ([place])    Lab Results  Component Value Date   NA 134 (L) 08/04/2022   K 3.3 (L) 08/04/2022   CO2 25 08/04/2022   GLUCOSE 234 (H) 08/04/2022   BUN 13 08/04/2022   CREATININE 0.72 08/04/2022   CALCIUM 8.7 (L) 08/04/2022   EGFR 85 03/03/2022   GFRNONAA >60 08/04/2022  AKI now resolved Patient denies urinary symptoms Drink at least 64 ounces of water daily to maintain hydration        Other   Need for immunization against influenza    Patient educated on CDC recommendation for the vaccine. Verbal consent was obtained from the patient, vaccine administered by nurse, no sign of adverse reactions noted at this time. Patient education on arm soreness and use of tylenol for this patient  was discussed. Patient educated on the signs and symptoms of adverse effect and advise to contact the office if they occur. Vaccine information sheet given to patient.       Relevant Orders   Flu Vaccine QUAD 103moIM (Fluarix, Fluzone & Alfiuria Quad PF) (Completed)   SIRS (systemic inflammatory response syndrome) (HCC)    SARS due to gastroenteritis Not completely resolved Patient denies fever chills abdominal pain nausea vomiting Vital signs are normal in the office today      Toe pain, left    Left fifth toe pain that started about a week ago Affected toe appears discolored sensation is intact no redness or swelling noted.  Due to her history of diabetes I referred patient to podiatry today      Relevant Orders   [facility]    Recently on admission at the hospital from 08/02/2022 to 08/04/2022 for gastroenteritis and AKI.  Patient denies any abdominal pain, nausea, vomiting, fever, chills Hospital after  visit summary, labs and imaging results reviewed by me today.       No follow-ups on file.    FRenee Rival FNP

## 2022-08-25 ENCOUNTER — Ambulatory Visit (INDEPENDENT_AMBULATORY_CARE_PROVIDER_SITE_OTHER): Payer: Medicare Other

## 2022-08-25 ENCOUNTER — Ambulatory Visit (INDEPENDENT_AMBULATORY_CARE_PROVIDER_SITE_OTHER): Payer: Medicare Other | Admitting: Podiatry

## 2022-08-25 DIAGNOSIS — M7752 Other enthesopathy of left foot: Secondary | ICD-10-CM

## 2022-08-25 DIAGNOSIS — M792 Neuralgia and neuritis, unspecified: Secondary | ICD-10-CM | POA: Diagnosis not present

## 2022-08-25 DIAGNOSIS — M79672 Pain in left foot: Secondary | ICD-10-CM

## 2022-08-25 MED ORDER — GABAPENTIN 300 MG PO CAPS: 300.0000 mg | ORAL_CAPSULE | Freq: Three times a day (TID) | ORAL | 0 refills | Status: DC

## 2022-08-25 NOTE — Progress Notes (Signed)
Subjective:  Patient ID: Shawna Huerta, female    DOB: [birth year],  MRN: [id number]  Chief Complaint  Patient presents with   Foot Pain    Left foot, 5th digit pain    61 y.o. female presents with pain in her left fifth toe.  She says she has a painful area in between the fourth and the fifth toe on the inside of the toe.  There is callus present at the area.  The toe also seems to curve over and contract.  Also relates burning pain does have a history of diabetes type 2.  This pain seems to awaken her mostly at night.  Unsure if she has neuropathy.  She does report some diminished sensation in the forefoot.  Past Medical History:  Diagnosis Date   Anemia    Arthritis    Bartholin cyst 07/03/2014   Colitis 08/2015   Treated at [facility]   Diabetes mellitus    Hypertension    Labial abscess 08/2015   Status post I&D by Dr. Ladona Horns   Nicotine addiction 09/03/2013   Obesity    Pancreatitis 09/2015   unknown etiology, no further work-up with gastroenterology    Vaginal itching 09/03/2013    Allergies  Allergen Reactions   Iohexol Swelling     Code: HIVES, Desc: PT STATES THE EYES SWOLL AND ITCHED, NEEDS PRE MEDS     ROS: Negative except as per HPI above  Objective:  General: AAO x3, NAD  Dermatological: With inspection and palpation of the right and left lower extremities there are no open sores, no preulcerative lesions, no rash or signs of infection present. Nails are of normal length thickness and coloration.   Vascular:  Dorsalis Pedis artery and Posterior Tibial artery pedal pulses are 2/4 bilateral.  Capillary fill time brisk < 3 sec. Pedal hair growth present. No varicosities and no lower extremity edema present bilateral. There is no pain with calf compression, swelling, warmth, erythema.   Neruologic: Grossly intact via light touch bilateral. Protective threshold intact to all sites bilateral. Patellar and Achilles deep tendon reflexes 2+ bilateral. Negative  Babinski reflex.   Musculoskeletal: Mild adductovarus deformity of the fifth toe left foot with prominence of the proximal interphalangeal joint  Gait: Unassisted, Nonantalgic.   No images are attached to the encounter.  Radiographs:  Date: August 25 2022 XR left foot weightbearing AP/Lateral/Oblique   Findings: mild adductovarus rotation of the fifth toe no evidence of fracture dislocation or other acute osseous injury  Assessment:   1. Foot pain, left   2. Neuropathic pain of foot, left   3. Bone spur of left foot      Plan:  Patient was evaluated and treated and all questions answered.  #Neuropathic pain of left foot -I do believe the patient is experiencing some neuropathic pain related to her type 2 diabetes.  This is related to her subjective complaints of burning tingling pins-and-needles type sensation especially in the evening or at night which is causing her to lose sleep. -I recommend a trial of gabapentin 300 mg take once at night to begin with.  If this relieves her symptoms she can continue with just the once daily.  If not add in a another tablet in the morning or midday. -Discussed risks of gabapentin including drowsiness and she should not operate or drive while taking the medication until she knows how it affects her.  Discontinue with any sign of side effects.  #Bone spur of left foot. -Discussed  that there may be a bone spur present in the fifth proximal interphalangeal joint related to arthritic changes. -Recommend gel spacer for this issue. -Surgical intervention could be considered which would include derotational arthroplasty of the fifth proximal interphalangeal joint left foot.  Patient wishes to defer for now we will continue with conservative care  Return in about 6 weeks (around 10/06/2022) for Left 5th toe pain.          Everitt Amber, DPM [facility]

## 2022-08-26 ENCOUNTER — Other Ambulatory Visit: Payer: Self-pay | Admitting: Podiatry

## 2022-08-26 DIAGNOSIS — M792 Neuralgia and neuritis, unspecified: Secondary | ICD-10-CM

## 2022-09-08 IMAGING — CT CT ABD-PELV W/O CM
2 of 4 series · 16 of 46 positions shown, 18 images · non-contrast
Comparison: CT dated July 05, 2019

CLINICAL DATA: Abdominal pain, acute, nonlocalized



[Series 2: axial st · axial · 0.79mm/px · z∈[+1096,+1471]mm · 13 of 86 slices shown, 15 images]
[im 6/86  soft-tissue]
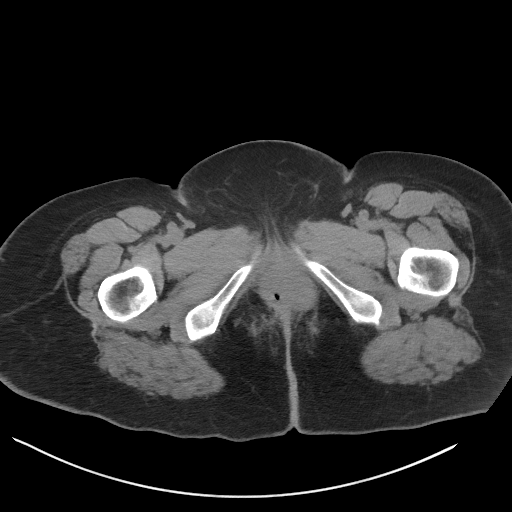
[im 6/86  bone]
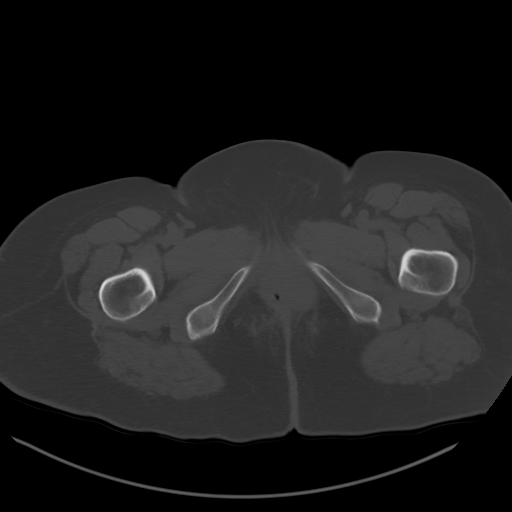
[im 11/86  soft-tissue]
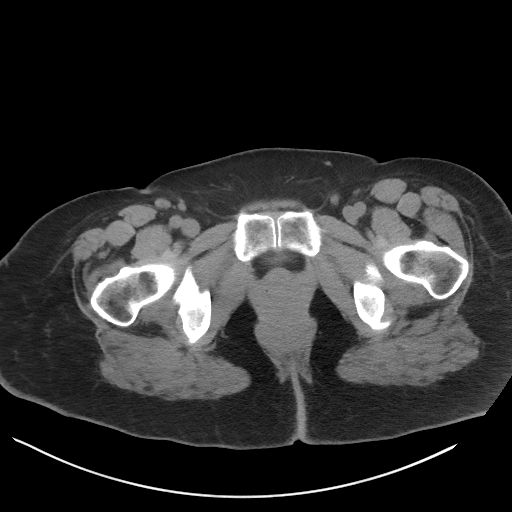
[im 21/86  soft-tissue]
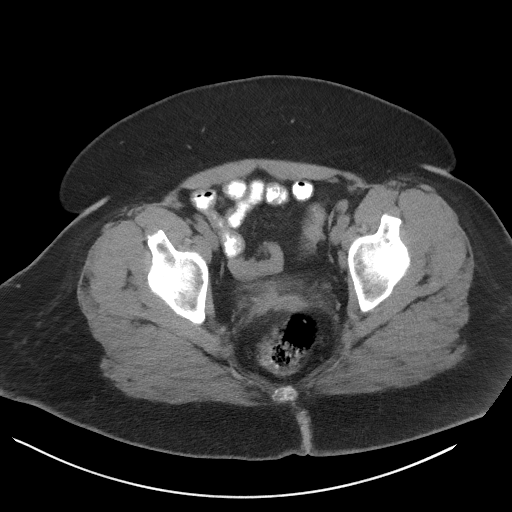
[im 26/86  soft-tissue]
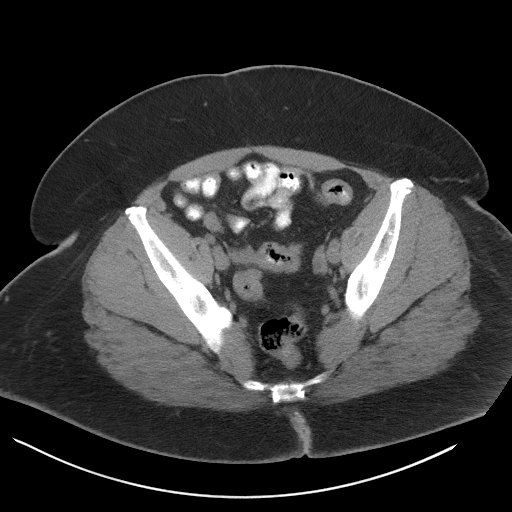
[im 31/86  soft-tissue]
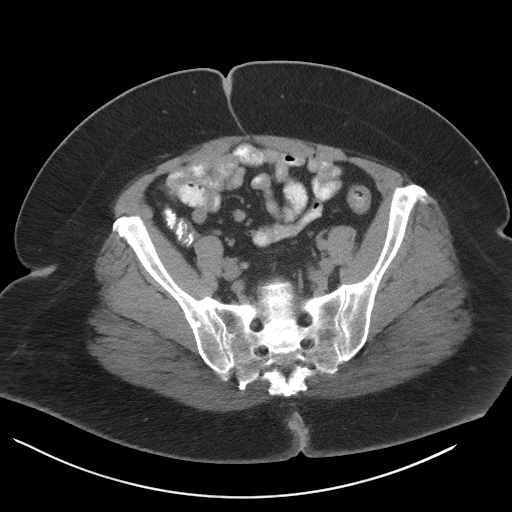
[im 36/86  soft-tissue]
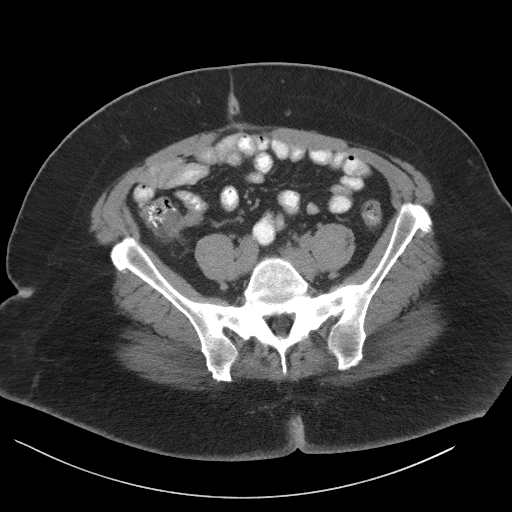
[im 46/86  soft-tissue]
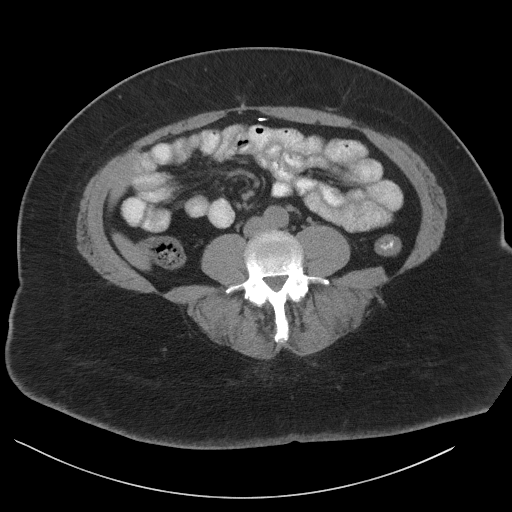
[im 51/86  soft-tissue]
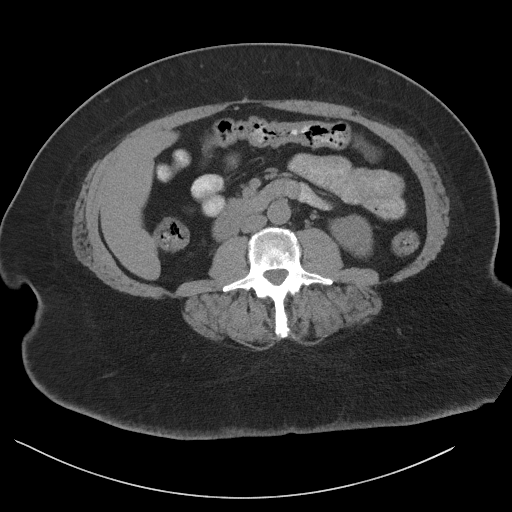
[im 56/86  soft-tissue]
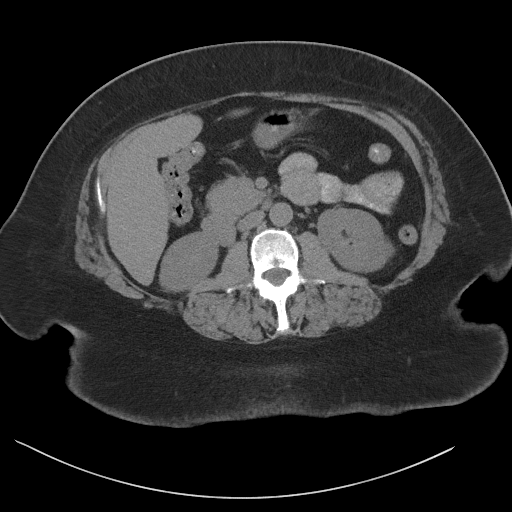
[im 56/86  bone]
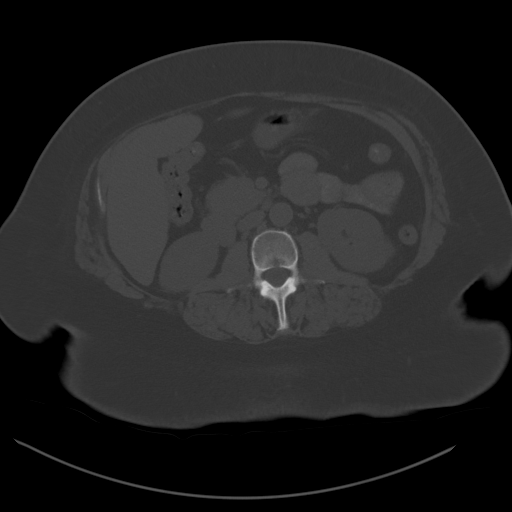
[im 61/86  soft-tissue]
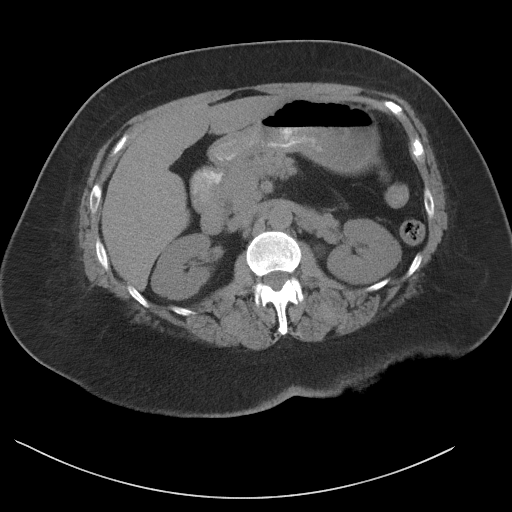
[im 66/86  soft-tissue]
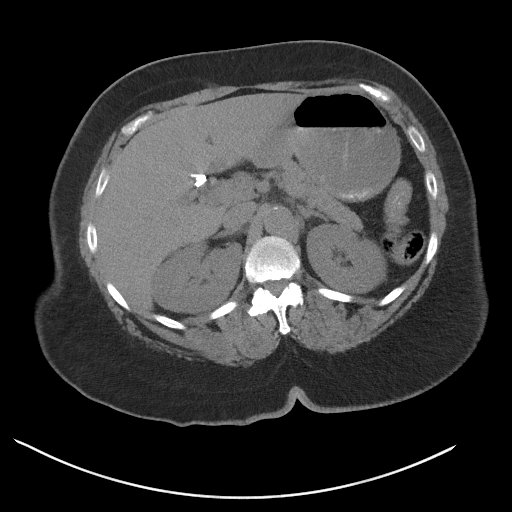
[im 76/86  soft-tissue]
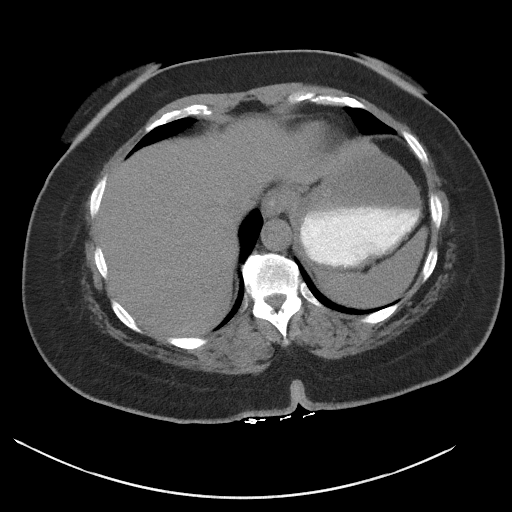
[im 81/86  soft-tissue]
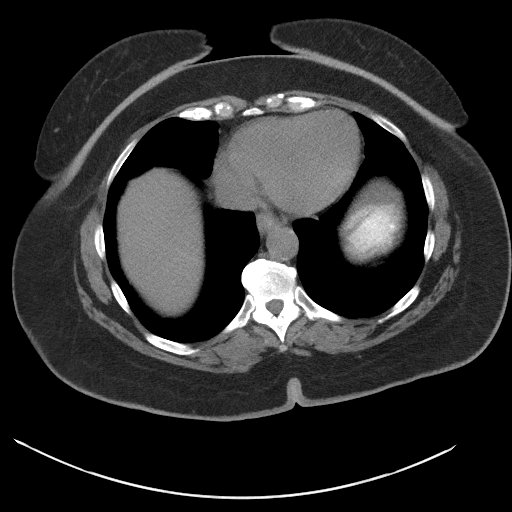

[Series 5: coronal st · coronal · 0.78mm/px · 3 of 116 slices shown]
[im 39/116  soft-tissue]
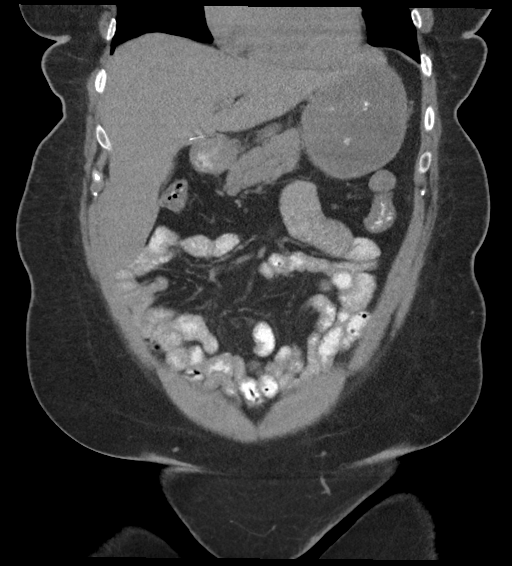
[im 52/116  soft-tissue]
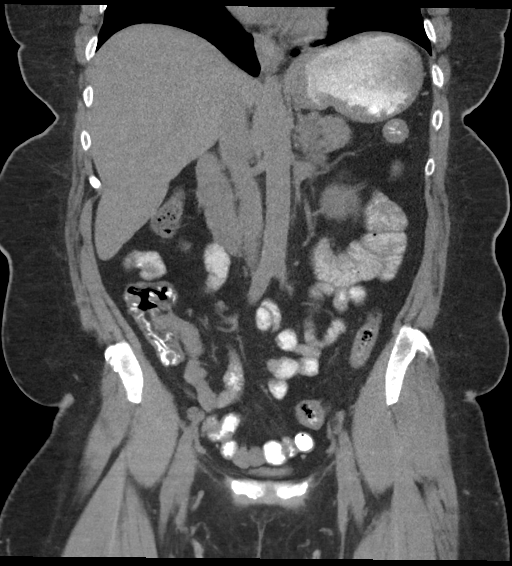
[im 64/116  soft-tissue]
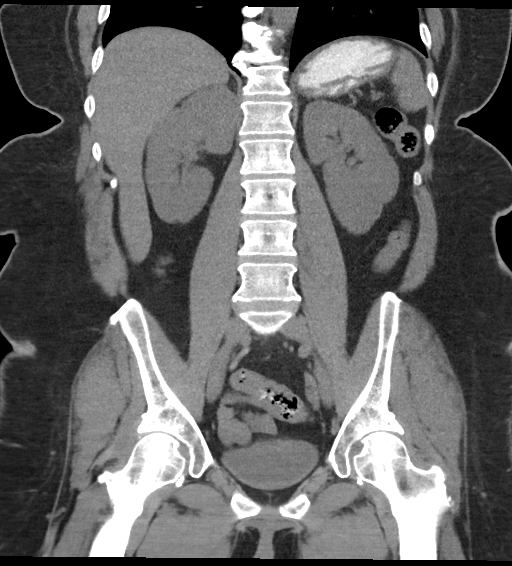

[16 of 46 positions shown; findings below may reference images not displayed]

FINDINGS: Evaluation is limited by lack IV contrast.

Lower chest: No acute abnormality.

Hepatobiliary: Status post cholecystectomy. Focal fatty deposition
adjacent to the falciform ligament.

Pancreas: No peripancreatic fat stranding.

Spleen: Unremarkable.

Adrenals/Urinary Tract: Adrenal glands are unremarkable. No
hydronephrosis. No obstructing nephrolithiasis. Bladder is
decompressed. There is a punctate nonobstructive LEFT-sided
nephrolithiasis

Stomach/Bowel: No evidence of bowel obstruction. Scattered
diverticulosis. Mild diffuse prominence of the sigmoid and
descending colon walls. Appendix is normal. Stomach is unremarkable.

Vascular/Lymphatic: No significant vascular findings are present. No
enlarged abdominal or pelvic lymph nodes.

Reproductive: Status post hysterectomy. No adnexal masses.

Other: No free air or free fluid.

Musculoskeletal: No acute or significant osseous findings.
IMPRESSION: 1. Mild prominence of predominately the sigmoid colon walls. This
could reflect a nonspecific colitis in the appropriate clinical
setting. Recommend correlation with colon cancer screening history.
No evidence of acute diverticulitis.

## 2022-09-22 ENCOUNTER — Other Ambulatory Visit: Payer: Self-pay | Admitting: Podiatry

## 2022-10-06 ENCOUNTER — Ambulatory Visit (INDEPENDENT_AMBULATORY_CARE_PROVIDER_SITE_OTHER): Payer: Medicare Other | Admitting: Podiatry

## 2022-10-06 DIAGNOSIS — M792 Neuralgia and neuritis, unspecified: Secondary | ICD-10-CM

## 2022-10-06 DIAGNOSIS — M7752 Other enthesopathy of left foot: Secondary | ICD-10-CM | POA: Diagnosis not present

## 2022-10-06 DIAGNOSIS — M79672 Pain in left foot: Secondary | ICD-10-CM | POA: Diagnosis not present

## 2022-10-06 DIAGNOSIS — Z794 Long term (current) use of insulin: Secondary | ICD-10-CM

## 2022-10-06 DIAGNOSIS — I739 Peripheral vascular disease, unspecified: Secondary | ICD-10-CM

## 2022-10-06 DIAGNOSIS — E1151 Type 2 diabetes mellitus with diabetic peripheral angiopathy without gangrene: Secondary | ICD-10-CM | POA: Diagnosis not present

## 2022-10-06 NOTE — Progress Notes (Signed)
Subjective:  Patient ID: Shawna Huerta, female    DOB: [birth year],  MRN: [id number]  Chief Complaint  Patient presents with   Follow-up    Left 4th and 5th toe pain follow up. Patient continues to has pain. Sensitive to touch occasionally. Patient is taking Gabapentin twice a day.      61 y.o. female presents with concern in pain in the left fourth and fifth toe.  Primarily in the fifth toe.  She is taking gabapentin 300 mg twice a day.  She is thinking about increasing to 3 times a day as the medication has been somewhat effective in helping with her pain but is still having a lot of pain in that area of the left foot.  She describes the pain as a burning tingling pins-and-needles type sensation says that it wakes her up a lot at night.  Past Medical History:  Diagnosis Date   Anemia    Arthritis    Bartholin cyst 07/03/2014   Colitis 08/2015   Treated at [facility]   Diabetes mellitus    Hypertension    Labial abscess 08/2015   Status post I&D by Dr. Ladona Horns   Nicotine addiction 09/03/2013   Obesity    Pancreatitis 09/2015   unknown etiology, no further work-up with gastroenterology    Vaginal itching 09/03/2013    Allergies  Allergen Reactions   Iohexol Swelling     Code: HIVES, Desc: PT STATES THE EYES SWOLL AND ITCHED, NEEDS PRE MEDS     ROS: Negative except as per HPI above  Objective:  General: AAO x3, NAD  Dermatological: Mild darkened discoloration of the left fourth and fifth toe compared to surrounding tissue.  Dry peeling skin present at the distal tuft of the left fifth toe  Vascular:  Dorsalis Pedis artery and Posterior Tibial artery pedal pulses are nonpalpable on the left lower extremity.  Capillary refill diminished to the fifth toe left foot  Neruologic: Grossly intact via light touch bilateral. Protective threshold intact to all sites bilateral.   Musculoskeletal: Pain with palpation of the distal tuft of the left fifth toe  Gait: Unassisted,  Nonantalgic.    Assessment:   1. Type 2 diabetes mellitus with diabetic peripheral angiopathy without gangrene, with long-term current use of insulin ([place])   2. Neuropathic pain of foot, left   3. Foot pain, left   4. Bone spur of left foot   5. PAD (peripheral artery disease) ([place])      Plan:  Patient was evaluated and treated and all questions answered.  #Concern for ischemic pain related to peripheral arterial disease of the left lower extremity versus neuropathic pain of the left lower extremity #DM2 with neuropathy and angiopathy -Discussed with the patient that I would like her to increase her gabapentin to 300 mg 3 times a day.  We will see if this has any additional benefit for pain. -Additionally in regards to the diminished pulses of the left lower extremity as well as concern for some ischemic changes in the fourth and fifth toe I am referring her to vascular surgery and ordering ABI testing to evaluate for claudication ischemic pain and early gangrene in the fourth and fifth toes on the left foot. -Patient will follow-up in 6 weeks to discuss results of both vascular testing as well as evaluate effectiveness of gabapentin.  Return in about 6 weeks (around 11/17/2022) for Follow-up left fourth and fifth toe pain.  Everitt Amber, DPM [facility]

## 2022-10-07 ENCOUNTER — Ambulatory Visit (HOSPITAL_COMMUNITY)
Admission: RE | Admit: 2022-10-07 | Discharge: 2022-10-07 | Disposition: A | Discharge status: Home / Self Care | Payer: Medicare Other | Source: Ambulatory Visit | Attending: Podiatry | Admitting: Podiatry

## 2022-10-07 DIAGNOSIS — I739 Peripheral vascular disease, unspecified: Secondary | ICD-10-CM | POA: Insufficient documentation

## 2022-10-07 DIAGNOSIS — Z794 Long term (current) use of insulin: Secondary | ICD-10-CM | POA: Diagnosis present

## 2022-10-07 DIAGNOSIS — E1151 Type 2 diabetes mellitus with diabetic peripheral angiopathy without gangrene: Secondary | ICD-10-CM | POA: Diagnosis present

## 2022-10-11 ENCOUNTER — Ambulatory Visit: Payer: 59 | Admitting: Nurse Practitioner

## 2022-10-11 ENCOUNTER — Ambulatory Visit (INDEPENDENT_AMBULATORY_CARE_PROVIDER_SITE_OTHER): Payer: Medicare Other | Admitting: Family Medicine

## 2022-10-11 ENCOUNTER — Encounter: Payer: Self-pay | Admitting: Family Medicine

## 2022-10-11 VITALS — BP 124/86 | HR 84 | Ht 67.0 in | Wt 225.0 lb

## 2022-10-11 DIAGNOSIS — E559 Vitamin D deficiency, unspecified: Secondary | ICD-10-CM

## 2022-10-11 DIAGNOSIS — E038 Other specified hypothyroidism: Secondary | ICD-10-CM

## 2022-10-11 DIAGNOSIS — I10 Essential (primary) hypertension: Secondary | ICD-10-CM

## 2022-10-11 DIAGNOSIS — E1169 Type 2 diabetes mellitus with other specified complication: Secondary | ICD-10-CM

## 2022-10-11 DIAGNOSIS — E785 Hyperlipidemia, unspecified: Secondary | ICD-10-CM

## 2022-10-11 DIAGNOSIS — M79672 Pain in left foot: Secondary | ICD-10-CM

## 2022-10-11 DIAGNOSIS — E119 Type 2 diabetes mellitus without complications: Secondary | ICD-10-CM

## 2022-10-11 MED ORDER — IBUPROFEN 800 MG PO TABS: 800.0000 mg | ORAL_TABLET | Freq: Three times a day (TID) | ORAL | 0 refills | Status: DC | PRN

## 2022-10-11 MED ORDER — METFORMIN HCL 500 MG PO TABS: 500.0000 mg | ORAL_TABLET | Freq: Every day | ORAL | 3 refills | Status: DC

## 2022-10-11 NOTE — Assessment & Plan Note (Signed)
Will order Motrin 800 mg every 8 hours as needed for pain Encourage patient to continue taking gabapentin 300 mg 3 times daily as prescribed Encouraged to continue to following up with her podiatrist as scheduled

## 2022-10-11 NOTE — Progress Notes (Signed)
Established Patient Office Visit  Subjective:  Patient ID: Shawna Huerta, female    DOB: [birth year]  Age: 61 y.o. MRN: [id number]  CC:  Chief Complaint  Patient presents with   Follow-up    4 month f/u, states last night her sugar dropped took her 4 hours to get it back up. Has been consistent with blood pressure and cholesterol meds. Reports seeing foot specialist, wont be back to see them until 6 weeks, c/o left foot pain on her toe.     HPI Shawna Huerta is a 60 y.o. female with past medical history of diabetes, hypertension, and hyperlipidemia presents for f/u of  chronic medical conditions.  Type 2 diabetes: She takes glyburide-metformin 5-500 mg daily.  She reports that she has been having frequent hypoglycemia spells, noting the most frequent episode on 10/10/22.  She reports that her blood sugar was 41 last night; she ate peanut butter crackers and drank cranberry juice, which elevated her blood sugar to 82.  She denies polyuria, polyphagia, and polydipsia.  Hypertension: She takes amlodipine 5 mg daily and lisinopril 20 mg daily.  She denies headaches, dizziness, blurry vision, chest pain, palpitation, and shortness of breath.  She reports adherence to treatment regimen.  Hyperlipidemia: She takes atorvastatin 10 mg daily.  She denies muscle aches and reports adherence to the treatment regimen.  Left Foot Pain: She followed up with her podiatrist on 08/25/2022 with similar complaints of a sharp pain between the fourth and fifth toes that waxes and wanes. She had an X-ray of the left foot weightbearing AP/Lateral/Oblique on 08/25/22 with findings of  mild adductovarus rotation of the fifth toe with no evidence of fracture dislocation or other acute osseous injury.  She reports that she recently had an ultrasound of the affected foot on 10/07/2022.  She was started on gabapentin 300 mg 3 times daily for her foot pain with mild relief of her symptoms.  Past Medical History:  Diagnosis Date    Anemia    Arthritis    Bartholin cyst 07/03/2014   Colitis 08/2015   Treated at [facility]   Diabetes mellitus    Hypertension    Labial abscess 08/2015   Status post I&D by Dr. Ladona Horns   Nicotine addiction 09/03/2013   Obesity    Pancreatitis 09/2015   unknown etiology, no further work-up with gastroenterology    Vaginal itching 09/03/2013    Past Surgical History:  Procedure Laterality Date   ABDOMINAL HYSTERECTOMY     ABDOMINAL SURGERY     exploratry with TAH   BALLOON DILATION N/A 10/26/2016   Procedure: Pyloric channel BALLOON DILATION;  Surgeon: Daneil Dolin, MD;  Location: [facility];  Service: Endoscopy;  Laterality: N/A;   BARTHOLIN GLAND CYST EXCISION Right 08/20/2014   Procedure: EXCISION OF RIGHT BARTHOLIN'S GLAND CYST;  Surgeon: Florian Buff, MD;  Location: [facility] OR[facility];  Service: Gynecology;  Laterality: Right;   CHOLECYSTECTOMY     COLONOSCOPY  10/2015   Doristine Mango: mild diverticulosis, at least 8 polpys removed, multiple tubular adenomas. next tcs in 1 year   ESOPHAGOGASTRODUODENOSCOPY N/A 10/26/2016   Procedure: ESOPHAGOGASTRODUODENOSCOPY (EGD);  Surgeon: Daneil Dolin, MD;  Location: [facility];  Service: Endoscopy;  Laterality: N/A;   FOOT SURGERY     FOOT SURGERY Right 2011   shave bone   TONSILLECTOMY      Family History  Problem Relation Age of Onset   Diabetes Mother    Stroke Mother  Cancer Father        liver and colon. Patient unsure primary    Stomach cancer Father    Diabetes Sister    Stomach cancer Sister    Liver cancer Sister    Bone cancer Sister    Diabetes Brother    Lung cancer Brother    Heart disease Son    Breast cancer Neg Hx     Social History   Socioeconomic History   Marital status: Legally Separated    Spouse name: Not on file   Number of children: 1   Years of education: Not on file   Highest education level: Not on file  Occupational History   Not on file  Tobacco Use   Smoking status:  Former    Packs/day: 0.25    Years: 3.00    Total pack years: 0.75    Types: Cigarettes    Quit date: 10/21/2016    Years since quitting: 5.9   Smokeless tobacco: Never  Vaping Use   Vaping Use: Never used  Substance and Sexual Activity   Alcohol use: No   Drug use: Not Currently    Types: Marijuana    Comment: started using marijuana a month on the weekends to cope with depression.   Sexual activity: Not Currently    Birth control/protection: Surgical  Other Topics Concern   Not on file  Social History Narrative   Had one child now deceased   Lives with her sister, sister recently diagnosed with cancer.    Social Determinants of Health   Financial Resource Strain: Unknown (07/07/2019)   Overall Financial Resource Strain (CARDIA)    Difficulty of Paying Living Expenses: Patient refused  Food Insecurity: Unknown (08/03/2022)   Hunger Vital Sign    Worried About Running Out of Food in the Last Year: Patient refused    [place] in the Last Year: Patient refused  Transportation Needs: No Transportation Needs (08/03/2022)   PRAPARE - Hydrologist (Medical): No    Lack of Transportation (Non-Medical): No  Physical Activity: Unknown (07/07/2019)   Exercise Vital Sign    Days of Exercise per Week: Patient refused    Minutes of Exercise per Session: Patient refused  Stress: Unknown (07/07/2019)   [place]    Feeling of Stress : Patient refused  Social Connections: Unknown (07/07/2019)   Social Connection and Isolation Panel [NHANES]    Frequency of Communication with Friends and Family: Patient refused    Frequency of Social Gatherings with Friends and Family: Patient refused    Attends Religious Services: Patient refused    Active Member of Clubs or Organizations: Patient refused    Attends Archivist Meetings: Patient refused    Marital Status: Patient refused  Intimate  Partner Violence: Not At Risk (08/03/2022)   Humiliation, Afraid, Rape, and Kick questionnaire    Fear of Current or Ex-Partner: No    Emotionally Abused: No    Physically Abused: No    Sexually Abused: No    Outpatient Medications Prior to Visit  Medication Sig Dispense Refill   acetaminophen (TYLENOL) 325 MG tablet Take 650 mg by mouth every 6 (six) hours as needed for moderate pain.     atorvastatin (LIPITOR) 10 MG tablet Take 1 tablet (10 mg total) by mouth daily. 90 tablet 3   diclofenac Sodium (VOLTAREN) 1 % GEL Apply 2 g topically daily as needed (pain).  gabapentin (NEURONTIN) 300 MG capsule TAKE 1 CAPSULE(300 MG) BY MOUTH THREE TIMES DAILY 90 capsule 0   Lancets (ONETOUCH ULTRASOFT) lancets Use as instructed 100 each 12   lisinopril (ZESTRIL) 20 MG tablet Take 1 tablet (20 mg total) by mouth daily. 90 tablet 3   ondansetron (ZOFRAN) 4 MG tablet Take 1 tablet (4 mg total) by mouth every 8 (eight) hours as needed for nausea or vomiting. 30 tablet 0   ONETOUCH VERIO test strip 2 (two) times daily.     UNABLE TO FIND Blood glucose monitor kit check blood glucose twice daily dx: e11.65 1 each 0   UNABLE TO FIND Glucose test strips check blood glucose twice daily dx e11.65 100 each 2   glyBURIDE-metformin (GLUCOVANCE) 5-500 MG tablet TAKE 1 TABLET BY MOUTH TWICE DAILY (Patient taking differently: Take 1 tablet by mouth 2 (two) times daily with a meal.) 60 tablet 2   amLODipine (NORVASC) 5 MG tablet Take 1 tablet (5 mg total) by mouth daily. 30 tablet 0   No facility-administered medications prior to visit.    Allergies  Allergen Reactions   Iohexol Swelling     Code: HIVES, Desc: PT STATES THE EYES SWOLL AND ITCHED, NEEDS PRE MEDS     ROS Review of Systems  Constitutional:  Negative for fatigue and fever.  Eyes:  Negative for visual disturbance.  Respiratory:  Negative for chest tightness and shortness of breath.   Cardiovascular:  Negative for chest pain and palpitations.   Endocrine: Negative for polydipsia, polyphagia and polyuria.  Musculoskeletal:  Positive for arthralgias.       Left foot pain  Neurological:  Positive for headaches. Negative for dizziness, light-headedness and numbness.      Objective:    Physical Exam HENT:     Head: Normocephalic.  Cardiovascular:     Rate and Rhythm: Normal rate and regular rhythm.     Heart sounds: No murmur heard.    No friction rub.  Pulmonary:     Effort: Pulmonary effort is normal.     Breath sounds: Normal breath sounds. No wheezing.  Abdominal:     Palpations: Abdomen is soft.  Musculoskeletal:     Cervical back: No rigidity.     Comments: She complains of pain with palpation of the left foot between the fourth and fifth toe +2DP and +2 PT No varicosities and no lower extremity edema present bilateral There is no pain with calf compression, swelling, warmth, erythema.     Neurological:     Mental Status: She is alert.     BP 124/86   Pulse 84   Ht _0  (1.702 m)   Wt 225 lb 0.6 oz (102.1 kg)   SpO2 99%   BMI 35.25 kg/m  Wt Readings from Last 3 Encounters:  10/11/22 225 lb 0.6 oz (102.1 kg)  08/11/22 217 lb (98.4 kg)  08/02/22 210 lb (95.3 kg)    Lab Results  Component Value Date   TSH 2.611 08/02/2022   Lab Results  Component Value Date   WBC 16.0 (H) 08/04/2022   HGB 12.1 08/04/2022   HCT 37.3 08/04/2022   MCV 68.3 (L) 08/04/2022   PLT 272 08/04/2022   Lab Results  Component Value Date   NA 134 (L) 08/04/2022   K 3.3 (L) 08/04/2022   CO2 25 08/04/2022   GLUCOSE 234 (H) 08/04/2022   BUN 13 08/04/2022   CREATININE 0.72 08/04/2022   BILITOT 0.9 08/04/2022   ALKPHOS 55  08/04/2022   AST 9 (L) 08/04/2022   ALT 12 08/04/2022   PROT 7.2 08/04/2022   ALBUMIN 3.3 (L) 08/04/2022   CALCIUM 8.7 (L) 08/04/2022   ANIONGAP 8 08/04/2022   EGFR 85 03/03/2022   Lab Results  Component Value Date   CHOL 122 06/07/2022   Lab Results  Component Value Date   HDL 32 (L)  06/07/2022   Lab Results  Component Value Date   LDLCALC 67 06/07/2022   Lab Results  Component Value Date   TRIG 131 06/07/2022   Lab Results  Component Value Date   CHOLHDL 3.8 06/07/2022   Lab Results  Component Value Date   HGBA1C 8.3 (H) 06/07/2022      Assessment & Plan:   Problem List Items Addressed This Visit       Cardiovascular and Mediastinum   Essential hypertension    Controlled Encouraged to continue taking amlodipine 5 mg and lisinopril 20 mg daily No changes to treatment regimen BP Readings from Last 3 Encounters:  10/11/22 124/86  08/11/22 132/78  08/04/22 (!) 169/74         Relevant Orders   CMP14+EGFR   CBC with Differential/Platelet     Endocrine   Type 2 diabetes mellitus with other specified complication ([place])    She reports having frequent hypoglycemic spells Will d/c glyburide-metformin 5-500 mg,  given the patient's frequent hypoglycemic episodes Encourage the patient to only take metformin 500 mg daily Will assess hemoglobin A1c today Lab Results  Component Value Date   HGBA1C 8.3 (H) 06/07/2022         Relevant Medications   metFORMIN (GLUCOPHAGE) 500 MG tablet   Type 2 diabetes mellitus ([place]) - Primary   Relevant Medications   metFORMIN (GLUCOPHAGE) 500 MG tablet   Other Relevant Orders   Hemoglobin A1c     Other   Hyperlipidemia LDL goal <70    Encouraged to continue taking atorvastatin 10 mg daily Will assess cholesterol level today Lab Results  Component Value Date   CHOL 122 06/07/2022   HDL 32 (L) 06/07/2022   LDLCALC 67 06/07/2022   TRIG 131 06/07/2022   CHOLHDL 3.8 06/07/2022         Relevant Orders   Lipid panel   Foot pain, left    Will order Motrin 800 mg every 8 hours as needed for pain Encourage patient to continue taking gabapentin 300 mg 3 times daily as prescribed Encouraged to continue to following up with her podiatrist as scheduled      Relevant Medications   ibuprofen (ADVIL) 800 MG  tablet   Other Visit Diagnoses     Vitamin D deficiency       Relevant Orders   VITAMIN D 25 Hydroxy (Vit-D Deficiency, Fractures)   Other specified hypothyroidism       Relevant Orders   TSH + free T4       Meds ordered this encounter  Medications   metFORMIN (GLUCOPHAGE) 500 MG tablet    Sig: Take 1 tablet (500 mg total) by mouth daily.    Dispense:  30 tablet    Refill:  3   ibuprofen (ADVIL) 800 MG tablet    Sig: Take 1 tablet (800 mg total) by mouth every 8 (eight) hours as needed.    Dispense:  30 tablet    Refill:  0    Follow-up: Return in about 3 months (around 01/11/2023).    Alvira Monday, FNP

## 2022-10-11 NOTE — Assessment & Plan Note (Signed)
Controlled Encouraged to continue taking amlodipine 5 mg and lisinopril 20 mg daily No changes to treatment regimen BP Readings from Last 3 Encounters:  10/11/22 124/86  08/11/22 132/78  08/04/22 (!) 169/74

## 2022-10-11 NOTE — Patient Instructions (Signed)
I appreciate the opportunity to provide care to you today!    Follow up:  3 months  Labs: please stop by the lab during the week to get your blood drawn (CBC, CMP, TSH, Lipid profile, HgA1c, Vit D)  Please pick up your medication at the pharmacy   Please continue to a heart-healthy diet and increase your physical activities. Try to exercise for 68mns at least three times a week.      It was a pleasure to see you and I look forward to continuing to work together on your health and well-being. Please do not hesitate to call the office if you need care or have questions about your care.   Have a wonderful day and week. With Gratitude, GAlvira MondayMSN, FNP-BC

## 2022-10-11 NOTE — Assessment & Plan Note (Signed)
Encouraged to continue taking atorvastatin 10 mg daily Will assess cholesterol level today Lab Results  Component Value Date   CHOL 122 06/07/2022   HDL 32 (L) 06/07/2022   LDLCALC 67 06/07/2022   TRIG 131 06/07/2022   CHOLHDL 3.8 06/07/2022

## 2022-10-11 NOTE — Assessment & Plan Note (Signed)
She reports having frequent hypoglycemic spells Will d/c glyburide-metformin 5-500 mg,  given the patient's frequent hypoglycemic episodes Encourage the patient to only take metformin 500 mg daily Will assess hemoglobin A1c today Lab Results  Component Value Date   HGBA1C 8.3 (H) 06/07/2022

## 2022-10-13 ENCOUNTER — Encounter: Payer: Self-pay | Admitting: Vascular Surgery

## 2022-10-13 ENCOUNTER — Ambulatory Visit (INDEPENDENT_AMBULATORY_CARE_PROVIDER_SITE_OTHER): Payer: Medicare Other | Admitting: Vascular Surgery

## 2022-10-13 VITALS — BP 151/76 | HR 84 | Temp 98.2°F | Resp 20 | Ht 67.0 in | Wt 226.0 lb

## 2022-10-13 DIAGNOSIS — I872 Venous insufficiency (chronic) (peripheral): Secondary | ICD-10-CM | POA: Diagnosis not present

## 2022-10-13 DIAGNOSIS — I748 Embolism and thrombosis of other arteries: Secondary | ICD-10-CM | POA: Diagnosis not present

## 2022-10-13 NOTE — Progress Notes (Signed)
ASSESSMENT & PLAN   POSSIBLE ATHEROEMBOLIC DISEASE: This patient had the sudden discoloration of the left fifth toe.  Certainly this could be secondary to atheroembolic disease.  Her left foot appears well-perfused and had biphasic signals on her Doppler study.  However on my exam is a monophasic dorsalis pedis signal.  I have recommended that we obtain a CT angio of the chest abdomen pelvis and runoff to look for a proximal source of embolization.  I will see her back after this study.  She just quit smoking a couple weeks ago and have encouraged her to stay off the cigarettes.  She is on a statin but was not on aspirin.  I have asked her to begin taking 81 mg of aspirin daily.  CHRONIC VENOUS INSUFFICIENCY: She does have CEAP C4 venous disease (hyperpigmentation).  She had a DVT back in January.  We have discussed the importance of leg elevation, compression therapy, avoiding prolonged sitting and standing, exercise, and the importance of maintaining a healthy weight.  REASON FOR CONSULT:    Peripheral arterial disease with claudication.  The consult is requested by Dr. Loel Lofty.  HPI:   Shawna Huerta is a 61 y.o. female who developed the sudden onset of discoloration of the left fifth toe several weeks ago.  This has persisted and the toes been somewhat painful.  Prior to this she denied any history of claudication, rest pain, or nonhealing ulcers.  Her risk factors for peripheral arterial disease include type 2 diabetes, hypertension, a family history of premature cardiovascular disease, and tobacco use.  She smoked about 5 cigarettes a day for 16 years but quit 2 weeks ago.  She had a DVT back in January and had been on anticoagulation.  Her most recent duplex results are discussed below.  She had some hyperpigmentation which she was concerned about.  She has some mild bilateral lower extremity swelling.   Past Medical History:  Diagnosis Date   Anemia    Arthritis    Bartholin cyst  07/03/2014   Colitis 08/2015   Treated at [facility]   Diabetes mellitus    Hypertension    Labial abscess 08/2015   Status post I&D by Dr. Ladona Horns   Nicotine addiction 09/03/2013   Obesity    Pancreatitis 09/2015   unknown etiology, no further work-up with gastroenterology    Vaginal itching 09/03/2013    Family History  Problem Relation Age of Onset   Diabetes Mother    Stroke Mother    Cancer Father        liver and colon. Patient unsure primary    Stomach cancer Father    Diabetes Sister    Stomach cancer Sister    Liver cancer Sister    Bone cancer Sister    Diabetes Brother    Lung cancer Brother    Heart disease Son    Breast cancer Neg Hx     SOCIAL HISTORY: Social History   Tobacco Use   Smoking status: Former    Packs/day: 0.25    Years: 3.00    Total pack years: 0.75    Types: Cigarettes    Quit date: 10/21/2016    Years since quitting: 5.9   Smokeless tobacco: Never  Substance Use Topics   Alcohol use: No    Allergies  Allergen Reactions   Iohexol Swelling     Code: HIVES, Desc: PT STATES THE EYES SWOLL AND ITCHED, NEEDS PRE MEDS     Current Outpatient  Medications  Medication Sig Dispense Refill   acetaminophen (TYLENOL) 325 MG tablet Take 650 mg by mouth every 6 (six) hours as needed for moderate pain.     atorvastatin (LIPITOR) 10 MG tablet Take 1 tablet (10 mg total) by mouth daily. 90 tablet 3   diclofenac Sodium (VOLTAREN) 1 % GEL Apply 2 g topically daily as needed (pain).     gabapentin (NEURONTIN) 300 MG capsule TAKE 1 CAPSULE(300 MG) BY MOUTH THREE TIMES DAILY 90 capsule 0   ibuprofen (ADVIL) 800 MG tablet Take 1 tablet (800 mg total) by mouth every 8 (eight) hours as needed. 30 tablet 0   Lancets (ONETOUCH ULTRASOFT) lancets Use as instructed 100 each 12   lisinopril (ZESTRIL) 20 MG tablet Take 1 tablet (20 mg total) by mouth daily. 90 tablet 3   metFORMIN (GLUCOPHAGE) 500 MG tablet Take 1 tablet (500 mg total) by mouth daily. 30  tablet 3   ondansetron (ZOFRAN) 4 MG tablet Take 1 tablet (4 mg total) by mouth every 8 (eight) hours as needed for nausea or vomiting. 30 tablet 0   ONETOUCH VERIO test strip 2 (two) times daily.     UNABLE TO FIND Blood glucose monitor kit check blood glucose twice daily dx: e11.65 1 each 0   UNABLE TO FIND Glucose test strips check blood glucose twice daily dx e11.65 100 each 2   amLODipine (NORVASC) 5 MG tablet Take 1 tablet (5 mg total) by mouth daily. 30 tablet 0   No current facility-administered medications for this visit.    REVIEW OF SYSTEMS:  _0  denotes positive finding, _1  denotes negative finding Cardiac  Comments:  Chest pain or chest pressure:    Shortness of breath upon exertion:    Short of breath when lying flat:    Irregular heart rhythm:        Vascular    Pain in calf, thigh, or hip brought on by ambulation:    Pain in feet at night that wakes you up from your sleep:     Blood clot in your veins:    Leg swelling:  x       Pulmonary    Oxygen at home:    Productive cough:     Wheezing:         Neurologic    Sudden weakness in arms or legs:     Sudden numbness in arms or legs:     Sudden onset of difficulty speaking or slurred speech:    Temporary loss of vision in one eye:     Problems with dizziness:         Gastrointestinal    Blood in stool:     Vomited blood:         Genitourinary    Burning when urinating:     Blood in urine:        Psychiatric    Major depression:         Hematologic    Bleeding problems:    Problems with blood clotting too easily:        Skin    Rashes or ulcers:        Constitutional    Fever or chills:    -  PHYSICAL EXAM:   Vitals:   10/13/22 0837  BP: (!) 151/76  Pulse: 84  Resp: 20  Temp: 98.2 F (36.8 C)  SpO2: 98%  Weight: 226 lb (102.5 kg)  Height: _2  (1.702 m)   Body  mass index is 35.4 kg/m. GENERAL: The patient is a well-nourished female, in no acute distress. The vital signs are  documented above. CARDIAC: There is a regular rate and rhythm.  VASCULAR: I do not detect carotid bruits. On the right side she has a palpable femoral pulse and dorsalis pedis pulse.  She has a biphasic dorsalis pedis and posterior tibial signal with the Doppler. On the left side she has a palpable femoral pulse.  I cannot palpate pedal pulses.  She has a biphasic posterior tibial signal with a monophasic dorsalis pedis signal. He has some slight discoloration of her left fifth toe. PULMONARY: There is good air exchange bilaterally without wheezing or rales. ABDOMEN: Soft and non-tender with normal pitched bowel sounds.  I do not palpate an aneurysm. MUSCULOSKELETAL: There are no major deformities. NEUROLOGIC: No focal weakness or paresthesias are detected. SKIN: There are no ulcers or rashes noted. PSYCHIATRIC: The patient has a normal affect.  DATA:    ARTERIAL DOPPLER STUDY: I have reviewed the arterial Doppler study that was done on 10/07/2022.  On the right side there is a biphasic posterior tibial signal with a triphasic dorsalis pedis signal.  ABI is 100%.  Toe pressures 151 mmHg.  On the left side there is a biphasic dorsalis pedis and posterior tibial signal.  ABIs 100%.  Toe pressures 87 mmHg.  VENOUS DUPLEX: I reviewed the venous duplex scan that was done on 12/15/2021.  This showed chronic clot in the left calf veins and partial recanalization of the DVT in the left popliteal vein.  There was interval resolution of the clot in the femoral vein.  Deitra Mayo [facility]

## 2022-10-14 LAB — CBC WITH DIFFERENTIAL/PLATELET
Basophils Absolute: 0 10*3/uL (ref 0.0–0.2)
Basos: 0 %
EOS (ABSOLUTE): 0.2 10*3/uL (ref 0.0–0.4)
Eos: 2 %
Hematocrit: 36.1 % (ref 34.0–46.6)
Hemoglobin: 10.4 g/dL — ABNORMAL LOW (ref 11.1–15.9)
Immature Grans (Abs): 0 10*3/uL (ref 0.0–0.1)
Immature Granulocytes: 0 %
Lymphocytes Absolute: 2.7 10*3/uL (ref 0.7–3.1)
Lymphs: 34 %
MCH: 21.4 pg — ABNORMAL LOW (ref 26.6–33.0)
MCHC: 28.8 g/dL — ABNORMAL LOW (ref 31.5–35.7)
MCV: 74 fL — ABNORMAL LOW (ref 79–97)
Monocytes Absolute: 0.4 10*3/uL (ref 0.1–0.9)
Monocytes: 5 %
Neutrophils Absolute: 4.6 10*3/uL (ref 1.4–7.0)
Neutrophils: 59 %
Platelets: 296 10*3/uL (ref 150–450)
RBC: 4.86 x10E6/uL (ref 3.77–5.28)
RDW: 17.1 % — ABNORMAL HIGH (ref 11.7–15.4)
WBC: 7.9 10*3/uL (ref 3.4–10.8)

## 2022-10-14 LAB — CMP14+EGFR
ALT: 9 IU/L (ref 0–32)
AST: 10 IU/L (ref 0–40)
Albumin/Globulin Ratio: 1.1 — ABNORMAL LOW (ref 1.2–2.2)
Albumin: 3.9 g/dL (ref 3.8–4.9)
Alkaline Phosphatase: 79 IU/L (ref 44–121)
BUN/Creatinine Ratio: 8 — ABNORMAL LOW (ref 9–23)
BUN: 7 mg/dL (ref 6–24)
Bilirubin Total: <0.2 mg/dL (ref 0.0–1.2)
CO2: 20 mmol/L (ref 20–29)
Calcium: 9.7 mg/dL (ref 8.7–10.2)
Chloride: 107 mmol/L — ABNORMAL HIGH (ref 96–106)
Creatinine, Ser: 0.89 mg/dL (ref 0.57–1.00)
Globulin, Total: 3.4 g/dL (ref 1.5–4.5)
Glucose: 85 mg/dL (ref 70–99)
Potassium: 5.2 mmol/L (ref 3.5–5.2)
Sodium: 140 mmol/L (ref 134–144)
Total Protein: 7.3 g/dL (ref 6.0–8.5)
eGFR: 75 mL/min/{1.73_m2} (ref >59)

## 2022-10-14 LAB — LIPID PANEL
Chol/HDL Ratio: 3 ratio (ref 0.0–4.4)
Cholesterol, Total: 128 mg/dL (ref 100–199)
HDL: 43 mg/dL (ref >39)
LDL Chol Calc (NIH): 62 mg/dL (ref 0–99)
Triglycerides: 128 mg/dL (ref 0–149)
VLDL Cholesterol Cal: 23 mg/dL (ref 5–40)

## 2022-10-14 LAB — VITAMIN D 25 HYDROXY (VIT D DEFICIENCY, FRACTURES): Vit D, 25-Hydroxy: 8 ng/mL — ABNORMAL LOW (ref 30.0–100.0)

## 2022-10-14 LAB — TSH+FREE T4
Free T4: 1.13 ng/dL (ref 0.82–1.77)
TSH: 1.28 u[IU]/mL (ref 0.450–4.500)

## 2022-10-14 LAB — HEMOGLOBIN A1C
Est. average glucose Bld gHb Est-mCnc: 186 mg/dL
Hgb A1c MFr Bld: 8.1 % — ABNORMAL HIGH (ref 4.8–5.6)

## 2022-10-17 ENCOUNTER — Emergency Department (HOSPITAL_COMMUNITY)
Admission: EM | Admit: 2022-10-17 | Discharge: 2022-10-17 | Disposition: A | Discharge status: Home / Self Care | Payer: Medicare Other | Attending: Emergency Medicine | Admitting: Emergency Medicine

## 2022-10-17 ENCOUNTER — Encounter (HOSPITAL_COMMUNITY): Payer: Self-pay | Admitting: Emergency Medicine

## 2022-10-17 ENCOUNTER — Encounter: Payer: Medicare Other | Admitting: Internal Medicine

## 2022-10-17 ENCOUNTER — Other Ambulatory Visit: Payer: Self-pay

## 2022-10-17 DIAGNOSIS — R112 Nausea with vomiting, unspecified: Secondary | ICD-10-CM | POA: Diagnosis present

## 2022-10-17 DIAGNOSIS — E860 Dehydration: Secondary | ICD-10-CM | POA: Diagnosis not present

## 2022-10-17 DIAGNOSIS — R944 Abnormal results of kidney function studies: Secondary | ICD-10-CM | POA: Insufficient documentation

## 2022-10-17 DIAGNOSIS — R10817 Generalized abdominal tenderness: Secondary | ICD-10-CM | POA: Insufficient documentation

## 2022-10-17 DIAGNOSIS — I10 Essential (primary) hypertension: Secondary | ICD-10-CM | POA: Diagnosis not present

## 2022-10-17 DIAGNOSIS — R000 Tachycardia, unspecified: Secondary | ICD-10-CM | POA: Insufficient documentation

## 2022-10-17 DIAGNOSIS — R197 Diarrhea, unspecified: Secondary | ICD-10-CM | POA: Diagnosis not present

## 2022-10-17 DIAGNOSIS — Z7984 Long term (current) use of oral hypoglycemic drugs: Secondary | ICD-10-CM | POA: Diagnosis not present

## 2022-10-17 DIAGNOSIS — D72829 Elevated white blood cell count, unspecified: Secondary | ICD-10-CM | POA: Diagnosis not present

## 2022-10-17 DIAGNOSIS — Z20822 Contact with and (suspected) exposure to covid-19: Secondary | ICD-10-CM | POA: Diagnosis not present

## 2022-10-17 LAB — COMPREHENSIVE METABOLIC PANEL
ALT: 14 U/L (ref 0–44)
AST: 18 U/L (ref 15–41)
Albumin: 4.1 g/dL (ref 3.5–5.0)
Alkaline Phosphatase: 82 U/L (ref 38–126)
Anion gap: 16 — ABNORMAL HIGH (ref 5–15)
BUN: 32 mg/dL — ABNORMAL HIGH (ref 6–20)
CO2: 23 mmol/L (ref 22–32)
Calcium: 9.1 mg/dL (ref 8.9–10.3)
Chloride: 88 mmol/L — ABNORMAL LOW (ref 98–111)
Creatinine, Ser: 1.64 mg/dL — ABNORMAL HIGH (ref 0.44–1.00)
GFR, Estimated: 36 mL/min — ABNORMAL LOW (ref >60)
Glucose, Bld: 384 mg/dL — ABNORMAL HIGH (ref 70–99)
Potassium: 3.7 mmol/L (ref 3.5–5.1)
Sodium: 127 mmol/L — ABNORMAL LOW (ref 135–145)
Total Bilirubin: 0.7 mg/dL (ref 0.3–1.2)
Total Protein: 9.1 g/dL — ABNORMAL HIGH (ref 6.5–8.1)

## 2022-10-17 LAB — URINALYSIS, ROUTINE W REFLEX MICROSCOPIC
Bacteria, UA: NONE SEEN
Glucose, UA: >=500 mg/dL — AB
Hgb urine dipstick: NEGATIVE
Ketones, ur: 5 mg/dL — AB
Leukocytes,Ua: NEGATIVE
Nitrite: NEGATIVE
Protein, ur: >=300 mg/dL — AB
Specific Gravity, Urine: 1.032 — ABNORMAL HIGH (ref 1.005–1.030)
pH: 5 (ref 5.0–8.0)

## 2022-10-17 LAB — CBC
HCT: 45.7 % (ref 36.0–46.0)
Hemoglobin: 14.8 g/dL (ref 12.0–15.0)
MCH: 21.9 pg — ABNORMAL LOW (ref 26.0–34.0)
MCHC: 32.4 g/dL (ref 30.0–36.0)
MCV: 67.7 fL — ABNORMAL LOW (ref 80.0–100.0)
Platelets: 336 10*3/uL (ref 150–400)
RBC: 6.75 MIL/uL — ABNORMAL HIGH (ref 3.87–5.11)
RDW: 18.1 % — ABNORMAL HIGH (ref 11.5–15.5)
WBC: 16.7 10*3/uL — ABNORMAL HIGH (ref 4.0–10.5)
nRBC: 0 % (ref 0.0–0.2)

## 2022-10-17 LAB — RESP PANEL BY RT-PCR (FLU A&B, COVID) ARPGX2
Influenza A by PCR: NEGATIVE
Influenza B by PCR: NEGATIVE
SARS Coronavirus 2 by RT PCR: NEGATIVE

## 2022-10-17 LAB — LIPASE, BLOOD: Lipase: 41 U/L (ref 11–51)

## 2022-10-17 MED ORDER — LACTATED RINGERS IV BOLUS
1000.0000 mL | Freq: Once | INTRAVENOUS | Status: AC
  Administered 2022-10-17: 1000 mL via INTRAVENOUS

## 2022-10-17 MED ORDER — ONDANSETRON HCL 4 MG/2ML IJ SOLN
4.0000 mg | Freq: Once | INTRAMUSCULAR | Status: AC
  Administered 2022-10-17: 4 mg via INTRAVENOUS
  Filled 2022-10-17: qty 2

## 2022-10-17 MED ORDER — LABETALOL HCL 5 MG/ML IV SOLN
20.0000 mg | Freq: Once | INTRAVENOUS | Status: AC
  Administered 2022-10-17: 20 mg via INTRAVENOUS
  Filled 2022-10-17: qty 4

## 2022-10-17 MED ORDER — METOCLOPRAMIDE HCL 5 MG/ML IJ SOLN
10.0000 mg | Freq: Once | INTRAMUSCULAR | Status: AC
  Administered 2022-10-17: 10 mg via INTRAVENOUS
  Filled 2022-10-17: qty 2

## 2022-10-17 MED ORDER — HYDROMORPHONE HCL 1 MG/ML IJ SOLN
1.0000 mg | Freq: Once | INTRAMUSCULAR | Status: AC
  Administered 2022-10-17: 1 mg via INTRAVENOUS
  Filled 2022-10-17: qty 1

## 2022-10-17 MED ORDER — ONDANSETRON 4 MG PO TBDP: ORAL_TABLET | ORAL | 0 refills | Status: DC

## 2022-10-17 NOTE — ED Provider Notes (Signed)
[facility]ERGENC[facility] DEPARTMENT Provider Note   CSN: [id number] Arrival date & time: 10/17/22  0045     History  No chief complaint on file.   Shawna Huerta is a 61 y.o. female.  Patient reports fever, chills, generalized weakness associated with nausea, vomiting and diarrhea.  Symptoms ongoing for 2 days.       Home Medications Prior to Admission medications   Medication Sig Start Date End Date Taking? Authorizing Provider  ondansetron (ZOFRAN-ODT) 4 MG disintegrating tablet 4mg ODT q4 hours prn nausea/vomit 10/17/22  Yes Pollina, Christopher J, MD  acetaminophen (TYLENOL) 325 MG tablet Take 650 mg by mouth every 6 (six) hours as needed for moderate pain.    [provider]  amLODipine (NORVASC) 5 MG tablet Take 1 tablet (5 mg total) by mouth daily. 06/20/22 08/02/22  Shahmehdi, Seyed A, MD  atorvastatin (LIPITOR) 10 MG tablet Take 1 tablet (10 mg total) by mouth daily. 02/18/22   Paseda, Folashade R, FNP  diclofenac Sodium (VOLTAREN) 1 % GEL Apply 2 g topically daily as needed (pain).    [provider]  gabapentin (NEURONTIN) 300 MG capsule TAKE 1 CAPSULE(300 MG) BY MOUTH THREE TIMES DAILY 09/23/22   Standiford, Alexander F, DPM  ibuprofen (ADVIL) 800 MG tablet Take 1 tablet (800 mg total) by mouth every 8 (eight) hours as needed. 10/11/22   Zarwolo, Gloria, FNP  Lancets (ONETOUCH ULTRASOFT) lancets Use as instructed 04/13/22   Paseda, Folashade R, FNP  lisinopril (ZESTRIL) 20 MG tablet Take 1 tablet (20 mg total) by mouth daily. 02/17/22   Paseda, Folashade R, FNP  metFORMIN (GLUCOPHAGE) 500 MG tablet Take 1 tablet (500 mg total) by mouth daily. 10/11/22   Zarwolo, Gloria, FNP  ondansetron (ZOFRAN) 4 MG tablet Take 1 tablet (4 mg total) by mouth every 8 (eight) hours as needed for nausea or vomiting. 08/04/22 08/04/23  Shah, Pratik D, DO  ONETOUCH VERIO test strip 2 (two) times daily. 04/12/22   [provider]  UNABLE TO FIND Blood glucose monitor kit check  blood glucose twice daily dx: e11.65 04/12/22   Paseda, Folashade R, FNP  UNABLE TO FIND Glucose test strips check blood glucose twice daily dx e11.65 04/12/22   Paseda, Folashade R, FNP      Allergies    Iohexol    Review of Systems   Review of Systems  Physical Exam Updated Vital Signs BP (!) 188/101   Pulse 89   Temp 97.9 F (36.6 C) (Oral)   Resp 11   Ht 5' 7" (1.702 m)   Wt 103 kg   SpO2 98%   BMI 35.56 kg/m  Physical Exam Vitals and nursing note reviewed.  Constitutional:      General: She is not in acute distress.    Appearance: She is well-developed.  HENT:     Head: Normocephalic and atraumatic.     Mouth/Throat:     Mouth: Mucous membranes are moist.  Eyes:     General: Vision grossly intact. Gaze aligned appropriately.     Extraocular Movements: Extraocular movements intact.     Conjunctiva/sclera: Conjunctivae normal.  Cardiovascular:     Rate and Rhythm: Regular rhythm. Tachycardia present.     Pulses: Normal pulses.     Heart sounds: Normal heart sounds, S1 normal and S2 normal. No murmur heard.    No friction rub. No gallop.  Pulmonary:     Effort: Pulmonary effort is normal. No respiratory distress.     Breath   sounds: Normal breath sounds.  Abdominal:     General: Bowel sounds are normal.     Palpations: Abdomen is soft.     Tenderness: There is generalized abdominal tenderness. There is no guarding or rebound.     Hernia: No hernia is present.  Musculoskeletal:        General: No swelling.     Cervical back: Full passive range of motion without pain, normal range of motion and neck supple. No spinous process tenderness or muscular tenderness. Normal range of motion.     Right lower leg: No edema.     Left lower leg: No edema.  Skin:    General: Skin is warm and dry.     Capillary Refill: Capillary refill takes less than 2 seconds.     Findings: No ecchymosis, erythema, rash or wound.  Neurological:     General: No focal deficit present.      Mental Status: She is alert and oriented to person, place, and time.     GCS: GCS eye subscore is 4. GCS verbal subscore is 5. GCS motor subscore is 6.     Cranial Nerves: Cranial nerves 2-12 are intact.     Sensory: Sensation is intact.     Motor: Motor function is intact.     Coordination: Coordination is intact.  Psychiatric:        Attention and Perception: Attention normal.        Mood and Affect: Mood normal.        Speech: Speech normal.        Behavior: Behavior normal.     ED Results / Procedures / Treatments   Labs (all labs ordered are listed, but only abnormal results are displayed) Labs Reviewed  COMPREHENSIVE METABOLIC PANEL - Abnormal; Notable for the following components:      Result Value   Sodium 127 (*)    Chloride 88 (*)    Glucose, Bld 384 (*)    BUN 32 (*)    Creatinine, Ser 1.64 (*)    Total Protein 9.1 (*)    GFR, Estimated 36 (*)    Anion gap 16 (*)    All other components within normal limits  CBC - Abnormal; Notable for the following components:   WBC 16.7 (*)    RBC 6.75 (*)    MCV 67.7 (*)    MCH 21.9 (*)    RDW 18.1 (*)    All other components within normal limits  URINALYSIS, ROUTINE W REFLEX MICROSCOPIC - Abnormal; Notable for the following components:   Color, Urine AMBER (*)    APPearance CLOUDY (*)    Specific Gravity, Urine 1.032 (*)    Glucose, UA >=500 (*)    Bilirubin Urine SMALL (*)    Ketones, ur 5 (*)    Protein, ur >=300 (*)    All other components within normal limits  RESP PANEL BY RT-PCR (FLU A&B, COVID) ARPGX2  LIPASE, BLOOD    EKG None  Radiology No results found.  Procedures Procedures    Medications Ordered in ED Medications  lactated ringers bolus 1,000 mL (0 mLs Intravenous Stopped 10/17/22 0223)  ondansetron (ZOFRAN) injection 4 mg (4 mg Intravenous Given 10/17/22 0108)  labetalol (NORMODYNE) injection 20 mg (20 mg Intravenous Given 10/17/22 0141)  lactated ringers bolus 1,000 mL (1,000 mLs  Intravenous New Bag/Given 10/17/22 0407)  metoCLOPramide (REGLAN) injection 10 mg (10 mg Intravenous Given 10/17/22 0406)  HYDROmorphone (DILAUDID) injection 1 mg (1 mg Intravenous  Given 10/17/22 0406)    ED Course/ Medical Decision Making/ A&P                           Medical Decision Making Amount and/or Complexity of Data Reviewed External Data Reviewed: labs, radiology, ECG and notes. Labs: ordered. Decision-making details documented in ED Course. ECG/medicine tests: ordered and independent interpretation performed. Decision-making details documented in ED Course.  Risk Prescription drug management.   Patient presents to the emergency department for evaluation of subjective fevers, chills, generalized weakness with nausea, vomiting and diarrhea.  Symptoms ongoing for 2 days.  Reviewing her records reveals that she has had multiple ED visits with similar complaints.  She had a similar visit a couple of months ago and had a CT at that time that was normal.  Abdominal exam is nonfocal, no guarding, rebound or signs of an acute surgical process.  Lab work reveals leukocytosis, nonspecific.  No signs of focal infection.  She does have mild increase in her BUN and creatinine consistent with dehydration.  Patient administered IV fluids with antiemetics and analgesia.  Her symptoms have improved.  Patient hypertensive at arrival.  She was treated with IV labetalol, will need to take her regularly scheduled antihypertensives this morning.  No chest pain, shortness of breath, no headache or blurred vision.  Normal neurologic exam.  At this time patient feels better and is appropriate for discharge, outpatient management by primary care.        Final Clinical Impression(s) / ED Diagnoses Final diagnoses:  Nausea, vomiting and diarrhea  Primary hypertension  Dehydration    Rx / DC Orders ED Discharge Orders          Ordered    ondansetron (ZOFRAN-ODT) 4 MG disintegrating tablet         10/17/22 0421              Pollina, Christopher J, MD 10/17/22 0421  

## 2022-10-17 NOTE — ED Triage Notes (Signed)
Pt with c/o fever, chills, nausea and vomiting since Friday.

## 2022-10-18 ENCOUNTER — Telehealth: Payer: Self-pay

## 2022-10-18 NOTE — Telephone Encounter (Signed)
Transition Care Management Follow-up Telephone Call Date of discharge and from where: [facility] 10/17/2022 How have you been since you were released from the hospital? Still has nausea vomiting and diarrhea Any questions or concerns? No  Items Reviewed: Did the pt receive and understand the discharge instructions provided? Yes  Medications obtained and verified? Yes  Other? No  Any new allergies since your discharge? No  Dietary orders reviewed? Yes Do you have support at home? Yes   Home Care and Equipment/Supplies: Were home health services ordered? no If so, what is the name of the agency? N/a  Has the agency set up a time to come to the patient's home? not applicable Were any new equipment or medical supplies ordered?  No What is the name of the medical supply agency? N/a Were you able to get the supplies/equipment? not applicable Do you have any questions related to the use of the equipment or supplies? No  Functional Questionnaire: (I = Independent and D = Dependent) ADLs: I  Bathing/Dressing- I  Meal Prep- I  Eating- I  Maintaining continence- I  Transferring/Ambulation- I  Managing Meds- I  Follow up appointments reviewed:  PCP Hospital f/u appt confirmed? No  NO AVAILABLE APPT TIMES. Will send message to staff to schedule Specialist Hospital f/u appt confirmed? No   Are transportation arrangements needed? No  If their condition worsens, is the pt aware to call PCP or go to the Emergency Dept.? Yes Was the patient provided with contact information for the PCP's office or ED? Yes Was to pt encouraged to call back with questions or concerns? Yes Juanda Crumble, LPN [facility] [phone]

## 2022-10-19 ENCOUNTER — Other Ambulatory Visit: Payer: Self-pay | Admitting: Family Medicine

## 2022-10-19 ENCOUNTER — Ambulatory Visit (INDEPENDENT_AMBULATORY_CARE_PROVIDER_SITE_OTHER): Payer: Medicare Other | Admitting: Family Medicine

## 2022-10-19 ENCOUNTER — Encounter: Payer: Self-pay | Admitting: Family Medicine

## 2022-10-19 VITALS — BP 128/82 | HR 114 | Ht 67.0 in | Wt 200.1 lb

## 2022-10-19 DIAGNOSIS — E1169 Type 2 diabetes mellitus with other specified complication: Secondary | ICD-10-CM

## 2022-10-19 DIAGNOSIS — N1832 Chronic kidney disease, stage 3b: Secondary | ICD-10-CM

## 2022-10-19 DIAGNOSIS — E559 Vitamin D deficiency, unspecified: Secondary | ICD-10-CM

## 2022-10-19 DIAGNOSIS — K3184 Gastroparesis: Secondary | ICD-10-CM

## 2022-10-19 DIAGNOSIS — E119 Type 2 diabetes mellitus without complications: Secondary | ICD-10-CM

## 2022-10-19 MED ORDER — METOCLOPRAMIDE HCL 5 MG PO TABS: 5.0000 mg | ORAL_TABLET | Freq: Four times a day (QID) | ORAL | 2 refills | Status: DC

## 2022-10-19 MED ORDER — METFORMIN HCL 1000 MG PO TABS: 1000.0000 mg | ORAL_TABLET | Freq: Two times a day (BID) | ORAL | 3 refills | Status: DC

## 2022-10-19 MED ORDER — VITAMIN D (ERGOCALCIFEROL) 1.25 MG (50000 UNIT) PO CAPS: 50000.0000 [IU] | ORAL_CAPSULE | ORAL | 2 refills | Status: DC

## 2022-10-19 MED ORDER — BLOOD GLUCOSE METER KIT: PACK | 0 refills | Status: DC

## 2022-10-19 MED ORDER — DAPAGLIFLOZIN PROPANEDIOL 5 MG PO TABS: 5.0000 mg | ORAL_TABLET | Freq: Every day | ORAL | 1 refills | Status: DC

## 2022-10-19 NOTE — Assessment & Plan Note (Signed)
Likely due to hyperglycemia Hyperglycemia (blood glucose >200 mg/dL) may contribute to delayed gastric emptying Glucose kit and supplies ordered Encourage patient to check her blood sugar 3 times a day Reviewed blood sugar goals  Fasting blood sugars: 80-130 Before lunch and dinner: less than 140 2 hours after meals: less than 180 Discontinued Zofran and started patient on Reglan 5 mg 4 times daily as needed for nausea and vomiting Encouraged to stay hydrated, back on some LE 64 ounces of fluids daily 

## 2022-10-19 NOTE — Patient Instructions (Addendum)
I appreciate the opportunity to provide care to you today!    Follow up:  01/11/2023  Gastroparesis is a condition in which food takes longer than normal to empty from the stomach Please pick up your medication at the pharmacy and start taking   Please check your blood sugar 3 times daily Goal blood sugars:  Fasting blood sugars: 80-130 Before lunch and dinner: less than 140 2 hours after meals: less than 180  Please continue to a heart-healthy diet and increase your physical activities. Try to exercise for 28mns at least three times a week.      It was a pleasure to see you and I look forward to continuing to work together on your health and well-being. Please do not hesitate to call the office if you need care or have questions about your care.   Have a wonderful day and week. With Gratitude, GAlvira MondayMSN, FNP-BC

## 2022-10-19 NOTE — Progress Notes (Signed)
Established Patient Office Visit  Subjective:  Patient ID: Shawna Huerta, female    DOB: 01-24-1961  Age: 61 y.o. MRN: [id number]  CC:  Chief Complaint  Patient presents with   Follow-up    Pt reports diarrhea and vomiting since 10/14/22, over the weekend it was intolerable and Sunday 10/18/2022 she went in to the ED, has been unable to keep anything down, everything she eat she later throws it back up.     HPI Shawna Huerta is a 61 y.o. female with past medical history of type 2 diabetes presents for f/u of  chronic medical conditions.  Gastroparesis: Her hemoglobin A1c on 10/14/2019 is 8.1, with a glucose of 384 on 10/17/2022.  She admits to drinking Kool-Aid daily and checking her blood sugar 2-3 times weekly.  She reports polyuria, polyphagia, polydipsia.  She complains of nausea, vomiting, and abdominal pain.  She denies fever, nasal congestion, and sinus headaches.  She reports taking pepto bismol with minimal relief of her symptoms.  She was seen in the ED on 09/16/2022 with similar complaints of nausea, vomiting, and diarrhea; she was treated with Zofran.  She reports minimal relief of her symptoms with Zofran.     Past Medical History:  Diagnosis Date   Anemia    Arthritis    Bartholin cyst 07/03/2014   Colitis 08/2015   Treated at [facility]   Diabetes mellitus    Hypertension    Labial abscess 08/2015   Status post I&D by Dr. Ladona Horns   Nicotine addiction 09/03/2013   Obesity    Pancreatitis 09/2015   unknown etiology, no further work-up with gastroenterology    Vaginal itching 09/03/2013    Past Surgical History:  Procedure Laterality Date   ABDOMINAL HYSTERECTOMY     ABDOMINAL SURGERY     exploratry with TAH   BALLOON DILATION N/A 10/26/2016   Procedure: Pyloric channel BALLOON DILATION;  Surgeon: Daneil Dolin, MD;  Location: [facility];  Service: Endoscopy;  Laterality: N/A;   BARTHOLIN GLAND CYST EXCISION Right 08/20/2014   Procedure: EXCISION OF RIGHT  BARTHOLIN'S GLAND CYST;  Surgeon: Florian Buff, MD;  Location: [facility] ORS;  Service: Gynecology;  Laterality: Right;   CHOLECYSTECTOMY     COLONOSCOPY  10/2015   Doristine Mango: mild diverticulosis, at least 8 polpys removed, multiple tubular adenomas. next tcs in 1 year   ESOPHAGOGASTRODUODENOSCOPY N/A 10/26/2016   Procedure: ESOPHAGOGASTRODUODENOSCOPY (EGD);  Surgeon: Daneil Dolin, MD;  Location: [facility];  Service: Endoscopy;  Laterality: N/A;   FOOT SURGERY     FOOT SURGERY Right 2011   shave bone   TONSILLECTOMY      Family History  Problem Relation Age of Onset   Diabetes Mother    Stroke Mother    Cancer Father        liver and colon. Patient unsure primary    Stomach cancer Father    Diabetes Sister    Stomach cancer Sister    Liver cancer Sister    Bone cancer Sister    Diabetes Brother    Lung cancer Brother    Heart disease Son    Breast cancer Neg Hx     Social History   Socioeconomic History   Marital status: Legally Separated    Spouse name: Not on file   Number of children: 1   Years of education: Not on file   Highest education level: Not on file  Occupational History   Not  on file  Tobacco Use   Smoking status: Former    Packs/day: 0.25    Years: 3.00    Total pack years: 0.75    Types: Cigarettes    Quit date: 10/21/2016    Years since quitting: 5.9   Smokeless tobacco: Never  Vaping Use   Vaping Use: Never used  Substance and Sexual Activity   Alcohol use: No   Drug use: Not Currently    Types: Marijuana    Comment: started using marijuana a month on the weekends to cope with depression.   Sexual activity: Not Currently    Birth control/protection: Surgical  Other Topics Concern   Not on file  Social History Narrative   Had one child now deceased   Lives with her sister, sister recently diagnosed with cancer.    Social Determinants of Health   Financial Resource Strain: Unknown (07/07/2019)   Overall Financial Resource Strain  (CARDIA)    Difficulty of Paying Living Expenses: Patient refused  Food Insecurity: Unknown (08/03/2022)   Hunger Vital Sign    Worried About Running Out of Food in the Last Year: Patient refused    [place] in the Last Year: Patient refused  Transportation Needs: No Transportation Needs (08/03/2022)   PRAPARE - Hydrologist (Medical): No    Lack of Transportation (Non-Medical): No  Physical Activity: Unknown (07/07/2019)   Exercise Vital Sign    Days of Exercise per Week: Patient refused    Minutes of Exercise per Session: Patient refused  Stress: Unknown (07/07/2019)   Clara    Feeling of Stress : Patient refused  Social Connections: Unknown (07/07/2019)   Social Connection and Isolation Panel [NHANES]    Frequency of Communication with Friends and Family: Patient refused    Frequency of Social Gatherings with Friends and Family: Patient refused    Attends Religious Services: Patient refused    Active Member of Clubs or Organizations: Patient refused    Attends Archivist Meetings: Patient refused    Marital Status: Patient refused  Intimate Partner Violence: Not At Risk (08/03/2022)   Humiliation, Afraid, Rape, and Kick questionnaire    Fear of Current or Ex-Partner: No    Emotionally Abused: No    Physically Abused: No    Sexually Abused: No    Outpatient Medications Prior to Visit  Medication Sig Dispense Refill   acetaminophen (TYLENOL) 325 MG tablet Take 650 mg by mouth every 6 (six) hours as needed for moderate pain.     atorvastatin (LIPITOR) 10 MG tablet Take 1 tablet (10 mg total) by mouth daily. 90 tablet 3   dapagliflozin propanediol (FARXIGA) 5 MG TABS tablet Take 1 tablet (5 mg total) by mouth daily before breakfast. 30 tablet 1   diclofenac Sodium (VOLTAREN) 1 % GEL Apply 2 g topically daily as needed (pain).     gabapentin (NEURONTIN) 300 MG capsule TAKE 1  CAPSULE(300 MG) BY MOUTH THREE TIMES DAILY 90 capsule 0   ibuprofen (ADVIL) 800 MG tablet Take 1 tablet (800 mg total) by mouth every 8 (eight) hours as needed. 30 tablet 0   Lancets (ONETOUCH ULTRASOFT) lancets Use as instructed 100 each 12   lisinopril (ZESTRIL) 20 MG tablet Take 1 tablet (20 mg total) by mouth daily. 90 tablet 3   metFORMIN (GLUCOPHAGE) 1000 MG tablet Take 1 tablet (1,000 mg total) by mouth 2 (two) times daily with a  meal. 180 tablet 3   ONETOUCH VERIO test strip 2 (two) times daily.     UNABLE TO FIND Blood glucose monitor kit check blood glucose twice daily dx: e11.65 1 each 0   UNABLE TO FIND Glucose test strips check blood glucose twice daily dx e11.65 100 each 2   Vitamin D, Ergocalciferol, (DRISDOL) 1.25 MG (50000 UNIT) CAPS capsule Take 1 capsule (50,000 Units total) by mouth every 7 (seven) days. 5 capsule 2   ondansetron (ZOFRAN) 4 MG tablet Take 1 tablet (4 mg total) by mouth every 8 (eight) hours as needed for nausea or vomiting. 30 tablet 0   ondansetron (ZOFRAN-ODT) 4 MG disintegrating tablet 59m ODT q4 hours prn nausea/vomit 10 tablet 0   amLODipine (NORVASC) 5 MG tablet Take 1 tablet (5 mg total) by mouth daily. 30 tablet 0   No facility-administered medications prior to visit.    Allergies  Allergen Reactions   Iohexol Swelling     Code: HIVES, Desc: PT STATES THE EYES SWOLL AND ITCHED, NEEDS PRE MEDS     ROS Review of Systems  Eyes:  Negative for visual disturbance.  Cardiovascular:  Negative for chest pain and palpitations.  Gastrointestinal:  Negative for diarrhea, nausea and vomiting.  Endocrine: Positive for polydipsia, polyphagia and polyuria.      Objective:    Physical Exam HENT:     Head: Normocephalic.  Cardiovascular:     Rate and Rhythm: Normal rate and regular rhythm.     Pulses: Normal pulses.     Heart sounds: Normal heart sounds.  Pulmonary:     Effort: Pulmonary effort is normal.     Breath sounds: Normal breath sounds.  No wheezing.  Musculoskeletal:     Cervical back: No rigidity.  Neurological:     Mental Status: She is alert.     BP 128/82   Pulse (!) 114   Ht 5' 7" (1.702 m)   Wt 200 lb 1.3 oz (90.8 kg)   SpO2 97%   BMI 31.34 kg/m  Wt Readings from Last 3 Encounters:  10/19/22 200 lb 1.3 oz (90.8 kg)  10/17/22 227 lb 1.2 oz (103 kg)  10/13/22 226 lb (102.5 kg)    Lab Results  Component Value Date   TSH 1.280 10/13/2022   Lab Results  Component Value Date   WBC 16.7 (H) 10/17/2022   HGB 14.8 10/17/2022   HCT 45.7 10/17/2022   MCV 67.7 (L) 10/17/2022   PLT 336 10/17/2022   Lab Results  Component Value Date   NA 127 (L) 10/17/2022   K 3.7 10/17/2022   CO2 23 10/17/2022   GLUCOSE 384 (H) 10/17/2022   BUN 32 (H) 10/17/2022   CREATININE 1.64 (H) 10/17/2022   BILITOT 0.7 10/17/2022   ALKPHOS 82 10/17/2022   AST 18 10/17/2022   ALT 14 10/17/2022   PROT 9.1 (H) 10/17/2022   ALBUMIN 4.1 10/17/2022   CALCIUM 9.1 10/17/2022   ANIONGAP 16 (H) 10/17/2022   EGFR 75 10/13/2022   Lab Results  Component Value Date   CHOL 128 10/13/2022   Lab Results  Component Value Date   HDL 43 10/13/2022   Lab Results  Component Value Date   LDLCALC 62 10/13/2022   Lab Results  Component Value Date   TRIG 128 10/13/2022   Lab Results  Component Value Date   CHOLHDL 3.0 10/13/2022   Lab Results  Component Value Date   HGBA1C 8.1 (H) 10/13/2022  Assessment & Plan:  Gastroparesis Assessment & Plan: Likely due to hyperglycemia Hyperglycemia (blood glucose >200 mg/dL) may contribute to delayed gastric emptying Glucose kit and supplies ordered Encourage patient to check her blood sugar 3 times a day Reviewed blood sugar goals  Fasting blood sugars: 80-130 Before lunch and dinner: less than 140 2 hours after meals: less than 180 Discontinued Zofran and started patient on Reglan 5 mg 4 times daily as needed for nausea and vomiting Encouraged to stay hydrated, back on some LE  64 ounces of fluids daily  Orders: -     Metoclopramide HCl; Take 1 tablet (5 mg total) by mouth 4 (four) times daily.  Dispense: 30 tablet; Refill: 2  Type 2 diabetes mellitus with other specified complication, without long-term current use of insulin (HCC) -     Microalbumin / creatinine urine ratio -     blood glucose meter kit and supplies; Dispense based on patient and insurance preference. Use up to four times daily as directed. (FOR ICD-10 E10.9, E11.9).  Dispense: 1 each; Refill: 0    Follow-up: Return in about 12 weeks (around 01/11/2023).   Alvira Monday, FNP

## 2022-10-24 ENCOUNTER — Encounter: Payer: Self-pay | Admitting: Internal Medicine

## 2022-10-24 ENCOUNTER — Other Ambulatory Visit: Payer: Self-pay

## 2022-10-24 ENCOUNTER — Ambulatory Visit (INDEPENDENT_AMBULATORY_CARE_PROVIDER_SITE_OTHER): Payer: Medicare Other | Admitting: Internal Medicine

## 2022-10-24 ENCOUNTER — Telehealth: Payer: Self-pay | Admitting: Family Medicine

## 2022-10-24 VITALS — BP 111/74 | HR 101 | Resp 16 | Ht 67.0 in | Wt 206.0 lb

## 2022-10-24 DIAGNOSIS — Z0000 Encounter for general adult medical examination without abnormal findings: Secondary | ICD-10-CM

## 2022-10-24 MED ORDER — ONETOUCH VERIO VI STRP: ORAL_STRIP | 5 refills | Status: DC

## 2022-10-24 NOTE — Patient Instructions (Addendum)
  Shawna Huerta , Thank you for taking time to come for your Medicare Wellness Visit. I appreciate your ongoing commitment to your health goals. Please review the following plan we discussed and let me know if I can assist you in the future.   These are the goals we discussed:   Hold your blood pressure medications ( amlodipine and lisinopril). Follow up with [facility] on 12/05/22 . Resume if BP > 130/80.    This is a list of the screening recommended for you and due dates:  Health Maintenance  Topic Date Due   Zoster (Shingles) Vaccine (1 of 2) Never done   Eye exam for diabetics  02/23/2023   Yearly kidney health urinalysis for diabetes  04/08/2023   Complete foot exam   04/08/2023   Hemoglobin A1C  04/13/2023   Yearly kidney function blood test for diabetes  10/18/2023   Medicare Annual Wellness Visit  10/25/2023   Mammogram  11/18/2023   Colon Cancer Screening  11/09/2025   Flu Shot  Completed   Hepatitis C Screening: USPSTF Recommendation to screen - Ages 18-79 yo.  Completed   HIV Screening  Completed   HPV Vaccine  Aged Out   Pap Smear  Discontinued   COVID-19 Vaccine  Discontinued

## 2022-10-24 NOTE — Telephone Encounter (Signed)
Refill sent.

## 2022-10-24 NOTE — Telephone Encounter (Signed)
Pt stopped by front desk in regard to   blood glucose meter kit and supplies   Patient states that she does not have any test strips for meter.

## 2022-10-24 NOTE — Progress Notes (Signed)
Subjective:    Shawna Huerta is a 61 y.o. female who presents for a Welcome to Medicare exam.   Patient pulled some skin off of toe on left foot last night.  Recently was diagnosed with gastroparesis and doing much better since finding out this diagnosis. She reports her BP has been 80 to 90 systolic at home. Her BP today is 111/74 and she has not taken any medications at home.   Review of Systems Review of Systems  All other systems reviewed and are negative.   Cardiac Risk Factors include: smoking/ tobacco exposure;sedentary lifestyle;diabetes mellitus      Objective:    Today's Vitals   10/24/22 0930 10/24/22 0933  BP: 111/74   Pulse: (!) 101   Resp: 16   SpO2: 96%   Weight: 206 lb (93.4 kg)   Height: _0  (1.702 m)   PainSc:  5   Body mass index is 32.26 kg/m.  Medications Outpatient Encounter Medications as of 10/24/2022  Medication Sig   acetaminophen (TYLENOL) 325 MG tablet Take 650 mg by mouth every 6 (six) hours as needed for moderate pain.   amLODipine (NORVASC) 5 MG tablet Take 1 tablet (5 mg total) by mouth daily.   atorvastatin (LIPITOR) 10 MG tablet Take 1 tablet (10 mg total) by mouth daily.   blood glucose meter kit and supplies Dispense based on patient and insurance preference. Use up to four times daily as directed. (FOR ICD-10 E10.9, E11.9).   dapagliflozin propanediol (FARXIGA) 5 MG TABS tablet Take 1 tablet (5 mg total) by mouth daily before breakfast.   diclofenac Sodium (VOLTAREN) 1 % GEL Apply 2 g topically daily as needed (pain).   gabapentin (NEURONTIN) 300 MG capsule TAKE 1 CAPSULE(300 MG) BY MOUTH THREE TIMES DAILY   ibuprofen (ADVIL) 800 MG tablet Take 1 tablet (800 mg total) by mouth every 8 (eight) hours as needed.   Lancets (ONETOUCH ULTRASOFT) lancets Use as instructed   lisinopril (ZESTRIL) 20 MG tablet Take 1 tablet (20 mg total) by mouth daily.   metFORMIN (GLUCOPHAGE) 1000 MG tablet Take 1 tablet (1,000 mg total) by mouth 2 (two) times  daily with a meal.   metoCLOPramide (REGLAN) 5 MG tablet Take 1 tablet (5 mg total) by mouth 4 (four) times daily.   ONETOUCH VERIO test strip 2 (two) times daily.   UNABLE TO FIND Blood glucose monitor kit check blood glucose twice daily dx: e11.65   UNABLE TO FIND Glucose test strips check blood glucose twice daily dx e11.65   Vitamin D, Ergocalciferol, (DRISDOL) 1.25 MG (50000 UNIT) CAPS capsule Take 1 capsule (50,000 Units total) by mouth every 7 (seven) days.   No facility-administered encounter medications on file as of 10/24/2022.     History: Past Medical History:  Diagnosis Date   Allergy 2006   Contrast dye   Anemia    Arthritis    Bartholin cyst 07/03/2014   Colitis 08/2015   Treated at [facility]   Diabetes mellitus    Hypertension    Labial abscess 08/2015   Status post I&D by Dr. Ladona Horns   Nicotine addiction 09/03/2013   Obesity    Pancreatitis 09/2015   unknown etiology, no further work-up with gastroenterology    Tuberculosis 1970   Vaginal itching 09/03/2013   Past Surgical History:  Procedure Laterality Date   ABDOMINAL HYSTERECTOMY     ABDOMINAL SURGERY     exploratry with TAH   BALLOON DILATION N/A 10/26/2016  Procedure: Pyloric channel BALLOON DILATION;  Surgeon: Daneil Dolin, MD;  Location: [facility];  Service: Endoscopy;  Laterality: N/A;   BARTHOLIN GLAND CYST EXCISION Right 08/20/2014   Procedure: EXCISION OF RIGHT BARTHOLIN'S GLAND CYST;  Surgeon: Florian Buff, MD;  Location: [facility] ORS;  Service: Gynecology;  Laterality: Right;   CHOLECYSTECTOMY     COLONOSCOPY  10/2015   Doristine Mango: mild diverticulosis, at least 8 polpys removed, multiple tubular adenomas. next tcs in 1 year   ESOPHAGOGASTRODUODENOSCOPY N/A 10/26/2016   Procedure: ESOPHAGOGASTRODUODENOSCOPY (EGD);  Surgeon: Daneil Dolin, MD;  Location: [facility];  Service: Endoscopy;  Laterality: N/A;   FOOT SURGERY     FOOT SURGERY Right 2011   shave bone    TONSILLECTOMY     TUBAL LIGATION  1990    Family History  Problem Relation Age of Onset   Diabetes Mother    Stroke Mother    Cancer Father        liver and colon. Patient unsure primary    Stomach cancer Father    Diabetes Sister    Stomach cancer Sister    Liver cancer Sister    Bone cancer Sister    Diabetes Brother    Lung cancer Brother    Cancer Brother    Heart disease Son    Early death Son    Breast cancer Neg Hx    Social History   Occupational History   Not on file  Tobacco Use   Smoking status: Former    Packs/day: 0.25    Years: 3.00    Total pack years: 0.75    Types: Cigarettes    Quit date: 10/21/2016    Years since quitting: 6.0   Smokeless tobacco: Never  Vaping Use   Vaping Use: Never used  Substance and Sexual Activity   Alcohol use: No   Drug use: Not Currently    Types: Marijuana    Comment: started using marijuana a month on the weekends to cope with depression.   Sexual activity: Not Currently    Birth control/protection: Surgical    Tobacco Counseling Counseling given: Not Answered   Immunizations and Health Maintenance Immunization History  Administered Date(s) Administered   Influenza,inj,Quad PF,6+ Mos 02/17/2022, 08/11/2022   Moderna SARS-COV2 Booster Vaccination 10/29/2020   Moderna Sars-Covid-2 Vaccination 01/30/2020, 03/03/2020, 10/01/2021   Health Maintenance Due  Topic Date Due   Zoster Vaccines- Shingrix (1 of 2) Never done    Activities of Daily Living    10/24/2022    9:37 AM 08/03/2022    3:51 AM  In your present state of health, do you have any difficulty performing the following activities:  Hearing? 0 0  Vision? 0 0  Difficulty concentrating or making decisions? 0 0  Walking or climbing stairs? 0 0  Dressing or bathing? 0 0  Doing errands, shopping? 0 0  Preparing Food and eating ? N   Using the Toilet? N   In the past six months, have you accidently leaked urine? N   Do you have problems with loss of  bowel control? N   Managing your Medications? N   Managing your Finances? N   Housekeeping or managing your Housekeeping? N     Physical Exam   Physical Exam Constitutional:      General: She is not in acute distress.    Appearance: She is not ill-appearing.  Cardiovascular:     Rate and Rhythm: Normal rate and regular rhythm.  Heart sounds: Murmur heard.     Crescendo decrescendo systolic murmur is present with a grade of 2/6.  Pulmonary:     Effort: Pulmonary effort is normal.     Breath sounds: Normal breath sounds.  Musculoskeletal:     Right lower leg: No edema.     Left lower leg: No edema.  Feet:     Comments: Skin tear on left foot, 4 toe Psychiatric:        Mood and Affect: Mood normal.        Behavior: Behavior normal.      Advanced Directives: Does Patient Have a Medical Advance Directive?: No Would patient like information on creating a medical advance directive?: Yes (ED - Information included in AVS)     Vision/Hearing screen No results found.  Dietary issues and exercise activities discussed:  Current Exercise Habits: The patient does not participate in regular exercise at present   Goals   None   Depression Screen    10/24/2022    9:36 AM 10/19/2022    1:11 PM 10/11/2022    9:14 AM 08/11/2022    9:09 AM  PHQ 2/9 Scores  PHQ - 2 Score 0 0 1 0  PHQ- 9 Score  0 2      Fall Risk    10/24/2022    9:37 AM  Fall Risk   Falls in the past year? 0  Number falls in past yr: 0  Injury with Fall? 0    Cognitive Function:        Patient Care Team: Alvira Monday, FNP as PCP - General (Family Medicine)    Assessment/Plan:    This is a routine wellness examination for this patient . She has skin tear on left foot and will monitor daily. She will keep clean and dry. If any signs of infection she will contact our office. New systolic murmur heard on exam. Patient is asymptomatic. No further workup at this time. She obtained her first  shingles vaccine but did not realize it was a two part vaccine. She will obtain second part of vaccine. Otherwise she is up to date on vaccines and cancer screenings.   I have personally reviewed and noted the following in the patient's chart:   Medical and social history Use of alcohol, tobacco or illicit drugs  Current medications and supplements Functional ability and status Nutritional status Physical activity Advanced directives List of other physicians Hospitalizations, surgeries, and ER visits in previous 12 months Vitals Screenings to include cognitive, depression, and falls Referrals and appointments  In addition, I have reviewed and discussed with patient certain preventive protocols, quality metrics, and best practice recommendations. A written personalized care plan for preventive services as well as general preventive health recommendations were provided to patient.     Lorene Dy, MD 10/24/2022

## 2022-10-25 ENCOUNTER — Other Ambulatory Visit: Payer: Self-pay | Admitting: Podiatry

## 2022-11-14 ENCOUNTER — Other Ambulatory Visit: Payer: Self-pay

## 2022-11-14 DIAGNOSIS — I748 Embolism and thrombosis of other arteries: Secondary | ICD-10-CM

## 2022-11-14 DIAGNOSIS — I872 Venous insufficiency (chronic) (peripheral): Secondary | ICD-10-CM

## 2022-11-17 ENCOUNTER — Ambulatory Visit (INDEPENDENT_AMBULATORY_CARE_PROVIDER_SITE_OTHER): Payer: Medicare HMO | Admitting: Podiatry

## 2022-11-17 DIAGNOSIS — I96 Gangrene, not elsewhere classified: Secondary | ICD-10-CM | POA: Diagnosis not present

## 2022-11-17 DIAGNOSIS — E1151 Type 2 diabetes mellitus with diabetic peripheral angiopathy without gangrene: Secondary | ICD-10-CM | POA: Diagnosis not present

## 2022-11-17 DIAGNOSIS — Z794 Long term (current) use of insulin: Secondary | ICD-10-CM

## 2022-11-17 DIAGNOSIS — I739 Peripheral vascular disease, unspecified: Secondary | ICD-10-CM | POA: Diagnosis not present

## 2022-11-17 NOTE — Progress Notes (Signed)
Subjective:  Patient ID: Shawna Huerta, female    DOB: 1961/11/02,  MRN: [id number]  Chief Complaint  Patient presents with   Follow-up    Follow up on 5th toe, left foot Possible infection of 4th digit, left foot    62 y.o. female presents with concern in pain in the left fourth and fifth toe.  She was referred to vascular surgery after last visit due to concern for ischemic pain.  She states that she did see vascular surgery who have ordered some imaging including a CT angiogram of her chest abdomen pelvis for possible thromboembolic sources.  She is set to see vascular surgery again on December 01, 2022.  Additionally she notices that the left fourth toe has had black discoloration at the tip of the toe and she is concerned says it has been swelling slightly but it is not draining much.  Denies any nausea vomiting fever chills.  She says her pain is now primarily in the fourth toe and not as bad in the fifth toe.  Past Medical History:  Diagnosis Date   Allergy 2006   Contrast dye   Anemia    Arthritis    Bartholin cyst 07/03/2014   Colitis 08/2015   Treated at [facility]   Diabetes mellitus    Hypertension    Labial abscess 08/2015   Status post I&D by Dr. Ladona Horns   Nicotine addiction 09/03/2013   Obesity    Pancreatitis 09/2015   unknown etiology, no further work-up with gastroenterology    Tuberculosis 1970   Vaginal itching 09/03/2013    Allergies  Allergen Reactions   Iohexol Swelling     Code: HIVES, Desc: PT STATES THE EYES SWOLL AND ITCHED, NEEDS PRE MEDS     ROS: Negative except as per HPI above  Objective:  General: AAO x3, NAD  Dermatological: Attention directed to the fourth toe on the left foot there is noted to be dry gangrenous changes with necrosis of the distal tuft of the skin of the fourth toe.  No active drainage.  No significant erythema mild edema no malodor present.  Appears as relatively dry stable gangrene of the distal aspect of the left  fourth toe.    Vascular:  Dorsalis Pedis artery and Posterior Tibial artery pedal pulses are nonpalpable on the left lower extremity.  Capillary refill diminished to the fifth toe left foot  Neruologic: Grossly intact via light touch bilateral. Protective threshold intact to all sites bilateral.   Musculoskeletal: Pain with palpation of the distal tuft of the left fourth toe  Gait: Unassisted, Nonantalgic.    Assessment:   1. Gangrene of toe of left foot ([place])   2. PAD (peripheral artery disease) ([place])   3. Type 2 diabetes mellitus with diabetic peripheral angiopathy without gangrene, with long-term current use of insulin ([place])       Plan:  Patient was evaluated and treated and all questions answered.  # Dry gangrene of the distal aspect of the left fourth toe, peripheral arterial disease possibly thromboembolic in nature #DM2 with neuropathy and angiopathy -Discussed in detail with the patient that she does have gangrene of the distal aspect of the left fourth toe. -Based on vascular notes they are working her up for possible thromboembolic sources. -As she has now had increased tissue loss in the left fourth toe with necrosis she may ultimately end up needing angiogram of the left lower extremity will defer to vascular if this is indicated.  She is  seeing them on December 01, 2022. -I recommend continued monitoring of gangrene at this time with application of Betadine paint to the left fourth toe black necrotic area on a daily basis.  She should keep this area very dry avoid wrapping it with anything do not soak the foot or use hydrogen peroxide to clean the area. -Discussed that as the area is dry stable gangrene do not recommend antibiotic management at this time as no evidence of infection. -Patient will monitor the fourth toe for worsening discoloration edema malodor or nausea vomiting fever chills if these occur she should go to the emergency department for further evaluation  workup.  Return in about 4 weeks (around 12/15/2022) for Follow-up left fourth toe gangrene.          Everitt Amber, DPM [facility]

## 2022-11-27 ENCOUNTER — Other Ambulatory Visit: Payer: Self-pay | Admitting: Podiatry

## 2022-12-05 ENCOUNTER — Encounter: Payer: Medicare Other | Admitting: Family Medicine

## 2022-12-07 ENCOUNTER — Other Ambulatory Visit: Payer: Self-pay

## 2022-12-08 ENCOUNTER — Ambulatory Visit (HOSPITAL_COMMUNITY): Payer: Medicare HMO

## 2022-12-13 ENCOUNTER — Other Ambulatory Visit: Payer: Self-pay

## 2022-12-13 DIAGNOSIS — I748 Embolism and thrombosis of other arteries: Secondary | ICD-10-CM

## 2022-12-13 DIAGNOSIS — I872 Venous insufficiency (chronic) (peripheral): Secondary | ICD-10-CM

## 2022-12-13 MED ORDER — PREDNISONE 50 MG PO TABS: ORAL_TABLET | ORAL | 0 refills | Status: DC

## 2022-12-13 MED ORDER — DIPHENHYDRAMINE HCL 50 MG PO CAPS: ORAL_CAPSULE | ORAL | 0 refills | Status: DC

## 2022-12-15 ENCOUNTER — Ambulatory Visit: Payer: Medicare HMO | Admitting: Podiatry

## 2022-12-15 ENCOUNTER — Ambulatory Visit: Payer: Medicare HMO | Admitting: Vascular Surgery

## 2022-12-15 ENCOUNTER — Encounter: Payer: Self-pay | Admitting: Podiatry

## 2022-12-15 VITALS — BP 130/67 | HR 81

## 2022-12-15 DIAGNOSIS — I96 Gangrene, not elsewhere classified: Secondary | ICD-10-CM | POA: Diagnosis not present

## 2022-12-15 DIAGNOSIS — Z794 Long term (current) use of insulin: Secondary | ICD-10-CM

## 2022-12-15 DIAGNOSIS — I739 Peripheral vascular disease, unspecified: Secondary | ICD-10-CM | POA: Diagnosis not present

## 2022-12-15 DIAGNOSIS — E1151 Type 2 diabetes mellitus with diabetic peripheral angiopathy without gangrene: Secondary | ICD-10-CM | POA: Diagnosis not present

## 2022-12-15 MED ORDER — UREA 10 % EX CREA: TOPICAL_CREAM | CUTANEOUS | 0 refills | Status: DC | PRN

## 2022-12-15 NOTE — Progress Notes (Signed)
Subjective:  Patient ID: Shawna Huerta, female    DOB: 03/14/1961,  MRN: [id number]  Chief Complaint  Patient presents with   Follow-up    Patient is here for left foot 5th toe follow-up.patient states that the healing process is good.    62 y.o. female presents for follow-up on left fourth toe gangrene.  She has had ABI PVR testing done which did show abnormal toe waveform in the left foot.  However the ABI was within normal range on the left side.  She says that the toe has been feeling better since last appointment having less pain has been putting Betadine on the toe keeping very dry and not getting wet in the shower.  Thinks overall things are improved and is happy with the progress she is made.  Denies any nausea vomiting fever chills drainage from the toe or malodor.  Past Medical History:  Diagnosis Date   Allergy 2006   Contrast dye   Anemia    Arthritis    Bartholin cyst 07/03/2014   Colitis 08/2015   Treated at [facility]   Diabetes mellitus    Hypertension    Labial abscess 08/2015   Status post I&D by Dr. Ladona Horns   Nicotine addiction 09/03/2013   Obesity    Pancreatitis 09/2015   unknown etiology, no further work-up with gastroenterology    Tuberculosis 1970   Vaginal itching 09/03/2013    Allergies  Allergen Reactions   Iohexol Swelling     Code: HIVES, Desc: PT STATES THE EYES SWOLL AND ITCHED, NEEDS PRE MEDS     ROS: Negative except as per HPI above  Objective:  General: AAO x3, NAD  Dermatological: Attention directed to the fourth toe on the left foot there is noted to be dry gangrenous changes with necrosis of the distal tuft of the left fourth toe however looks improved from prior with decreased maceration dry stable gangrene no erythema edema drainage or malodor.    Vascular:  Dorsalis Pedis artery and Posterior Tibial artery pedal pulses are nonpalpable on the left   Neruologic: Grossly intact via light touch bilateral. Protective threshold  intact to all sites bilateral.   Musculoskeletal: Pain with palpation of the distal tuft of the left fourth toe  Gait: Unassisted, Nonantalgic.    Assessment:   1. Gangrene of toe of left foot ([place])   2. PAD (peripheral artery disease) ([place])   3. Type 2 diabetes mellitus with diabetic peripheral angiopathy without gangrene, with long-term current use of insulin ([place])        Plan:  Patient was evaluated and treated and all questions answered.  # Dry gangrene of the distal aspect of the left fourth toe, peripheral arterial disease possibly thromboembolic in nature #DM2 with neuropathy and angiopathy -Again discussed that the gangrene of her left fourth toe appears dry and stable no sign of infection currently -Recommend continuation of the wound care that she has been doing with daily Betadine paint applied to the toe and keeping it very dry -Will send urea 10% cream as the patient does have some xerosis of the skin bilateral foot she is not to put this anywhere near the fourth toe or fifth toe but can put on the plantar foot for dry skin. -Continue with vascular workup she is scheduled to see vascular specialist on December 21, 2022. -Discussed that if the gangrene appears to become macerated or the toe has any drainage erythema edema or malodor she should call and get in  sooner otherwise I will see her back in 6 weeks we will continue to monitor the toe.  I am hopeful that she will be able to avoid amputation and hopefully the necrotic portion can heal and under the eschar that is present.  Return in about 6 weeks (around 01/26/2023).          Everitt Amber, DPM [facility]

## 2022-12-16 ENCOUNTER — Other Ambulatory Visit: Payer: Self-pay | Admitting: Family Medicine

## 2022-12-16 DIAGNOSIS — E119 Type 2 diabetes mellitus without complications: Secondary | ICD-10-CM

## 2022-12-19 ENCOUNTER — Other Ambulatory Visit: Payer: Self-pay

## 2022-12-19 MED ORDER — PREDNISONE 50 MG PO TABS: ORAL_TABLET | ORAL | 0 refills | Status: DC

## 2022-12-19 NOTE — Telephone Encounter (Signed)
Pt called requesting help with a prescription.  Reviewed pt's chart, returned call for clarification, two identifiers used. Pt explained that the pharmacy never received the prescription for Prednisone. The script had no pharmacy associated with it. D/c'd that script and e-scribed Prednisone 50 mg with the same instructions to [facility] on [address], [place]. Confirmed understanding.

## 2022-12-21 ENCOUNTER — Ambulatory Visit (HOSPITAL_COMMUNITY)
Admission: RE | Admit: 2022-12-21 | Discharge: 2022-12-21 | Disposition: A | Discharge status: Home / Self Care | Payer: Medicare HMO | Source: Ambulatory Visit | Attending: Vascular Surgery | Admitting: Vascular Surgery

## 2022-12-21 DIAGNOSIS — R918 Other nonspecific abnormal finding of lung field: Secondary | ICD-10-CM | POA: Diagnosis not present

## 2022-12-21 DIAGNOSIS — I872 Venous insufficiency (chronic) (peripheral): Secondary | ICD-10-CM | POA: Insufficient documentation

## 2022-12-21 DIAGNOSIS — N179 Acute kidney failure, unspecified: Secondary | ICD-10-CM | POA: Diagnosis not present

## 2022-12-21 DIAGNOSIS — I748 Embolism and thrombosis of other arteries: Secondary | ICD-10-CM | POA: Insufficient documentation

## 2022-12-21 DIAGNOSIS — I700 Atherosclerosis of aorta: Secondary | ICD-10-CM | POA: Diagnosis not present

## 2022-12-21 LAB — POCT I-STAT CREATININE: Creatinine, Ser: 1.1 mg/dL — ABNORMAL HIGH (ref 0.44–1.00)

## 2022-12-21 MED ORDER — SODIUM CHLORIDE (PF) 0.9 % IJ SOLN
INTRAMUSCULAR | Status: AC
  Filled 2022-12-21: qty 50

## 2022-12-21 MED ORDER — IOHEXOL 350 MG/ML SOLN
100.0000 mL | Freq: Once | INTRAVENOUS | Status: AC | PRN
  Administered 2022-12-21: 100 mL via INTRAVENOUS

## 2022-12-22 ENCOUNTER — Encounter: Payer: Self-pay | Admitting: Vascular Surgery

## 2022-12-22 ENCOUNTER — Ambulatory Visit: Payer: Medicare HMO | Admitting: Vascular Surgery

## 2022-12-22 VITALS — BP 134/77 | HR 73 | Temp 98.0°F | Resp 20 | Ht 67.0 in | Wt 200.0 lb

## 2022-12-22 DIAGNOSIS — I748 Embolism and thrombosis of other arteries: Secondary | ICD-10-CM | POA: Diagnosis not present

## 2022-12-22 DIAGNOSIS — I872 Venous insufficiency (chronic) (peripheral): Secondary | ICD-10-CM

## 2022-12-22 NOTE — Progress Notes (Signed)
REASON FOR VISIT:   Follow-up of left fourth toe wound.  MEDICAL ISSUES:   POSSIBLE ATHEROEMBOLIC DISEASE: CT angio of the chest and CT angio of the abdomen and pelvis did not show any proximal source for embolization.  She has dry gangrene of the left fourth toe.  She has a palpable dorsalis pedis pulse.  Previous noninvasive studies showed biphasic Doppler signals in the dorsalis pedis and posterior tibial positions with an ABI of 100% and a toe pressure of 87 mmHg.  This would suggest adequate circulation for healing.  We will hold off on any further workup unless the wound fails to heal.  The patient is followed closely by Dr. Loel Lofty.  If the toe stops making progress or fails to heal then I would recommend arteriography.  She will let us know if the toe is not healing adequately.  HPI:   Shawna Huerta is a pleasant 62 y.o. female who I last saw on 10/13/2022.  She had developed the sudden discoloration of the left fifth toe.  I felt that this could potentially be an atheroembolic event.  Her left foot was well-perfused and she had biphasic Doppler signals.  However, on my exam she had monophasic signals in the dorsalis pedis artery.  I ordered a CT angio of the chest, abdomen, pelvis, and runoff to look for a proximal embolic source.  Of note she had just quit smoking a couple of weeks ago and I encouraged her to stay off cigarettes.  Asked her to begin taking aspirin.  She was on a statin.  She comes in today to discuss her CT angio results.  Of note she also had chronic venous insufficiency and we had discussed conservative measures.  Since I saw her last, she denies any significant leg pain.  She has dry gangrene on the left fifth toe and has been applying Betadine to this.  This has been gradually improving.  She has no claudication or rest pain.  Past Medical History:  Diagnosis Date   Allergy 2006   Contrast dye   Anemia    Arthritis    Bartholin cyst 07/03/2014   Colitis  08/2015   Treated at [facility]   Diabetes mellitus    Hyperlipidemia LDL goal <70 04/09/2022   Hypertension    Labial abscess 08/2015   Status post I&D by Dr. Ladona Horns   Nicotine addiction 09/03/2013   Obesity    Pancreatitis 09/2015   unknown etiology, no further work-up with gastroenterology    Tuberculosis 1970   Vaginal itching 09/03/2013    Family History  Problem Relation Age of Onset   Diabetes Mother    Stroke Mother    Cancer Father        liver and colon. Patient unsure primary    Stomach cancer Father    Diabetes Sister    Stomach cancer Sister    Liver cancer Sister    Bone cancer Sister    Diabetes Brother    Lung cancer Brother    Cancer Brother    Heart disease Son    Early death Son    Breast cancer Neg Hx     SOCIAL HISTORY: Social History   Tobacco Use   Smoking status: Former    Packs/day: 0.25    Years: 3.00    Total pack years: 0.75    Types: Cigarettes    Quit date: 10/21/2016    Years since quitting: 6.1   Smokeless tobacco: Never  Substance Use Topics   Alcohol use: No    Allergies  Allergen Reactions   Iohexol Swelling     Code: HIVES, Desc: PT STATES THE EYES SWOLL AND ITCHED, NEEDS PRE MEDS     Current Outpatient Medications  Medication Sig Dispense Refill   acetaminophen (TYLENOL) 325 MG tablet Take 650 mg by mouth every 6 (six) hours as needed for moderate pain.     amLODipine (NORVASC) 5 MG tablet Take 1 tablet (5 mg total) by mouth daily. 30 tablet 0   atorvastatin (LIPITOR) 10 MG tablet Take 1 tablet (10 mg total) by mouth daily. 90 tablet 3   diclofenac Sodium (VOLTAREN) 1 % GEL Apply 2 g topically daily as needed (pain).     diphenhydrAMINE (BENADRYL) 50 MG capsule Take 50 mg by mouth 1 hour prior to your procedure. 1 capsule 0   FARXIGA 5 MG TABS tablet TAKE 1 TABLET(5 MG) BY MOUTH DAILY BEFORE BREAKFAST 30 tablet 1   gabapentin (NEURONTIN) 300 MG capsule TAKE 1 CAPSULE(300 MG) BY MOUTH THREE TIMES DAILY 90  capsule 0   ibuprofen (ADVIL) 800 MG tablet Take 1 tablet (800 mg total) by mouth every 8 (eight) hours as needed. 30 tablet 0   Lancets (ONETOUCH ULTRASOFT) lancets Use as instructed 100 each 12   lisinopril (ZESTRIL) 20 MG tablet Take 1 tablet (20 mg total) by mouth daily. 90 tablet 3   metFORMIN (GLUCOPHAGE) 1000 MG tablet Take 1 tablet (1,000 mg total) by mouth 2 (two) times daily with a meal. 180 tablet 3   metoCLOPramide (REGLAN) 5 MG tablet Take 1 tablet (5 mg total) by mouth 4 (four) times daily. 30 tablet 2   ONETOUCH VERIO test strip once daily testing dx e11.65 100 each 5   predniSONE (DELTASONE) 50 MG tablet One tablet ('50mg'$ ) 13 hours prior to procedure; one tablet ('50mg'$ ) 7 hours prior to procedure and then one tablet (50 mg) one hour prior to procedure. 3 tablet 0   UNABLE TO FIND Blood glucose monitor kit check blood glucose twice daily dx: e11.65 1 each 0   UNABLE TO FIND Glucose test strips check blood glucose twice daily dx e11.65 100 each 2   urea (CARMOL) 10 % cream Apply topically as needed. 71 g 0   Vitamin D, Ergocalciferol, (DRISDOL) 1.25 MG (50000 UNIT) CAPS capsule Take 1 capsule (50,000 Units total) by mouth every 7 (seven) days. 5 capsule 2   No current facility-administered medications for this visit.    REVIEW OF SYSTEMS:  '[X]'$  denotes positive finding, '[ ]'$  denotes negative finding Cardiac  Comments:  Chest pain or chest pressure:    Shortness of breath upon exertion:    Short of breath when lying flat:    Irregular heart rhythm:        Vascular    Pain in calf, thigh, or hip brought on by ambulation:    Pain in feet at night that wakes you up from your sleep:     Blood clot in your veins:    Leg swelling:         Pulmonary    Oxygen at home:    Productive cough:     Wheezing:         Neurologic    Sudden weakness in arms or legs:     Sudden numbness in arms or legs:     Sudden onset of difficulty speaking or slurred speech:    Temporary loss of  vision in one  eye:     Problems with dizziness:         Gastrointestinal    Blood in stool:     Vomited blood:         Genitourinary    Burning when urinating:     Blood in urine:        Psychiatric    Major depression:         Hematologic    Bleeding problems:    Problems with blood clotting too easily:        Skin    Rashes or ulcers:        Constitutional    Fever or chills:     PHYSICAL EXAM:   Vitals:   12/22/22 1055  BP: 134/77  Pulse: 73  Resp: 20  Temp: 98 F (36.7 C)  SpO2: 98%  Weight: 200 lb (90.7 kg)  Height: '5\' 7"'$  (1.702 m)    GENERAL: The patient is a well-nourished female, in no acute distress. The vital signs are documented above. CARDIAC: There is a regular rate and rhythm.  VASCULAR: She has palpable femoral, popliteal, dorsalis pedis pulses bilaterally.  I cannot palpate posterior tibial pulses however she has biphasic posterior tibial signals with the Doppler bilaterally. PULMONARY: There is good air exchange bilaterally without wheezing or rales. MUSCULOSKELETAL: There are no major deformities or cyanosis. NEUROLOGIC: No focal weakness or paresthesias are detected. SKIN: She has dry gangrene of the left fourth toe as documented in the photograph below.     PSYCHIATRIC: The patient has a normal affect.  DATA:    CT ANGIO CHEST: The patient CT angio of the chest on 12/21/2022 showed no evidence of significant disease in the thoracic aorta which would would be a source of embolization.  CT ANGIO ABDOMEN PELVIS: I reviewed the CT angio of the abdomen and pelvis that was done on 08/02/2022.  There was no evidence of significant atherosclerotic disease in her aorta or iliac arteries which would be a potential source of embolization.  Deitra Mayo [facility] [phone]

## 2023-01-11 ENCOUNTER — Ambulatory Visit (INDEPENDENT_AMBULATORY_CARE_PROVIDER_SITE_OTHER): Payer: Medicare HMO | Admitting: Family Medicine

## 2023-01-11 ENCOUNTER — Encounter: Payer: Self-pay | Admitting: Family Medicine

## 2023-01-11 VITALS — BP 134/85 | HR 112 | Ht 67.0 in | Wt 186.1 lb

## 2023-01-11 DIAGNOSIS — E559 Vitamin D deficiency, unspecified: Secondary | ICD-10-CM

## 2023-01-11 DIAGNOSIS — I10 Essential (primary) hypertension: Secondary | ICD-10-CM | POA: Diagnosis not present

## 2023-01-11 DIAGNOSIS — E0789 Other specified disorders of thyroid: Secondary | ICD-10-CM | POA: Diagnosis not present

## 2023-01-11 DIAGNOSIS — M79672 Pain in left foot: Secondary | ICD-10-CM

## 2023-01-11 DIAGNOSIS — K3184 Gastroparesis: Secondary | ICD-10-CM | POA: Diagnosis not present

## 2023-01-11 DIAGNOSIS — E119 Type 2 diabetes mellitus without complications: Secondary | ICD-10-CM

## 2023-01-11 DIAGNOSIS — E1169 Type 2 diabetes mellitus with other specified complication: Secondary | ICD-10-CM | POA: Diagnosis not present

## 2023-01-11 DIAGNOSIS — A049 Bacterial intestinal infection, unspecified: Secondary | ICD-10-CM | POA: Diagnosis not present

## 2023-01-11 DIAGNOSIS — E785 Hyperlipidemia, unspecified: Secondary | ICD-10-CM

## 2023-01-11 MED ORDER — METOCLOPRAMIDE HCL 5 MG PO TABS: 5.0000 mg | ORAL_TABLET | Freq: Four times a day (QID) | ORAL | 2 refills | Status: DC

## 2023-01-11 MED ORDER — METFORMIN HCL 1000 MG PO TABS: 1000.0000 mg | ORAL_TABLET | Freq: Two times a day (BID) | ORAL | 3 refills | Status: DC

## 2023-01-11 MED ORDER — IBUPROFEN 800 MG PO TABS: 800.0000 mg | ORAL_TABLET | Freq: Three times a day (TID) | ORAL | 0 refills | Status: DC | PRN

## 2023-01-11 MED ORDER — LOPERAMIDE HCL 2 MG PO TABS: 2.0000 mg | ORAL_TABLET | Freq: Four times a day (QID) | ORAL | 0 refills | Status: DC | PRN

## 2023-01-11 MED ORDER — DAPAGLIFLOZIN PROPANEDIOL 5 MG PO TABS: ORAL_TABLET | ORAL | 1 refills | Status: DC

## 2023-01-11 MED ORDER — CIPROFLOXACIN HCL 500 MG PO TABS: 500.0000 mg | ORAL_TABLET | Freq: Two times a day (BID) | ORAL | 0 refills | Status: AC

## 2023-01-11 NOTE — Assessment & Plan Note (Addendum)
Onset of symptoms since January 2024 She complains of nausea, vomiting, and diarrhea with abdominal pain History of gastroparesis She denies fever, blood or pus in stool Denies consumption of unpasteurized dairy products, raw or uncooked meat or fish No recent travels reported No recent use of antibiotics Reports decreased appetite and fatigue She reports having 4 nonbilious emesis and 2-3 loose stools daily She complains of lightheadedness and dizziness which led to her fall on 01/07/2023 Will treat today with ciprofloxacin 500 mg twice daily for his 5 days Imodium 2 mg to take 4 times daily as needed for diarrhea Refilled Reglan for nausea and vomiting Encouraged to increase her fluid intake, at least 64 ounces daily Encouraged to eat boiled starches and cereals (eg, potatoes, noodles, rice, wheat, and oat) with salt are indicated in patients with watery diarrhea; crackers, bananas, soup, and boiled vegetables may also be consumed  Encouraged to avoid foods with high fat content until the gut function returns to normal after a severe bout of diarrhea

## 2023-01-11 NOTE — Assessment & Plan Note (Addendum)
She reports taking metformin 500 mg daily and  Farxiga 5 mg daily She is prescribed metformin at 1000 mg twice daily but reports side effects with higher dose Encouraged to take metformin 500 mg twice daily and Farxiga 5 mg daily Denies polyuria, polydipsia, polyphagia Will assess hemoglobin A1c today Lab Results  Component Value Date   HGBA1C 8.1 (H) 10/13/2022

## 2023-01-11 NOTE — Progress Notes (Signed)
Established Patient Office Visit  Subjective:  Patient ID: Shawna Huerta, female    DOB: 01/28/1961  Age: 62 y.o. MRN: [id number]  CC:  Chief Complaint  Patient presents with   Follow-up    Pt reports not feeling well today, hasn't had appetite, reports a fall in 12/06/22 and another fall on 01/07/23, from standing up and losing her balance. Feels very tired wore out and no energy.     HPI Shawna Huerta is a 62 y.o. female with past medical history of hypertension, type 2 diabetes, gastroparesis and hyperlipidemia presents for f/u of  chronic medical conditions. For the details of today's visit, please refer to the assessment and plan.     Past Medical History:  Diagnosis Date   Allergy 2006   Contrast dye   Anemia    Arthritis    Bartholin cyst 07/03/2014   Colitis 08/2015   Treated at [facility]   Diabetes mellitus    Hyperlipidemia LDL goal <70 04/09/2022   Hypertension    Labial abscess 08/2015   Status post I&D by Dr. Ladona Horns   Nicotine addiction 09/03/2013   Obesity    Pancreatitis 09/2015   unknown etiology, no further work-up with gastroenterology    Tuberculosis 1970   Vaginal itching 09/03/2013    Past Surgical History:  Procedure Laterality Date   ABDOMINAL HYSTERECTOMY     ABDOMINAL SURGERY     exploratry with TAH   BALLOON DILATION N/A 10/26/2016   Procedure: Pyloric channel BALLOON DILATION;  Surgeon: Daneil Dolin, MD;  Location: [facility];  Service: Endoscopy;  Laterality: N/A;   BARTHOLIN GLAND CYST EXCISION Right 08/20/2014   Procedure: EXCISION OF RIGHT BARTHOLIN'S GLAND CYST;  Surgeon: Florian Buff, MD;  Location: [facility];  Service: Gynecology;  Laterality: Right;   CHOLECYSTECTOMY     COLONOSCOPY  10/2015   Doristine Mango: mild diverticulosis, at least 8 polpys removed, multiple tubular adenomas. next tcs in 1 year   ESOPHAGOGASTRODUODENOSCOPY N/A 10/26/2016   Procedure: ESOPHAGOGASTRODUODENOSCOPY (EGD);  Surgeon: Daneil Dolin,  MD;  Location: [facility];  Service: Endoscopy;  Laterality: N/A;   FOOT SURGERY     FOOT SURGERY Right 2011   shave bone   TONSILLECTOMY     TUBAL LIGATION  1990    Family History  Problem Relation Age of Onset   Diabetes Mother    Stroke Mother    Cancer Father        liver and colon. Patient unsure primary    Stomach cancer Father    Diabetes Sister    Stomach cancer Sister    Liver cancer Sister    Bone cancer Sister    Diabetes Brother    Lung cancer Brother    Cancer Brother    Heart disease Son    Early death Son    Breast cancer Neg Hx     Social History   Socioeconomic History   Marital status: Single    Spouse name: Not on file   Number of children: 1   Years of education: Not on file   Highest education level: Not on file  Occupational History   Not on file  Tobacco Use   Smoking status: Former    Packs/day: 0.25    Years: 3.00    Total pack years: 0.75    Types: Cigarettes    Quit date: 10/21/2016    Years since quitting: 6.2   Smokeless tobacco: Never  Vaping Use   Vaping Use: Never used  Substance and Sexual Activity   Alcohol use: No   Drug use: Not Currently    Types: Marijuana    Comment: started using marijuana a month on the weekends to cope with depression.   Sexual activity: Not Currently    Birth control/protection: Surgical  Other Topics Concern   Not on file  Social History Narrative   Had one child now deceased   Lives with her sister, sister recently diagnosed with cancer.    Social Determinants of Health   Financial Resource Strain: Low Risk  (10/24/2022)   Overall Financial Resource Strain (CARDIA)    Difficulty of Paying Living Expenses: Not hard at all  Food Insecurity: No Food Insecurity (10/24/2022)   Hunger Vital Sign    Worried About Running Out of Food in the Last Year: Never true    Ran Out of Food in the Last Year: Never true  Transportation Needs: No Transportation Needs (08/03/2022)   PRAPARE -  Hydrologist (Medical): No    Lack of Transportation (Non-Medical): No  Physical Activity: Inactive (10/24/2022)   Exercise Vital Sign    Days of Exercise per Week: 0 days    Minutes of Exercise per Session: 0 min  Stress: Unknown (07/07/2019)   [place]    Feeling of Stress : Patient refused  Social Connections: Moderately Isolated (10/24/2022)   Social Connection and Isolation Panel [NHANES]    Frequency of Communication with Friends and Family: More than three times a week    Frequency of Social Gatherings with Friends and Family: Twice a week    Attends Religious Services: More than 4 times per year    Active Member of [employer] or Organizations: No    Attends Archivist Meetings: Never    Marital Status: Separated  Intimate Partner Violence: Not At Risk (08/03/2022)   Humiliation, Afraid, Rape, and Kick questionnaire    Fear of Current or Ex-Partner: No    Emotionally Abused: No    Physically Abused: No    Sexually Abused: No    Outpatient Medications Prior to Visit  Medication Sig Dispense Refill   acetaminophen (TYLENOL) 325 MG tablet Take 650 mg by mouth every 6 (six) hours as needed for moderate pain.     amLODipine (NORVASC) 5 MG tablet Take 1 tablet (5 mg total) by mouth daily. 30 tablet 0   atorvastatin (LIPITOR) 10 MG tablet Take 1 tablet (10 mg total) by mouth daily. 90 tablet 3   diclofenac Sodium (VOLTAREN) 1 % GEL Apply 2 g topically daily as needed (pain).     gabapentin (NEURONTIN) 300 MG capsule TAKE 1 CAPSULE(300 MG) BY MOUTH THREE TIMES DAILY 90 capsule 0   Lancets (ONETOUCH ULTRASOFT) lancets Use as instructed 100 each 12   lisinopril (ZESTRIL) 20 MG tablet Take 1 tablet (20 mg total) by mouth daily. 90 tablet 3   ONETOUCH VERIO test strip once daily testing dx e11.65 100 each 5   UNABLE TO FIND Blood glucose monitor kit check blood glucose twice daily dx:  e11.65 1 each 0   UNABLE TO FIND Glucose test strips check blood glucose twice daily dx e11.65 100 each 2   urea (CARMOL) 10 % cream Apply topically as needed. 71 g 0   Vitamin D, Ergocalciferol, (DRISDOL) 1.25 MG (50000 UNIT) CAPS capsule Take 1 capsule (50,000 Units total) by mouth every 7 (  seven) days. 5 capsule 2   FARXIGA 5 MG TABS tablet TAKE 1 TABLET(5 MG) BY MOUTH DAILY BEFORE BREAKFAST 30 tablet 1   ibuprofen (ADVIL) 800 MG tablet Take 1 tablet (800 mg total) by mouth every 8 (eight) hours as needed. 30 tablet 0   metFORMIN (GLUCOPHAGE) 1000 MG tablet Take 1 tablet (1,000 mg total) by mouth 2 (two) times daily with a meal. 180 tablet 3   metoCLOPramide (REGLAN) 5 MG tablet Take 1 tablet (5 mg total) by mouth 4 (four) times daily. 30 tablet 2   diphenhydrAMINE (BENADRYL) 50 MG capsule Take 50 mg by mouth 1 hour prior to your procedure. 1 capsule 0   predniSONE (DELTASONE) 50 MG tablet One tablet (22m) 13 hours prior to procedure; one tablet (55m 7 hours prior to procedure and then one tablet (50 mg) one hour prior to procedure. 3 tablet 0   No facility-administered medications prior to visit.    Allergies  Allergen Reactions   Iohexol Swelling     Code: HIVES, Desc: PT STATES THE EYES SWOLL AND ITCHED, NEEDS PRE MEDS     ROS Review of Systems  Constitutional:  Negative for chills and fever.  Eyes:  Negative for visual disturbance.  Respiratory:  Negative for chest tightness and shortness of breath.   Gastrointestinal:  Positive for abdominal pain, diarrhea, nausea and vomiting.  Neurological:  Negative for dizziness and headaches.      Objective:    Physical Exam HENT:     Head: Normocephalic.     Mouth/Throat:     Mouth: Mucous membranes are moist.  Cardiovascular:     Rate and Rhythm: Normal rate.     Heart sounds: Normal heart sounds.  Pulmonary:     Effort: Pulmonary effort is normal.     Breath sounds: Normal breath sounds.  Abdominal:     Tenderness:  There is abdominal tenderness in the epigastric area and periumbilical area. There is no right CVA tenderness or left CVA tenderness.  Neurological:     Mental Status: She is alert.     BP 134/85   Pulse (!) 112   Ht 5' 7"$  (1.702 m)   Wt 186 lb 1.3 oz (84.4 kg)   SpO2 97%   BMI 29.14 kg/m  Wt Readings from Last 3 Encounters:  01/11/23 186 lb 1.3 oz (84.4 kg)  12/22/22 200 lb (90.7 kg)  10/24/22 206 lb (93.4 kg)    Lab Results  Component Value Date   TSH 1.280 10/13/2022   Lab Results  Component Value Date   WBC 16.7 (H) 10/17/2022   HGB 14.8 10/17/2022   HCT 45.7 10/17/2022   MCV 67.7 (L) 10/17/2022   PLT 336 10/17/2022   Lab Results  Component Value Date   NA 127 (L) 10/17/2022   K 3.7 10/17/2022   CO2 23 10/17/2022   GLUCOSE 384 (H) 10/17/2022   BUN 32 (H) 10/17/2022   CREATININE 1.10 (H) 12/21/2022   BILITOT 0.7 10/17/2022   ALKPHOS 82 10/17/2022   AST 18 10/17/2022   ALT 14 10/17/2022   PROT 9.1 (H) 10/17/2022   ALBUMIN 4.1 10/17/2022   CALCIUM 9.1 10/17/2022   ANIONGAP 16 (H) 10/17/2022   EGFR 75 10/13/2022   Lab Results  Component Value Date   CHOL 128 10/13/2022   Lab Results  Component Value Date   HDL 43 10/13/2022   Lab Results  Component Value Date   LDLCALC 62 10/13/2022   Lab Results  Component Value Date   TRIG 128 10/13/2022   Lab Results  Component Value Date   CHOLHDL 3.0 10/13/2022   Lab Results  Component Value Date   HGBA1C 8.1 (H) 10/13/2022      Assessment & Plan:  Bacterial gastroenteritis Assessment & Plan: Onset of symptoms since January 2024 She complains of nausea, vomiting, and diarrhea with abdominal pain History of gastroparesis She denies fever, blood or pus in stool Denies consumption of unpasteurized dairy products, raw or uncooked meat or fish No recent travels reported No recent use of antibiotics Reports decreased appetite and fatigue She reports having 4 nonbilious emesis and 2-3 loose stools  daily She complains of lightheadedness and dizziness which led to her fall on 01/07/2023 Will treat today with ciprofloxacin 500 mg twice daily for his 5 days Imodium 2 mg to take 4 times daily as needed for diarrhea Refilled Reglan for nausea and vomiting Encouraged to increase her fluid intake, at least 64 ounces daily Encouraged to eat boiled starches and cereals (eg, potatoes, noodles, rice, wheat, and oat) with salt are indicated in patients with watery diarrhea; crackers, bananas, soup, and boiled vegetables may also be consumed  Encouraged to avoid foods with high fat content until the gut function returns to normal after a severe bout of diarrhea  Orders: -     Ciprofloxacin HCl; Take 1 tablet (500 mg total) by mouth 2 (two) times daily for 5 days.  Dispense: 10 tablet; Refill: 0  Type 2 diabetes mellitus with other specified complication, without long-term current use of insulin (HCC) Assessment & Plan: She reports taking metformin 500 mg daily and  Farxiga 5 mg daily She is prescribed metformin at 1000 mg twice daily but reports side effects with higher dose Encouraged to take metformin 500 mg twice daily and Farxiga 5 mg daily Denies polyuria, polydipsia, polyphagia Will assess hemoglobin A1c today Lab Results  Component Value Date   HGBA1C 8.1 (H) 10/13/2022      Type 2 diabetes mellitus without complication, without long-term current use of insulin (HCC) -     Dapagliflozin Propanediol; TAKE 1 TABLET(5 MG) BY MOUTH DAILY BEFORE BREAKFAST  Dispense: 30 tablet; Refill: 1 -     metFORMIN HCl; Take 1 tablet (1,000 mg total) by mouth 2 (two) times daily with a meal.  Dispense: 180 tablet; Refill: 3 -     Hemoglobin A1c -     Loperamide HCl; Take 1 tablet (2 mg total) by mouth 4 (four) times daily as needed for diarrhea or loose stools.  Dispense: 30 tablet; Refill: 0  Essential hypertension Assessment & Plan: Controlled Encouraged to continue taking amlodipine 5 mg and  lisinopril 20 mg daily No changes to treatment regimen BP Readings from Last 3 Encounters:  01/11/23 134/85  12/22/22 134/77  12/15/22 130/67     Orders: -     CMP14+EGFR -     CBC with Differential/Platelet  Foot pain, left -     Ibuprofen; Take 1 tablet (800 mg total) by mouth every 8 (eight) hours as needed.  Dispense: 30 tablet; Refill: 0  Gastroparesis -     Metoclopramide HCl; Take 1 tablet (5 mg total) by mouth 4 (four) times daily.  Dispense: 90 tablet; Refill: 2  Hyperlipidemia LDL goal <70 -     Lipid panel  Vitamin D deficiency -     VITAMIN D 25 Hydroxy (Vit-D Deficiency, Fractures)  Other specified disorders of thyroid -     TSH +  free T4    Follow-up: Return in about 3 months (around 04/11/2023).   Alvira Monday, FNP

## 2023-01-11 NOTE — Patient Instructions (Addendum)
I appreciate the opportunity to provide care to you today!    Follow up:  3 months  Labs: please stop by the lab today/ during the week to get your blood drawn (CBC, CMP, TSH, Lipid profile, HgA1c, Vit D)  Please pick up your medication at the pharmacy and start therapy    Bacterial Gastroenteritis Please start taking ciprofloxacin 500 mg twice a day for 5 days I recommend taking loperamide 2 mg by mouth 4 times daily as needed for diarrhea I recommend taking Reglan 5 mg 4 times daily for nausea and vomiting Please increase your fluid intake to at least 64 ounces daily Boiled starches and cereals (eg, potatoes, noodles, rice, wheat, and oat) with salt are indicated in patients with watery diarrhea; crackers, bananas, soup, and boiled vegetables may also be consumed  foods with high fat content should be avoided until the gut function returns to normal after a severe bout of diarrhea.   Please continue to a heart-healthy diet and increase your physical activities. Try to exercise for 58mns at least five times a week.      It was a pleasure to see you and I look forward to continuing to work together on your health and well-being. Please do not hesitate to call the office if you need care or have questions about your care.   Have a wonderful day and week. With Gratitude, GAlvira MondayMSN, FNP-BC

## 2023-01-11 NOTE — Assessment & Plan Note (Signed)
Controlled Encouraged to continue taking amlodipine 5 mg and lisinopril 20 mg daily No changes to treatment regimen BP Readings from Last 3 Encounters:  01/11/23 134/85  12/22/22 134/77  12/15/22 130/67

## 2023-01-12 LAB — CBC WITH DIFFERENTIAL/PLATELET
Basophils Absolute: 0 10*3/uL (ref 0.0–0.2)
Basos: 0 %
EOS (ABSOLUTE): 0 10*3/uL (ref 0.0–0.4)
Eos: 0 %
Hematocrit: 37.7 % (ref 34.0–46.6)
Hemoglobin: 11.9 g/dL (ref 11.1–15.9)
Immature Grans (Abs): 0 10*3/uL (ref 0.0–0.1)
Immature Granulocytes: 0 %
Lymphocytes Absolute: 3.4 10*3/uL — ABNORMAL HIGH (ref 0.7–3.1)
Lymphs: 25 %
MCH: 22 pg — ABNORMAL LOW (ref 26.6–33.0)
MCHC: 31.6 g/dL (ref 31.5–35.7)
MCV: 70 fL — ABNORMAL LOW (ref 79–97)
Monocytes Absolute: 0.7 10*3/uL (ref 0.1–0.9)
Monocytes: 5 %
Neutrophils Absolute: 9.7 10*3/uL — ABNORMAL HIGH (ref 1.4–7.0)
Neutrophils: 70 %
Platelets: 320 10*3/uL (ref 150–450)
RBC: 5.4 x10E6/uL — ABNORMAL HIGH (ref 3.77–5.28)
RDW: 17.3 % — ABNORMAL HIGH (ref 11.7–15.4)
WBC: 13.9 10*3/uL — ABNORMAL HIGH (ref 3.4–10.8)

## 2023-01-12 LAB — CMP14+EGFR
ALT: 12 IU/L (ref 0–32)
AST: 10 IU/L (ref 0–40)
Albumin/Globulin Ratio: 1.3 (ref 1.2–2.2)
Albumin: 4.5 g/dL (ref 3.8–4.9)
Alkaline Phosphatase: 75 IU/L (ref 44–121)
BUN/Creatinine Ratio: 26 — ABNORMAL HIGH (ref 9–23)
BUN: 104 mg/dL (ref 6–24)
Bilirubin Total: 0.6 mg/dL (ref 0.0–1.2)
CO2: 20 mmol/L (ref 20–29)
Calcium: 10.5 mg/dL — ABNORMAL HIGH (ref 8.7–10.2)
Chloride: 80 mmol/L — ABNORMAL LOW (ref 96–106)
Creatinine, Ser: 4.02 mg/dL — ABNORMAL HIGH (ref 0.57–1.00)
Globulin, Total: 3.5 g/dL (ref 1.5–4.5)
Glucose: 202 mg/dL — ABNORMAL HIGH (ref 70–99)
Potassium: 3.8 mmol/L (ref 3.5–5.2)
Sodium: 126 mmol/L — ABNORMAL LOW (ref 134–144)
Total Protein: 8 g/dL (ref 6.0–8.5)
eGFR: 12 mL/min/{1.73_m2} — ABNORMAL LOW (ref >59)

## 2023-01-12 LAB — VITAMIN D 25 HYDROXY (VIT D DEFICIENCY, FRACTURES): Vit D, 25-Hydroxy: 28.6 ng/mL — ABNORMAL LOW (ref 30.0–100.0)

## 2023-01-12 LAB — LIPID PANEL
Chol/HDL Ratio: 3.5 ratio (ref 0.0–4.4)
Cholesterol, Total: 148 mg/dL (ref 100–199)
HDL: 42 mg/dL (ref >39)
LDL Chol Calc (NIH): 80 mg/dL (ref 0–99)
Triglycerides: 149 mg/dL (ref 0–149)
VLDL Cholesterol Cal: 26 mg/dL (ref 5–40)

## 2023-01-12 LAB — HEMOGLOBIN A1C
Est. average glucose Bld gHb Est-mCnc: 209 mg/dL
Hgb A1c MFr Bld: 8.9 % — ABNORMAL HIGH (ref 4.8–5.6)

## 2023-01-12 LAB — TSH+FREE T4
Free T4: 2.07 ng/dL — ABNORMAL HIGH (ref 0.82–1.77)
TSH: 1.15 u[IU]/mL (ref 0.450–4.500)

## 2023-01-17 ENCOUNTER — Inpatient Hospital Stay (HOSPITAL_COMMUNITY)
Admission: EM | Admit: 2023-01-17 | Discharge: 2023-01-20 | DRG: 683 | Disposition: A | Discharge status: Home / Self Care | Payer: Medicare HMO | Attending: Internal Medicine | Admitting: Internal Medicine

## 2023-01-17 ENCOUNTER — Other Ambulatory Visit: Payer: Self-pay

## 2023-01-17 ENCOUNTER — Encounter: Payer: Self-pay | Admitting: Family Medicine

## 2023-01-17 ENCOUNTER — Telehealth (INDEPENDENT_AMBULATORY_CARE_PROVIDER_SITE_OTHER): Payer: Medicare HMO | Admitting: Family Medicine

## 2023-01-17 DIAGNOSIS — Z833 Family history of diabetes mellitus: Secondary | ICD-10-CM

## 2023-01-17 DIAGNOSIS — E860 Dehydration: Secondary | ICD-10-CM | POA: Diagnosis present

## 2023-01-17 DIAGNOSIS — E871 Hypo-osmolality and hyponatremia: Secondary | ICD-10-CM | POA: Diagnosis not present

## 2023-01-17 DIAGNOSIS — I959 Hypotension, unspecified: Secondary | ICD-10-CM | POA: Diagnosis not present

## 2023-01-17 DIAGNOSIS — N179 Acute kidney failure, unspecified: Secondary | ICD-10-CM | POA: Diagnosis not present

## 2023-01-17 DIAGNOSIS — N170 Acute kidney failure with tubular necrosis: Secondary | ICD-10-CM | POA: Diagnosis not present

## 2023-01-17 DIAGNOSIS — Z8611 Personal history of tuberculosis: Secondary | ICD-10-CM

## 2023-01-17 DIAGNOSIS — Z23 Encounter for immunization: Secondary | ICD-10-CM | POA: Diagnosis not present

## 2023-01-17 DIAGNOSIS — R197 Diarrhea, unspecified: Secondary | ICD-10-CM

## 2023-01-17 DIAGNOSIS — K3184 Gastroparesis: Secondary | ICD-10-CM | POA: Diagnosis present

## 2023-01-17 DIAGNOSIS — R1110 Vomiting, unspecified: Secondary | ICD-10-CM | POA: Diagnosis not present

## 2023-01-17 DIAGNOSIS — I10 Essential (primary) hypertension: Secondary | ICD-10-CM | POA: Diagnosis present

## 2023-01-17 DIAGNOSIS — E785 Hyperlipidemia, unspecified: Secondary | ICD-10-CM | POA: Diagnosis present

## 2023-01-17 DIAGNOSIS — E1165 Type 2 diabetes mellitus with hyperglycemia: Secondary | ICD-10-CM | POA: Diagnosis not present

## 2023-01-17 DIAGNOSIS — Z87891 Personal history of nicotine dependence: Secondary | ICD-10-CM

## 2023-01-17 DIAGNOSIS — Z79899 Other long term (current) drug therapy: Secondary | ICD-10-CM

## 2023-01-17 DIAGNOSIS — Z7984 Long term (current) use of oral hypoglycemic drugs: Secondary | ICD-10-CM

## 2023-01-17 DIAGNOSIS — Z90710 Acquired absence of both cervix and uterus: Secondary | ICD-10-CM

## 2023-01-17 DIAGNOSIS — Z9851 Tubal ligation status: Secondary | ICD-10-CM

## 2023-01-17 DIAGNOSIS — Z888 Allergy status to other drugs, medicaments and biological substances status: Secondary | ICD-10-CM

## 2023-01-17 DIAGNOSIS — Z800 Family history of malignant neoplasm of digestive organs: Secondary | ICD-10-CM

## 2023-01-17 DIAGNOSIS — E861 Hypovolemia: Secondary | ICD-10-CM | POA: Diagnosis present

## 2023-01-17 DIAGNOSIS — D509 Iron deficiency anemia, unspecified: Secondary | ICD-10-CM | POA: Diagnosis present

## 2023-01-17 DIAGNOSIS — Z91041 Radiographic dye allergy status: Secondary | ICD-10-CM

## 2023-01-17 LAB — COMPREHENSIVE METABOLIC PANEL
ALT: 13 U/L (ref 0–44)
AST: 10 U/L — ABNORMAL LOW (ref 15–41)
Albumin: 4.1 g/dL (ref 3.5–5.0)
Alkaline Phosphatase: 49 U/L (ref 38–126)
Anion gap: 17 — ABNORMAL HIGH (ref 5–15)
BUN: 124 mg/dL — ABNORMAL HIGH (ref 6–20)
CO2: 22 mmol/L (ref 22–32)
Calcium: 9.8 mg/dL (ref 8.9–10.3)
Chloride: 89 mmol/L — ABNORMAL LOW (ref 98–111)
Creatinine, Ser: 8.19 mg/dL — ABNORMAL HIGH (ref 0.44–1.00)
GFR, Estimated: 5 mL/min — ABNORMAL LOW (ref >60)
Glucose, Bld: 118 mg/dL — ABNORMAL HIGH (ref 70–99)
Potassium: 4.1 mmol/L (ref 3.5–5.1)
Sodium: 128 mmol/L — ABNORMAL LOW (ref 135–145)
Total Bilirubin: 0.5 mg/dL (ref 0.3–1.2)
Total Protein: 8.1 g/dL (ref 6.5–8.1)

## 2023-01-17 LAB — CBC
HCT: 33.3 % — ABNORMAL LOW (ref 36.0–46.0)
Hemoglobin: 10.6 g/dL — ABNORMAL LOW (ref 12.0–15.0)
MCH: 22.2 pg — ABNORMAL LOW (ref 26.0–34.0)
MCHC: 31.8 g/dL (ref 30.0–36.0)
MCV: 69.8 fL — ABNORMAL LOW (ref 80.0–100.0)
Platelets: 251 10*3/uL (ref 150–400)
RBC: 4.77 MIL/uL (ref 3.87–5.11)
RDW: 15.8 % — ABNORMAL HIGH (ref 11.5–15.5)
WBC: 10 10*3/uL (ref 4.0–10.5)
nRBC: 0 % (ref 0.0–0.2)

## 2023-01-17 LAB — LACTIC ACID, PLASMA: Lactic Acid, Venous: 1.1 mmol/L (ref 0.5–1.9)

## 2023-01-17 LAB — LIPASE, BLOOD: Lipase: 51 U/L (ref 11–51)

## 2023-01-17 MED ORDER — SODIUM CHLORIDE 0.9 % IV BOLUS
1000.0000 mL | Freq: Once | INTRAVENOUS | Status: AC
  Administered 2023-01-17: 1000 mL via INTRAVENOUS

## 2023-01-17 NOTE — ED Provider Notes (Incomplete)
[facility] Provider Note   CSN: [id number] Arrival date & time: 01/17/23  1612     History {Add pertinent medical, surgical, social history, OB history to HPI:1} Chief Complaint  Patient presents with  . Abnormal Lab  . Abdominal Pain    Shawna Huerta is a 62 y.o. female with a history including type 2 diabetes, pancreatitis, diabetes gastroparesis, hypertension who reports has frequent problems with nausea, vomiting and diarrhea, stating she frequently has difficulty determining whether it is her gastroparesis or some sort of GI bug, however over the past 3 days has had significant nausea vomiting and reports non bloody diarrhea for the past week.  She has not been able to keep any p.o. intake down in the past 48 hours.  He also endorses that she has not urinated in 3 days.  She had a video chat with her PCP today and had blood work completed as well, was called this afternoon stating her electrolytes were very abnormal and she needed to be here.  She endorses generalized fatigue and weakness, she also reports suprapubic pain, she denies fevers or chills, back pain, chest pain, shortness of breath.  The history is provided by the patient.       Home Medications Prior to Admission medications   Medication Sig Start Date End Date Taking? Authorizing Provider  acetaminophen (TYLENOL) 325 MG tablet Take 650 mg by mouth every 6 (six) hours as needed for moderate pain.   Yes [provider]  atorvastatin (LIPITOR) 10 MG tablet Take 1 tablet (10 mg total) by mouth daily. 02/18/22  Yes Paseda, Dewaine Conger, FNP  dapagliflozin propanediol (FARXIGA) 5 MG TABS tablet TAKE 1 TABLET(5 MG) BY MOUTH DAILY BEFORE BREAKFAST 01/11/23  Yes Alvira Monday, FNP  diclofenac Sodium (VOLTAREN) 1 % GEL Apply 2 g topically daily as needed (pain).   Yes [provider]  gabapentin (NEURONTIN) 300 MG capsule TAKE 1 CAPSULE(300 MG) BY MOUTH THREE TIMES  DAILY Patient taking differently: Take 300 mg by mouth as needed (pain). 11/29/22  Yes Standiford, Nena Alexander, DPM  ibuprofen (ADVIL) 800 MG tablet Take 1 tablet (800 mg total) by mouth every 8 (eight) hours as needed. 01/11/23  Yes Alvira Monday, FNP  lisinopril (ZESTRIL) 20 MG tablet Take 1 tablet (20 mg total) by mouth daily. 02/17/22  Yes Paseda, Dewaine Conger, FNP  loperamide (IMODIUM A-D) 2 MG tablet Take 1 tablet (2 mg total) by mouth 4 (four) times daily as needed for diarrhea or loose stools. 01/11/23  Yes Alvira Monday, FNP  metFORMIN (GLUCOPHAGE) 1000 MG tablet Take 1 tablet (1,000 mg total) by mouth 2 (two) times daily with a meal. 01/11/23  Yes Alvira Monday, FNP  metoCLOPramide (REGLAN) 5 MG tablet Take 1 tablet (5 mg total) by mouth 4 (four) times daily. 01/11/23  Yes Alvira Monday, FNP  urea (CARMOL) 10 % cream Apply topically as needed. 12/15/22  Yes Standiford, Nena Alexander, DPM  Vitamin D, Ergocalciferol, (DRISDOL) 1.25 MG (50000 UNIT) CAPS capsule Take 1 capsule (50,000 Units total) by mouth every 7 (seven) days. 10/19/22  Yes Alvira Monday, FNP  amLODipine (NORVASC) 5 MG tablet Take 1 tablet (5 mg total) by mouth daily. 06/20/22 10/25/23  Deatra James, MD  diphenhydrAMINE (BENADRYL) 50 MG capsule Take 50 mg by mouth 1 hour prior to your procedure. 12/13/22   Angelia Mould, MD  Lancets [facility] ULTRASOFT) lancets Use as instructed 04/13/22   Renee Rival, FNP  ONETOUCH VERIO test strip once daily testing dx e11.65 10/24/22   Alvira Monday, FNP  predniSONE (DELTASONE) 50 MG tablet One tablet (6m) 13 hours prior to procedure; one tablet (560m 7 hours prior to procedure and then one tablet (50 mg) one hour prior to procedure. 12/19/22   DiAngelia MouldMD  UNABLE TO FIND Blood glucose monitor kit check blood glucose twice daily dx: e11.65 04/12/22   PaRenee RivalFNP  UNABLE TO FIND Glucose test strips check blood glucose twice daily dx e11.65  04/12/22   PaRenee RivalFNP      Allergies    Iohexol    Review of Systems   Review of Systems  Constitutional:  Negative for chills and fever.  HENT:  Negative for congestion and sore throat.   Eyes: Negative.   Respiratory:  Negative for chest tightness and shortness of breath.   Cardiovascular:  Negative for chest pain.  Gastrointestinal:  Positive for abdominal pain, diarrhea, nausea and vomiting.  Genitourinary: Negative.   Musculoskeletal:  Negative for arthralgias, joint swelling and neck pain.  Skin: Negative.  Negative for rash and wound.  Neurological:  Negative for dizziness, weakness, light-headedness, numbness and headaches.  Psychiatric/Behavioral: Negative.      Physical Exam Updated Vital Signs BP (!) 102/59   Pulse 75   Temp 97.7 F (36.5 C) (Oral)   Resp 16   Ht 5' 7"$  (1.702 m)   Wt 83 kg   SpO2 100%   BMI 28.66 kg/m  Physical Exam HENT:     Mouth/Throat:     Mouth: Mucous membranes are dry.  Abdominal:     General: Bowel sounds are normal.     Tenderness: There is abdominal tenderness in the suprapubic area. There is no guarding or rebound.     ED Results / Procedures / Treatments   Labs (all labs ordered are listed, but only abnormal results are displayed) Labs Reviewed  COMPREHENSIVE METABOLIC PANEL - Abnormal; Notable for the following components:      Result Value   Sodium 128 (*)    Chloride 89 (*)    Glucose, Bld 118 (*)    BUN 124 (*)    Creatinine, Ser 8.19 (*)    AST 10 (*)    GFR, Estimated 5 (*)    Anion gap 17 (*)    All other components within normal limits  CBC - Abnormal; Notable for the following components:   Hemoglobin 10.6 (*)    HCT 33.3 (*)    MCV 69.8 (*)    MCH 22.2 (*)    RDW 15.8 (*)    All other components within normal limits  LIPASE, BLOOD  URINALYSIS, ROUTINE W REFLEX MICROSCOPIC  LACTIC ACID, PLASMA  LACTIC ACID, PLASMA    EKG None  Radiology No results found.  Procedures Procedures   {Document cardiac monitor, telemetry assessment procedure when appropriate:1}  Medications Ordered in ED Medications  sodium chloride 0.9 % bolus 1,000 mL (1,000 mLs Intravenous New Bag/Given 01/17/23 2206)    ED Course/ Medical Decision Making/ A&P   {   Click here for ABCD2, HEART and other calculatorsREFRESH Note before signing :1}                          Medical Decision Making Amount and/or Complexity of Data Reviewed Labs: ordered.     {Document critical care time when appropriate:1} {Document review of labs and clinical decision tools  ie heart score, Chads2Vasc2 etc:1}  {Document your independent review of radiology images, and any outside records:1} {Document your discussion with family members, caretakers, and with consultants:1} {Document social determinants of health affecting pt's care:1} {Document your decision making why or why not admission, treatments were needed:1} Final Clinical Impression(s) / ED Diagnoses Final diagnoses:  None    Rx / DC Orders ED Discharge Orders     None

## 2023-01-17 NOTE — ED Triage Notes (Signed)
Pt c/o intermittent vomiting x 2 days, diarrhea x 1 week, and mid lower abdominal pain x 1 month, called had a video chat with PCP and was told creatinine and "other labs are messed up" and was told to go to the ER. Denies fever.

## 2023-01-17 NOTE — ED Provider Notes (Signed)
[facility] Provider Note   CSN: [id number] Arrival date & time: 01/17/23  1612     History  Chief Complaint  Patient presents with   Abnormal Lab   Abdominal Pain    Shawna Huerta is a 62 y.o. female with a history including type 2 diabetes, pancreatitis, diabetes gastroparesis, hypertension who reports has frequent problems with nausea, vomiting and diarrhea, stating she frequently has difficulty determining whether it is her gastroparesis or some sort of GI bug, however over the past 3 days has had significant nausea vomiting and reports non bloody diarrhea for the past week.  She has not been able to keep any p.o. intake down in the past 48 hours.  He also endorses that she has not urinated in 3 days.  She had a video chat with her PCP today and had blood work completed as well, was called this afternoon stating her electrolytes were very abnormal and she needed to be here.  She endorses generalized fatigue and weakness, she also reports suprapubic pain, she denies fevers or chills, back pain, chest pain, shortness of breath.  The history is provided by the patient.       Home Medications Prior to Admission medications   Medication Sig Start Date End Date Taking? Authorizing Provider  acetaminophen (TYLENOL) 325 MG tablet Take 650 mg by mouth every 6 (six) hours as needed for moderate pain.   Yes [provider]  atorvastatin (LIPITOR) 10 MG tablet Take 1 tablet (10 mg total) by mouth daily. 02/18/22  Yes Paseda, Dewaine Conger, FNP  dapagliflozin propanediol (FARXIGA) 5 MG TABS tablet TAKE 1 TABLET(5 MG) BY MOUTH DAILY BEFORE BREAKFAST 01/11/23  Yes Alvira Monday, FNP  diclofenac Sodium (VOLTAREN) 1 % GEL Apply 2 g topically daily as needed (pain).   Yes [provider]  gabapentin (NEURONTIN) 300 MG capsule TAKE 1 CAPSULE(300 MG) BY MOUTH THREE TIMES DAILY Patient taking differently: Take 300 mg by mouth as needed (pain).  11/29/22  Yes Standiford, Nena Alexander, DPM  ibuprofen (ADVIL) 800 MG tablet Take 1 tablet (800 mg total) by mouth every 8 (eight) hours as needed. 01/11/23  Yes Alvira Monday, FNP  lisinopril (ZESTRIL) 20 MG tablet Take 1 tablet (20 mg total) by mouth daily. 02/17/22  Yes Paseda, Dewaine Conger, FNP  loperamide (IMODIUM A-D) 2 MG tablet Take 1 tablet (2 mg total) by mouth 4 (four) times daily as needed for diarrhea or loose stools. 01/11/23  Yes Alvira Monday, FNP  metFORMIN (GLUCOPHAGE) 1000 MG tablet Take 1 tablet (1,000 mg total) by mouth 2 (two) times daily with a meal. 01/11/23  Yes Alvira Monday, FNP  metoCLOPramide (REGLAN) 5 MG tablet Take 1 tablet (5 mg total) by mouth 4 (four) times daily. 01/11/23  Yes Alvira Monday, FNP  urea (CARMOL) 10 % cream Apply topically as needed. 12/15/22  Yes Standiford, Nena Alexander, DPM  Vitamin D, Ergocalciferol, (DRISDOL) 1.25 MG (50000 UNIT) CAPS capsule Take 1 capsule (50,000 Units total) by mouth every 7 (seven) days. 10/19/22  Yes Alvira Monday, FNP  amLODipine (NORVASC) 5 MG tablet Take 1 tablet (5 mg total) by mouth daily. 06/20/22 10/25/23  Deatra James, MD  diphenhydrAMINE (BENADRYL) 50 MG capsule Take 50 mg by mouth 1 hour prior to your procedure. 12/13/22   Angelia Mould, MD  Lancets [facility] ULTRASOFT) lancets Use as instructed 04/13/22   Renee Rival, FNP  [facility] VERIO test strip once daily testing dx  e11.65 10/24/22   Alvira Monday, FNP  predniSONE (DELTASONE) 50 MG tablet One tablet (53m) 13 hours prior to procedure; one tablet (577m 7 hours prior to procedure and then one tablet (50 mg) one hour prior to procedure. 12/19/22   DiAngelia MouldMD  UNABLE TO FIND Blood glucose monitor kit check blood glucose twice daily dx: e11.65 04/12/22   PaRenee RivalFNP  UNABLE TO FIND Glucose test strips check blood glucose twice daily dx e11.65 04/12/22   PaRenee RivalFNP      Allergies    Iohexol    Review  of Systems   Review of Systems  Constitutional:  Negative for chills and fever.  HENT:  Negative for congestion and sore throat.   Eyes: Negative.   Respiratory:  Negative for chest tightness and shortness of breath.   Cardiovascular:  Negative for chest pain.  Gastrointestinal:  Positive for abdominal pain, diarrhea, nausea and vomiting.  Genitourinary: Negative.   Musculoskeletal:  Negative for arthralgias, joint swelling and neck pain.  Skin: Negative.  Negative for rash and wound.  Neurological:  Negative for dizziness, weakness, light-headedness, numbness and headaches.  Psychiatric/Behavioral: Negative.      Physical Exam Updated Vital Signs BP (!) 102/59   Pulse 75   Temp 97.7 F (36.5 C) (Oral)   Resp 16   Ht 5' 7"$  (1.702 m)   Wt 83 kg   SpO2 100%   BMI 28.66 kg/m  Physical Exam HENT:     Mouth/Throat:     Mouth: Mucous membranes are dry.  Abdominal:     General: Bowel sounds are normal.     Tenderness: There is abdominal tenderness in the suprapubic area. There is no guarding or rebound.     ED Results / Procedures / Treatments   Labs (all labs ordered are listed, but only abnormal results are displayed) Labs Reviewed  COMPREHENSIVE METABOLIC PANEL - Abnormal; Notable for the following components:      Result Value   Sodium 128 (*)    Chloride 89 (*)    Glucose, Bld 118 (*)    BUN 124 (*)    Creatinine, Ser 8.19 (*)    AST 10 (*)    GFR, Estimated 5 (*)    Anion gap 17 (*)    All other components within normal limits  CBC - Abnormal; Notable for the following components:   Hemoglobin 10.6 (*)    HCT 33.3 (*)    MCV 69.8 (*)    MCH 22.2 (*)    RDW 15.8 (*)    All other components within normal limits  LIPASE, BLOOD  LACTIC ACID, PLASMA  URINALYSIS, ROUTINE W REFLEX MICROSCOPIC  LACTIC ACID, PLASMA    EKG None  Radiology No results found.  Procedures Procedures    Medications Ordered in ED Medications  sodium chloride 0.9 % bolus  1,000 mL (1,000 mLs Intravenous New Bag/Given 01/17/23 2206)    ED Course/ Medical Decision Making/ A&P                             Medical Decision Making Patient presenting with vomiting and diarrhea, suprapubic abdominal pain, sent by her PCP secondary to abnormal lab results from earlier today.  She had complaints of suprapubic pressure, bladder scan was performed, she is not retaining urine, bladder scan volume of 141 mL.  She has a soft blood pressure, however she is afebrile, normal  pulse rate and respiratory rate, her lactic acid is normal, this is not sepsis.  Amount and/or Complexity of Data Reviewed Labs: ordered.    Details:  Today's labs are significant for hyponatremia with a sodium of 128, she is also in acute renal failure secondary to dehydration, she has a BUN of 124 and a creatinine of 8.19.  Her potassium is stable at 4.1.  She has a normal WBC count at 10.0, hemoglobin is 10.6, this is a microcytic anemia   Risk Decision regarding hospitalization.           Final Clinical Impression(s) / ED Diagnoses Final diagnoses:  Acute renal failure, unspecified acute renal failure type [facility])  Dehydration    Rx / DC Orders ED Discharge Orders     None         Landis Martins 01/18/23 0013    Godfrey Pick, MD 01/19/23 [phone]

## 2023-01-17 NOTE — Progress Notes (Signed)
Virtual Visit via Video Note  I connected with Shawna Huerta on 01/17/23 at  4:40 PM EST by a video enabled telemedicine application and verified that I am speaking with the correct person using two identifiers.  Patient Location: Home Provider Location: Office/Clinic  I discussed the limitations, risks, security, and privacy concerns of performing an evaluation and management service by video and the availability of in person appointments. I also discussed with the patient that there may be a patient responsible charge related to this service. The patient expressed understanding and agreed to proceed.  Subjective: PCP: Alvira Monday, FNP  Chief Complaint  Patient presents with   Emesis    Pt reports vomiting, since 01/15/23, reports taking medication in the am, and right after she throws up, concerned because she is not getting her medication in her system, has been having shakiness and abdominal pain is still present.   HPI Patient was treated for bacterial gastritis on 01/11/2023.  BMP showed elevated BUN of 104, low sodium of 126, elevated creatinine and calcium and EGFR of 12.  The patient reports poor fluid and p.o. intake, noting that she vomits right after taking her medications and is unable to keep anything down.  ROS: Per HPI  Current Outpatient Medications:    acetaminophen (TYLENOL) 325 MG tablet, Take 650 mg by mouth every 6 (six) hours as needed for moderate pain., Disp: , Rfl:    amLODipine (NORVASC) 5 MG tablet, Take 1 tablet (5 mg total) by mouth daily., Disp: 30 tablet, Rfl: 0   atorvastatin (LIPITOR) 10 MG tablet, Take 1 tablet (10 mg total) by mouth daily., Disp: 90 tablet, Rfl: 3   dapagliflozin propanediol (FARXIGA) 5 MG TABS tablet, TAKE 1 TABLET(5 MG) BY MOUTH DAILY BEFORE BREAKFAST, Disp: 30 tablet, Rfl: 1   diclofenac Sodium (VOLTAREN) 1 % GEL, Apply 2 g topically daily as needed (pain)., Disp: , Rfl:    gabapentin (NEURONTIN) 300 MG capsule, TAKE 1  CAPSULE(300 MG) BY MOUTH THREE TIMES DAILY, Disp: 90 capsule, Rfl: 0   ibuprofen (ADVIL) 800 MG tablet, Take 1 tablet (800 mg total) by mouth every 8 (eight) hours as needed., Disp: 30 tablet, Rfl: 0   Lancets (ONETOUCH ULTRASOFT) lancets, Use as instructed, Disp: 100 each, Rfl: 12   lisinopril (ZESTRIL) 20 MG tablet, Take 1 tablet (20 mg total) by mouth daily., Disp: 90 tablet, Rfl: 3   loperamide (IMODIUM A-D) 2 MG tablet, Take 1 tablet (2 mg total) by mouth 4 (four) times daily as needed for diarrhea or loose stools., Disp: 30 tablet, Rfl: 0   metFORMIN (GLUCOPHAGE) 1000 MG tablet, Take 1 tablet (1,000 mg total) by mouth 2 (two) times daily with a meal., Disp: 180 tablet, Rfl: 3   metoCLOPramide (REGLAN) 5 MG tablet, Take 1 tablet (5 mg total) by mouth 4 (four) times daily., Disp: 90 tablet, Rfl: 2   ONETOUCH VERIO test strip, once daily testing dx e11.65, Disp: 100 each, Rfl: 5   UNABLE TO FIND, Blood glucose monitor kit check blood glucose twice daily dx: e11.65, Disp: 1 each, Rfl: 0   UNABLE TO FIND, Glucose test strips check blood glucose twice daily dx e11.65, Disp: 100 each, Rfl: 2   urea (CARMOL) 10 % cream, Apply topically as needed., Disp: 71 g, Rfl: 0   Vitamin D, Ergocalciferol, (DRISDOL) 1.25 MG (50000 UNIT) CAPS capsule, Take 1 capsule (50,000 Units total) by mouth every 7 (seven) days., Disp: 5 capsule, Rfl: 2   diphenhydrAMINE (  BENADRYL) 50 MG capsule, Take 50 mg by mouth 1 hour prior to your procedure., Disp: 1 capsule, Rfl: 0   predniSONE (DELTASONE) 50 MG tablet, One tablet (65m) 13 hours prior to procedure; one tablet (553m 7 hours prior to procedure and then one tablet (50 mg) one hour prior to procedure., Disp: 3 tablet, Rfl: 0  Observations/Objective: There were no vitals filed for this visit. Physical Exam No labored breathing.  Speech is clear and coherent with logical content.  Patient is alert and oriented at baseline.   Assessment and  Plan: Gastroparesis  Encourage patient to report to the ED for IV fluids and emergent care Report provided to the ED provider Follow Up Instructions: No follow-ups on file.   I discussed the assessment and treatment plan with the patient. The patient was provided an opportunity to ask questions, and all were answered. The patient agreed with the plan and demonstrated an understanding of the instructions.   The patient was advised to call back or seek an in-person evaluation if the symptoms worsen or if the condition fails to improve as anticipated.  The above assessment and management plan was discussed with the patient. The patient verbalized understanding of and has agreed to the management plan.   GlAlvira MondayFNP

## 2023-01-18 ENCOUNTER — Inpatient Hospital Stay (HOSPITAL_COMMUNITY): Payer: Medicare HMO

## 2023-01-18 ENCOUNTER — Encounter (HOSPITAL_COMMUNITY): Payer: Self-pay | Admitting: Family Medicine

## 2023-01-18 ENCOUNTER — Other Ambulatory Visit: Payer: Self-pay | Admitting: Family Medicine

## 2023-01-18 DIAGNOSIS — N179 Acute kidney failure, unspecified: Secondary | ICD-10-CM | POA: Diagnosis not present

## 2023-01-18 DIAGNOSIS — Z90710 Acquired absence of both cervix and uterus: Secondary | ICD-10-CM | POA: Diagnosis not present

## 2023-01-18 DIAGNOSIS — Z9851 Tubal ligation status: Secondary | ICD-10-CM | POA: Diagnosis not present

## 2023-01-18 DIAGNOSIS — Z8611 Personal history of tuberculosis: Secondary | ICD-10-CM | POA: Diagnosis not present

## 2023-01-18 DIAGNOSIS — Z888 Allergy status to other drugs, medicaments and biological substances status: Secondary | ICD-10-CM | POA: Diagnosis not present

## 2023-01-18 DIAGNOSIS — Z833 Family history of diabetes mellitus: Secondary | ICD-10-CM | POA: Diagnosis not present

## 2023-01-18 DIAGNOSIS — E1165 Type 2 diabetes mellitus with hyperglycemia: Secondary | ICD-10-CM | POA: Diagnosis not present

## 2023-01-18 DIAGNOSIS — R197 Diarrhea, unspecified: Secondary | ICD-10-CM | POA: Diagnosis not present

## 2023-01-18 DIAGNOSIS — Z87891 Personal history of nicotine dependence: Secondary | ICD-10-CM | POA: Diagnosis not present

## 2023-01-18 DIAGNOSIS — R109 Unspecified abdominal pain: Secondary | ICD-10-CM | POA: Diagnosis not present

## 2023-01-18 DIAGNOSIS — Z79899 Other long term (current) drug therapy: Secondary | ICD-10-CM | POA: Diagnosis not present

## 2023-01-18 DIAGNOSIS — E1169 Type 2 diabetes mellitus with other specified complication: Secondary | ICD-10-CM

## 2023-01-18 DIAGNOSIS — E871 Hypo-osmolality and hyponatremia: Secondary | ICD-10-CM | POA: Diagnosis not present

## 2023-01-18 DIAGNOSIS — Z23 Encounter for immunization: Secondary | ICD-10-CM | POA: Diagnosis not present

## 2023-01-18 DIAGNOSIS — R112 Nausea with vomiting, unspecified: Secondary | ICD-10-CM | POA: Diagnosis not present

## 2023-01-18 DIAGNOSIS — D509 Iron deficiency anemia, unspecified: Secondary | ICD-10-CM | POA: Diagnosis not present

## 2023-01-18 DIAGNOSIS — E861 Hypovolemia: Secondary | ICD-10-CM | POA: Diagnosis not present

## 2023-01-18 DIAGNOSIS — I959 Hypotension, unspecified: Secondary | ICD-10-CM | POA: Diagnosis not present

## 2023-01-18 DIAGNOSIS — Z91041 Radiographic dye allergy status: Secondary | ICD-10-CM | POA: Diagnosis not present

## 2023-01-18 DIAGNOSIS — E860 Dehydration: Secondary | ICD-10-CM | POA: Diagnosis not present

## 2023-01-18 DIAGNOSIS — Z7984 Long term (current) use of oral hypoglycemic drugs: Secondary | ICD-10-CM | POA: Diagnosis not present

## 2023-01-18 DIAGNOSIS — K3184 Gastroparesis: Secondary | ICD-10-CM | POA: Diagnosis not present

## 2023-01-18 DIAGNOSIS — I10 Essential (primary) hypertension: Secondary | ICD-10-CM | POA: Diagnosis not present

## 2023-01-18 DIAGNOSIS — N170 Acute kidney failure with tubular necrosis: Secondary | ICD-10-CM | POA: Diagnosis not present

## 2023-01-18 DIAGNOSIS — Z800 Family history of malignant neoplasm of digestive organs: Secondary | ICD-10-CM | POA: Diagnosis not present

## 2023-01-18 DIAGNOSIS — E785 Hyperlipidemia, unspecified: Secondary | ICD-10-CM | POA: Diagnosis not present

## 2023-01-18 LAB — URINALYSIS, COMPLETE (UACMP) WITH MICROSCOPIC
Bilirubin Urine: NEGATIVE
Glucose, UA: 100 mg/dL — AB
Hgb urine dipstick: NEGATIVE
Ketones, ur: NEGATIVE mg/dL
Nitrite: NEGATIVE
Protein, ur: NEGATIVE mg/dL
Specific Gravity, Urine: 1.01 (ref 1.005–1.030)
pH: 6 (ref 5.0–8.0)

## 2023-01-18 LAB — URINALYSIS, W/ REFLEX TO CULTURE (INFECTION SUSPECTED)
Bilirubin Urine: NEGATIVE
Glucose, UA: 150 mg/dL — AB
Hgb urine dipstick: NEGATIVE
Ketones, ur: NEGATIVE mg/dL
Leukocytes,Ua: NEGATIVE
Nitrite: NEGATIVE
Protein, ur: NEGATIVE mg/dL
Specific Gravity, Urine: 1.008 (ref 1.005–1.030)
pH: 5 (ref 5.0–8.0)

## 2023-01-18 LAB — CREATININE, URINE, RANDOM: Creatinine, Urine: 146 mg/dL

## 2023-01-18 LAB — GLUCOSE, CAPILLARY
Glucose-Capillary: 68 mg/dL — ABNORMAL LOW (ref 70–99)
Glucose-Capillary: 64 mg/dL — ABNORMAL LOW (ref 70–99)
Glucose-Capillary: 111 mg/dL — ABNORMAL HIGH (ref 70–99)
Glucose-Capillary: 120 mg/dL — ABNORMAL HIGH (ref 70–99)

## 2023-01-18 LAB — BASIC METABOLIC PANEL
Anion gap: 14 (ref 5–15)
BUN: 117 mg/dL — ABNORMAL HIGH (ref 6–20)
CO2: 21 mmol/L — ABNORMAL LOW (ref 22–32)
Calcium: 9.1 mg/dL (ref 8.9–10.3)
Chloride: 95 mmol/L — ABNORMAL LOW (ref 98–111)
Creatinine, Ser: 7.39 mg/dL — ABNORMAL HIGH (ref 0.44–1.00)
GFR, Estimated: 6 mL/min — ABNORMAL LOW (ref >60)
Glucose, Bld: 99 mg/dL (ref 70–99)
Potassium: 3.8 mmol/L (ref 3.5–5.1)
Sodium: 130 mmol/L — ABNORMAL LOW (ref 135–145)

## 2023-01-18 LAB — CBC
HCT: 30.4 % — ABNORMAL LOW (ref 36.0–46.0)
Hemoglobin: 9.7 g/dL — ABNORMAL LOW (ref 12.0–15.0)
MCH: 22.4 pg — ABNORMAL LOW (ref 26.0–34.0)
MCHC: 31.9 g/dL (ref 30.0–36.0)
MCV: 70.2 fL — ABNORMAL LOW (ref 80.0–100.0)
Platelets: 204 10*3/uL (ref 150–400)
RBC: 4.33 MIL/uL (ref 3.87–5.11)
RDW: 15.8 % — ABNORMAL HIGH (ref 11.5–15.5)
WBC: 8.8 10*3/uL (ref 4.0–10.5)
nRBC: 0 % (ref 0.0–0.2)

## 2023-01-18 LAB — CBG MONITORING, ED
Glucose-Capillary: 80 mg/dL (ref 70–99)
Glucose-Capillary: 82 mg/dL (ref 70–99)
Glucose-Capillary: 82 mg/dL (ref 70–99)

## 2023-01-18 LAB — PROTEIN / CREATININE RATIO, URINE
Creatinine, Urine: 143 mg/dL
Protein Creatinine Ratio: 0.17 mg/mg{Cre} — ABNORMAL HIGH (ref 0.00–0.15)
Total Protein, Urine: 24 mg/dL

## 2023-01-18 LAB — SODIUM, URINE, RANDOM: Sodium, Ur: 30 mmol/L

## 2023-01-18 MED ORDER — SODIUM CHLORIDE 0.9 % IV SOLN: INTRAVENOUS | Status: DC

## 2023-01-18 MED ORDER — INFLUENZA VAC SPLIT QUAD 0.5 ML IM SUSY
0.5000 mL | PREFILLED_SYRINGE | INTRAMUSCULAR | Status: AC
  Administered 2023-01-19: 0.5 mL via INTRAMUSCULAR
  Filled 2023-01-18: qty 0.5

## 2023-01-18 MED ORDER — ONDANSETRON HCL 4 MG PO TABS: 4.0000 mg | ORAL_TABLET | Freq: Four times a day (QID) | ORAL | Status: DC | PRN

## 2023-01-18 MED ORDER — SODIUM CHLORIDE 0.9% FLUSH
3.0000 mL | Freq: Two times a day (BID) | INTRAVENOUS | Status: DC
  Administered 2023-01-18 – 2023-01-19 (×3): 3 mL via INTRAVENOUS

## 2023-01-18 MED ORDER — DEXTROSE-NACL 5-0.9 % IV SOLN: INTRAVENOUS | Status: DC

## 2023-01-18 MED ORDER — INSULIN ASPART 100 UNIT/ML IJ SOLN: 0.0000 [IU] | Freq: Every day | INTRAMUSCULAR | Status: DC

## 2023-01-18 MED ORDER — SODIUM CHLORIDE 0.9 % IV BOLUS
1000.0000 mL | Freq: Once | INTRAVENOUS | Status: AC
  Administered 2023-01-18: 1000 mL via INTRAVENOUS

## 2023-01-18 MED ORDER — ACETAMINOPHEN 325 MG PO TABS
650.0000 mg | ORAL_TABLET | Freq: Four times a day (QID) | ORAL | Status: DC | PRN
  Administered 2023-01-20: 650 mg via ORAL
  Filled 2023-01-18: qty 2

## 2023-01-18 MED ORDER — DAPAGLIFLOZIN PROPANEDIOL 10 MG PO TABS: 10.0000 mg | ORAL_TABLET | Freq: Every day | ORAL | 2 refills | Status: DC

## 2023-01-18 MED ORDER — OXYCODONE HCL 5 MG PO TABS
5.0000 mg | ORAL_TABLET | ORAL | Status: DC | PRN
  Administered 2023-01-18 – 2023-01-19 (×8): 5 mg via ORAL
  Filled 2023-01-18 (×8): qty 1

## 2023-01-18 MED ORDER — INSULIN ASPART 100 UNIT/ML IJ SOLN: 0.0000 [IU] | Freq: Three times a day (TID) | INTRAMUSCULAR | Status: DC

## 2023-01-18 MED ORDER — HEPARIN SODIUM (PORCINE) 5000 UNIT/ML IJ SOLN
5000.0000 [IU] | Freq: Three times a day (TID) | INTRAMUSCULAR | Status: DC
  Administered 2023-01-18 – 2023-01-20 (×7): 5000 [IU] via SUBCUTANEOUS
  Filled 2023-01-18 (×7): qty 1

## 2023-01-18 MED ORDER — TRULICITY 0.75 MG/0.5ML ~~LOC~~ SOAJ: 0.7500 mg | SUBCUTANEOUS | 1 refills | Status: DC

## 2023-01-18 MED ORDER — ACETAMINOPHEN 650 MG RE SUPP: 650.0000 mg | Freq: Four times a day (QID) | RECTAL | Status: DC | PRN

## 2023-01-18 MED ORDER — ATORVASTATIN CALCIUM 10 MG PO TABS
10.0000 mg | ORAL_TABLET | Freq: Every day | ORAL | Status: DC
  Administered 2023-01-18 – 2023-01-20 (×3): 10 mg via ORAL
  Filled 2023-01-18 (×3): qty 1

## 2023-01-18 MED ORDER — ONDANSETRON HCL 4 MG/2ML IJ SOLN: 4.0000 mg | Freq: Four times a day (QID) | INTRAMUSCULAR | Status: DC | PRN

## 2023-01-18 NOTE — ED Notes (Signed)
Patient transported to CT 

## 2023-01-18 NOTE — ED Notes (Signed)
Report given to RN and was told room needs to be cleaned. Pt can come up in 20 to 30 mins

## 2023-01-18 NOTE — Progress Notes (Signed)
PROGRESS NOTE Shawna Huerta  [id number] DOB: 03-Jan-1961 DOA: 01/17/2023 PCP: Alvira Monday, FNP   Brief Narrative/Hospital Course: 77 yof w/ hypertension on lisinopril/amlodipine, hyperlipidemia, and type 2 diabetes mellitus on metformin/Farxiga/Trulicity presented with loose stool 2-3/day x 2 to 3 weeks and recently with nausea, nonbloody vomiting, seen on 01/11/2023 and treated with ciprofloxacin presenting with worsening of her symptoms and with abnormal lab of elevated creatinine at PCP office.  Patient complaining of mild to moderate pain in the mid and lower abdomen no fever or chills. In the ED BP soft 90s to 100, afebrile.  Labs shows hyponatremia 128 elevated BUN/creatinine 124/8.1, was 104/4.1 on 2/14 baseline 1.1 in Jan/24/24, potassium 4.1 bicarb 22 anion gap 17, normal LFTs, microcytic anemia hemoglobin 10.6 g, UA with WBC 11-20, RBC 0-5 bacteria many, leukocyte UA trace protein negative, protein/creatinine 0.17.  CT abdomen pelvis without contrast no acute finding, no hydronephrosis/nephroureterolithiasis or solid renal mass.  1 L bolus normal saline given, patient was admitted for further management     Subjective: Seen thsi am Overnight BP has been soft MAP holding above 65, afebrile. C/o abdomen pain in center and lower No nausea or vomiting Had diarrhea x1 this am No chest pain Voiding this am " It was a lot" she says   Assessment and Plan: Principal Problem:   Acute renal failure (ARF) (HCC) Active Problems:   Uncontrolled type 2 diabetes mellitus with hyperglycemia, without long-term current use of insulin (HCC)   Vomiting and diarrhea   Hyponatremia   Essential hypertension   Microcytic anemia   Severe AKI/ATN: Likely monitor effectively in the setting of prerenal volume depletion with patient's diarrhea nausea vomiting, also with lisinopril/metformin/NSAID use- ibuprofen 833m bid since 2/14. CT imaging no hydronephrosis.  BP has been soft> giving more bolus IV  fluids, and continue IV fluid hydration  at 150 cc/hr, cont to monitor intake output continue to hold lisinopril NSAIDs and nephrotoxic medication, dose medication renally, nephro closely consult Recent Labs    06/19/22 0623 06/20/22 0603 08/02/22 1736 08/03/22 0452 08/04/22 0412 10/13/22 0949 10/17/22 0101 12/21/22 1421 01/11/23 1005 01/17/23 1806 01/18/23 0415  BUN 10 8 24* 20 13 7 $ 32*  --  104* 124* 117*  CREATININE 0.66 0.68 1.50* 0.94 0.72 0.89 1.64* 1.10* 4.02* 8.19* 7.39*    Mild abdominal pain nausea vomiting/diarrhea suspect form gastroparesis: CT abdomen no acute finding LFTs lipase normal.  Continue symptomatic management.  Patient was recently treated with ciprofloxacin.  Hypotension Hypertension: Continue to hold antihypertensives aggressive IV fluid hydration at 150 cc/hr as above  Hyponatremia due to hypovolemia volume depletion continue aggressive IVF  Microcytic anemia hemoglobin 9 to 10 g range.  Appears to be about the baseline.  Advise Age-appropriate cancer screening and PCP follow-up. Denies any rectal of vaginal bleeding- had colo 6 yrs ago with 12 polyps- due for next soon.  Gastroparesis, diagnosed about 2 months ago by PCP: habing nauseas vomiting: cotn on symptomatic management.  Diabetes mellitus continue sliding scale insulin and monitor. Recent Labs  Lab 01/18/23 0138 01/18/23 0807  GLUCAP 80 82    HLD continue Lipitor.  DVT prophylaxis: heparin injection 5,000 Units Start: 01/18/23 0600 Code Status:   Code Status: Full Code Family Communication: plan of care discussed with patient at bedside. Patient status is: inpatient  because of AKI Level of care: Telemetry   Dispo: The patient is from: home alone            Anticipated disposition: TBD Objective: Vitals  last 24 hrs: Vitals:   01/18/23 0507 01/18/23 0530 01/18/23 0702 01/18/23 0745  BP: 112/60 (!) 104/54 (!) 87/57 (!) 95/59  Pulse: 69 68  66  Resp: 10 10 12 14  $ Temp:    97.8 F  (36.6 C)  TempSrc:    Oral  SpO2: 100% 99%  100%  Weight:      Height:       Weight change:   Physical Examination: General exam: alert awake, older than stated age HEENT:Oral mucosa moist, Ear/Nose WNL grossly Respiratory system: bilaterally CLEAR BS, no use of accessory muscle Cardiovascular system: S1 & S2 +, No JVD. Gastrointestinal system: Abdomen soft, mildly ender on mid abdomejn, ND, BS+ Nervous System:Alert, awake, moving extremities. Extremities: LE edema NEG,distal peripheral pulses palpable.  Skin: No rashes,no icterus. MSK: Normal muscle bulk,tone, power  Medications reviewed:  Scheduled Meds:  atorvastatin  10 mg Oral Daily   heparin  5,000 Units Subcutaneous Q8H   insulin aspart  0-5 Units Subcutaneous QHS   insulin aspart  0-6 Units Subcutaneous TID WC   sodium chloride flush  3 mL Intravenous Q12H   Continuous Infusions:  sodium chloride 125 mL/hr at 01/18/23 0142    Diet Order             Diet clear liquid Room service appropriate? Yes; Fluid consistency: Thin  Diet effective now                  Intake/Output Summary (Last 24 hours) at 01/18/2023 0840 Last data filed at 01/18/2023 0121 Gross per 24 hour  Intake 1000 ml  Output --  Net 1000 ml   Net IO Since Admission: 1,000 mL [01/18/23 0840]  Wt Readings from Last 3 Encounters:  01/17/23 83 kg  01/11/23 84.4 kg  12/22/22 90.7 kg     Unresulted Labs (From admission, onward)     Start     Ordered   01/18/23 [id number]  Basic metabolic panel  Daily,   R      01/18/23 0114   01/18/23 0500  CBC  Daily,   R      01/18/23 0114          Data Reviewed: I have personally reviewed following labs and imaging studies CBC: Recent Labs  Lab 01/11/23 1005 01/17/23 1806 01/18/23 0415  WBC 13.9* 10.0 8.8  NEUTROABS 9.7*  --   --   HGB 11.9 10.6* 9.7*  HCT 37.7 33.3* 30.4*  MCV 70* 69.8* 70.2*  PLT 320 251 0000000   Basic Metabolic Panel: Recent Labs  Lab 01/11/23 1005 01/17/23 1806  01/18/23 0415  NA 126* 128* 130*  K 3.8 4.1 3.8  CL 80* 89* 95*  CO2 20 22 21*  GLUCOSE 202* 118* 99  BUN 104* 124* 117*  CREATININE 4.02* 8.19* 7.39*  CALCIUM 10.5* 9.8 9.1   GFR: Estimated Creatinine Clearance: 9.1 mL/min (A) (by C-G formula based on SCr of 7.39 mg/dL (H)). Liver Function Tests: Recent Labs  Lab 01/11/23 1005 01/17/23 1806  AST 10 10*  ALT 12 13  ALKPHOS 75 49  BILITOT 0.6 0.5  PROT 8.0 8.1  ALBUMIN 4.5 4.1   Recent Labs  Lab 01/17/23 1806  LIPASE 51  CBG: Recent Labs  Lab 01/18/23 0138 01/18/23 0807  GLUCAP 80 82   Recent Labs  Lab 01/17/23 2323  LATICACIDVEN 1.1    No results found for this or any previous visit (from the past 240 hour(s)).  Antimicrobials: Anti-infectives (From admission,  onward)    None      Culture/Microbiology    Component Value Date/Time   SDES  08/02/2022 1748    URINE, CLEAN CATCH Performed at [facility], [address], [place], [place] [address]    [facility]  08/02/2022 1748    NONE Performed at [facility], [address] [place], [place] [address]    CULT (A) 08/02/2022 1748    <10,000 COLONIES/mL INSIGNIFICANT GROWTH Performed at [facility] [address] [place],  [address]    REPTSTATUS 08/03/2022 FINAL 08/02/2022 1748   Radiology Studies: CT ABDOMEN PELVIS WO CONTRAST  Result Date: 01/18/2023 CLINICAL DATA:  Abdominal pain EXAM: CT ABDOMEN AND PELVIS WITHOUT CONTRAST TECHNIQUE: Multidetector CT imaging of the abdomen and pelvis was performed following the standard protocol without IV contrast. RADIATION DOSE REDUCTION: This exam was performed according to the departmental dose-optimization program which includes automated exposure control, adjustment of the mA and/or kV according to patient size and/or use of iterative reconstruction technique. COMPARISON:  08/02/2022 FINDINGS: Lower Chest: Normal. Hepatobiliary: Normal hepatic contours. No intra- or extrahepatic biliary  dilatation. Status post cholecystectomy. Pancreas: Normal pancreas. No ductal dilatation or peripancreatic fluid collection. Spleen: Normal. Adrenals/Urinary Tract: The adrenal glands are normal. No hydronephrosis, nephroureterolithiasis or solid renal mass. The urinary bladder is normal for degree of distention Stomach/Bowel: There is no hiatal hernia. Normal duodenal course and caliber. No small bowel dilatation or inflammation. No focal colonic abnormality. Normal appendix. Vascular/Lymphatic: Normal course and caliber of the major abdominal vessels. No abdominal or pelvic lymphadenopathy. Reproductive: Status post hysterectomy. No adnexal mass. Other: None. Musculoskeletal: No bony spinal canal stenosis or focal osseous abnormality. IMPRESSION: No acute abnormality of the abdomen or pelvis. Electronically Signed   By: Ulyses Jarred M.D.   On: 01/18/2023 03:42     LOS: 0 days   Antonieta Pert, MD [facility]  01/18/2023, 8:40 AM

## 2023-01-18 NOTE — H&P (Signed)
History and Physical    NAJAY WHITINGER [id number] DOB: 1961-01-09 DOA: 01/17/2023  PCP: Alvira Monday, FNP   Patient coming from: Home  Chief Complaint: N/V/D, lab abnormalities   HPI: Tashae L Besch is a pleasant 62 y.o. female with medical history significant for hypertension, hyperlipidemia, and type 2 diabetes mellitus, now presenting to the emergency department with nausea, vomiting, diarrhea, and lab abnormalities.  Patient reports that she has been having 2-3 loose stools every day for at least 2 to 3 weeks now and has more recently developed nausea with nonbloody vomiting.  She was seen for this on 01/11/2023 and was treated with ciprofloxacin, but did not experience any improvement.  She followed up again today with primary care and was sent to the emergency department due to her ongoing symptoms and acute renal failure.  She complains of mild to moderate pain in the mid and lower abdomen.  She denies fever or chills.  ED Course: Upon arrival to the ED, patient is found to be afebrile and saturating well on room air with normal heart rate and systolic blood pressure of 94 and greater.  Blood work is most notable for sodium 128, BUN 124, creatinine 8.19, and hemoglobin 10.6.  Patient was given 1 L of normal saline in the ED.  Review of Systems:  All other systems reviewed and apart from HPI, are negative.  Past Medical History:  Diagnosis Date   Allergy 2006   Contrast dye   Anemia    Arthritis    Bartholin cyst 07/03/2014   Colitis 08/2015   [facility]   Diabetes mellitus    Hyperlipidemia LDL goal <70 04/09/2022   Hypertension    Labial abscess 08/2015   Status post I&D by Dr. Ladona Horns   Nicotine addiction 09/03/2013   Obesity    Pancreatitis 09/2015   unknown etiology, no further work-up with gastroenterology    Tuberculosis 1970   Vaginal itching 09/03/2013    Past Surgical History:  Procedure Laterality Date   ABDOMINAL HYSTERECTOMY      ABDOMINAL SURGERY     exploratry with TAH   BALLOON DILATION N/A 10/26/2016   Procedure: Pyloric channel BALLOON DILATION;  Surgeon: Daneil Dolin, MD;  Location: [facility];  Service: Endoscopy;  Laterality: N/A;   BARTHOLIN GLAND CYST EXCISION Right 08/20/2014   Procedure: EXCISION OF RIGHT BARTHOLIN'S GLAND CYST;  Surgeon: Florian Buff, MD;  Location: [facility] OR[facility];  Service: Gynecology;  Laterality: Right;   CHOLECYSTECTOMY     COLONOSCOPY  10/2015   Doristine Mango: mild diverticulosis, at least 8 polpys removed, multiple tubular adenomas. next tcs in 1 year   ESOPHAGOGASTRODUODENOSCOPY N/A 10/26/2016   Procedure: ESOPHAGOGASTRODUODENOSCOPY (EGD);  Surgeon: Daneil Dolin, MD;  Location: [facility];  Service: Endoscopy;  Laterality: N/A;   FOOT SURGERY     FOOT SURGERY Right 2011   shave bone   TONSILLECTOMY     TUBAL LIGATION  1990    Social History:   reports that she quit smoking about 6 years ago. Her smoking use included cigarettes. She has a 0.75 pack-year smoking history. She has never used smokeless tobacco. She reports that she does not currently use drugs after having used the following drugs: Marijuana. She reports that she does not drink alcohol.  Allergies  Allergen Reactions   Iohexol Swelling     Code: HIVES, Desc: PT STATES THE EYES SWOLL AND ITCHED, NEEDS PRE MEDS     Family History  Problem Relation Age of Onset   Diabetes Mother    Stroke Mother    Cancer Father        liver and colon. Patient unsure primary    Stomach cancer Father    Diabetes Sister    Stomach cancer Sister    Liver cancer Sister    Bone cancer Sister    Diabetes Brother    Lung cancer Brother    Cancer Brother    Heart disease Son    Early death Son    Breast cancer Neg Hx      Prior to Admission medications   Medication Sig Start Date End Date Taking? Authorizing Provider  acetaminophen (TYLENOL) 325 MG tablet Take 650 mg by mouth every 6 (six) hours as needed for  moderate pain.   Yes [provider]  atorvastatin (LIPITOR) 10 MG tablet Take 1 tablet (10 mg total) by mouth daily. 02/18/22  Yes Paseda, Dewaine Conger, FNP  dapagliflozin propanediol (FARXIGA) 5 MG TABS tablet TAKE 1 TABLET(5 MG) BY MOUTH DAILY BEFORE BREAKFAST 01/11/23  Yes Alvira Monday, FNP  diclofenac Sodium (VOLTAREN) 1 % GEL Apply 2 g topically daily as needed (pain).   Yes [provider]  gabapentin (NEURONTIN) 300 MG capsule TAKE 1 CAPSULE(300 MG) BY MOUTH THREE TIMES DAILY Patient taking differently: Take 300 mg by mouth as needed (pain). 11/29/22  Yes Standiford, Nena Alexander, DPM  ibuprofen (ADVIL) 800 MG tablet Take 1 tablet (800 mg total) by mouth every 8 (eight) hours as needed. 01/11/23  Yes Alvira Monday, FNP  lisinopril (ZESTRIL) 20 MG tablet Take 1 tablet (20 mg total) by mouth daily. 02/17/22  Yes Paseda, Dewaine Conger, FNP  loperamide (IMODIUM A-D) 2 MG tablet Take 1 tablet (2 mg total) by mouth 4 (four) times daily as needed for diarrhea or loose stools. 01/11/23  Yes Alvira Monday, FNP  metFORMIN (GLUCOPHAGE) 1000 MG tablet Take 1 tablet (1,000 mg total) by mouth 2 (two) times daily with a meal. 01/11/23  Yes Alvira Monday, FNP  metoCLOPramide (REGLAN) 5 MG tablet Take 1 tablet (5 mg total) by mouth 4 (four) times daily. 01/11/23  Yes Alvira Monday, FNP  urea (CARMOL) 10 % cream Apply topically as needed. 12/15/22  Yes Standiford, Nena Alexander, DPM  Vitamin D, Ergocalciferol, (DRISDOL) 1.25 MG (50000 UNIT) CAPS capsule Take 1 capsule (50,000 Units total) by mouth every 7 (seven) days. 10/19/22  Yes Alvira Monday, FNP  amLODipine (NORVASC) 5 MG tablet Take 1 tablet (5 mg total) by mouth daily. 06/20/22 10/25/23  Deatra James, MD  diphenhydrAMINE (BENADRYL) 50 MG capsule Take 50 mg by mouth 1 hour prior to your procedure. 12/13/22   Angelia Mould, MD  Lancets [facility] ULTRASOFT) lancets Use as instructed 04/13/22   Renee Rival, FNP  [facility]  VERIO test strip once daily testing dx e11.65 10/24/22   Alvira Monday, FNP  predniSONE (DELTASONE) 50 MG tablet One tablet (42m) 13 hours prior to procedure; one tablet (541m 7 hours prior to procedure and then one tablet (50 mg) one hour prior to procedure. 12/19/22   DiAngelia MouldMD  UNABLE TO FIND Blood glucose monitor kit check blood glucose twice daily dx: e11.65 04/12/22   PaRenee RivalFNP  UNABLE TO FIND Glucose test strips check blood glucose twice daily dx e11.65 04/12/22   PaRenee RivalFNP    Physical Exam: Vitals:   01/17/23 1630 01/17/23 1635 01/17/23 2135  BP:  94/61 (!) 102/59  Pulse:  88 75  Resp:  18 16  Temp:  97.7 F (36.5 C)   TempSrc:  Oral   SpO2:  100% 100%  Weight: 83 kg    Height: 5' 7"$  (1.702 m)      Constitutional: NAD, calm  Eyes: PERTLA, lids and conjunctivae normal ENMT: Mucous membranes are moist. Posterior pharynx clear of any exudate or lesions.   Neck: supple, no masses  Respiratory:  no wheezing, no crackles. No accessory muscle use.  Cardiovascular: S1 & S2 heard, regular rate and rhythm. No JVD. Abdomen: No distension, soft, tender in lower abdomen without rebound pain or guarding. Bowel sounds active.  Musculoskeletal: no clubbing / cyanosis. No joint deformity upper and lower extremities.   Skin: no significant rashes, lesions, ulcers. Warm, dry, well-perfused. Neurologic: CN 2-12 grossly intact. Moving all extremities. Alert and oriented.  Psychiatric: Pleasant. Cooperative.    Labs and Imaging on Admission: I have personally reviewed following labs and imaging studies  CBC: Recent Labs  Lab 01/11/23 1005 01/17/23 1806  WBC 13.9* 10.0  NEUTROABS 9.7*  --   HGB 11.9 10.6*  HCT 37.7 33.3*  MCV 70* 69.8*  PLT 320 [id number]   Basic Metabolic Panel: Recent Labs  Lab 01/11/23 1005 01/17/23 1806  NA 126* 128*  K 3.8 4.1  CL 80* 89*  CO2 20 22  GLUCOSE 202* 118*  BUN 104* 124*  CREATININE 4.02* 8.19*   CALCIUM 10.5* 9.8   GFR: Estimated Creatinine Clearance: 8.2 mL/min (A) (by C-G formula based on SCr of 8.19 mg/dL (H)). Liver Function Tests: Recent Labs  Lab 01/11/23 1005 01/17/23 1806  AST 10 10*  ALT 12 13  ALKPHOS 75 49  BILITOT 0.6 0.5  PROT 8.0 8.1  ALBUMIN 4.5 4.1   Recent Labs  Lab 01/17/23 1806  LIPASE 51   No results for input(s): "AMMONIA" in the last 168 hours. Coagulation Profile: No results for input(s): "INR", "PROTIME" in the last 168 hours. Cardiac Enzymes: No results for input(s): "CKTOTAL", "CKMB", "CKMBINDEX", "TROPONINI" in the last 168 hours. BNP (last 3 results) No results for input(s): "PROBNP" in the last 8760 hours. HbA1C: No results for input(s): "HGBA1C" in the last 72 hours. CBG: No results for input(s): "GLUCAP" in the last 168 hours. Lipid Profile: No results for input(s): "CHOL", "HDL", "LDLCALC", "TRIG", "CHOLHDL", "LDLDIRECT" in the last 72 hours. Thyroid Function Tests: No results for input(s): "TSH", "T4TOTAL", "FREET4", "T3FREE", "THYROIDAB" in the last 72 hours. Anemia Panel: No results for input(s): "VITAMINB12", "FOLATE", "FERRITIN", "TIBC", "IRON", "RETICCTPCT" in the last 72 hours. Urine analysis:    Component Value Date/Time   COLORURINE AMBER (A) 10/17/2022 0120   APPEARANCEUR CLOUDY (A) 10/17/2022 0120   LABSPEC 1.032 (H) 10/17/2022 0120   PHURINE 5.0 10/17/2022 0120   GLUCOSEU >=500 (A) 10/17/2022 0120   HGBUR NEGATIVE 10/17/2022 0120   BILIRUBINUR SMALL (A) 10/17/2022 0120   KETONESUR 5 (A) 10/17/2022 0120   PROTEINUR >=300 (A) 10/17/2022 0120   UROBILINOGEN 0.2 10/10/2015 1446   NITRITE NEGATIVE 10/17/2022 0120   LEUKOCYTESUR NEGATIVE 10/17/2022 0120   Sepsis Labs: @LABRCNTIP$ (procalcitonin:4,lacticidven:4) )No results found for this or any previous visit (from the past 240 hour(s)).   Radiological Exams on Admission: No results found.   Assessment/Plan   1. AKI  - BUN is 124 and SCr 8.19 on admission;  there were normal in November 2023   - Likely acute prerenal azotemia in setting of N/V/D as well as ACE-inhibition  - She was  given 1 liter NS in ED  - Hold lisinopril, metformin, and Farxiga, check urinalysis with microscopy, renally-dose medications, continue IVF hydration, repeat chem panel in am    2. Abdominal pain with N/V/D  - Check CT, continue IVF hydration, monitor electrolytes, advance diet as she improves    3. Type II DM  - Hold metformin and Farxiga, check CBGs, and use low-intensity SSI for now    4. Hypertension  - BP low-normal in ED and antihypertensives held initially   5. Hyponatremia  - Serum sodium is 128 in setting of hypovolemia  - Continue isotonic IVF hydration and repeat chem panel in am    DVT prophylaxis: sq heparin  Code Status: Full  Level of Care: Level of care: Telemetry Family Communication: none present  Disposition Plan:  Patient is from: home  Anticipated d/c is to: TBD Anticipated d/c date is: 01/22/23  Patient currently: Pending improved/stable renal function, tolerance of adequate oral intake  Consults called: none  Admission status: Inpatient     Vianne Bulls, MD [facility]  01/18/2023, 1:14 AM

## 2023-01-18 NOTE — Hospital Course (Addendum)
44 yof w/ hypertension on lisinopril/amlodipine, hyperlipidemia, and type 2 diabetes mellitus on metformin/Farxiga/Trulicity presented with loose stool 2-3/day x 2 to 3 weeks and recently with nausea, nonbloody vomiting, seen on 01/11/2023 and treated with ciprofloxacin presenting with worsening of her symptoms and with abnormal lab of elevated creatinine at PCP office.  Patient complaining of mild to moderate pain in the mid and lower abdomen no fever or chills. In the ED BP soft 90s to 100, afebrile.  Labs shows hyponatremia 128 elevated BUN/creatinine 124/8.1, was 104/4.1 on 2/14 baseline 1.1 in Jan/24/24, potassium 4.1 bicarb 22 anion gap 17, normal LFTs, microcytic anemia hemoglobin 10.6 g, UA with WBC 11-20, RBC 0-5 bacteria many, leukocyte UA trace protein negative, protein/creatinine 0.17.  CT abdomen pelvis without contrast no acute finding, no hydronephrosis/nephroureterolithiasis or solid renal mass.  1 L bolus normal saline given, patient was admitted for further management.  Given further aggressive IV fluid hydration, seen by nephrology her NSAIDs lisinopril/ Her AKI has nicely improved 1.6, at this time tolerating diet we will discharge her home advised to hold metformin, lisinopril for seizure ibuprofen and follow-up with nephrology as outpatient cleared for discharge by nephrology.

## 2023-01-18 NOTE — ED Notes (Signed)
Patient ambulated to bathroom with x1 assist.

## 2023-01-18 NOTE — TOC Initial Note (Signed)
Transition of Care [facility]) - Initial/Assessment Note    Patient Details  Name: Shawna Huerta MRN: [id number] Date of Birth: 02-Oct-1961  [facility] CM/SW Contact:    Iona Beard, [place] Phone Number: 01/18/2023, 1:41 PM  Clinical Narrative:                 Pt is high risk for readmission. CSW spoke with pt to complete assessment. Pt states that she lives alone. Pt is independent in completing ADLs and able to drive to appointments when needed. Pt states she has not had HH in the past. Pt does not use any DME in the home. TOC to follow for needs.   Expected Discharge Plan: Home/Self Care Barriers to Discharge: Continued Medical Work up   Patient Goals and CMS Choice Patient states their goals for this hospitalization and ongoing recovery are:: return home CMS Medicare.gov Compare Post Acute Care list provided to:: Patient Choice offered to / list presented to : Patient      Expected Discharge Plan and Services In-house Referral: Clinical Social Work Discharge Planning Services: CM Consult   Living arrangements for the past 2 months: Single Family Home                                      Prior Living Arrangements/Services Living arrangements for the past 2 months: Single Family Home Lives with:: Self Patient language and need for interpreter reviewed:: Yes Do you feel safe going back to the place where you live?: Yes      Need for Family Participation in Patient Care: Yes (Comment) Care giver support system in place?: Yes (comment)   Criminal Activity/Legal Involvement Pertinent to Current Situation/Hospitalization: No - Comment as needed  Activities of Daily Living Home Assistive Devices/Equipment: None ADL Screening (condition at time of admission) Patient's cognitive ability adequate to safely complete daily activities?: Yes Is the patient deaf or have difficulty hearing?: No Does the patient have difficulty seeing, even when wearing  glasses/contacts?: No Does the patient have difficulty concentrating, remembering, or making decisions?: No Patient able to express need for assistance with ADLs?: Yes Does the patient have difficulty dressing or bathing?: No Independently performs ADLs?: Yes (appropriate for developmental age) Does the patient have difficulty walking or climbing stairs?: No Weakness of Legs: Both Weakness of Arms/Hands: Both  Permission Sought/Granted                  Emotional Assessment Appearance:: Appears stated age Attitude/Demeanor/Rapport: Engaged Affect (typically observed): Accepting Orientation: : Oriented to Self, Oriented to Place, Oriented to  Time, Oriented to Situation Alcohol / Substance Use: Not Applicable Psych Involvement: No (comment)  Admission diagnosis:  Dehydration [E86.0] Acute renal failure (ARF) (HCC) [N17.9] Acute renal failure, unspecified acute renal failure type [facility]) [N17.9] Patient Active Problem List   Diagnosis Date Noted   Acute renal failure (ARF) ([place]) 01/18/2023   Foot pain, left 08/11/2022   Hospital discharge follow-up 08/11/2022   Bacterial gastroenteritis 08/03/2022   Hypokalemia 08/03/2022   Hypertensive urgency 08/03/2022   Dehydration 06/17/2022   SIRS (systemic inflammatory response syndrome) ([place]) 06/17/2022   Abnormal serum level of lipase 06/17/2022   Sepsis ([place]) 06/17/2022    Class: Acute   Hyperlipidemia LDL goal <70 04/09/2022   Microcytic anemia 02/17/2022   Need for immunization against influenza 02/17/2022   Left breast mass 02/17/2022   Marijuana smoker 02/17/2022  Left leg DVT ([place]) 11/12/2021   Abdominal pain 07/05/2019   Enteritis 07/05/2019   Colon adenomas 12/28/2016   Normocytic anemia 12/28/2016   Gastroparesis    Type 2 diabetes mellitus ([place]) 10/23/2016   AKI (acute kidney injury) ([place]) 10/23/2016   IBS (irritable bowel syndrome) 10/12/2015   Acute pancreatitis 10/10/2015   Vomiting and diarrhea 10/10/2015    Nausea & vomiting 10/10/2015   Type 2 diabetes mellitus with other specified complication ([place]) [id number]   Labial abscess 10/10/2015   Hyponatremia 10/10/2015   Essential hypertension 10/10/2015   Tobacco abuse 10/10/2015   Bartholin cyst 07/03/2014   Uncontrolled type 2 diabetes mellitus with hyperglycemia, without long-term current use of insulin ([place]) 09/03/2013   Obesity 09/03/2013   Vaginal itching 09/03/2013   Nicotine addiction 09/03/2013   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   TENOSYNOVITIS OF FOOT AND ANKLE 09/22/2010   PCP:  Alvira Monday, FNP Pharmacy:   [id number] [id number]: [phone]: [phone]    Social Determinants of Health (SDOH) Social History: SBaring No Food Insecurity (10/24/2022)  Housing: Low Risk  (10/24/2022)  Transportation Needs: No Transportation Needs (08/03/2022)  Utilities: Not At Risk (10/24/2022)  Depression (PHQ2-9): Low Risk  (01/17/2023)  Financial Resource Strain: Low Risk  (10/24/2022)  Physical Activity: Inactive (10/24/2022)  Social Connections: Moderately Isolated (10/24/2022)  Stress: Unknown (07/07/2019)  Tobacco Use: Medium Risk (01/18/2023)   SDOH Interventions:     Readmission Risk Interventions    01/18/2023    1:40 PM 08/04/2022    2:20 PM  Readmission Risk Prevention Plan  Transportation Screening Complete Complete  HRI or Home Care Consult Complete Complete  Social Work Consult for [place] Complete Complete  Palliative Care Screening Not Applicable Not Applicable  Medication Review (Press photographer Complete Complete

## 2023-01-18 NOTE — Consult Note (Signed)
Reason for Consult: AKI Referring Physician: Lupita Leash, MD  Shawna Huerta is an 62 y.o. female with a PMH signficant for DM type 2, HTN, gastroparesis, h/o pancreatitis, and HLD who presented to [facility] ED on 01/17/23 after being instructed by her PCP due to abnormal labs.  She has been having ongoing N/V/D and abdominal pain for the past month without improvement.  Her PCP decreased metformin to 500 mg bid and started on Farxiga 5 mg daily on 01/11/23.  Labs from that day were notable for Scr of 4.02.    In the ED, T 97.7, P88, BP 94/61, SpO2 100%.  Labs were notable for Na 128, Cl 89, BUN 124, Cr 8.19, Hgb 10.6.  She was given IV fluids and admitted for further evaluation.  We were consulted to further evaluate and manage her AKI.  The trend in Scr is seen below.  Of note, she had CT angio of her chest on 12/21/22 to r/o atheroembolic source of left 4th toe gangrene.  The study was negative.  Also of note she had been prescribed ibuprofen a week before admission and has been taking lisinopril throughout the episodes of N/V/D.  Trend in Creatinine: Creatinine, Ser  Date/Time Value Ref Range Status  01/18/2023 04:15 AM 7.39 (H) 0.44 - 1.00 mg/dL Final  01/17/2023 06:06 PM 8.19 (H) 0.44 - 1.00 mg/dL Final  01/11/2023 10:05 AM 4.02 (H) 0.57 - 1.00 mg/dL Final  12/21/2022 02:21 PM 1.10 (H) 0.44 - 1.00 mg/dL Final  10/17/2022 01:01 AM 1.64 (H) 0.44 - 1.00 mg/dL Final  10/13/2022 09:49 AM 0.89 0.57 - 1.00 mg/dL Final  08/04/2022 04:12 AM 0.72 0.44 - 1.00 mg/dL Final  08/03/2022 04:52 AM 0.94 0.44 - 1.00 mg/dL Final  08/02/2022 05:36 PM 1.50 (H) 0.44 - 1.00 mg/dL Final  06/20/2022 06:03 AM 0.68 0.44 - 1.00 mg/dL Final  06/19/2022 06:23 AM 0.66 0.44 - 1.00 mg/dL Final  06/18/2022 04:40 AM 0.79 0.44 - 1.00 mg/dL Final  06/17/2022 05:45 AM 1.11 (H) 0.44 - 1.00 mg/dL Final  06/16/2022 10:05 PM 1.54 (H) 0.44 - 1.00 mg/dL Final  05/01/2022 05:33 PM 0.95 0.44 - 1.00 mg/dL Final  03/03/2022 10:03 AM 0.80 0.57 - 1.00  mg/dL Final  02/17/2022 09:11 AM 0.66 0.57 - 1.00 mg/dL Final  10/31/2021 02:05 PM 0.60 0.44 - 1.00 mg/dL Final  07/07/2019 06:18 AM 1.08 (H) 0.44 - 1.00 mg/dL Final  07/06/2019 05:58 AM 2.08 (H) 0.44 - 1.00 mg/dL Final  07/05/2019 06:07 AM 3.07 (H) 0.44 - 1.00 mg/dL Final  10/25/2016 04:36 AM 0.73 0.44 - 1.00 mg/dL Final  10/24/2016 04:51 AM 1.15 (H) 0.44 - 1.00 mg/dL Final  10/23/2016 06:39 PM 1.80 (H) 0.44 - 1.00 mg/dL Final  10/19/2016 10:08 PM 0.78 0.44 - 1.00 mg/dL Final  10/12/2015 06:05 AM 0.74 0.44 - 1.00 mg/dL Final  10/11/2015 05:55 AM 0.77 0.44 - 1.00 mg/dL Final  10/10/2015 01:46 PM 0.83 0.44 - 1.00 mg/dL Final  04/14/2015 03:45 PM 0.90 0.44 - 1.00 mg/dL Final  08/18/2014 08:10 AM 0.61 0.50 - 1.10 mg/dL Final  08/05/2013 09:14 PM 0.70 0.50 - 1.10 mg/dL Final  03/07/2012 10:23 PM 0.63 0.50 - 1.10 mg/dL Final  04/05/2011 03:00 PM 0.63 0.4 - 1.2 mg/dL Final    PMH:   Past Medical History:  Diagnosis Date   Allergy 2006   Contrast dye   Anemia    Arthritis    Bartholin cyst 07/03/2014   Colitis 08/2015   Treated at [facility]  Diabetes mellitus    Hyperlipidemia LDL goal <70 04/09/2022   Hypertension    Labial abscess 08/2015   Status post I&D by Dr. Ladona Horns   Nicotine addiction 09/03/2013   Obesity    Pancreatitis 09/2015   unknown etiology, no further work-up with gastroenterology    Tuberculosis 1970   Vaginal itching 09/03/2013    PSH:   Past Surgical History:  Procedure Laterality Date   ABDOMINAL HYSTERECTOMY     ABDOMINAL SURGERY     exploratry with TAH   BALLOON DILATION N/A 10/26/2016   Procedure: Pyloric channel BALLOON DILATION;  Surgeon: Daneil Dolin, MD;  Location: [facility] ENDO [facility];  Service: Endoscopy;  Laterality: N/A;   BARTHOLIN GLAND CYST EXCISION Right 08/20/2014   Procedure: EXCISION OF RIGHT BARTHOLIN'S GLAND CYST;  Surgeon: Florian Buff, MD;  Location: [facility] ORS;  Service: Gynecology;  Laterality: Right;   CHOLECYSTECTOMY      COLONOSCOPY  10/2015   Doristine Mango: mild diverticulosis, at least 8 polpys removed, multiple tubular adenomas. next tcs in 1 year   ESOPHAGOGASTRODUODENOSCOPY N/A 10/26/2016   Procedure: ESOPHAGOGASTRODUODENOSCOPY (EGD);  Surgeon: Daneil Dolin, MD;  Location: [facility] END[facility] SU[facility];  Service: Endoscopy;  Laterality: N/A;   FOOT SURGERY     FOOT SURGERY Right 2011   shave bone   TONSILLECTOMY     TUBAL LIGATION  1990    Allergies:  Allergies  Allergen Reactions   Iohexol Swelling     Code: HIVES, Desc: PT STATES THE EYES SWOLL AND ITCHED, NEEDS PRE MEDS     Medications:   Prior to Admission medications   Medication Sig Start Date End Date Taking? Authorizing Provider  acetaminophen (TYLENOL) 325 MG tablet Take 650 mg by mouth every 6 (six) hours as needed for moderate pain.   Yes [provider]  atorvastatin (LIPITOR) 10 MG tablet Take 1 tablet (10 mg total) by mouth daily. 02/18/22  Yes Paseda, Dewaine Conger, FNP  diclofenac Sodium (VOLTAREN) 1 % GEL Apply 2 g topically daily as needed (pain).   Yes [provider]  gabapentin (NEURONTIN) 300 MG capsule TAKE 1 CAPSULE(300 MG) BY MOUTH THREE TIMES DAILY Patient taking differently: Take 300 mg by mouth as needed (pain). 11/29/22  Yes Standiford, Nena Alexander, DPM  ibuprofen (ADVIL) 800 MG tablet Take 1 tablet (800 mg total) by mouth every 8 (eight) hours as needed. 01/11/23  Yes Alvira Monday, FNP  lisinopril (ZESTRIL) 20 MG tablet Take 1 tablet (20 mg total) by mouth daily. 02/17/22  Yes Paseda, Dewaine Conger, FNP  loperamide (IMODIUM A-D) 2 MG tablet Take 1 tablet (2 mg total) by mouth 4 (four) times daily as needed for diarrhea or loose stools. 01/11/23  Yes Alvira Monday, FNP  metFORMIN (GLUCOPHAGE) 1000 MG tablet Take 1 tablet (1,000 mg total) by mouth 2 (two) times daily with a meal. 01/11/23  Yes Alvira Monday, FNP  metoCLOPramide (REGLAN) 5 MG tablet Take 1 tablet (5 mg total) by mouth 4 (four) times daily. 01/11/23   Yes Alvira Monday, FNP  urea (CARMOL) 10 % cream Apply topically as needed. 12/15/22  Yes Standiford, Nena Alexander, DPM  Vitamin D, Ergocalciferol, (DRISDOL) 1.25 MG (50000 UNIT) CAPS capsule Take 1 capsule (50,000 Units total) by mouth every 7 (seven) days. 10/19/22  Yes Alvira Monday, FNP  amLODipine (NORVASC) 5 MG tablet Take 1 tablet (5 mg total) by mouth daily. 06/20/22 10/25/23  Deatra James, MD  dapagliflozin propanediol (FARXIGA) 10 MG TABS tablet Take 1  tablet (10 mg total) by mouth daily before breakfast. 01/18/23   Alvira Monday, FNP  diphenhydrAMINE (BENADRYL) 50 MG capsule Take 50 mg by mouth 1 hour prior to your procedure. 12/13/22   Angelia Mould, MD  Dulaglutide (TRULICITY) [id number] SOPN Inject 0.75 mg into the skin once a week. 01/18/23   Alvira Monday, FNP  Lancets [facility] ULTRASOFT) lancets Use as instructed 04/13/22   Renee Rival, FNP  [facility] VERIO test strip once daily testing dx e11.65 10/24/22   Alvira Monday, FNP  predniSONE (DELTASONE) 50 MG tablet One tablet (83m) 13 hours prior to procedure; one tablet (524m 7 hours prior to procedure and then one tablet (50 mg) one hour prior to procedure. 12/19/22   DiAngelia MouldMD  UNABLE TO FIND Blood glucose monitor kit check blood glucose twice daily dx: e11.65 04/12/22   PaRenee RivalFNP  UNABLE TO FIND Glucose test strips check blood glucose twice daily dx e11.65 04/12/22   PaRenee RivalFNP    Inpatient medications:  atorvastatin  10 mg Oral Daily   heparin  5,000 Units Subcutaneous Q8H   insulin aspart  0-5 Units Subcutaneous QHS   insulin aspart  0-6 Units Subcutaneous TID WC   sodium chloride flush  3 mL Intravenous Q12H    Discontinued Meds:   Medications Discontinued During This Encounter  Medication Reason   0.9 %  sodium chloride infusion     Social History:  reports that she quit smoking about 6 years ago. Her smoking use included cigarettes. She has a  0.75 pack-year smoking history. She has never used smokeless tobacco. She reports that she does not currently use drugs after having used the following drugs: Marijuana. She reports that she does not drink alcohol.  Family History:   Family History  Problem Relation Age of Onset   Diabetes Mother    Stroke Mother    Cancer Father        liver and colon. Patient unsure primary    Stomach cancer Father    Diabetes Sister    Stomach cancer Sister    Liver cancer Sister    Bone cancer Sister    Diabetes Brother    Lung cancer Brother    Cancer Brother    Heart disease Son    Early death Son    Breast cancer Neg Hx     Pertinent items are noted in HPI. Weight change:   Intake/Output Summary (Last 24 hours) at 01/18/2023 1142 Last data filed at 01/18/2023 1024 Gross per 24 hour  Intake 2087.5 ml  Output --  Net 2087.5 ml   BP (!) 95/59 (BP Location: Right Arm)   Pulse 66   Temp 97.8 F (36.6 C) (Oral)   Resp 14   Ht 5' 7"$  (1.702 m)   Wt 83 kg   SpO2 100%   BMI 28.66 kg/m  Vitals:   01/18/23 0507 01/18/23 0530 01/18/23 0702 01/18/23 0745  BP: 112/60 (!) 104/54 (!) 87/57 (!) 95/59  Pulse: 69 68  66  Resp: 10 10 12 14  $ Temp:    97.8 F (36.6 C)  TempSrc:    Oral  SpO2: 100% 99%  100%  Weight:      Height:         General appearance: fatigued and no distress Head: Normocephalic, without obvious abnormality, atraumatic Resp: clear to auscultation bilaterally Cardio: regular rate and rhythm, S1, S2 normal, no murmur, click, rub or gallop GI: +  BS, soft, mildly tender to palpation worse in suprapubic area Extremities: extremities normal, atraumatic, no cyanosis or edema  Labs: Basic Metabolic Panel: Recent Labs  Lab 01/17/23 1806 01/18/23 0415  NA 128* 130*  K 4.1 3.8  CL 89* 95*  CO2 22 21*  GLUCOSE 118* 99  BUN 124* 117*  CREATININE 8.19* 7.39*  ALBUMIN 4.1  --   CALCIUM 9.8 9.1   Liver Function Tests: Recent Labs  Lab 01/17/23 1806  AST 10*  ALT 13   ALKPHOS 49  BILITOT 0.5  PROT 8.1  ALBUMIN 4.1   Recent Labs  Lab 01/17/23 1806  LIPASE 51   No results for input(s): "AMMONIA" in the last 168 hours. CBC: Recent Labs  Lab 01/17/23 1806 01/18/23 0415  WBC 10.0 8.8  HGB 10.6* 9.7*  HCT 33.3* 30.4*  MCV 69.8* 70.2*  PLT 251 204   PT/INR: @LABRCNTIP$ (inr:5) Cardiac Enzymes: )No results for input(s): "CKTOTAL", "CKMB", "CKMBINDEX", "TROPONINI" in the last 168 hours. CBG: Recent Labs  Lab 01/18/23 0138 01/18/23 0807  GLUCAP 80 82    Iron Studies: No results for input(s): "IRON", "TIBC", "TRANSFERRIN", "FERRITIN" in the last 168 hours.  Xrays/Other Studies: CT ABDOMEN PELVIS WO CONTRAST  Result Date: 01/18/2023 CLINICAL DATA:  Abdominal pain EXAM: CT ABDOMEN AND PELVIS WITHOUT CONTRAST TECHNIQUE: Multidetector CT imaging of the abdomen and pelvis was performed following the standard protocol without IV contrast. RADIATION DOSE REDUCTION: This exam was performed according to the departmental dose-optimization program which includes automated exposure control, adjustment of the mA and/or kV according to patient size and/or use of iterative reconstruction technique. COMPARISON:  08/02/2022 FINDINGS: Lower Chest: Normal. Hepatobiliary: Normal hepatic contours. No intra- or extrahepatic biliary dilatation. Status post cholecystectomy. Pancreas: Normal pancreas. No ductal dilatation or peripancreatic fluid collection. Spleen: Normal. Adrenals/Urinary Tract: The adrenal glands are normal. No hydronephrosis, nephroureterolithiasis or solid renal mass. The urinary bladder is normal for degree of distention Stomach/Bowel: There is no hiatal hernia. Normal duodenal course and caliber. No small bowel dilatation or inflammation. No focal colonic abnormality. Normal appendix. Vascular/Lymphatic: Normal course and caliber of the major abdominal vessels. No abdominal or pelvic lymphadenopathy. Reproductive: Status post hysterectomy. No adnexal  mass. Other: None. Musculoskeletal: No bony spinal canal stenosis or focal osseous abnormality. IMPRESSION: No acute abnormality of the abdomen or pelvis. Electronically Signed   By: Ulyses Jarred M.D.   On: 01/18/2023 03:42     Assessment/Plan:  AKI - Appears to have started earlier in the month with Cr 4 on 01/11/23 and was 1.10 on 12/21/22.  Etiology is presumably combination of volume depletion, IV contrast, NSAID use, and concomitant ACE inhibition and SGLT-2 inhibitor.  BUN/Cr improving with IVF"s.  Continue to hold metformin, lisinopril, Farxiga, and ibuprofen.  Continue with IVF's.  No indication for dialysis at this time.  Will continue to follow UOP and daily SCr.  Avoid nephrotoxic medications including NSAIDs and iodinated intravenous contrast exposure unless the latter is absolutely indicated.   Preferred narcotic agents for pain control are hydromorphone, fentanyl, and methadone. Morphine should not be used.  Avoid Baclofen and avoid oral sodium phosphate and magnesium citrate based laxatives / bowel preps.  Continue strict Input and Output monitoring.  Will monitor the patient closely with you and intervene or adjust therapy as indicated by changes in clinical status/labs  Hyponatremia - due to volume depletion.  Improving with IVF's. Abdominal pain with N/V/D - CT scan without evidence of acute abnormality.  N/V improved but still with  some diarrhea and abdominal pain.  Plan per primary.  Would send GI panel on stool and urine culture. DM type II - Metformin and farxiga on hold.  Plan per primary svc. HTN - low bp, hold meds for now.   Governor Rooks Anneta Rounds 01/18/2023, 11:42 AM

## 2023-01-19 DIAGNOSIS — N179 Acute kidney failure, unspecified: Secondary | ICD-10-CM | POA: Diagnosis not present

## 2023-01-19 LAB — GLUCOSE, CAPILLARY
Glucose-Capillary: 111 mg/dL — ABNORMAL HIGH (ref 70–99)
Glucose-Capillary: 134 mg/dL — ABNORMAL HIGH (ref 70–99)
Glucose-Capillary: 149 mg/dL — ABNORMAL HIGH (ref 70–99)
Glucose-Capillary: 125 mg/dL — ABNORMAL HIGH (ref 70–99)

## 2023-01-19 LAB — CBC
HCT: 25.7 % — ABNORMAL LOW (ref 36.0–46.0)
Hemoglobin: 8.3 g/dL — ABNORMAL LOW (ref 12.0–15.0)
MCH: 22.7 pg — ABNORMAL LOW (ref 26.0–34.0)
MCHC: 32.3 g/dL (ref 30.0–36.0)
MCV: 70.2 fL — ABNORMAL LOW (ref 80.0–100.0)
Platelets: 171 10*3/uL (ref 150–400)
RBC: 3.66 MIL/uL — ABNORMAL LOW (ref 3.87–5.11)
RDW: 16.3 % — ABNORMAL HIGH (ref 11.5–15.5)
WBC: 6.2 10*3/uL (ref 4.0–10.5)
nRBC: 0 % (ref 0.0–0.2)

## 2023-01-19 LAB — BASIC METABOLIC PANEL
Anion gap: 8 (ref 5–15)
BUN: 86 mg/dL — ABNORMAL HIGH (ref 6–20)
CO2: 21 mmol/L — ABNORMAL LOW (ref 22–32)
Calcium: 8.4 mg/dL — ABNORMAL LOW (ref 8.9–10.3)
Chloride: 106 mmol/L (ref 98–111)
Creatinine, Ser: 3.64 mg/dL — ABNORMAL HIGH (ref 0.44–1.00)
GFR, Estimated: 14 mL/min — ABNORMAL LOW (ref >60)
Glucose, Bld: 131 mg/dL — ABNORMAL HIGH (ref 70–99)
Potassium: 3.6 mmol/L (ref 3.5–5.1)
Sodium: 135 mmol/L (ref 135–145)

## 2023-01-19 MED ORDER — GLUCERNA SHAKE PO LIQD
237.0000 mL | Freq: Two times a day (BID) | ORAL | Status: DC
  Administered 2023-01-19 – 2023-01-20 (×2): 237 mL via ORAL

## 2023-01-19 MED ORDER — ADULT MULTIVITAMIN W/MINERALS CH
1.0000 | ORAL_TABLET | Freq: Every day | ORAL | Status: DC
  Administered 2023-01-19 – 2023-01-20 (×2): 1 via ORAL
  Filled 2023-01-19 (×2): qty 1

## 2023-01-19 NOTE — Plan of Care (Signed)
  Problem: Nutrition: Goal: Adequate nutrition will be maintained Outcome: Progressing   

## 2023-01-19 NOTE — Progress Notes (Signed)
Patient denies n/v. She c/o of abdominal pain 3/10 prn oxycodone given as ordered x2 during this shift. No BM during this shift.

## 2023-01-19 NOTE — Progress Notes (Signed)
Patient ID: Shawna Huerta, female   DOB: [birth year], 62 y.o.   MRN: [id number] S: Feeling better today without N/V/D but still with some abdominal pain. O:BP 101/65 (BP Location: Right Arm)   Pulse 62   Temp 98.6 F (37 C)   Resp 18   Ht 5' 7"$  (1.702 m)   Wt 85.7 kg   SpO2 99%   BMI 29.59 kg/m   Intake/Output Summary (Last 24 hours) at 01/19/2023 0830 Last data filed at 01/19/2023 0323 Gross per 24 hour  Intake 2943.98 ml  Output 1150 ml  Net 1793.98 ml   Intake/Output: I/O last 3 completed shifts: In: [id number] [I.V.:2944; IV Piggyback:1000] Out: 1150 [Urine:1150]  Intake/Output this shift:  No intake/output data recorded. Weight change: 2.692 kg Gen: NAD CVS: RRR Resp:CTA Abd: +BS, soft, mildly tender to palpation, no guarding or rebound Ext: no edema  Recent Labs  Lab 01/17/23 1806 01/18/23 0415 01/19/23 0446  NA 128* 130* 135  K 4.1 3.8 3.6  CL 89* 95* 106  CO2 22 21* 21*  GLUCOSE 118* 99 131*  BUN 124* 117* 86*  CREATININE 8.19* 7.39* 3.64*  ALBUMIN 4.1  --   --   CALCIUM 9.8 9.1 8.4*  AST 10*  --   --   ALT 13  --   --    Liver Function Tests: Recent Labs  Lab 01/17/23 1806  AST 10*  ALT 13  ALKPHOS 49  BILITOT 0.5  PROT 8.1  ALBUMIN 4.1   Recent Labs  Lab 01/17/23 1806  LIPASE 51   No results for input(s): "AMMONIA" in the last 168 hours. CBC: Recent Labs  Lab 01/17/23 1806 01/18/23 0415 01/19/23 0446  WBC 10.0 8.8 6.2  HGB 10.6* 9.7* 8.3*  HCT 33.3* 30.4* 25.7*  MCV 69.8* 70.2* 70.2*  PLT 251 204 171   Cardiac Enzymes: No results for input(s): "CKTOTAL", "CKMB", "CKMBINDEX", "TROPONINI" in the last 168 hours. CBG: Recent Labs  Lab 01/18/23 1606 01/18/23 1658 01/18/23 1822 01/18/23 2203 01/19/23 0728  GLUCAP 68* 64* 111* 120* 111*    Iron Studies: No results for input(s): "IRON", "TIBC", "TRANSFERRIN", "FERRITIN" in the last 72 hours. Studies/Results: CT ABDOMEN PELVIS WO CONTRAST  Result Date: 01/18/2023 CLINICAL DATA:   Abdominal pain EXAM: CT ABDOMEN AND PELVIS WITHOUT CONTRAST TECHNIQUE: Multidetector CT imaging of the abdomen and pelvis was performed following the standard protocol without IV contrast. RADIATION DOSE REDUCTION: This exam was performed according to the departmental dose-optimization program which includes automated exposure control, adjustment of the mA and/or kV according to patient size and/or use of iterative reconstruction technique. COMPARISON:  08/02/2022 FINDINGS: Lower Chest: Normal. Hepatobiliary: Normal hepatic contours. No intra- or extrahepatic biliary dilatation. Status post cholecystectomy. Pancreas: Normal pancreas. No ductal dilatation or peripancreatic fluid collection. Spleen: Normal. Adrenals/Urinary Tract: The adrenal glands are normal. No hydronephrosis, nephroureterolithiasis or solid renal mass. The urinary bladder is normal for degree of distention Stomach/Bowel: There is no hiatal hernia. Normal duodenal course and caliber. No small bowel dilatation or inflammation. No focal colonic abnormality. Normal appendix. Vascular/Lymphatic: Normal course and caliber of the major abdominal vessels. No abdominal or pelvic lymphadenopathy. Reproductive: Status post hysterectomy. No adnexal mass. Other: None. Musculoskeletal: No bony spinal canal stenosis or focal osseous abnormality. IMPRESSION: No acute abnormality of the abdomen or pelvis. Electronically Signed   By: Ulyses Jarred M.D.   On: 01/18/2023 03:42    atorvastatin  10 mg Oral Daily   heparin  5,000 Units Subcutaneous Q8H  influenza vac split quadrivalent PF  0.5 mL Intramuscular Tomorrow-1000   insulin aspart  0-5 Units Subcutaneous QHS   insulin aspart  0-6 Units Subcutaneous TID WC   sodium chloride flush  3 mL Intravenous Q12H    BMET    Component Value Date/Time   NA 135 01/19/2023 0446   NA 126 (L) 01/11/2023 1005   K 3.6 01/19/2023 0446   CL 106 01/19/2023 0446   CO2 21 (L) 01/19/2023 0446   GLUCOSE 131 (H)  01/19/2023 0446   BUN 86 (H) 01/19/2023 0446   BUN 104 (HH) 01/11/2023 1005   CREATININE 3.64 (H) 01/19/2023 0446   CALCIUM 8.4 (L) 01/19/2023 0446   GFRNONAA 14 (L) 01/19/2023 0446   GFRAA >60 07/07/2019 0618   CBC    Component Value Date/Time   WBC 6.2 01/19/2023 0446   RBC 3.66 (L) 01/19/2023 0446   HGB 8.3 (L) 01/19/2023 0446   HGB 11.9 01/11/2023 1005   HCT 25.7 (L) 01/19/2023 0446   HCT 37.7 01/11/2023 1005   PLT 171 01/19/2023 0446   PLT 320 01/11/2023 1005   MCV 70.2 (L) 01/19/2023 0446   MCV 70 (L) 01/11/2023 1005   MCH 22.7 (L) 01/19/2023 0446   MCHC 32.3 01/19/2023 0446   RDW 16.3 (H) 01/19/2023 0446   RDW 17.3 (H) 01/11/2023 1005   LYMPHSABS 3.4 (H) 01/11/2023 1005   MONOABS 0.7 08/02/2022 1736   EOSABS 0.0 01/11/2023 1005   BASOSABS 0.0 01/11/2023 1005    Assessment/Plan:  AKI - Appears to have started earlier in the month with Cr 4 on 01/11/23 and was 1.10 on 12/21/22.  Etiology is presumably combination of volume depletion, IV contrast, NSAID use, and concomitant ACE inhibition and SGLT-2 inhibitor.  BUN/Cr markedly improved overnight with IVF"s.  Continue to hold metformin, lisinopril, Farxiga, and ibuprofen.  Continue with IVF's.  No indication for dialysis at this time.  Will continue to follow UOP and daily SCr.  Avoid nephrotoxic medications including NSAIDs and iodinated intravenous contrast exposure unless the latter is absolutely indicated.   Preferred narcotic agents for pain control are hydromorphone, fentanyl, and methadone. Morphine should not be used.  Avoid Baclofen and avoid oral sodium phosphate and magnesium citrate based laxatives / bowel preps.  Continue strict Input and Output monitoring.  Will monitor the patient closely with you and intervene or adjust therapy as indicated by changes in clinical status/labs  Hyponatremia - due to volume depletion.  resolved with IVF's. Abdominal pain with N/V/D - CT scan without evidence of acute abnormality.   N/V/D improved but still with some abdominal pain.  Plan per primary.  Ordered GI panel on stool and urine culture but no diarrhea so no stool sample.  UA looks clear without evidence of infection. DM type II - Metformin and farxiga on hold.  Plan per primary svc. HTN - low bp, hold meds for now.  Donetta Potts, MD [facility]

## 2023-01-19 NOTE — Progress Notes (Signed)
PROGRESS NOTE Shawna Huerta  [id number] DOB: March 12, 1961 DOA: 01/17/2023 PCP: Alvira Monday, FNP   Brief Narrative/Hospital Course: 44 yof w/ hypertension on lisinopril/amlodipine, hyperlipidemia, and type 2 diabetes mellitus on metformin/Farxiga/Trulicity presented with loose stool 2-3/day x 2 to 3 weeks and recently with nausea, nonbloody vomiting, seen on 01/11/2023 and treated with ciprofloxacin presenting with worsening of her symptoms and with abnormal lab of elevated creatinine at PCP office.  Patient complaining of mild to moderate pain in the mid and lower abdomen no fever or chills. In the ED BP soft 90s to 100, afebrile.  Labs shows hyponatremia 128 elevated BUN/creatinine 124/8.1, was 104/4.1 on 2/14 baseline 1.1 in Jan/24/24, potassium 4.1 bicarb 22 anion gap 17, normal LFTs, microcytic anemia hemoglobin 10.6 g, UA with WBC 11-20, RBC 0-5 bacteria many, leukocyte UA trace protein negative, protein/creatinine 0.17.  CT abdomen pelvis without contrast no acute finding, no hydronephrosis/nephroureterolithiasis or solid renal mass.  1 L bolus normal saline given, patient was admitted for further management     Subjective: Overnight afebrile BP fairly stable although 1 times soft to high 90s.   Labs reviewed with improving creatinine   Assessment and Plan: Principal Problem:   Acute renal failure (ARF) (HCC) Active Problems:   Uncontrolled type 2 diabetes mellitus with hyperglycemia, without long-term current use of insulin (HCC)   Vomiting and diarrhea   Hyponatremia   Essential hypertension   Microcytic anemia   Severe AKI/ATN: Appears to have started earlier in the month w/ creatinine 4 on 01/11/2023, was 1.1 and 1/24.  Etiology presumably combination of volume depletion IV contrast NSAIDs concomitant ACE inhibitor and SGLT2 inhibitor.  Continue to hold metformin and lisinopril for seizure NSAID, with aggressive rehydration creatinine slowly improving.  Appreciate nephrology input.   Monitor strict intake output, daily renal function, continue on aggressive IV fluid hydration Recent Labs    06/20/22 0603 08/02/22 1736 08/03/22 0452 08/04/22 0412 10/13/22 0949 10/17/22 0101 12/21/22 1421 01/11/23 1005 01/17/23 1806 01/18/23 0415 01/19/23 0446  BUN 8 24* 20 13 7 $ 32*  --  104* 124* 117* 86*  CREATININE 0.68 1.50* 0.94 0.72 0.89 1.64* 1.10* 4.02* 8.19* 7.39* 3.64*     Mild abdominal pain nausea vomiting/diarrhea suspect form gastroparesis: CT abdomen no acute finding LFTs lipase normal.  Continue symptomatic management.  Patient was recently treated with ciprofloxacin.  Diet as tolerated  Hypotension Hypertension: BP stable/improved, continue to hold antihypertensives.   Hyponatremia due to hypovolemia/volume depletion: Improved Recent Labs  Lab 01/17/23 1806 01/18/23 0415 01/19/23 0446  NA 128* 130* 135    continue aggressive IVF  Microcytic anemia hemoglobin 9 to 10 g range.  Appears to be about the baseline.  Advise Age-appropriate cancer screening and PCP follow-up. Denies any rectal of vaginal bleeding- had colo 6 yrs ago with 12 polyps- due for next soon. She had anemia panel checked on last year  Recent Labs    08/03/22 0452 08/04/22 0412 10/17/22 0101 01/11/23 1005 01/17/23 1806 01/18/23 0415 01/19/23 0446  HGB 13.3   < > 14.8 11.9 10.6* 9.7* 8.3*  MCV 67.9*   < > 67.7* 70* 69.8* 70.2* 70.2*  FERRITIN 359*  --   --   --   --   --   --   TIBC 233*  --   --   --   --   --   --   IRON 71  --   --   --   --   --   --    < > =  values in this interval not displayed.     Gastroparesis, diagnosed about 2 months ago by PCP: habing nauseas vomiting: cont on symptomatic management.  Diabetes mellitus with hypoglycemia blood sugar improved with D5 in ivf, cont to monitor  Recent Labs  Lab 01/18/23 1606 01/18/23 1658 01/18/23 1822 01/18/23 2203 01/19/23 0728  GLUCAP 68* 64* 111* 120* 111*     HLD continue Lipitor.  DVT prophylaxis: heparin  injection 5,000 Units Start: 01/18/23 0600 Code Status:   Code Status: Full Code Family Communication: plan of care discussed with patient at bedside. Patient status is: inpatient  because of AKI Level of care: Telemetry   Dispo: The patient is from: home alone            Anticipated disposition: TBD anticipate discharge in next 24 to 48 hours if creatinine improves Objective: Vitals last 24 hrs: Vitals:   01/19/23 0100 01/19/23 0328 01/19/23 0500 01/19/23 0932  BP: 110/60 101/65  (!) 94/58  Pulse: 65 62  70  Resp: 18 18    Temp: 98.1 F (36.7 C) 98.6 F (37 C)    TempSrc:      SpO2: 100% 99%  100%  Weight:   85.7 kg   Height:       Weight change: 2.692 kg  Physical Examination: General exam: AA0x3, weak,older appearing HEENT:Oral mucosa moist, Ear/Nose WNL grossly, dentition normal. Respiratory system: bilaterally cleardiminished BS,no use of accessory muscle Cardiovascular system: S1 & S2 +, regular rate,. Gastrointestinal system: Abdomen soft, NT,ND,BS+ Nervous System:Alert, awake, moving extremities and grossly nonfocal Extremities: LE ankle edema neg, lower extremities warm Skin: No rashes,no icterus. MSK: Normal muscle bulk,tone, power   Medications reviewed:  Scheduled Meds:  atorvastatin  10 mg Oral Daily   heparin  5,000 Units Subcutaneous Q8H   insulin aspart  0-5 Units Subcutaneous QHS   insulin aspart  0-6 Units Subcutaneous TID WC   sodium chloride flush  3 mL Intravenous Q12H   Continuous Infusions:  dextrose 5 % and 0.9% NaCl 125 mL/hr at 01/19/23 [id number]    Diet Order             Diet Carb Modified Fluid consistency: Thin; Room service appropriate? Yes  Diet effective now                  Intake/Output Summary (Last 24 hours) at 01/19/2023 1042 Last data filed at 01/19/2023 0948 Gross per 24 hour  Intake 3134.84 ml  Output 1150 ml  Net 1984.84 ml    Net IO Since Admission: 4,072.34 mL [01/19/23 1042]  Wt Readings from Last 3 Encounters:   01/19/23 85.7 kg  01/11/23 84.4 kg  12/22/22 90.7 kg     Unresulted Labs (From admission, onward)     Start     Ordered   01/18/23 1158  Norovirus group 1 & 2 by PCR, stool  (Norovirus group 1 & 2 by PCR, stool panel)  Once,   R        01/18/23 1157   01/18/23 1158  Gastrointestinal Panel by PCR , Stool  (Gastrointestinal Panel by PCR, Stool                                                                                                                                                     **  Does Not include CLOSTRIDIUM DIFFICILE testing. **If CDIFF testing is needed, place order from the "C Difficile Testing" order set.**)  Once,   R        01/18/23 1157   01/18/23 [id number]  Basic metabolic panel  Daily,   R      01/18/23 0114   01/18/23 0500  CBC  Daily,   R      01/18/23 0114          Data Reviewed: I have personally reviewed following labs and imaging studies CBC: Recent Labs  Lab 01/17/23 1806 01/18/23 0415 01/19/23 0446  WBC 10.0 8.8 6.2  HGB 10.6* 9.7* 8.3*  HCT 33.3* 30.4* 25.7*  MCV 69.8* 70.2* 70.2*  PLT 251 204 [id number]    Basic Metabolic Panel: Recent Labs  Lab 01/17/23 1806 01/18/23 0415 01/19/23 0446  NA 128* 130* 135  K 4.1 3.8 3.6  CL 89* 95* 106  CO2 22 21* 21*  GLUCOSE 118* 99 131*  BUN 124* 117* 86*  CREATININE 8.19* 7.39* 3.64*  CALCIUM 9.8 9.1 8.4*    GFR: Estimated Creatinine Clearance: 18.7 mL/min (A) (by C-G formula based on SCr of 3.64 mg/dL (H)). Liver Function Tests:  Recent Labs  Lab 01/17/23 2323  LATICACIDVEN 1.1     No results found for this or any previous visit (from the past 240 hour(s)).  Antimicrobials: Anti-infectives (From admission, onward)    None      Radiology Studies: CT ABDOMEN PELVIS WO CONTRAST  Result Date: 01/18/2023 CLINICAL DATA:  Abdominal pain EXAM: CT ABDOMEN AND PELVIS WITHOUT CONTRAST TECHNIQUE: Multidetector CT imaging of the abdomen and pelvis was performed following the standard protocol without IV  contrast. RADIATION DOSE REDUCTION: This exam was performed according to the departmental dose-optimization program which includes automated exposure control, adjustment of the mA and/or kV according to patient size and/or use of iterative reconstruction technique. COMPARISON:  08/02/2022 FINDINGS: Lower Chest: Normal. Hepatobiliary: Normal hepatic contours. No intra- or extrahepatic biliary dilatation. Status post cholecystectomy. Pancreas: Normal pancreas. No ductal dilatation or peripancreatic fluid collection. Spleen: Normal. Adrenals/Urinary Tract: The adrenal glands are normal. No hydronephrosis, nephroureterolithiasis or solid renal mass. The urinary bladder is normal for degree of distention Stomach/Bowel: There is no hiatal hernia. Normal duodenal course and caliber. No small bowel dilatation or inflammation. No focal colonic abnormality. Normal appendix. Vascular/Lymphatic: Normal course and caliber of the major abdominal vessels. No abdominal or pelvic lymphadenopathy. Reproductive: Status post hysterectomy. No adnexal mass. Other: None. Musculoskeletal: No bony spinal canal stenosis or focal osseous abnormality. IMPRESSION: No acute abnormality of the abdomen or pelvis. Electronically Signed   By: Ulyses Jarred M.D.   On: 01/18/2023 03:42     LOS: 1 day   Antonieta Pert, MD [facility]  01/19/2023, 10:42 AM

## 2023-01-19 NOTE — Discharge Instructions (Signed)
Gastroparesis Nutrition Therapy  Gastroparesis means that your stomach empties very slowly. This happens when the nerves to your stomach are damaged or do not work properly. This can cause bloating, stomach discomfort or pain, feeling full after eating only a small amount of food, nausea, or vomiting. If you have diabetes in addition to gastroparesis, it is important to control your blood glucose. This will help the stomach empty.  Tips Following these tips may help your stomach empty faster:  Eat small, frequent meals (4 to 6 times per day).  Do not eat solid foods that are high in fat and do not add too much fat to foods. See the Foods Not Recommended table for foods that are high fat.  High-fat solid foods may delay the emptying of your stomach.  Liquids that contain fat, such as milkshakes, may be tolerated and can provide needed calories. Do not eat foods high in fiber. Do not take fiber supplements or fiber bulking agents for constipation.  Do not eat foods that increase acid reflux:  Acidic, spicy, fried and greasy foods  Caffeine  Mint Do not drink alcohol or smoke  Do not drink carbonated beverages, as they increase bloating.  Chew foods well before swallowing. Solid foods in the stomach do not empty well. If you have difficulty tolerating solid foods, ground foods may be better.  If symptoms are severe, semi-solid foods or liquids may need to be your main food sources. Choose liquid nutritional supplements that have less than or equal to 2 grams fiber per serving.  Sit upright while eating and sit upright or walk after meals. Do not lie down for 3 to 4 hours after eating to avoid reflux or regurgitation.  If you wish to nap during the day, nap first and then eat.  Drinking fluids at meals can take up room in your stomach, and you might not get enough calories. At every meal, first eat a grain food and a protein food or dairy product if your body can tolerate it. Drink fluids with  calories. It may be better to delay fluids until after the meal and drink more between meals.   Foods Recommended Food Group Foods Recommended   Grains Choose grain foods with less than 2 grams of fiber per serving; these will be made with white flour Crackers: saltines or graham crackers Cold cereal: puffed rice Cream of rice or wheat Grits (fine ground) Gluten free low fiber foods Pretzels White bread, toasted White rice, cook until very soft  Protein Foods Lean meat and poultry: well-cooked, very tender, moist, and chopped fine Fish: tuna, salmon, or white fish Egg whites, scrambled Peanut butter (limit to 1 tablespoon at a time)  Dairy Milk*, drink 2% if tolerated to get more nutrients or lactose-free 2% milk Fortified non-dairy milks: almond, cashew, coconut, or rice (be aware that these options are not good sources of protein so you will need to eat an additional protein food) Fortified pea milk or soymilk (may cause gas and bloating for some) Instant breakfast* (pre-made lactose-free is sold in bottles) Milkshakes* (try blending in  to  cup canned fruit) Ice cream* (low-fat may be tolerated better; use in milkshakes to increase calories) Frozen yogurt Yogurt* Puddings and custard* Sherbet Liquid nutritional supplements with less than or equal to 2 grams fiber per 1 cup serving *Use lactose-free varieties to reduce gas and bloating  Vegetables Canned and well-cooked vegetables without seeds, skins or hulls Carrots, cooked Mashed potatoes (white, red or yellow) Sweet   potato  Fruit Canned, soft and well-cooked fruits without seeds, skins or membranes Applesauce Banana, mashed may be tolerated better Diced peaches/pears fruit cups in juice Melon, very soft, cut into small pieces Fruit nectar juices  Oils When possible choose oils rather than solid fats Canola or olive oil Margarine  Other Clear soup Gelatin Popsicles   Foods Not Recommended Food Group Foods Not  Recommended   Grains Bran Grains foods with 2 or more grams of fiber per serving: barley, brown rice, kasha, quinoa Popcorn Whole grain and high-fiber cereals, including oats or granola Whole grain bread or pasta  Protein Foods Fried meats, poultry or fish Sausage, bacon or hot dogs Seafood Tough meat, meat with gristle: steak, roast beef or pork chops Beans, peas or lentils Nuts  Dairy Cheese slices Liquid nutritional supplements that have more than 2 grams fiber per serving Pea milk, soymilk (may increase gas and bloating)  Vegetables Raw or undercooked vegetables Alfalfa, asparagus, bean sprouts, broccoli, brussels sprouts, cabbage, cauliflower, corn, green peas or any other kind of peas, lima beans, mushrooms, okra, onions, parsnips, peppers, pickles, potato skins, or spinach  Fruit Fresh fruit except for the ones in the foods recommended table Acidic fruit and juices: oranges/orange juice, grapefruit/grapefruit juice, tomatoes/tomato juice Avocado Berries Coconut Dried fruit Fruit skin Mandarin oranges Pineapple  Oils Fried foods of any type  Other Coffee Olives or pickles Pizza Salsa Sushi   Gastroparesis Sample 1-Day Menu  Breakfast 1 slice white toast (1 carbohydrate serving)  1 teaspoon margarine, soft, tub   cup egg substitute  1 cup peach nectar (2 carbohydrate servings)  Morning Snack Smoothie made with:  small banana (1 carbohydrate serving)  1/3 cup Greek strawberry yogurt ( carbohydrate serving)  1 cup 2% milk (1 carbohydrate serving)  Lunch 2 ounces canned chicken  1 teaspoon mayonnaise  9 saltine crackers (1 carbohydrate servings)   cup applesauce (1 carbohydrate serving)  Afternoon Snack 1 slice white toast (1 carbohydrate serving)  1 tablespoon smooth peanut butter  Evening Meal 2 ounces baked fish   cup mashed potatoes (1 carbohydrate serving)  1 teaspoon olive oil  1 cup 2% milk (1 carbohydrate serving)  Evening Snack 1 packet instant  breakfast (1 carbohydrate servings)  1 cup 2% milk (1 carbohydrate serving)   Gastroparesis Vegetarian (Lacto-Ovo) Sample 1-Day Menu  Breakfast  cup cooked farina (1 carbohydrate serving)   cup egg substitute  2 teaspoons olive oil   cup peach nectar (2 carbohydrate servings)   cup 2% milk ( carbohydrate serving)  Morning Snack 1 slice white toast (1 carbohydrate serving)  1 tablespoon smooth peanut butter  Lunch  cup vegetable soup (1 carbohydrate serving)  9 saltine crackers (1 carbohydrate serving)   cup applesauce (1 carbohydrate serving)   cup 2% milk ( carbohydrate serving)  Afternoon Snack 6 ounces plain yogurt (1 carbohydrate serving)   small banana (1 carbohydrate servings)  Evening Meal  cup baked tofu  2/3 cup white rice (2 carbohydrate servings)  2 teaspoons olive oil   cup 2% milk ( carbohydrate serving)  Evening Snack 1 packet instant breakfast (1 carbohydrate servings)  1 cup 2% milk (1 carbohydrate serving)   Gastroparesis Vegan Sample 1-Day Menu  Breakfast  cup cooked farina (1 carbohydrate serving)  1/3 cup tofu scramble  2 teaspoons olive oil   cup peach nectar (2 carbohydrate servings)   cup almond milk fortified with calcium, vitamin B12, and vitamin D  Morning Snack 1 slice white toast (1   carbohydrate serving)  1 tablespoon smooth peanut butter  Lunch  cup vegetable soup (1 carbohydrate serving)  9 saltine crackers (1 carbohydrate serving)   cup applesauce (1 carbohydrate serving)  Afternoon Snack 6 ounces plain soy yogurt (1 carbohydrate servings)   small banana (1 carbohydrate serving)  Evening Meal  cup baked tofu  2/3 cup white rice (2 carbohydrate servings)  2 teaspoons olive oil   cup almond milk fortified with calcium, vitamin B12, and vitamin D  Evening Snack  scoop soy protein powder ( carbohydrate serving)   cup almond milk fortified with calcium, vitamin B12, and vitamin D  Copyright 2020  [employer]  [employer]. All rights reserved.   [facility] Stay Proper nutrition can help your body recover from illness and injury.   Foods and beverages high in protein, vitamins, and minerals help rebuild muscle loss, promote healing, & reduce fall risk.   In addition to eating healthy foods, a nutrition shake is an easy, delicious way to get the nutrition you need during and after your hospital stay  It is recommended that you continue to drink 2 bottles per day of: Glucerna for at least 1 month (30 days) after your hospital stay   Tips for adding a nutrition shake into your routine: As allowed, drink one with vitamins or medications instead of water or juice Enjoy one as a tasty mid-morning or afternoon snack Drink cold or make a milkshake out of it Drink one instead of milk with cereal or snacks Use as a coffee creamer   Available at the following grocery stores and pharmacies:           * [facility] [phone]            For COUPONS visit: www.[email or web address].com/join or [email or web address]   Suggested Substitutions Glucerna Shake = Boost Glucose Control = Carnation Breakfast Essentials SUGAR FREE

## 2023-01-19 NOTE — Progress Notes (Signed)
Initial Nutrition Assessment  DOCUMENTATION CODES:   Not applicable  INTERVENTION:   Multivitamin w/ minerals daily Glucerna Shake po BID, each supplement provides 220 kcal and 10 grams of protein Encourage good PO intake  Diet education  NUTRITION DIAGNOSIS:   Inadequate oral intake related to nausea, vomiting as evidenced by per patient/family report.  GOAL:   Patient will meet greater than or equal to 90% of their needs  MONITOR:   PO intake, Supplement acceptance, Labs, I & O's  REASON FOR ASSESSMENT:   Malnutrition Screening Tool    ASSESSMENT:   62 y.o. female presented to the ED with N/V/D and lab abnormalities. PMH includes HTN, HLD, T2DM, Gastroparesis, and IBS. Pt admitted with AKI and abdominal pain with N/V/D.   2/20 - Admitted; NPO 2/21 - diet advanced to clear liquids 2/22 - diet advanced to Carb Modified   Met with pt in room. Pt reports that her appetite is improving. Reports that she has not had any nausea or vomiting today and no BM in 2 days, endorses some abdominal pain. Reports that at home she was unable to tolerate anything, that she would just throw it back up. States that she will be ok and then have a flare up and unable to tolerate anything and it would be this way for months. Met with PCP recently and suspect that it is related to gastroparesis. RD to provide pt with handout related to gastroparesis in discharge instructions.   Reports that she ate ~50% of her lunch today but it was not something that she normally eats. Reports that she has tolerated that thus far.  Pt reports that her UBW was 250-260# and dropped to 228# and was steady there for a little bit, but now have dropped to 190# over the past few months.  Discussed  the use of oral nutrition supplements while PO intake is poor due to lack of appetite. Pt shares she did not think she could drink them due to being a diabetic, RD shared the brand Glucerna is recommended for diabetics and  can be found at the grocery stores.   Medications reviewed and include: NovoLog SSI, D5 Labs reviewed: Sodium 135, Potassium 3.6, BUN 86, Creatinine 3.64, Hgb A1c 8.9%, 24 hr CBGs 64-134  Diet Order:   Diet Order             Diet Carb Modified Fluid consistency: Thin; Room service appropriate? Yes  Diet effective now                  EDUCATION NEEDS:   Education needs have been addressed  Skin:  Skin Assessment: Reviewed RN Assessment  Last BM:  2/21  Height:  Ht Readings from Last 1 Encounters:  01/17/23 5' 7"$  (1.702 m)   Weight:  Wt Readings from Last 1 Encounters:  01/19/23 85.7 kg   Ideal Body Weight:  61.4 kg  BMI:  Body mass index is 29.59 kg/m.  Estimated Nutritional Needs:  Kcal:  2000-2200 Protein:  100-115 grams Fluid:  >/= 2 L   Hermina Barters RD, LDN Clinical Dietitian See [facility] for contact information.

## 2023-01-20 DIAGNOSIS — N179 Acute kidney failure, unspecified: Secondary | ICD-10-CM | POA: Diagnosis not present

## 2023-01-20 DIAGNOSIS — Z23 Encounter for immunization: Secondary | ICD-10-CM | POA: Diagnosis not present

## 2023-01-20 LAB — BASIC METABOLIC PANEL
Anion gap: 8 (ref 5–15)
BUN: 46 mg/dL — ABNORMAL HIGH (ref 6–20)
CO2: 21 mmol/L — ABNORMAL LOW (ref 22–32)
Calcium: 8.4 mg/dL — ABNORMAL LOW (ref 8.9–10.3)
Chloride: 111 mmol/L (ref 98–111)
Creatinine, Ser: 1.67 mg/dL — ABNORMAL HIGH (ref 0.44–1.00)
GFR, Estimated: 35 mL/min — ABNORMAL LOW (ref >60)
Glucose, Bld: 120 mg/dL — ABNORMAL HIGH (ref 70–99)
Potassium: 3.7 mmol/L (ref 3.5–5.1)
Sodium: 140 mmol/L (ref 135–145)

## 2023-01-20 LAB — CBC
HCT: 25.6 % — ABNORMAL LOW (ref 36.0–46.0)
Hemoglobin: 8 g/dL — ABNORMAL LOW (ref 12.0–15.0)
MCH: 22.2 pg — ABNORMAL LOW (ref 26.0–34.0)
MCHC: 31.3 g/dL (ref 30.0–36.0)
MCV: 70.9 fL — ABNORMAL LOW (ref 80.0–100.0)
Platelets: 172 10*3/uL (ref 150–400)
RBC: 3.61 MIL/uL — ABNORMAL LOW (ref 3.87–5.11)
RDW: 16.9 % — ABNORMAL HIGH (ref 11.5–15.5)
WBC: 6.8 10*3/uL (ref 4.0–10.5)
nRBC: 0 % (ref 0.0–0.2)

## 2023-01-20 LAB — GLUCOSE, CAPILLARY
Glucose-Capillary: 118 mg/dL — ABNORMAL HIGH (ref 70–99)
Glucose-Capillary: 128 mg/dL — ABNORMAL HIGH (ref 70–99)

## 2023-01-20 NOTE — Discharge Summary (Signed)
Physician Discharge Summary  Shawna Huerta [id number] DOB: 28-Jul-1961 DOA: 01/17/2023  PCP: Alvira Monday, FNP  Admit date: 01/17/2023 Discharge date: 01/20/2023 Recommendations for Outpatient Follow-up:  Follow up with PCP in 1 weeks-call for appointment Follow-up with [facility] outpatient Please obtain BMP/CBC in one week from PCP Please follow-up on anemia WORK UP  Discharge Dispo: HOME Discharge Condition: Stable Code Status:   Code Status: Full Code Diet recommendation:  Diet Order             Diet Carb Modified Fluid consistency: Thin; Room service appropriate? Yes  Diet effective now                    Brief/Interim Summary: 24 yof w/ hypertension on lisinopril/amlodipine, hyperlipidemia, and type 2 diabetes mellitus on metformin/Farxiga/Trulicity presented with loose stool 2-3/day x 2 to 3 weeks and recently with nausea, nonbloody vomiting, seen on 01/11/2023 and treated with ciprofloxacin presenting with worsening of her symptoms and with abnormal lab of elevated creatinine at PCP office.  Patient complaining of mild to moderate pain in the mid and lower abdomen no fever or chills. In the ED BP soft 90s to 100, afebrile.  Labs shows hyponatremia 128 elevated BUN/creatinine 124/8.1, was 104/4.1 on 2/14 baseline 1.1 in Jan/24/24, potassium 4.1 bicarb 22 anion gap 17, normal LFTs, microcytic anemia hemoglobin 10.6 g, UA with WBC 11-20, RBC 0-5 bacteria many, leukocyte UA trace protein negative, protein/creatinine 0.17.  CT abdomen pelvis without contrast no acute finding, no hydronephrosis/nephroureterolithiasis or solid renal mass.  1 L bolus normal saline given, patient was admitted for further management.  Given further aggressive IV fluid hydration, seen by nephrology her NSAIDs lisinopril/ Her AKI has nicely improved 1.6, at this time tolerating diet we will discharge her home advised to hold metformin, lisinopril for seizure ibuprofen and follow-up with  nephrology as outpatient cleared for discharge by nephrology.     Discharge Diagnoses:  Principal Problem:   Acute renal failure (ARF) ([place]) Active Problems:   Uncontrolled type 2 diabetes mellitus with hyperglycemia, without long-term current use of insulin (HCC)   Vomiting and diarrhea   Hyponatremia   Essential hypertension   Microcytic anemia  Severe AKI/ATN: Appears to have started earlier in the month w/ creatinine 4 on 01/11/2023, was 1.1 and 1/24.  Etiology presumably combination of volume depletion IV contrast NSAIDs concomitant ACE inhibitor and SGLT2 inhibitor.  Continue to hold metformin and lisinopril ,NSAID, AKI improved, okay for discharge with outpatient follow-up extensively educated on medication that needs to avoid  Recent Labs    08/02/22 1736 08/03/22 0452 08/04/22 0412 10/13/22 0949 10/17/22 0101 12/21/22 1421 01/11/23 1005 01/17/23 1806 01/18/23 0415 01/19/23 0446 01/20/23 0716  BUN 24* '20 13 7 '$ 32*  --  104* 124* 117* 86* 46*  CREATININE 1.50* 0.94 0.72 0.89 1.64* 1.10* 4.02* 8.19* 7.39* 3.64* 1.67*    Mild abdominal pain nausea vomiting/diarrhea suspect form gastroparesis: CT abdomen no acute finding LFTs lipase normal.  Continue symptomatic management.  Patient was recently treated with ciprofloxacin.  No issues at this time tolerating diet.  Hypotension Hypertension: BP stable/improved, continue to hold antihypertensives follow-up with PCP and nephrology,  Hyponatremia due to hypovolemia/volume depletion: Improved Recent Labs  Lab 01/17/23 1806 01/18/23 0415 01/19/23 0446 01/20/23 0716  NA 128* 130* 135 140    continue aggressive IVF  Microcytic anemia hemoglobin 9 to 10 g range.  Appears to be about the baseline> with some in the setting of acute anemia/volume  expansion. Advise Age-appropriate cancer screening and PCP follow-up. Denies any rectal of vaginal bleeding- had colo 6 yrs ago with 12 polyps- due for next soon. She had anemia panel  checked on last year advised to check CBC next week from PCP Recent Labs    08/03/22 0452 08/04/22 0412 01/11/23 1005 01/17/23 1806 01/18/23 0415 01/19/23 0446 01/20/23 0716  HGB 13.3   < > 11.9 10.6* 9.7* 8.3* 8.0*  MCV 67.9*   < > 70* 69.8* 70.2* 70.2* 70.9*  FERRITIN 359*  --   --   --   --   --   --   TIBC 233*  --   --   --   --   --   --   IRON 71  --   --   --   --   --   --    < > = values in this interval not displayed.     Gastroparesis, diagnosed about 2 months ago by PCP: habing nauseas vomiting: cont on symptomatic management.  Nauseous.  Diabetes mellitus with hypoglycemia blood sugar improved with D5 in ivf, cont to monitor  Recent Labs  Lab 01/19/23 0728 01/19/23 1150 01/19/23 1633 01/19/23 2113 01/20/23 0810  GLUCAP 111* 134* 149* 125* 118*    HLD continue Lipitor.    Consults: Nephrology Subjective: Alert awake oriented resting comfortably no nausea vomiting diarrhea.  Tolerating diet.  Discharge Exam: Vitals:   01/19/23 2114 01/20/23 0349  BP: 127/70 123/60  Pulse: 74 72  Resp: 18 18  Temp: 97.9 F (36.6 C) 97.9 F (36.6 C)  SpO2: 98% 100%   General: Pt is alert, awake, not in acute distress Cardiovascular: RRR, S1/S2 +, no rubs, no gallops Respiratory: CTA bilaterally, no wheezing, no rhonchi Abdominal: Soft, NT, ND, bowel sounds + Extremities: no edema, no cyanosis  Discharge Instructions  Discharge Instructions     Discharge instructions   Complete by: As directed    Please call call MD or return to ER for similar or worsening recurring problem that brought you to hospital or if any fever,nausea/vomiting,abdominal pain, uncontrolled pain, chest pain,  shortness of breath or any other alarming symptoms.  Please check blood sugar 3-4 times a day at home, check BMP and CBC next week from PCP and follow-up with nephrology as advised   Please follow-up your doctor as instructed in a week time and call the office for  appointment.  Please avoid alcohol, smoking, or any other illicit substance and maintain healthy habits including taking your regular medications as prescribed.  You were cared for by a hospitalist during your hospital stay. If you have any questions about your discharge medications or the care you received while you were in the hospital after you are discharged, you can call the unit and ask to speak with the hospitalist on call if the hospitalist that took care of you is not available.  Once you are discharged, your primary care physician will handle any further medical issues. Please note that NO REFILLS for any discharge medications will be authorized once you are discharged, as it is imperative that you return to your primary care physician (or establish a relationship with a primary care physician if you do not have one) for your aftercare needs so that they can reassess your need for medications and monitor your lab values   Increase activity slowly   Complete by: As directed       Allergies as of 01/20/2023  Reactions   Iohexol Swelling    Code: HIVES, Desc: PT STATES THE EYES SWOLL AND ITCHED, NEEDS PRE MEDS        Medication List     STOP taking these medications    amLODipine 5 MG tablet Commonly known as: NORVASC   ibuprofen 800 MG tablet Commonly known as: ADVIL   lisinopril 20 MG tablet Commonly known as: ZESTRIL   metFORMIN 1000 MG tablet Commonly known as: GLUCOPHAGE       TAKE these medications    acetaminophen 325 MG tablet Commonly known as: TYLENOL Take 650 mg by mouth every 6 (six) hours as needed for moderate pain.   atorvastatin 10 MG tablet Commonly known as: LIPITOR Take 1 tablet (10 mg total) by mouth daily.   diclofenac Sodium 1 % Gel Commonly known as: VOLTAREN Apply 2 g topically daily as needed (pain).   diphenhydrAMINE 50 MG capsule Commonly known as: BENADRYL Take 50 mg by mouth 1 hour prior to your procedure.   gabapentin  300 MG capsule Commonly known as: NEURONTIN TAKE 1 CAPSULE(300 MG) BY MOUTH THREE TIMES DAILY What changed: See the new instructions.   loperamide 2 MG tablet Commonly known as: IMODIUM A-D Take 1 tablet (2 mg total) by mouth 4 (four) times daily as needed for diarrhea or loose stools.   metoCLOPramide 5 MG tablet Commonly known as: Reglan Take 1 tablet (5 mg total) by mouth 4 (four) times daily.   onetouch ultrasoft lancets Use as instructed   OneTouch Verio test strip Generic drug: glucose blood once daily testing dx e11.65   predniSONE 50 MG tablet Commonly known as: DELTASONE One tablet ('50mg'$ ) 13 hours prior to procedure; one tablet ('50mg'$ ) 7 hours prior to procedure and then one tablet (50 mg) one hour prior to procedure.   Trulicity [id number] Sopn Generic drug: Dulaglutide Inject 0.75 mg into the skin once a week.   UNABLE TO FIND Blood glucose monitor kit check blood glucose twice daily dx: e11.65   UNABLE TO FIND Glucose test strips check blood glucose twice daily dx e11.65   urea 10 % cream Commonly known as: CARMOL Apply topically as needed.   Vitamin D (Ergocalciferol) 1.25 MG (50000 UNIT) Caps capsule Commonly known as: DRISDOL Take 1 capsule (50,000 Units total) by mouth every 7 (seven) days.        Follow-up Information     Alvira Monday, FNP Follow up in 1 week(s).   Specialty: Family Medicine Contact information: [address] [place] [address] [phone]         Donato Heinz, MD. Call.   Specialty: Nephrology Contact information: [place] [address] [phone]                Allergies  Allergen Reactions   Iohexol Swelling     Code: HIVES, Desc: PT STATES THE EYES SWOLL AND ITCHED, NEEDS PRE MEDS     The results of significant diagnostics from this hospitalization (including imaging, microbiology, ancillary and laboratory) are listed below for reference.    Microbiology: No  results found for this or any previous visit (from the past 240 hour(s)).  Procedures/Studies: CT ABDOMEN PELVIS WO CONTRAST  Result Date: 01/18/2023 CLINICAL DATA:  Abdominal pain EXAM: CT ABDOMEN AND PELVIS WITHOUT CONTRAST TECHNIQUE: Multidetector CT imaging of the abdomen and pelvis was performed following the standard protocol without IV contrast. RADIATION DOSE REDUCTION: This exam was performed according to the departmental dose-optimization program which includes  automated exposure control, adjustment of the mA and/or kV according to patient size and/or use of iterative reconstruction technique. COMPARISON:  08/02/2022 FINDINGS: Lower Chest: Normal. Hepatobiliary: Normal hepatic contours. No intra- or extrahepatic biliary dilatation. Status post cholecystectomy. Pancreas: Normal pancreas. No ductal dilatation or peripancreatic fluid collection. Spleen: Normal. Adrenals/Urinary Tract: The adrenal glands are normal. No hydronephrosis, nephroureterolithiasis or solid renal mass. The urinary bladder is normal for degree of distention Stomach/Bowel: There is no hiatal hernia. Normal duodenal course and caliber. No small bowel dilatation or inflammation. No focal colonic abnormality. Normal appendix. Vascular/Lymphatic: Normal course and caliber of the major abdominal vessels. No abdominal or pelvic lymphadenopathy. Reproductive: Status post hysterectomy. No adnexal mass. Other: None. Musculoskeletal: No bony spinal canal stenosis or focal osseous abnormality. IMPRESSION: No acute abnormality of the abdomen or pelvis. Electronically Signed   By: Ulyses Jarred M.D.   On: 01/18/2023 03:42   CT ANGIO CHEST AORTA W/CM & OR WO/CM  Result Date: 12/21/2022 CLINICAL DATA:  Aortic atherosclerosis ATHEROEMBOLIC DISEASE EXAM: CT ANGIOGRAPHY CHEST WITH CONTRAST TECHNIQUE: Multidetector CT imaging of the chest was performed using the standard protocol during bolus administration of intravenous contrast. Multiplanar CT  image reconstructions and MIPs were obtained to evaluate the vascular anatomy. RADIATION DOSE REDUCTION: This exam was performed according to the departmental dose-optimization program which includes automated exposure control, adjustment of the mA and/or kV according to patient size and/or use of iterative reconstruction technique. CONTRAST:  100 mL OMNIPAQUE IOHEXOL 350 MG/ML SOLN COMPARISON:  None Available. FINDINGS: Cardiovascular: Satisfactory opacification of the pulmonary arteries to the segmental level. No evidence of pulmonary embolism. Normal heart size. No pericardial effusion. No aortic aneurysm or dissection. Mediastinum/Nodes: No enlarged mediastinal, hilar, or axillary lymph nodes. Thyroid gland, trachea, and esophagus demonstrate no significant findings. Lungs/Pleura: Lungs are clear. No pleural effusion or pneumothorax. Upper Abdomen: No acute abnormality. Musculoskeletal: No chest wall abnormality. No acute or significant osseous findings. There are thoracic degenerative changes. Review of the MIP images confirms the above findings. IMPRESSION: Unremarkable examination with no PE, aneurysm or dissection. Electronically Signed   By: Sammie Bench M.D.   On: 12/21/2022 15:22    Labs: BNP (last 3 results) No results for input(s): "BNP" in the last 8760 hours. Basic Metabolic Panel: Recent Labs  Lab 01/17/23 1806 01/18/23 0415 01/19/23 0446 01/20/23 0716  NA 128* 130* 135 140  K 4.1 3.8 3.6 3.7  CL 89* 95* 106 111  CO2 22 21* 21* 21*  GLUCOSE 118* 99 131* 120*  BUN 124* 117* 86* 46*  CREATININE 8.19* 7.39* 3.64* 1.67*  CALCIUM 9.8 9.1 8.4* 8.4*   Liver Function Tests: Recent Labs  Lab 01/17/23 1806  AST 10*  ALT 13  ALKPHOS 49  BILITOT 0.5  PROT 8.1  ALBUMIN 4.1   Recent Labs  Lab 01/17/23 1806  LIPASE 51   No results for input(s): "AMMONIA" in the last 168 hours. CBC: Recent Labs  Lab 01/17/23 1806 01/18/23 0415 01/19/23 0446 01/20/23 0716  WBC 10.0 8.8  6.2 6.8  HGB 10.6* 9.7* 8.3* 8.0*  HCT 33.3* 30.4* 25.7* 25.6*  MCV 69.8* 70.2* 70.2* 70.9*  PLT 251 204 171 172  CBG: Recent Labs  Lab 01/19/23 0728 01/19/23 1150 01/19/23 1633 01/19/23 2113 01/20/23 0810  GLUCAP 111* 134* 149* 125* 118*  No results for input(s): "VITAMINB12", "FOLATE", "FERRITIN", "TIBC", "IRON", "RETICCTPCT" in the last 72 hours. Urinalysis    Component Value Date/Time   COLORURINE STRAW (A) 01/18/2023 1310  APPEARANCEUR CLEAR 01/18/2023 1310   LABSPEC 1.008 01/18/2023 1310   PHURINE 5.0 01/18/2023 1310   GLUCOSEU 150 (A) 01/18/2023 1310   HGBUR NEGATIVE 01/18/2023 1310   BILIRUBINUR NEGATIVE 01/18/2023 1310   KETONESUR NEGATIVE 01/18/2023 1310   PROTEINUR NEGATIVE 01/18/2023 1310   UROBILINOGEN 0.2 10/10/2015 1446   NITRITE NEGATIVE 01/18/2023 1310   LEUKOCYTESUR NEGATIVE 01/18/2023 1310   Sepsis Labs Recent Labs  Lab 01/17/23 1806 01/18/23 0415 01/19/23 0446 01/20/23 0716  WBC 10.0 8.8 6.2 6.8   Microbiology No results found for this or any previous visit (from the past 240 hour(s)).   Time coordinating discharge: 25 minutes  SIGNED: Antonieta Pert, MD  [facility] 01/20/2023, 10:45 AM  If 7PM-7AM, please contact night-coverage [email or web address]

## 2023-01-20 NOTE — Plan of Care (Signed)

## 2023-01-20 NOTE — Care Management Important Message (Signed)
Important Message  Patient Details  Name: Shawna Huerta MRN: [id number] Date of Birth: 03-09-1961   Medicare Important Message Given:  N/A - LOS <3 / Initial given by admissions     Tommy Medal 01/20/2023, 10:49 AM

## 2023-01-20 NOTE — Progress Notes (Signed)
Patient ID: Shawna Huerta, female   DOB: Feb 25, 1961, 62 y.o.   MRN: [id number] S: Feeling much better, no recurrence of N/V/D. O:BP 123/60 (BP Location: Right Arm)   Pulse 72   Temp 97.9 F (36.6 C) (Oral)   Resp 18   Ht '5\' 7"'$  (1.702 m)   Wt 84.6 kg   SpO2 100%   BMI 29.21 kg/m   Intake/Output Summary (Last 24 hours) at 01/20/2023 0916 Last data filed at 01/20/2023 0604 Gross per 24 hour  Intake 4011.34 ml  Output --  Net 4011.34 ml   Intake/Output: I/O last 3 completed shifts: In: 5651.1 [P.O.:1200; I.V.:4451.1] Out: 850 [Urine:850]  Intake/Output this shift:  No intake/output data recorded. Weight change: -1.1 kg Gen: NAD CVS: RRR Resp:CTA Abd: +BS, soft, NT/ND Ext: no edema  Recent Labs  Lab 01/17/23 1806 01/18/23 0415 01/19/23 0446 01/20/23 0716  NA 128* 130* 135 140  K 4.1 3.8 3.6 3.7  CL 89* 95* 106 111  CO2 22 21* 21* 21*  GLUCOSE 118* 99 131* 120*  BUN 124* 117* 86* 46*  CREATININE 8.19* 7.39* 3.64* 1.67*  ALBUMIN 4.1  --   --   --   CALCIUM 9.8 9.1 8.4* 8.4*  AST 10*  --   --   --   ALT 13  --   --   --    Liver Function Tests: Recent Labs  Lab 01/17/23 1806  AST 10*  ALT 13  ALKPHOS 49  BILITOT 0.5  PROT 8.1  ALBUMIN 4.1   Recent Labs  Lab 01/17/23 1806  LIPASE 51   No results for input(s): "AMMONIA" in the last 168 hours. CBC: Recent Labs  Lab 01/17/23 1806 01/18/23 0415 01/19/23 0446  WBC 10.0 8.8 6.2  HGB 10.6* 9.7* 8.3*  HCT 33.3* 30.4* 25.7*  MCV 69.8* 70.2* 70.2*  PLT 251 204 171   Cardiac Enzymes: No results for input(s): "CKTOTAL", "CKMB", "CKMBINDEX", "TROPONINI" in the last 168 hours. CBG: Recent Labs  Lab 01/19/23 0728 01/19/23 1150 01/19/23 1633 01/19/23 2113 01/20/23 0810  GLUCAP 111* 134* 149* 125* 118*    Iron Studies: No results for input(s): "IRON", "TIBC", "TRANSFERRIN", "FERRITIN" in the last 72 hours. Studies/Results: No results found.  atorvastatin  10 mg Oral Daily   feeding supplement (GLUCERNA  SHAKE)  237 mL Oral BID BM   heparin  5,000 Units Subcutaneous Q8H   insulin aspart  0-5 Units Subcutaneous QHS   insulin aspart  0-6 Units Subcutaneous TID WC   multivitamin with minerals  1 tablet Oral Daily   sodium chloride flush  3 mL Intravenous Q12H    BMET    Component Value Date/Time   NA 140 01/20/2023 0716   NA 126 (L) 01/11/2023 1005   K 3.7 01/20/2023 0716   CL 111 01/20/2023 0716   CO2 21 (L) 01/20/2023 0716   GLUCOSE 120 (H) 01/20/2023 0716   BUN 46 (H) 01/20/2023 0716   BUN 104 (HH) 01/11/2023 1005   CREATININE 1.67 (H) 01/20/2023 0716   CALCIUM 8.4 (L) 01/20/2023 0716   GFRNONAA 35 (L) 01/20/2023 0716   GFRAA >60 07/07/2019 0618   CBC    Component Value Date/Time   WBC 6.2 01/19/2023 0446   RBC 3.66 (L) 01/19/2023 0446   HGB 8.3 (L) 01/19/2023 0446   HGB 11.9 01/11/2023 1005   HCT 25.7 (L) 01/19/2023 0446   HCT 37.7 01/11/2023 1005   PLT 171 01/19/2023 0446   PLT 320 01/11/2023  1005   MCV 70.2 (L) 01/19/2023 0446   MCV 70 (L) 01/11/2023 1005   MCH 22.7 (L) 01/19/2023 0446   MCHC 32.3 01/19/2023 0446   RDW 16.3 (H) 01/19/2023 0446   RDW 17.3 (H) 01/11/2023 1005   LYMPHSABS 3.4 (H) 01/11/2023 1005   MONOABS 0.7 08/02/2022 1736   EOSABS 0.0 01/11/2023 1005   BASOSABS 0.0 01/11/2023 1005    Assessment/Plan:  AKI - Appears to have started earlier in the month with Cr 4 on 01/11/23 and was 1.10 on 12/21/22.  Etiology is presumably combination of volume depletion, IV contrast, NSAID use, and concomitant ACE inhibition and SGLT-2 inhibitor.  BUN/Cr markedly improved with IVF"s and now down to 1.67.  Continue to hold metformin, lisinopril, Farxiga, and ibuprofen and do not resume until she is seen by her PCP in hospital follow up.  Continue with IVF's.  No indication for dialysis at this time.  Nothing further to add.  Will sign off.  Please call with questions or concerns.  Ok to f/u with our office in 3-4 weeks if she does not return to normal.   Avoid  nephrotoxic medications including NSAIDs and iodinated intravenous contrast exposure unless the latter is absolutely indicated.   Preferred narcotic agents for pain control are hydromorphone, fentanyl, and methadone. Morphine should not be used.  Avoid Baclofen and avoid oral sodium phosphate and magnesium citrate based laxatives / bowel preps.  Continue strict Input and Output monitoring.  Will monitor the patient closely with you and intervene or adjust therapy as indicated by changes in clinical status/labs  Hyponatremia - due to volume depletion.  resolved with IVF's. Abdominal pain with N/V/D - CT scan without evidence of acute abnormality.  N/V/D improved but still with some abdominal pain.  Plan per primary.  Ordered GI panel on stool and urine culture but no diarrhea so no stool sample.  UA looks clear without evidence of infection.  Has not had any N/V/D for past 2 days.  Unclear etiology.  DM type II - Metformin and farxiga on hold.  Plan per primary svc. HTN - low bp, hold meds for now.  Donetta Potts, MD [facility]

## 2023-01-26 ENCOUNTER — Ambulatory Visit (INDEPENDENT_AMBULATORY_CARE_PROVIDER_SITE_OTHER): Payer: Medicare HMO | Admitting: Podiatry

## 2023-01-26 DIAGNOSIS — E1151 Type 2 diabetes mellitus with diabetic peripheral angiopathy without gangrene: Secondary | ICD-10-CM | POA: Diagnosis not present

## 2023-01-26 DIAGNOSIS — I739 Peripheral vascular disease, unspecified: Secondary | ICD-10-CM

## 2023-01-26 DIAGNOSIS — Z794 Long term (current) use of insulin: Secondary | ICD-10-CM

## 2023-01-26 DIAGNOSIS — I96 Gangrene, not elsewhere classified: Secondary | ICD-10-CM

## 2023-01-26 MED ORDER — GABAPENTIN 300 MG PO CAPS: 300.0000 mg | ORAL_CAPSULE | Freq: Two times a day (BID) | ORAL | 2 refills | Status: DC

## 2023-01-26 NOTE — Progress Notes (Signed)
Subjective:  Patient ID: Shawna Huerta, female    DOB: 08-26-1961,  MRN: [id number]  Chief Complaint  Patient presents with   Follow-up    Patient states that she has been  doing good since her last visit. She states no pain.    62 y.o. female presents for follow-up on left fourth toe gangrene.  She states the toe has been doing much better recently.  Much less pain in the fourth toe.  She went and saw the vascular doctor who did a CT angiogram did not show any shower emboli or other area of concern.  Likely cause of the fourth toe isolated gangrene was from a small micro emboli that went to the fourth toe.  There appears this has resolved as the gangrenous area is decreasing in size and is dry and stable at this time pain is much decreased.  She is taking gabapentin which is helping with nerve pain as well.  Past Medical History:  Diagnosis Date   Allergy 2006   Contrast dye   Anemia    Arthritis    Bartholin cyst 07/03/2014   Colitis 08/2015   Treated at [facility]   Diabetes mellitus    Hyperlipidemia LDL goal <70 04/09/2022   Hypertension    Labial abscess 08/2015   Status post I&D by Dr. Ladona Horns   Nicotine addiction 09/03/2013   Obesity    Pancreatitis 09/2015   unknown etiology, no further work-up with gastroenterology    Tuberculosis 1970   Vaginal itching 09/03/2013    Allergies  Allergen Reactions   Iohexol Swelling     Code: HIVES, Desc: PT STATES THE EYES SWOLL AND ITCHED, NEEDS PRE MEDS     ROS: Negative except as per HPI above  Objective:  General: AAO x3, NAD  Dermatological: Attention directed to the fourth toe on the left foot there is noted to be dry gangrenous changes with necrosis of the distal tuft of the left fourth toe however looks improved from prior with decreased size of the gangrenous area  Vascular:  Dorsalis Pedis artery and Posterior Tibial artery pedal pulses are nonpalpable on the left   Neruologic: Grossly intact via light touch  bilateral. Protective threshold intact to all sites bilateral.   Musculoskeletal: Pain with palpation of the distal tuft of the left fourth toe  Gait: Unassisted, Nonantalgic.    Assessment:   1. Gangrene of toe of left foot ([place])   2. PAD (peripheral artery disease) ([place])   3. Type 2 diabetes mellitus with diabetic peripheral angiopathy without gangrene, with long-term current use of insulin ([place])         Plan:  Patient was evaluated and treated and all questions answered.  # Dry gangrene of the distal aspect of the left fourth toe, peripheral arterial disease possibly thromboembolic in nature #DM2 with neuropathy and angiopathy -Again discussed that the gangrene of her left fourth toe appears dry and stable no sign of infection currently -Recommend continuation of the wound care that she has been doing with daily Betadine paint applied to the toe and keeping it very dry -Continue to monitor for worsening gangrene -Recommend leaving the eschar intact until it falls off and will heal and underneath hopefully and will not need to do anything for the toe  Return in about 3 months (around 04/26/2023) for F/u L 4th toe gangrene .          Everitt Amber, DPM [facility]

## 2023-01-30 ENCOUNTER — Encounter: Payer: Self-pay | Admitting: Internal Medicine

## 2023-01-30 ENCOUNTER — Ambulatory Visit (INDEPENDENT_AMBULATORY_CARE_PROVIDER_SITE_OTHER): Payer: Medicare HMO | Admitting: Internal Medicine

## 2023-01-30 VITALS — BP 142/79 | HR 100 | Resp 16 | Ht 67.0 in | Wt 219.0 lb

## 2023-01-30 DIAGNOSIS — I10 Essential (primary) hypertension: Secondary | ICD-10-CM | POA: Diagnosis not present

## 2023-01-30 DIAGNOSIS — D509 Iron deficiency anemia, unspecified: Secondary | ICD-10-CM | POA: Diagnosis not present

## 2023-01-30 DIAGNOSIS — N179 Acute kidney failure, unspecified: Secondary | ICD-10-CM

## 2023-01-30 NOTE — Progress Notes (Signed)
   HPI:Shawna Huerta is a 62 y.o. female who presents for hospitalization follow up.  Patient presented to emergency department 2/20 with nausea, vomiting, diarrhea and admitted for acute kidney injury.  Patient discharged 2/23.  Acute kidney injury thought to be from a combination of volume depletion, IV contrast, NSAIDs, ACE inhibitor, and SGLT2 inhibitor.  Patient improved with holding medications and IV fluids.  Amlodipine, ibuprofen, lisinopril, and metformin stopped at discharged.  She has monitor her blood pressure at home and has been normal.  She has some lower extremity edema which was not present when she left the hospital.  No orthopnea, PND, shortness of breath, nausea vomiting or diarrhea.  Physical Exam: Vitals:   01/30/23 1057  BP: (!) 142/79  Pulse: 100  Resp: 16  SpO2: 98%  Weight: 219 lb (99.3 kg)  Height: 5\' 7"  (1.702 m)   Review of vitals shows weight 186 on 01/20/2023 at hospital , patient reports being weighed out of bed, now 219 pounds  Physical Exam Constitutional:      General: She is not in acute distress.    Appearance: She is not ill-appearing.  Cardiovascular:     Rate and Rhythm: Regular rhythm. Tachycardia present.     Heart sounds: No murmur heard. Pulmonary:     Effort: Pulmonary effort is normal.     Breath sounds: No wheezing or rales.  Musculoskeletal:     Right lower leg: Edema (1+) present.     Left lower leg: Edema (1+) present.      Assessment & Plan:   Shawna Huerta was seen today for hospitalization follow-up.  Microcytic anemia Assessment & Plan: Patient with microcytic anemia, worsening in hospital in setting of AKI and IV fluid administration. She has had no black or red stools, no blood in urine, no vaginal bleeding , or coughing up blood. Patient eats a balanced diet.   Plan Check CBC, reticulocytes, and iron studies.   Orders: -     CBC with Differential/Platelet -     Reticulocytes -     Iron, TIBC and Ferritin Panel  AKI  (acute kidney injury) [facility]) Assessment & Plan: Creatinine 1.67 at discharge from hospital on 2/23. Baseline Cr .9 to 1.1.   Check BMP  Orders: -     BMP8+EGFR  Essential hypertension Assessment & Plan: Amlodipine and Lisinopril stopped for AKI BP 142/79 today. At home patient has been normotensive  Continue to hold BP medications. Continue home monitoring and follow up if BP >130/80       Milus Banister, MD

## 2023-01-30 NOTE — Patient Instructions (Signed)
Thank you, Ms.Delva L Widmer for allowing [place] to provide your care today.   I have ordered the following labs for you:   Lab Orders         CBC with Differential/Platelet         BMP8+EGFR         Reticulocytes         Fe+TIBC+Fer         Reminders: I will follow up with results     Tamsen Snider, M.D.

## 2023-01-31 LAB — CBC WITH DIFFERENTIAL/PLATELET
Basophils Absolute: 0 10*3/uL (ref 0.0–0.2)
Basos: 0 %
EOS (ABSOLUTE): 0.1 10*3/uL (ref 0.0–0.4)
Eos: 1 %
Hematocrit: 27 % — ABNORMAL LOW (ref 34.0–46.6)
Hemoglobin: 7.9 g/dL — ABNORMAL LOW (ref 11.1–15.9)
Immature Grans (Abs): 0 10*3/uL (ref 0.0–0.1)
Immature Granulocytes: 0 %
Lymphocytes Absolute: 2.1 10*3/uL (ref 0.7–3.1)
Lymphs: 25 %
MCH: 22.1 pg — ABNORMAL LOW (ref 26.6–33.0)
MCHC: 29.3 g/dL — ABNORMAL LOW (ref 31.5–35.7)
MCV: 75 fL — ABNORMAL LOW (ref 79–97)
Monocytes Absolute: 0.5 10*3/uL (ref 0.1–0.9)
Monocytes: 6 %
Neutrophils Absolute: 5.8 10*3/uL (ref 1.4–7.0)
Neutrophils: 68 %
Platelets: 282 10*3/uL (ref 150–450)
RBC: 3.58 x10E6/uL — ABNORMAL LOW (ref 3.77–5.28)
RDW: 18.3 % — ABNORMAL HIGH (ref 11.7–15.4)
WBC: 8.5 10*3/uL (ref 3.4–10.8)

## 2023-01-31 LAB — IRON,TIBC AND FERRITIN PANEL
Ferritin: 267 ng/mL — ABNORMAL HIGH (ref 15–150)
Iron Saturation: 17 % (ref 15–55)
Iron: 33 ug/dL (ref 27–159)
Total Iron Binding Capacity: 189 ug/dL — ABNORMAL LOW (ref 250–450)
UIBC: 156 ug/dL (ref 131–425)

## 2023-01-31 LAB — BMP8+EGFR
BUN/Creatinine Ratio: 16 (ref 9–23)
BUN: 14 mg/dL (ref 6–24)
CO2: 19 mmol/L — ABNORMAL LOW (ref 20–29)
Calcium: 9.1 mg/dL (ref 8.7–10.2)
Chloride: 110 mmol/L — ABNORMAL HIGH (ref 96–106)
Creatinine, Ser: 0.86 mg/dL (ref 0.57–1.00)
Glucose: 80 mg/dL (ref 70–99)
Potassium: 4.8 mmol/L (ref 3.5–5.2)
Sodium: 141 mmol/L (ref 134–144)
eGFR: 78 mL/min/{1.73_m2} (ref >59)

## 2023-01-31 LAB — RETICULOCYTES: Retic Ct Pct: 2.3 % (ref 0.6–2.6)

## 2023-01-31 NOTE — Assessment & Plan Note (Addendum)
Creatinine 1.67 at discharge from hospital on 2/23. Baseline Cr .9 to 1.1.   I believe swelling in patients legs from IV fluids, no signs of CHF. She will monitor for improvement.  Check BMP

## 2023-01-31 NOTE — Assessment & Plan Note (Addendum)
Patient with microcytic anemia, worsening in hospital in setting of AKI and IV fluid administration. She has had no black or red stools, no blood in urine, no vaginal bleeding , or coughing up blood. Patient eats a balanced diet.   Plan Check CBC, reticulocytes, and iron studies.

## 2023-01-31 NOTE — Assessment & Plan Note (Signed)
Amlodipine and Lisinopril stopped for AKI BP 142/79 today. At home patient has been normotensive  Continue to hold BP medications. Continue home monitoring and follow up if BP >130/80

## 2023-02-09 ENCOUNTER — Other Ambulatory Visit: Payer: Self-pay | Admitting: Nurse Practitioner

## 2023-02-09 DIAGNOSIS — E785 Hyperlipidemia, unspecified: Secondary | ICD-10-CM

## 2023-03-14 ENCOUNTER — Other Ambulatory Visit: Payer: Self-pay | Admitting: Family Medicine

## 2023-03-14 DIAGNOSIS — E1169 Type 2 diabetes mellitus with other specified complication: Secondary | ICD-10-CM

## 2023-03-14 DIAGNOSIS — E119 Type 2 diabetes mellitus without complications: Secondary | ICD-10-CM

## 2023-04-11 ENCOUNTER — Ambulatory Visit (INDEPENDENT_AMBULATORY_CARE_PROVIDER_SITE_OTHER): Payer: Medicare HMO | Admitting: Family Medicine

## 2023-04-11 ENCOUNTER — Encounter: Payer: Self-pay | Admitting: *Deleted

## 2023-04-11 ENCOUNTER — Encounter: Payer: Self-pay | Admitting: Family Medicine

## 2023-04-11 VITALS — BP 118/72 | HR 81 | Ht 67.0 in | Wt 190.1 lb

## 2023-04-11 DIAGNOSIS — D649 Anemia, unspecified: Secondary | ICD-10-CM

## 2023-04-11 DIAGNOSIS — E785 Hyperlipidemia, unspecified: Secondary | ICD-10-CM | POA: Diagnosis not present

## 2023-04-11 DIAGNOSIS — E559 Vitamin D deficiency, unspecified: Secondary | ICD-10-CM

## 2023-04-11 DIAGNOSIS — E038 Other specified hypothyroidism: Secondary | ICD-10-CM | POA: Diagnosis not present

## 2023-04-11 DIAGNOSIS — Z7985 Long-term (current) use of injectable non-insulin antidiabetic drugs: Secondary | ICD-10-CM

## 2023-04-11 DIAGNOSIS — I10 Essential (primary) hypertension: Secondary | ICD-10-CM

## 2023-04-11 DIAGNOSIS — K3184 Gastroparesis: Secondary | ICD-10-CM | POA: Diagnosis not present

## 2023-04-11 DIAGNOSIS — Z1211 Encounter for screening for malignant neoplasm of colon: Secondary | ICD-10-CM

## 2023-04-11 DIAGNOSIS — E1169 Type 2 diabetes mellitus with other specified complication: Secondary | ICD-10-CM | POA: Diagnosis not present

## 2023-04-11 MED ORDER — TRULICITY 0.75 MG/0.5ML ~~LOC~~ SOAJ: SUBCUTANEOUS | 1 refills | Status: DC

## 2023-04-11 MED ORDER — METOCLOPRAMIDE HCL 5 MG PO TABS: 5.0000 mg | ORAL_TABLET | Freq: Four times a day (QID) | ORAL | 2 refills | Status: DC

## 2023-04-11 MED ORDER — VITAMIN D (ERGOCALCIFEROL) 1.25 MG (50000 UNIT) PO CAPS: 50000.0000 [IU] | ORAL_CAPSULE | ORAL | 1 refills | Status: DC

## 2023-04-11 MED ORDER — ATORVASTATIN CALCIUM 10 MG PO TABS: 10.0000 mg | ORAL_TABLET | Freq: Every day | ORAL | 3 refills | Status: DC

## 2023-04-11 NOTE — Assessment & Plan Note (Addendum)
She is taking Trulicity 0.75 milligrams weekly No reports of polyuria, polydipsia, polyphagia Pending HgA1c Lab Results  Component Value Date   HGBA1C 8.9 (H) 01/11/2023

## 2023-04-11 NOTE — Patient Instructions (Signed)
I appreciate the opportunity to provide care to you today!    Follow up:  3 months  Labs: please stop by the lab today to get your blood drawn (CBC,)  Please pick up your refills at the pharmacy  Referrals today- GI for colonoscopy   Please continue to a heart-healthy diet and increase your physical activities. Try to exercise for at least five days a week.      It was a pleasure to see you and I look forward to continuing to work together on your health and well-being. Please do not hesitate to call the office if you need care or have questions about your care.   Have a wonderful day and week. With Gratitude, Gilmore Laroche MSN, FNP-BC

## 2023-04-11 NOTE — Assessment & Plan Note (Signed)
Encouraged to continue taking atorvastatin 10 mg daily No complaints or concerns voiced Lab Results  Component Value Date   CHOL 148 01/11/2023   HDL 42 01/11/2023   LDLCALC 80 01/11/2023   TRIG 149 01/11/2023   CHOLHDL 3.5 01/11/2023

## 2023-04-11 NOTE — Assessment & Plan Note (Addendum)
Controlled She has resumed therapy on lisinopril 20 mg daily Asymptomatic today in the clinic Low-sodium diet with increased physical activity encouraged BP Readings from Last 3 Encounters:  04/11/23 118/72  01/30/23 (!) 142/79  01/20/23 123/60

## 2023-04-11 NOTE — Progress Notes (Signed)
Established Patient Office Visit  Subjective:  Patient ID: Shawna Huerta, female    DOB: 11/06/1961  Age: 62 y.o. MRN: [id number]  CC:  Chief Complaint  Patient presents with   Chronic Care Management    3 month f/u    HPI Shawna Huerta is a 62 y.o. female with past medical history of type 2 diabetes, hyperlipidemia, and hypertension presents for f/u of  chronic medical conditions. For the details of today's visit, please refer to the assessment and plan.     Past Medical History:  Diagnosis Date   Allergy 2006   Contrast dye   Anemia    Arthritis    Bartholin cyst 07/03/2014   Colitis 08/2015   Treated at [facility]   Diabetes mellitus    Hyperlipidemia LDL goal <70 04/09/2022   Hypertension    Labial abscess 08/2015   Status post I&D by Dr. Marcha Solders   Nicotine addiction 09/03/2013   Obesity    Pancreatitis 09/2015   unknown etiology, no further work-up with gastroenterology    Tuberculosis 1970   Vaginal itching 09/03/2013    Past Surgical History:  Procedure Laterality Date   ABDOMINAL HYSTERECTOMY     ABDOMINAL SURGERY     exploratry with TAH   BALLOON DILATION N/A 10/26/2016   Procedure: Pyloric channel BALLOON DILATION;  Surgeon: Corbin Ade, MD;  Location: [facility];  Service: Endoscopy;  Laterality: N/A;   BARTHOLIN GLAND CYST EXCISION Right 08/20/2014   Procedure: EXCISION OF RIGHT BARTHOLIN'S GLAND CYST;  Surgeon: Lazaro Arms, MD;  Location: [facility] ORS;  Service: Gynecology;  Laterality: Right;   CHOLECYSTECTOMY     COLONOSCOPY  10/2015   Jones Skene: mild diverticulosis, at least 8 polpys removed, multiple tubular adenomas. next tcs in 1 year   ESOPHAGOGASTRODUODENOSCOPY N/A 10/26/2016   Procedure: ESOPHAGOGASTRODUODENOSCOPY (EGD);  Surgeon: Corbin Ade, MD;  Location: [facility];  Service: Endoscopy;  Laterality: N/A;   FOOT SURGERY     FOOT SURGERY Right 2011   shave bone   TONSILLECTOMY     TUBAL LIGATION  1990    Family  History  Problem Relation Age of Onset   Diabetes Mother    Stroke Mother    Cancer Father        liver and colon. Patient unsure primary    Stomach cancer Father    Diabetes Sister    Stomach cancer Sister    Liver cancer Sister    Bone cancer Sister    Diabetes Brother    Lung cancer Brother    Cancer Brother    Heart disease Son    Early death Son    Breast cancer Neg Hx     Social History   Socioeconomic History   Marital status: Single    Spouse name: Not on file   Number of children: 1   Years of education: Not on file   Highest education level: Some college, no degree  Occupational History   Not on file  Tobacco Use   Smoking status: Former    Packs/day: 0.25    Years: 3.00    Additional pack years: 0.00    Total pack years: 0.75    Types: Cigarettes    Quit date: 10/21/2016    Years since quitting: 6.4   Smokeless tobacco: Never  Vaping Use   Vaping Use: Never used  Substance and Sexual Activity   Alcohol use: No   Drug use: Not  Currently    Types: Marijuana    Comment: started using marijuana a month on the weekends to cope with depression.   Sexual activity: Not Currently    Birth control/protection: Surgical  Other Topics Concern   Not on file  Social History Narrative   Had one child now deceased   Lives with her sister, sister recently diagnosed with cancer.    Social Determinants of Health   Financial Resource Strain: Low Risk  (10/24/2022)   Overall Financial Resource Strain (CARDIA)    Difficulty of Paying Living Expenses: Not hard at all  Food Insecurity: No Food Insecurity (04/10/2023)   Hunger Vital Sign    Worried About Running Out of Food in the Last Year: Never true    Ran Out of Food in the Last Year: Never true  Transportation Needs: No Transportation Needs (04/10/2023)   PRAPARE - Administrator, Civil Service (Medical): No    Lack of Transportation (Non-Medical): No  Physical Activity: Insufficiently Active  (04/10/2023)   Exercise Vital Sign    Days of Exercise per Week: 3 days    Minutes of Exercise per Session: 30 min  Stress: No Stress Concern Present (04/10/2023)   [employer] of Occupational Health - Occupational Stress Questionnaire    Feeling of Stress : Not at all  Social Connections: Moderately Integrated (04/10/2023)   Social Connection and Isolation Panel [NHANES]    Frequency of Communication with Friends and Family: More than three times a week    Frequency of Social Gatherings with Friends and Family: Twice a week    Attends Religious Services: More than 4 times per year    Active Member of [employer] or Organizations: Yes    Attends Banker Meetings: 1 to 4 times per year    Marital Status: Separated  Intimate Partner Violence: Not At Risk (01/18/2023)   Humiliation, Afraid, Rape, and Kick questionnaire    Fear of Current or Ex-Partner: No    Emotionally Abused: No    Physically Abused: No    Sexually Abused: No    Outpatient Medications Prior to Visit  Medication Sig Dispense Refill   acetaminophen (TYLENOL) 325 MG tablet Take 650 mg by mouth every 6 (six) hours as needed for moderate pain.     diclofenac Sodium (VOLTAREN) 1 % GEL Apply 2 g topically daily as needed (pain).     gabapentin (NEURONTIN) 300 MG capsule Take 1 capsule (300 mg total) by mouth 2 (two) times daily. 60 capsule 2   Lancets (ONETOUCH ULTRASOFT) lancets Use as instructed 100 each 12   lisinopril (ZESTRIL) 20 MG tablet TAKE 1 TABLET BY MOUTH DAILY 90 tablet 1   loperamide (IMODIUM A-D) 2 MG tablet Take 1 tablet (2 mg total) by mouth 4 (four) times daily as needed for diarrhea or loose stools. 30 tablet 0   ONETOUCH VERIO test strip once daily testing dx e11.65 100 each 5   urea (CARMOL) 10 % cream Apply topically as needed. 71 g 0   atorvastatin (LIPITOR) 10 MG tablet TAKE 1 TABLET(10 MG) BY MOUTH DAILY 90 tablet 3   Dulaglutide (TRULICITY) 0.75 MG/0.5ML SOPN INJECT 0.75 MG SUBCUTANEOUSLY  ONE TIME PER WEEK 2 mL 1   metoCLOPramide (REGLAN) 5 MG tablet Take 1 tablet (5 mg total) by mouth 4 (four) times daily. 90 tablet 2   Vitamin D, Ergocalciferol, (DRISDOL) 1.25 MG (50000 UNIT) CAPS capsule Take 1 capsule (50,000 Units total) by mouth every 7 (seven)  days. 5 capsule 2   dapagliflozin propanediol (FARXIGA) 5 MG TABS tablet TAKE 1 TABLET BY MOUTH DAILY BEFORE BREAKFAST 30 tablet 0   No facility-administered medications prior to visit.    Allergies  Allergen Reactions   Iohexol Swelling     Code: HIVES, Desc: PT STATES THE EYES SWOLL AND ITCHED, NEEDS PRE MEDS     ROS Review of Systems  Constitutional:  Negative for chills and fever.  Eyes:  Negative for visual disturbance.  Respiratory:  Negative for chest tightness and shortness of breath.   Neurological:  Negative for dizziness and headaches.      Objective:    Physical Exam HENT:     Head: Normocephalic.     Mouth/Throat:     Mouth: Mucous membranes are moist.  Cardiovascular:     Rate and Rhythm: Normal rate.     Heart sounds: Normal heart sounds.  Pulmonary:     Effort: Pulmonary effort is normal.     Breath sounds: Normal breath sounds.  Neurological:     Mental Status: She is alert.     BP 118/72   Pulse 81   Ht 5\' 7"  (1.702 m)   Wt 190 lb 1.9 oz (86.2 kg)   SpO2 96%   BMI 29.78 kg/m  Wt Readings from Last 3 Encounters:  04/11/23 190 lb 1.9 oz (86.2 kg)  01/30/23 219 lb (99.3 kg)  01/20/23 186 lb 8.2 oz (84.6 kg)    Lab Results  Component Value Date   TSH 1.150 01/11/2023   Lab Results  Component Value Date   WBC 8.5 01/30/2023   HGB 7.9 (L) 01/30/2023   HCT 27.0 (L) 01/30/2023   MCV 75 (L) 01/30/2023   PLT 282 01/30/2023   Lab Results  Component Value Date   NA 141 01/30/2023   K 4.8 01/30/2023   CO2 19 (L) 01/30/2023   GLUCOSE 80 01/30/2023   BUN 14 01/30/2023   CREATININE 0.86 01/30/2023   BILITOT 0.5 01/17/2023   ALKPHOS 49 01/17/2023   AST 10 (L) 01/17/2023   ALT  13 01/17/2023   PROT 8.1 01/17/2023   ALBUMIN 4.1 01/17/2023   CALCIUM 9.1 01/30/2023   ANIONGAP 8 01/20/2023   EGFR 78 01/30/2023   Lab Results  Component Value Date   CHOL 148 01/11/2023   Lab Results  Component Value Date   HDL 42 01/11/2023   Lab Results  Component Value Date   LDLCALC 80 01/11/2023   Lab Results  Component Value Date   TRIG 149 01/11/2023   Lab Results  Component Value Date   CHOLHDL 3.5 01/11/2023   Lab Results  Component Value Date   HGBA1C 8.9 (H) 01/11/2023      Assessment & Plan:  Essential hypertension Assessment & Plan: Controlled She has resumed therapy on lisinopril 20 mg daily Asymptomatic today in the clinic Low-sodium diet with increased physical activity encouraged BP Readings from Last 3 Encounters:  04/11/23 118/72  01/30/23 (!) 142/79  01/20/23 123/60      Type 2 diabetes mellitus with other specified complication, without long-term current use of insulin (HCC) -     HM Diabetes Foot Exam -     Trulicity; INJECT 0.75 MG SUBCUTANEOUSLY ONE TIME PER WEEK  Dispense: 2 mL; Refill: 1 -     Hemoglobin A1c  Vitamin D deficiency -     Vitamin D (Ergocalciferol); Take 1 capsule (50,000 Units total) by mouth every 7 (seven) days.  Dispense: 20  capsule; Refill: 1  Gastroparesis -     Metoclopramide HCl; Take 1 tablet (5 mg total) by mouth 4 (four) times daily.  Dispense: 90 tablet; Refill: 2  Hyperlipidemia LDL goal <70 Assessment & Plan: Encouraged to continue taking atorvastatin 10 mg daily No complaints or concerns voiced Lab Results  Component Value Date   CHOL 148 01/11/2023   HDL 42 01/11/2023   LDLCALC 80 01/11/2023   TRIG 149 01/11/2023   CHOLHDL 3.5 01/11/2023     Orders: -     Atorvastatin Calcium; Take 1 tablet (10 mg total) by mouth daily.  Dispense: 90 tablet; Refill: 3  Colon cancer screening -     Ambulatory referral to Gastroenterology  Normocytic anemia -     CBC with  Differential/Platelet  Other specified hypothyroidism -     TSH + free T4    Follow-up: Return in about 3 months (around 07/12/2023).   Gilmore Laroche, FNP

## 2023-04-12 ENCOUNTER — Telehealth: Payer: Self-pay | Admitting: Family Medicine

## 2023-04-12 LAB — TSH+FREE T4
Free T4: 1.5 ng/dL (ref 0.82–1.77)
TSH: 0.483 u[IU]/mL (ref 0.450–4.500)

## 2023-04-12 LAB — HEMOGLOBIN A1C
Est. average glucose Bld gHb Est-mCnc: 128 mg/dL
Hgb A1c MFr Bld: 6.1 % — ABNORMAL HIGH (ref 4.8–5.6)

## 2023-04-12 NOTE — Telephone Encounter (Signed)
Patient calling about lab results. 

## 2023-04-12 NOTE — Telephone Encounter (Signed)
Went over labs.  

## 2023-04-12 NOTE — Progress Notes (Signed)
Please inform the patient her hemoglobin A1c has decreased from 8.9 to 6.1.  Keep up the good work!  Encouraged the patient to continue taking Trulicity 0.75 mg weekly.  Her thyroid levels are stable

## 2023-04-13 LAB — CBC WITH DIFFERENTIAL/PLATELET
Basophils Absolute: 0 10*3/uL (ref 0.0–0.2)
Basos: 0 %
EOS (ABSOLUTE): 0.1 10*3/uL (ref 0.0–0.4)
Eos: 1 %
Hematocrit: 35.7 % (ref 34.0–46.6)
Hemoglobin: 10.4 g/dL — ABNORMAL LOW (ref 11.1–15.9)
Immature Grans (Abs): 0 10*3/uL (ref 0.0–0.1)
Immature Granulocytes: 0 %
Lymphocytes Absolute: 3 10*3/uL (ref 0.7–3.1)
Lymphs: 43 %
MCH: 22.4 pg — ABNORMAL LOW (ref 26.6–33.0)
MCHC: 29.1 g/dL — ABNORMAL LOW (ref 31.5–35.7)
MCV: 77 fL — ABNORMAL LOW (ref 79–97)
Monocytes Absolute: 0.4 10*3/uL (ref 0.1–0.9)
Monocytes: 6 %
Neutrophils Absolute: 3.5 10*3/uL (ref 1.4–7.0)
Neutrophils: 50 %
Platelets: 319 10*3/uL (ref 150–450)
RBC: 4.65 x10E6/uL (ref 3.77–5.28)
RDW: 15.9 % — ABNORMAL HIGH (ref 11.7–15.4)
WBC: 7.1 10*3/uL (ref 3.4–10.8)

## 2023-04-13 LAB — SPECIMEN STATUS REPORT

## 2023-04-13 NOTE — Progress Notes (Signed)
Please inform the patient that her hemoglobin has improved from 7.9 to 10.4.  I recommend increasing her dietary intake of iron rich foods such as eggs, broccoli, chicken, salmon, Spanish, and liver

## 2023-05-04 ENCOUNTER — Ambulatory Visit: Payer: Medicare HMO | Admitting: Podiatry

## 2023-05-04 ENCOUNTER — Telehealth: Payer: Self-pay | Admitting: *Deleted

## 2023-05-04 NOTE — Telephone Encounter (Signed)
  Procedure: Colonoscopy  Height: 5'7 Weight: 195lbs       Have you had a colonoscopy before?  11/10/15, Dr. Darrick Penna  Do you have family history of colon cancer?  Yes father  Do you have a family history of polyps? yes  Previous colonoscopy with polyps removed? yes  Do you have a history colorectal cancer?   no  Are you diabetic?  Yes, type 2  Do you have a prosthetic or mechanical heart valve? no  Do you have a pacemaker/defibrillator?   no  Have you had endocarditis/atrial fibrillation?  no  Do you use supplemental oxygen/CPAP?  no  Have you had joint replacement within the last 12 months?  no  Do you tend to be constipated or have to use laxatives?  no   Do you have history of alcohol use? If yes, how much and how often.  no  Do you have history or are you using drugs? If yes, what do are you  using?  no  Have you ever had a stroke/heart attack?  no  Have you ever had a heart or other vascular stent placed,?no  Do you take weight loss medication? no  female patients,: have you had a hysterectomy? yes                              are you post menopausal?                                do you still have your menstrual cycle?     Date of last menstrual period?   Do you take any blood-thinning medications such as: (Plavix, aspirin, Coumadin, Aggrenox, Brilinta, Xarelto, Eliquis, Pradaxa, Savaysa or Effient)? no  If yes we need the name, milligram, dosage and who is prescribing doctor:               Current Outpatient Medications  Medication Sig Dispense Refill   atorvastatin (LIPITOR) 10 MG tablet Take 1 tablet (10 mg total) by mouth daily. 90 tablet 3   Dulaglutide (TRULICITY) 0.75 MG/0.5ML SOPN INJECT 0.75 MG SUBCUTANEOUSLY ONE TIME PER WEEK 2 mL 1   gabapentin (NEURONTIN) 300 MG capsule Take 1 capsule (300 mg total) by mouth 2 (two) times daily. 60 capsule 2   metoCLOPramide (REGLAN) 5 MG tablet Take 1 tablet (5 mg total) by mouth 4 (four) times daily. 90 tablet 2    Vitamin D, Ergocalciferol, (DRISDOL) 1.25 MG (50000 UNIT) CAPS capsule Take 1 capsule (50,000 Units total) by mouth every 7 (seven) days. 20 capsule 1   acetaminophen (TYLENOL) 325 MG tablet Take 650 mg by mouth every 6 (six) hours as needed for moderate pain.     Lancets (ONETOUCH ULTRASOFT) lancets Use as instructed 100 each 12   ONETOUCH VERIO test strip once daily testing dx e11.65 100 each 5   No current facility-administered medications for this visit.    Allergies  Allergen Reactions   Iohexol Swelling     Code: HIVES, Desc: PT STATES THE EYES SWOLL AND ITCHED, NEEDS PRE MEDS

## 2023-05-24 NOTE — Telephone Encounter (Signed)
LMOVM to call back 

## 2023-05-24 NOTE — Telephone Encounter (Signed)
Ok to schedule. ASA 2.  Hold trulicity for 7 days before.

## 2023-05-29 ENCOUNTER — Encounter: Payer: Self-pay | Admitting: *Deleted

## 2023-05-29 NOTE — Telephone Encounter (Signed)
LMTRC. Letter mailed 

## 2023-05-31 ENCOUNTER — Other Ambulatory Visit: Payer: Self-pay | Admitting: Family Medicine

## 2023-05-31 DIAGNOSIS — E1169 Type 2 diabetes mellitus with other specified complication: Secondary | ICD-10-CM

## 2023-06-02 NOTE — Telephone Encounter (Signed)
Pt left vm to schedule procedure.   Called pt back left vm stating that provider is booking out into August. We will give her a call to get her scheduled once we get providers schedule. Any questions or concerns to give [place] a call.

## 2023-06-15 NOTE — Telephone Encounter (Signed)
LMOVM to call back to schedule 

## 2023-06-19 MED ORDER — PEG 3350-KCL-NA BICARB-NACL 420 G PO SOLR: 4000.0000 mL | Freq: Once | ORAL | 0 refills | Status: AC

## 2023-06-19 NOTE — Telephone Encounter (Signed)
Pt called back. She has been scheduled for 8/6. Aware will send instructions via mychart. Rx for prep sent to pharmacy. Aware not to take trulicty 7 days of procedure.

## 2023-06-19 NOTE — Addendum Note (Signed)
Addended by: Armstead Peaks on: 06/19/2023 08:57 AM   Modules accepted: Orders

## 2023-07-03 ENCOUNTER — Telehealth: Payer: Self-pay | Admitting: *Deleted

## 2023-07-03 NOTE — Telephone Encounter (Signed)
-----   Message from Anabel Bene sent at 07/03/2023  7:49 AM EDT ----- This patient LM that she has no one to say with her and she needs to reschedule.  I have pulled her in the depot.  Just let me know what to do with her.  Thanks,

## 2023-07-04 ENCOUNTER — Ambulatory Visit (HOSPITAL_COMMUNITY): Admission: RE | Admit: 2023-07-04 | Payer: Medicare HMO | Source: Home / Self Care

## 2023-07-04 ENCOUNTER — Encounter (HOSPITAL_COMMUNITY): Admission: RE | Payer: Self-pay | Source: Home / Self Care

## 2023-07-04 SURGERY — COLONOSCOPY WITH PROPOFOL
Anesthesia: Monitor Anesthesia Care

## 2023-07-12 ENCOUNTER — Ambulatory Visit (INDEPENDENT_AMBULATORY_CARE_PROVIDER_SITE_OTHER): Payer: Medicare HMO | Admitting: Family Medicine

## 2023-07-12 ENCOUNTER — Encounter: Payer: Self-pay | Admitting: Family Medicine

## 2023-07-12 VITALS — BP 138/82 | HR 90 | Ht 67.0 in | Wt 218.0 lb

## 2023-07-12 DIAGNOSIS — E559 Vitamin D deficiency, unspecified: Secondary | ICD-10-CM | POA: Diagnosis not present

## 2023-07-12 DIAGNOSIS — N951 Menopausal and female climacteric states: Secondary | ICD-10-CM | POA: Diagnosis not present

## 2023-07-12 DIAGNOSIS — I10 Essential (primary) hypertension: Secondary | ICD-10-CM

## 2023-07-12 DIAGNOSIS — E7849 Other hyperlipidemia: Secondary | ICD-10-CM | POA: Diagnosis not present

## 2023-07-12 DIAGNOSIS — Z7985 Long-term (current) use of injectable non-insulin antidiabetic drugs: Secondary | ICD-10-CM

## 2023-07-12 DIAGNOSIS — E1165 Type 2 diabetes mellitus with hyperglycemia: Secondary | ICD-10-CM | POA: Diagnosis not present

## 2023-07-12 DIAGNOSIS — E1169 Type 2 diabetes mellitus with other specified complication: Secondary | ICD-10-CM | POA: Diagnosis not present

## 2023-07-12 DIAGNOSIS — E038 Other specified hypothyroidism: Secondary | ICD-10-CM

## 2023-07-12 DIAGNOSIS — R7301 Impaired fasting glucose: Secondary | ICD-10-CM | POA: Diagnosis not present

## 2023-07-12 MED ORDER — ONETOUCH VERIO VI STRP: ORAL_STRIP | 5 refills | Status: DC

## 2023-07-12 NOTE — Assessment & Plan Note (Signed)
She takes atorvastatin 10 mg daily Encouraged a heart healthy diet with increased physical activity Denies myalgia Pending lipid panel Lab Results  Component Value Date   CHOL 148 01/11/2023   HDL 42 01/11/2023   LDLCALC 80 01/11/2023   TRIG 149 01/11/2023   CHOLHDL 3.5 01/11/2023

## 2023-07-12 NOTE — Assessment & Plan Note (Addendum)
She takes Trulicity 0.75 mg  weekly Denies polyuria, polyphagia, polydipsia No reports of increased nausea, vomiting, and diarrhea with current treatment regimen Pending hemoglobin A1c Lab Results  Component Value Date   HGBA1C 6.1 (H) 04/11/2023

## 2023-07-12 NOTE — Assessment & Plan Note (Signed)
Controlled She takes lisinopril 20 mg daily Asymptomatic  Low-sodium diet with increased physical activity encouraged BP Readings from Last 3 Encounters:  07/12/23 138/82  04/11/23 118/72  01/30/23 (!) 142/79

## 2023-07-12 NOTE — Patient Instructions (Signed)
I appreciate the opportunity to provide care to you today!    Follow up:  3 months  Labs: please stop by the lab today/during the week to get your blood drawn (CBC, CMP, TSH, Lipid profile, HgA1c, Vit D)  Attached with your AVS, you will find valuable resources for self-education. I highly recommend dedicating some time to thoroughly examine them.   Please continue to a heart-healthy diet and increase your physical activities. Try to exercise for at least five days a week.    It was a pleasure to see you and I look forward to continuing to work together on your health and well-being. Please do not hesitate to call the office if you need care or have questions about your care.  In case of emergency, please visit the Emergency Department for urgent care, or contact our clinic at [phone] to schedule an appointment. We're here to help you!   Have a wonderful day and week. With Gratitude, Gilmore Laroche MSN, FNP-BC

## 2023-07-12 NOTE — Assessment & Plan Note (Signed)
Complains of increased flashes and night sweats for about a month Will assess her liver enzymes today and provide a sample of Veozah if her liver enzymes are stable  Encouraged lifestyle modification: Dress in Layers: Child psychotherapist, breathable clothing and use layers that can be easily removed or added as needed. Keep Cool: Use fans, air conditioning, or cooling pads to help maintain a comfortable temperature in your living environment. Avoid Triggers: Identify and avoid common triggers such as spicy foods, caffeine, alcohol, and hot beverages.

## 2023-07-12 NOTE — Progress Notes (Signed)
Established Patient Office Visit  Subjective:  Patient ID: Shawna Huerta, female    DOB: [birth year]  Age: 62 y.o. MRN: [id number]  CC:  Chief Complaint  Patient presents with   Care Management    Follow up   Night Sweats    Pt reports night sweats and hot flashes throughout the day ongoing for 1 month.     HPI Shawna Huerta is a 62 y.o. female with past medical history of hyperlipidemia, type 2 diabetes, vitamin D deficiency presents for f/u of  chronic medical conditions. For the details of today's visit, please refer to the assessment and plan.    Past Medical History:  Diagnosis Date   Allergy 2006   Contrast dye   Anemia    Arthritis    Bartholin cyst 07/03/2014   Colitis 08/2015   Treated at [facility]   Diabetes mellitus    Hyperlipidemia LDL goal <70 04/09/2022   Hypertension    Labial abscess 08/2015   Status post I&D by Dr. Marcha Solders   Nicotine addiction 09/03/2013   Obesity    Pancreatitis 09/2015   unknown etiology, no further work-up with gastroenterology    Tuberculosis 1970   Vaginal itching 09/03/2013    Past Surgical History:  Procedure Laterality Date   ABDOMINAL HYSTERECTOMY     ABDOMINAL SURGERY     exploratry with TAH   BALLOON DILATION N/A 10/26/2016   Procedure: Pyloric channel BALLOON DILATION;  Surgeon: Corbin Ade, MD;  Location: [facility];  Service: Endoscopy;  Laterality: N/A;   BARTHOLIN GLAND CYST EXCISION Right 08/20/2014   Procedure: EXCISION OF RIGHT BARTHOLIN'S GLAND CYST;  Surgeon: Lazaro Arms, MD;  Location: [facility];  Service: Gynecology;  Laterality: Right;   CHOLECYSTECTOMY     COLONOSCOPY  10/2015   [facility] Skene: mild diverticulosis, at least 8 polpys removed, multiple tubular adenomas. next tcs in 1 year   ESOPHAGOGASTRODUODENOSCOPY N/A 10/26/2016   Procedure: ESOPHAGOGASTRODUODENOSCOPY (EGD);  Surgeon: Corbin Ade, MD;  Location: [facility];  Service: Endoscopy;  Laterality: N/A;   FOOT SURGERY      FOOT SURGERY Right 2011   shave bone   TONSILLECTOMY     TUBAL LIGATION  1990    Family History  Problem Relation Age of Onset   Diabetes Mother    Stroke Mother    Cancer Father        liver and colon. Patient unsure primary    Stomach cancer Father    Diabetes Sister    Stomach cancer Sister    Liver cancer Sister    Bone cancer Sister    Diabetes Brother    Lung cancer Brother    Cancer Brother    Heart disease Son    Early death Son    Breast cancer Neg Hx     Social History   Socioeconomic History   Marital status: Single    Spouse name: Not on file   Number of children: 1   Years of education: Not on file   Highest education level: Some college, no degree  Occupational History   Not on file  Tobacco Use   Smoking status: Former    Current packs/day: 0.00    Average packs/day: 0.3 packs/day for 3.0 years (0.8 ttl pk-yrs)    Types: Cigarettes    Start date: 10/21/2013    Quit date: 10/21/2016    Years since quitting: 6.7   Smokeless tobacco: Never  Vaping Use  Vaping status: Never Used  Substance and Sexual Activity   Alcohol use: No   Drug use: Not Currently    Types: Marijuana    Comment: started using marijuana a month on the weekends to cope with depression.   Sexual activity: Not Currently    Birth control/protection: Surgical  Other Topics Concern   Not on file  Social History Narrative   Had one child now deceased   Lives with her sister, sister recently diagnosed with cancer.    Social Determinants of Health   Financial Resource Strain: Low Risk  (10/24/2022)   Overall Financial Resource Strain (CARDIA)    Difficulty of Paying Living Expenses: Not hard at all  Food Insecurity: No Food Insecurity (04/10/2023)   Hunger Vital Sign    Worried About Running Out of Food in the Last Year: Never true    Ran Out of Food in the Last Year: Never true  Transportation Needs: No Transportation Needs (04/10/2023)   PRAPARE - Scientist, research (physical sciences) (Medical): No    Lack of Transportation (Non-Medical): No  Physical Activity: Insufficiently Active (04/10/2023)   Exercise Vital Sign    Days of Exercise per Week: 3 days    Minutes of Exercise per Session: 30 min  Stress: No Stress Concern Present (04/10/2023)   [employer] of Occupational Health - Occupational Stress Questionnaire    Feeling of Stress : Not at all  Social Connections: Moderately Integrated (04/10/2023)   Social Connection and Isolation Panel [NHANES]    Frequency of Communication with Friends and Family: More than three times a week    Frequency of Social Gatherings with Friends and Family: Twice a week    Attends Religious Services: More than 4 times per year    Active Member of [employer] or Organizations: Yes    Attends Banker Meetings: 1 to 4 times per year    Marital Status: Separated  Intimate Partner Violence: Not At Risk (01/18/2023)   Humiliation, Afraid, Rape, and Kick questionnaire    Fear of Current or Ex-Partner: No    Emotionally Abused: No    Physically Abused: No    Sexually Abused: No    Outpatient Medications Prior to Visit  Medication Sig Dispense Refill   acetaminophen (TYLENOL) 325 MG tablet Take 650 mg by mouth every 6 (six) hours as needed for moderate pain.     atorvastatin (LIPITOR) 10 MG tablet Take 1 tablet (10 mg total) by mouth daily. 90 tablet 3   Dulaglutide (TRULICITY) 0.75 MG/0.5ML SOPN INJECT 0.75 MG SUBCUTANEOUSLY ONE TIME PER WEEK 0.5 mL 1   Lancets (ONETOUCH ULTRASOFT) lancets Use as instructed 100 each 12   metoCLOPramide (REGLAN) 5 MG tablet Take 1 tablet (5 mg total) by mouth 4 (four) times daily. 90 tablet 2   Vitamin D, Ergocalciferol, (DRISDOL) 1.25 MG (50000 UNIT) CAPS capsule Take 1 capsule (50,000 Units total) by mouth every 7 (seven) days. 20 capsule 1   ONETOUCH VERIO test strip once daily testing dx e11.65 100 each 5   gabapentin (NEURONTIN) 300 MG capsule Take 1 capsule (300 mg total) by  mouth 2 (two) times daily. 60 capsule 2   No facility-administered medications prior to visit.    Allergies  Allergen Reactions   Iohexol Swelling     Code: HIVES, Desc: PT STATES THE EYES SWOLL AND ITCHED, NEEDS PRE MEDS     ROS Review of Systems  Constitutional:  Negative for chills and fever.  Eyes:  Negative for visual disturbance.  Respiratory:  Negative for chest tightness and shortness of breath.   Neurological:  Negative for dizziness and headaches.      Objective:    Physical Exam HENT:     Head: Normocephalic.     Mouth/Throat:     Mouth: Mucous membranes are moist.  Cardiovascular:     Rate and Rhythm: Normal rate.     Heart sounds: Normal heart sounds.  Pulmonary:     Effort: Pulmonary effort is normal.     Breath sounds: Normal breath sounds.  Neurological:     Mental Status: She is alert.     BP 138/82 (BP Location: Left Arm)   Pulse 90   Ht 5\' 7"  (1.702 m)   Wt 218 lb 0.6 oz (98.9 kg)   SpO2 97%   BMI 34.15 kg/m  Wt Readings from Last 3 Encounters:  07/12/23 218 lb 0.6 oz (98.9 kg)  04/11/23 190 lb 1.9 oz (86.2 kg)  01/30/23 219 lb (99.3 kg)    Lab Results  Component Value Date   TSH 0.483 04/11/2023   Lab Results  Component Value Date   WBC 7.1 04/11/2023   HGB 10.4 (L) 04/11/2023   HCT 35.7 04/11/2023   MCV 77 (L) 04/11/2023   PLT 319 04/11/2023   Lab Results  Component Value Date   NA 141 01/30/2023   K 4.8 01/30/2023   CO2 19 (L) 01/30/2023   GLUCOSE 80 01/30/2023   BUN 14 01/30/2023   CREATININE 0.86 01/30/2023   BILITOT 0.5 01/17/2023   ALKPHOS 49 01/17/2023   AST 10 (L) 01/17/2023   ALT 13 01/17/2023   PROT 8.1 01/17/2023   ALBUMIN 4.1 01/17/2023   CALCIUM 9.1 01/30/2023   ANIONGAP 8 01/20/2023   EGFR 78 01/30/2023   Lab Results  Component Value Date   CHOL 148 01/11/2023   Lab Results  Component Value Date   HDL 42 01/11/2023   Lab Results  Component Value Date   LDLCALC 80 01/11/2023   Lab Results   Component Value Date   TRIG 149 01/11/2023   Lab Results  Component Value Date   CHOLHDL 3.5 01/11/2023   Lab Results  Component Value Date   HGBA1C 6.1 (H) 04/11/2023      Assessment & Plan:  Essential hypertension Assessment & Plan: Controlled She takes lisinopril 20 mg daily Asymptomatic  Low-sodium diet with increased physical activity encouraged BP Readings from Last 3 Encounters:  07/12/23 138/82  04/11/23 118/72  01/30/23 (!) 142/79      Type 2 diabetes mellitus with hyperglycemia, without long-term current use of insulin (HCC) Assessment & Plan: She takes Trulicity 0.75 mg  weekly Denies polyuria, polyphagia, polydipsia No reports of increased nausea, vomiting, and diarrhea with current treatment regimen Pending hemoglobin A1c Lab Results  Component Value Date   HGBA1C 6.1 (H) 04/11/2023       Hyperlipidemia associated with type 2 diabetes mellitus (HCC) Assessment & Plan: She takes atorvastatin 10 mg daily Encouraged a heart healthy diet with increased physical activity Denies myalgia Pending lipid panel Lab Results  Component Value Date   CHOL 148 01/11/2023   HDL 42 01/11/2023   LDLCALC 80 01/11/2023   TRIG 149 01/11/2023   CHOLHDL 3.5 01/11/2023      Vasomotor symptoms due to menopause Assessment & Plan: Complains of increased flashes and night sweats for about a month Will assess her liver enzymes today and provide a sample of Veozah  if her liver enzymes are stable  Encouraged lifestyle modification: Dress in Layers: Child psychotherapist, breathable clothing and use layers that can be easily removed or added as needed. Keep Cool: Use fans, air conditioning, or cooling pads to help maintain a comfortable temperature in your living environment. Avoid Triggers: Identify and avoid common triggers such as spicy foods, caffeine, alcohol, and hot beverages.    IFG (impaired fasting glucose) -     OneTouch Verio; once daily testing dx e11.65   Dispense: 100 each; Refill: 5 -     Hemoglobin A1c  Vitamin D deficiency -     VITAMIN D 25 Hydroxy (Vit-D Deficiency, Fractures)  Other specified hypothyroidism -     TSH + free T4  Other hyperlipidemia -     Lipid panel -     CMP14+EGFR -     CBC with Differential/Platelet  Note: This chart has been completed using Engineer, civil (consulting) software, and while attempts have been made to ensure accuracy, certain words and phrases may not be transcribed as intended.    Follow-up: Return in about 3 months (around 10/12/2023).   Gilmore Laroche, FNP

## 2023-07-14 LAB — CBC WITH DIFFERENTIAL/PLATELET
Basophils Absolute: 0 10*3/uL (ref 0.0–0.2)
Basos: 0 %
EOS (ABSOLUTE): 0.1 10*3/uL (ref 0.0–0.4)
Eos: 1 %
Hematocrit: 31.8 % — ABNORMAL LOW (ref 34.0–46.6)
Hemoglobin: 9.7 g/dL — ABNORMAL LOW (ref 11.1–15.9)
Immature Grans (Abs): 0 10*3/uL (ref 0.0–0.1)
Immature Granulocytes: 0 %
Lymphocytes Absolute: 2.9 10*3/uL (ref 0.7–3.1)
Lymphs: 45 %
MCH: 21.6 pg — ABNORMAL LOW (ref 26.6–33.0)
MCHC: 30.5 g/dL — ABNORMAL LOW (ref 31.5–35.7)
MCV: 71 fL — ABNORMAL LOW (ref 79–97)
Monocytes Absolute: 0.5 10*3/uL (ref 0.1–0.9)
Monocytes: 8 %
Neutrophils Absolute: 3 10*3/uL (ref 1.4–7.0)
Neutrophils: 46 %
Platelets: 216 10*3/uL (ref 150–450)
RBC: 4.49 x10E6/uL (ref 3.77–5.28)
RDW: 17.8 % — ABNORMAL HIGH (ref 11.7–15.4)
WBC: 6.5 10*3/uL (ref 3.4–10.8)

## 2023-07-14 LAB — CMP14+EGFR
ALT: 16 IU/L (ref 0–32)
AST: 13 IU/L (ref 0–40)
Albumin: 4.3 g/dL (ref 3.8–4.9)
Alkaline Phosphatase: 53 IU/L (ref 44–121)
BUN/Creatinine Ratio: 26 — ABNORMAL HIGH (ref 9–23)
BUN: 26 mg/dL — ABNORMAL HIGH (ref 6–24)
Bilirubin Total: 0.3 mg/dL (ref 0.0–1.2)
CO2: 20 mmol/L (ref 20–29)
Calcium: 9.7 mg/dL (ref 8.7–10.2)
Chloride: 104 mmol/L (ref 96–106)
Creatinine, Ser: 1 mg/dL (ref 0.57–1.00)
Globulin, Total: 3 g/dL (ref 1.5–4.5)
Glucose: 98 mg/dL (ref 70–99)
Potassium: 5 mmol/L (ref 3.5–5.2)
Sodium: 142 mmol/L (ref 134–144)
Total Protein: 7.3 g/dL (ref 6.0–8.5)
eGFR: 65 mL/min/{1.73_m2} (ref >59)

## 2023-07-14 LAB — TSH+FREE T4
Free T4: 1.33 ng/dL (ref 0.82–1.77)
TSH: 2.54 u[IU]/mL (ref 0.450–4.500)

## 2023-07-14 LAB — VITAMIN D 25 HYDROXY (VIT D DEFICIENCY, FRACTURES)

## 2023-07-14 LAB — LIPID PANEL
Chol/HDL Ratio: 2.2 ratio (ref 0.0–4.4)
Cholesterol, Total: 146 mg/dL (ref 100–199)
HDL: 65 mg/dL (ref >39)
LDL Chol Calc (NIH): 69 mg/dL (ref 0–99)
Triglycerides: 57 mg/dL (ref 0–149)
VLDL Cholesterol Cal: 12 mg/dL (ref 5–40)

## 2023-07-14 LAB — HEMOGLOBIN A1C
Est. average glucose Bld gHb Est-mCnc: 128 mg/dL
Hgb A1c MFr Bld: 6.1 % — ABNORMAL HIGH (ref 4.8–5.6)

## 2023-07-14 MED ORDER — PEG 3350-KCL-NA BICARB-NACL 420 G PO SOLR: 4000.0000 mL | Freq: Once | ORAL | 0 refills | Status: AC

## 2023-07-14 NOTE — Addendum Note (Signed)
Addended by: Armstead Peaks on: 07/14/2023 07:25 AM   Modules accepted: Orders

## 2023-07-28 ENCOUNTER — Other Ambulatory Visit: Payer: Self-pay | Admitting: Family Medicine

## 2023-07-28 DIAGNOSIS — E1169 Type 2 diabetes mellitus with other specified complication: Secondary | ICD-10-CM

## 2023-08-02 ENCOUNTER — Other Ambulatory Visit: Payer: Self-pay | Admitting: Family Medicine

## 2023-08-02 ENCOUNTER — Other Ambulatory Visit: Payer: Self-pay | Admitting: Podiatry

## 2023-08-02 ENCOUNTER — Telehealth: Payer: Self-pay | Admitting: Family Medicine

## 2023-08-02 DIAGNOSIS — U071 COVID-19: Secondary | ICD-10-CM

## 2023-08-02 MED ORDER — NIRMATRELVIR/RITONAVIR (PAXLOVID)TABLET: 3.0000 | ORAL_TABLET | Freq: Two times a day (BID) | ORAL | 0 refills | Status: AC

## 2023-08-02 NOTE — Progress Notes (Signed)
I called and left a message for the patient informing her that a prescription for Paxlovid has been sent to her pharmacy.

## 2023-08-02 NOTE — Telephone Encounter (Signed)
Rx sent 

## 2023-08-02 NOTE — Telephone Encounter (Signed)
Patient called took home covid test it was positive. Patient having headaches/ dirrehea / sore throat can patient get something called into her pharmacy. There are no openings on provider schedule. Patient call back# [phone].  Pharmacy: [facility]

## 2023-08-03 MED ORDER — GABAPENTIN 300 MG PO CAPS: 300.0000 mg | ORAL_CAPSULE | Freq: Two times a day (BID) | ORAL | 2 refills | Status: DC

## 2023-08-03 NOTE — Telephone Encounter (Signed)
Left vm letting pt know rx is at the pharmacy.

## 2023-08-07 ENCOUNTER — Telehealth: Payer: Self-pay | Admitting: Family Medicine

## 2023-08-07 NOTE — Telephone Encounter (Signed)
Patient called in regard to  TRULICITY  Wants to switch to [facility], it is covered by insurance and patient can get med cheaper than Trulicity  Wants a call back in regard

## 2023-08-08 ENCOUNTER — Other Ambulatory Visit: Payer: Self-pay | Admitting: Family Medicine

## 2023-08-08 ENCOUNTER — Telehealth: Payer: Self-pay | Admitting: Family Medicine

## 2023-08-08 DIAGNOSIS — E1165 Type 2 diabetes mellitus with hyperglycemia: Secondary | ICD-10-CM

## 2023-08-08 MED ORDER — OZEMPIC (0.25 OR 0.5 MG/DOSE) 2 MG/3ML ~~LOC~~ SOPN: 0.2500 mg | PEN_INJECTOR | SUBCUTANEOUS | 0 refills | Status: DC

## 2023-08-08 NOTE — Telephone Encounter (Signed)
Patient called the ozempic  $238.00 is too expensive, is okay to switch her back to the pill patient said that will be fine. Patient asked for call back at [phone].

## 2023-08-08 NOTE — Telephone Encounter (Signed)
Rx sent 

## 2023-08-08 NOTE — Telephone Encounter (Signed)
States medication is still too high says she has reached her max on Tier, states if she has to go back on pill form that is fine with her, please advice on alternative.

## 2023-08-09 ENCOUNTER — Other Ambulatory Visit: Payer: Self-pay | Admitting: Family Medicine

## 2023-08-11 ENCOUNTER — Other Ambulatory Visit: Payer: Self-pay | Admitting: Family Medicine

## 2023-08-11 DIAGNOSIS — E1165 Type 2 diabetes mellitus with hyperglycemia: Secondary | ICD-10-CM

## 2023-08-11 MED ORDER — RYBELSUS 3 MG PO TABS: 3.0000 mg | ORAL_TABLET | Freq: Every day | ORAL | 1 refills | Status: DC

## 2023-08-11 NOTE — Telephone Encounter (Signed)
A prescription for Rybelsus 3 mg is sent to her pharmacy

## 2023-08-16 ENCOUNTER — Encounter: Payer: Self-pay | Admitting: Family Medicine

## 2023-08-16 NOTE — Telephone Encounter (Signed)
lmtrc

## 2023-08-22 ENCOUNTER — Telehealth: Payer: Self-pay | Admitting: *Deleted

## 2023-08-22 NOTE — Telephone Encounter (Signed)
Pt on for 9/26 with Dr. Marletta Lor.

## 2023-08-22 NOTE — Telephone Encounter (Signed)
-----   Message from Shawna Huerta sent at 08/22/2023 11:19 AM EDT ----- This pt called to cancel, she has covid.  I have put her in the depot.  Thanks,

## 2023-08-23 ENCOUNTER — Encounter: Payer: Self-pay | Admitting: *Deleted

## 2023-08-23 ENCOUNTER — Other Ambulatory Visit: Payer: Self-pay | Admitting: Family Medicine

## 2023-08-24 ENCOUNTER — Ambulatory Visit (HOSPITAL_COMMUNITY): Admission: RE | Admit: 2023-08-24 | Payer: Medicare HMO | Source: Home / Self Care

## 2023-08-24 ENCOUNTER — Encounter (HOSPITAL_COMMUNITY): Admission: RE | Payer: Self-pay | Source: Home / Self Care

## 2023-08-24 ENCOUNTER — Telehealth: Payer: Self-pay | Admitting: Family Medicine

## 2023-08-24 SURGERY — COLONOSCOPY WITH PROPOFOL
Anesthesia: Monitor Anesthesia Care

## 2023-08-24 NOTE — Telephone Encounter (Signed)
Pt called in regard top Semaglutide (RYBELSUS) 3 MG TABS   Medication is still too expensive   Wants to try a generic version of med.  Wants a call back

## 2023-08-25 ENCOUNTER — Other Ambulatory Visit: Payer: Self-pay | Admitting: Family Medicine

## 2023-08-25 ENCOUNTER — Telehealth: Payer: Self-pay | Admitting: Pharmacist

## 2023-08-25 DIAGNOSIS — E1165 Type 2 diabetes mellitus with hyperglycemia: Secondary | ICD-10-CM

## 2023-08-25 NOTE — Telephone Encounter (Signed)
Please provide a sample of Rybelsus 3 mg to the patient and inform her that a referral has been placed to the pharmacy for medication assistance to help with the purchase of Rybelsus

## 2023-08-25 NOTE — Telephone Encounter (Signed)
Can you please mail Rybelsus 7mg  PAP to patient and fax PCP portion to PCP office?  Thank you!

## 2023-08-25 NOTE — Telephone Encounter (Signed)
Left a detailed vm, letting pt know rybelsus samples are available for pick up .

## 2023-08-28 ENCOUNTER — Telehealth: Payer: Self-pay

## 2023-08-28 NOTE — Progress Notes (Signed)
Care Guide Note  08/28/2023 Name: Shawna Huerta MRN: [id number] DOB: 1961/05/06  Referred by: Shawna Laroche, FNP Reason for referral : Care Coordination (Outreach to schedule with Pharm d )   Shawna Huerta is a 62 y.o. year old female who is a primary care patient of Shawna Laroche, FNP. Shawna Huerta was referred to the pharmacist for assistance related to DM.    Successful contact was made with the patient to discuss pharmacy services including being ready for the pharmacist to call at least 5 minutes before the scheduled appointment time, to have medication bottles and any blood sugar or blood pressure readings ready for review. The patient agreed to meet with the pharmacist via with the pharmacist via telephone visit on (date/time).  09/20/2023  Penne Lash, RMA Care Guide [facility]  [place], [place] [address] Direct Dial: [phone] [email or web address]

## 2023-08-28 NOTE — Progress Notes (Signed)
Care Guide Note  08/28/2023 Name: [name] MRN: [id number] DOB: Oct 25, 1961  Referred by: Gilmore Laroche, FNP Reason for referral : Care Coordination (Outreach to schedule with Pharm d )   Shawna Huerta is a 62 y.o. year old female who is a primary care patient of Gilmore Laroche, FNP. Shawna Huerta was referred to the pharmacist for assistance related to DM.    An unsuccessful telephone outreach was attempted today to contact the patient who was referred to the pharmacy team for assistance with medication assistance. Additional attempts will be made to contact the patient.   Shawna Huerta, RMA Care Guide [facility]  [place], [place] [address] Direct Dial: [phone] [email or web address]

## 2023-09-20 ENCOUNTER — Other Ambulatory Visit: Payer: Self-pay | Admitting: Pharmacist

## 2023-09-20 ENCOUNTER — Telehealth: Payer: Self-pay | Admitting: Pharmacist

## 2023-09-20 NOTE — Progress Notes (Signed)
09/20/2023 Name: Shawna Huerta MRN: [id number] DOB: 01-24-1961  Chief Complaint  Patient presents with   Diabetes    Shawna Huerta is a 62 y.o. year old female who presented for a telephone visit.   They were referred to the pharmacist by their PCP for assistance in managing medication access.   Subjective:  Care Team: Primary Care Provider: Gilmore Laroche, FNP  Medication Access/Adherence  [phone] [place], [place] - [address] AT [facility] [address] [place] [address] Phone: [phone] Fax: [phone]   Patient reports affordability concerns with their medications: Yes  Patient reports access/transportation concerns to their pharmacy: No  Patient reports adherence concerns with their medications:  No     Diabetes:  Current medications: rybelsus Medications tried in the past: trulicity (changed due to cost)  Current glucose readings: FBG<130 Using one touch meter  Patient denies hypoglycemic s/sx including dizziness, shakiness, sweating. Patient denies hyperglycemic symptoms including polyuria, polydipsia, polyphagia, nocturia, neuropathy, blurred vision.  Current meal patterns:  Discussed meal planning options and Plate method for healthy eating Avoid sugary drinks and desserts Incorporate balanced protein, non starchy veggies, 1 serving of carbohydrate with each meal Increase water intake Increase physical activity as able  Current physical activity: encouraged as able  Current medication access support: needs patient assistance  Objective:  Lab Results  Component Value Date   HGBA1C 6.1 (H) 07/12/2023    Lab Results  Component Value Date   CREATININE 1.00 07/12/2023   BUN 26 (H) 07/12/2023   NA 142 07/12/2023   K 5.0 07/12/2023   CL 104 07/12/2023   CO2 20 07/12/2023    Lab Results  Component Value Date   CHOL 146 07/12/2023   HDL 65 07/12/2023   LDLCALC 69 07/12/2023   TRIG 57 07/12/2023    CHOLHDL 2.2 07/12/2023    Medications Reviewed Today     Reviewed by Danella Maiers, [facility] (Pharmacist) on 09/20/23 at 1431  Med List Status: <None>   Medication Order Taking? Sig Documenting Provider Last Dose Status Informant  acetaminophen (TYLENOL) 325 MG tablet [id number] No Take 650 mg by mouth every 6 (six) hours as needed for moderate pain. [provider] Taking Active Self, Pharmacy Records  atorvastatin (LIPITOR) 10 MG tablet [id number] No Take 1 tablet (10 mg total) by mouth daily. Gilmore Laroche, FNP Taking Active   gabapentin (NEURONTIN) 300 MG capsule [id number]  Take 1 capsule (300 mg total) by mouth 2 (two) times daily. McCaughan, Dia D, DPM  Active   Lancets (ONETOUCH ULTRASOFT) lancets [id number] No Use as instructed Paseda, Baird Kay, FNP Taking Active Self, Pharmacy Records  metoCLOPramide (REGLAN) 5 MG tablet [id number] No Take 1 tablet (5 mg total) by mouth 4 (four) times daily. Gilmore Laroche, FNP Taking Active   [facility] VERIO test strip [id number]  once daily testing dx e11.65 Gilmore Laroche, FNP  Active   Semaglutide (RYBELSUS) 3 MG TABS [id number]  Take 1 tablet (3 mg total) by mouth daily. Gilmore Laroche, FNP  Active   Vitamin D, Ergocalciferol, (DRISDOL) 1.25 MG (50000 UNIT) CAPS capsule [id number] No Take 1 capsule (50,000 Units total) by mouth every 7 (seven) days. Gilmore Laroche, FNP Taking Active              Assessment/Plan:   Diabetes: - Currently controlled - Reviewed long term cardiovascular and renal outcomes of uncontrolled blood sugar - Reviewed goal A1c, goal fasting, and goal 2 hour post prandial  glucose - Recommend to finish Rybelsus samples (patient was on Trulicity and changed to Rybelsus due to cost, but all brand names are expensive as patient is in the coverage gap) Transition to Ozempic 0.25mg  weekly x4 weeks then increase to 0.5mg  weekly as tolerated (once patient assistance arrives at PCP office)  - Recommend to check  glucose daily or if symptomatic - Meets financial criteria for Ozempic patient assistance program through [employer] PAP. Will collaborate with provider, CPhT, and patient to pursue assistance.     Follow Up Plan: 2 months  Kieth Brightly, PharmD, BCACP, CPP Clinical Pharmacist, [facility]

## 2023-09-20 NOTE — Telephone Encounter (Signed)
Patient has already mailed her portion back  If not too late, can we change rybelsus to ozempic 0.5mg  weekly? Just let me know!  Thanks, Raynelle Fanning

## 2023-09-21 NOTE — Telephone Encounter (Signed)
Changing from Rybelsus to Ozempic.

## 2023-09-29 NOTE — Telephone Encounter (Signed)
Rec'd completed patient pages.  Faxed provider pages to pcp.

## 2023-10-12 ENCOUNTER — Ambulatory Visit (INDEPENDENT_AMBULATORY_CARE_PROVIDER_SITE_OTHER): Payer: Medicare HMO | Admitting: Family Medicine

## 2023-10-12 ENCOUNTER — Encounter: Payer: Self-pay | Admitting: Family Medicine

## 2023-10-12 VITALS — BP 124/78 | HR 64 | Ht 67.0 in | Wt 246.8 lb

## 2023-10-12 DIAGNOSIS — E1169 Type 2 diabetes mellitus with other specified complication: Secondary | ICD-10-CM | POA: Diagnosis not present

## 2023-10-12 DIAGNOSIS — E785 Hyperlipidemia, unspecified: Secondary | ICD-10-CM

## 2023-10-12 DIAGNOSIS — E038 Other specified hypothyroidism: Secondary | ICD-10-CM | POA: Diagnosis not present

## 2023-10-12 DIAGNOSIS — E559 Vitamin D deficiency, unspecified: Secondary | ICD-10-CM

## 2023-10-12 DIAGNOSIS — E1165 Type 2 diabetes mellitus with hyperglycemia: Secondary | ICD-10-CM | POA: Diagnosis not present

## 2023-10-12 DIAGNOSIS — I10 Essential (primary) hypertension: Secondary | ICD-10-CM | POA: Diagnosis not present

## 2023-10-12 MED ORDER — GLYBURIDE-METFORMIN 5-500 MG PO TABS: 1.0000 | ORAL_TABLET | Freq: Every day | ORAL | 1 refills | Status: DC

## 2023-10-12 NOTE — Assessment & Plan Note (Signed)
Today, we are out of Rybelsus samples in the clinic, so we are unable to provide them to the patient at this time. Instead, I will initiate therapy with Glucovance 5-500 mg daily and provide a sample of Jardiance 10 mg daily.  Additionally, I recommended that the patient decrease her intake of high-sugar foods and beverages and increase physical activity

## 2023-10-12 NOTE — Progress Notes (Signed)
Established Patient Office Visit  Subjective:  Patient ID: Shawna Huerta, female    DOB: 08-18-1961  Age: 62 y.o. MRN: [id number]  CC:  Chief Complaint  Patient presents with   Care Management    3 month f/u.    HPI Shawna Huerta is a 62 y.o. female with past medical history of type 2 diabetes, hyperlipidemia, and hypertension presents for f/u of  chronic medical conditions.  Type 2 Diabetes: Stable on Rybelsus 3 mg daily. She reports that she has not been on Trulicity since September 2024 due to her insurance currently being in the "donut hole," resulting in a $225 co-pay for the medication. She denies polyuria, polyphagia, and polydipsia.  Hyperlipidemia: Stable on rosuvastatin 10 mg daily with reported compliance.  Hypertension: Compliant with lisinopril 20 mg daily. Asymptomatic in the clinic and reports compliance with treatment.     Past Medical History:  Diagnosis Date   Allergy 2006   Contrast dye   Anemia    Arthritis    Bartholin cyst 07/03/2014   Colitis 08/2015   Treated at [facility]   Diabetes mellitus    Hyperlipidemia LDL goal <70 04/09/2022   Hypertension    Labial abscess 08/2015   Status post I&D by Dr. Marcha Solders   Nicotine addiction 09/03/2013   Obesity    Pancreatitis 09/2015   unknown etiology, no further work-up with gastroenterology    Tuberculosis 1970   Vaginal itching 09/03/2013    Past Surgical History:  Procedure Laterality Date   ABDOMINAL HYSTERECTOMY     ABDOMINAL SURGERY     exploratry with TAH   BALLOON DILATION N/A 10/26/2016   Procedure: Pyloric channel BALLOON DILATION;  Surgeon: Corbin Ade, MD;  Location: [facility];  Service: Endoscopy;  Laterality: N/A;   BARTHOLIN GLAND CYST EXCISION Right 08/20/2014   Procedure: EXCISION OF RIGHT BARTHOLIN'S GLAND CYST;  Surgeon: Lazaro Arms, MD;  Location: [facility];  Service: Gynecology;  Laterality: Right;   CHOLECYSTECTOMY     COLONOSCOPY  10/2015   [facility] Skene:  mild diverticulosis, at least 8 polpys removed, multiple tubular adenomas. next tcs in 1 year   ESOPHAGOGASTRODUODENOSCOPY N/A 10/26/2016   Procedure: ESOPHAGOGASTRODUODENOSCOPY (EGD);  Surgeon: Corbin Ade, MD;  Location: [facility];  Service: Endoscopy;  Laterality: N/A;   FOOT SURGERY     FOOT SURGERY Right 2011   shave bone   TONSILLECTOMY     TUBAL LIGATION  1990    Family History  Problem Relation Age of Onset   Diabetes Mother    Stroke Mother    Cancer Father        liver and colon. Patient unsure primary    Stomach cancer Father    Diabetes Sister    Stomach cancer Sister    Liver cancer Sister    Bone cancer Sister    Diabetes Brother    Lung cancer Brother    Cancer Brother    Heart disease Son    Early death Son    Breast cancer Neg Hx     Social History   Socioeconomic History   Marital status: Single    Spouse name: Not on file   Number of children: 1   Years of education: Not on file   Highest education level: Some college, no degree  Occupational History   Not on file  Tobacco Use   Smoking status: Former    Current packs/day: 0.00    Average packs/day:  0.3 packs/day for 3.0 years (0.8 ttl pk-yrs)    Types: Cigarettes    Start date: 10/21/2013    Quit date: 10/21/2016    Years since quitting: 6.9   Smokeless tobacco: Never  Vaping Use   Vaping status: Never Used  Substance and Sexual Activity   Alcohol use: No   Drug use: Not Currently    Types: Marijuana    Comment: started using marijuana a month on the weekends to cope with depression.   Sexual activity: Not Currently    Birth control/protection: Surgical  Other Topics Concern   Not on file  Social History Narrative   Had one child now deceased   Lives with her sister, sister recently diagnosed with cancer.    Social Determinants of Health   Financial Resource Strain: Low Risk  (10/09/2023)   Overall Financial Resource Strain (CARDIA)    Difficulty of Paying Living Expenses:  Not very hard  Food Insecurity: No Food Insecurity (10/09/2023)   Hunger Vital Sign    Worried About Running Out of Food in the Last Year: Never true    Ran Out of Food in the Last Year: Never true  Transportation Needs: No Transportation Needs (10/09/2023)   PRAPARE - Administrator, Civil Service (Medical): No    Lack of Transportation (Non-Medical): No  Physical Activity: Sufficiently Active (10/09/2023)   Exercise Vital Sign    Days of Exercise per Week: 4 days    Minutes of Exercise per Session: 40 min  Stress: Stress Concern Present (10/09/2023)   [employer] of Occupational Health - Occupational Stress Questionnaire    Feeling of Stress : To some extent  Social Connections: Unknown (10/09/2023)   Social Connection and Isolation Panel [NHANES]    Frequency of Communication with Friends and Family: More than three times a week    Frequency of Social Gatherings with Friends and Family: Three times a week    Attends Religious Services: More than 4 times per year    Active Member of Clubs or Organizations: Yes    Attends Banker Meetings: More than 4 times per year    Marital Status: Patient declined  Intimate Partner Violence: Not At Risk (01/18/2023)   Humiliation, Afraid, Rape, and Kick questionnaire    Fear of Current or Ex-Partner: No    Emotionally Abused: No    Physically Abused: No    Sexually Abused: No    Outpatient Medications Prior to Visit  Medication Sig Dispense Refill   acetaminophen (TYLENOL) 325 MG tablet Take 650 mg by mouth every 6 (six) hours as needed for moderate pain.     atorvastatin (LIPITOR) 10 MG tablet Take 1 tablet (10 mg total) by mouth daily. 90 tablet 3   gabapentin (NEURONTIN) 300 MG capsule Take 1 capsule (300 mg total) by mouth 2 (two) times daily. 60 capsule 2   Lancets (ONETOUCH ULTRASOFT) lancets Use as instructed 100 each 12   metoCLOPramide (REGLAN) 5 MG tablet Take 1 tablet (5 mg total) by mouth 4 (four)  times daily. 90 tablet 2   ONETOUCH VERIO test strip once daily testing dx e11.65 100 each 5   Semaglutide (RYBELSUS) 3 MG TABS Take 1 tablet (3 mg total) by mouth daily. 30 tablet 1   Vitamin D, Ergocalciferol, (DRISDOL) 1.25 MG (50000 UNIT) CAPS capsule Take 1 capsule (50,000 Units total) by mouth every 7 (seven) days. 20 capsule 1   No facility-administered medications prior to visit.  Allergies  Allergen Reactions   Iohexol Swelling     Code: HIVES, Desc: PT STATES THE EYES SWOLL AND ITCHED, NEEDS PRE MEDS     ROS Review of Systems  Constitutional:  Negative for chills and fever.  Eyes:  Negative for visual disturbance.  Respiratory:  Negative for chest tightness and shortness of breath.   Neurological:  Negative for dizziness and headaches.      Objective:    Physical Exam HENT:     Head: Normocephalic.     Mouth/Throat:     Mouth: Mucous membranes are moist.  Cardiovascular:     Rate and Rhythm: Normal rate.     Heart sounds: Normal heart sounds.  Pulmonary:     Effort: Pulmonary effort is normal.     Breath sounds: Normal breath sounds.  Neurological:     Mental Status: She is alert.     BP 124/78   Pulse 64   Ht 5\' 7"  (1.702 m)   Wt 246 lb 12.8 oz (111.9 kg)   SpO2 100%   BMI 38.65 kg/m  Wt Readings from Last 3 Encounters:  10/12/23 246 lb 12.8 oz (111.9 kg)  07/12/23 218 lb 0.6 oz (98.9 kg)  04/11/23 190 lb 1.9 oz (86.2 kg)    Lab Results  Component Value Date   TSH 2.540 07/12/2023   Lab Results  Component Value Date   WBC 6.5 07/12/2023   HGB 9.7 (L) 07/12/2023   HCT 31.8 (L) 07/12/2023   MCV 71 (L) 07/12/2023   PLT 216 07/12/2023   Lab Results  Component Value Date   NA 142 07/12/2023   K 5.0 07/12/2023   CO2 20 07/12/2023   GLUCOSE 98 07/12/2023   BUN 26 (H) 07/12/2023   CREATININE 1.00 07/12/2023   BILITOT 0.3 07/12/2023   ALKPHOS 53 07/12/2023   AST 13 07/12/2023   ALT 16 07/12/2023   PROT 7.3 07/12/2023   ALBUMIN 4.3  07/12/2023   CALCIUM 9.7 07/12/2023   ANIONGAP 8 01/20/2023   EGFR 65 07/12/2023   Lab Results  Component Value Date   CHOL 146 07/12/2023   Lab Results  Component Value Date   HDL 65 07/12/2023   Lab Results  Component Value Date   LDLCALC 69 07/12/2023   Lab Results  Component Value Date   TRIG 57 07/12/2023   Lab Results  Component Value Date   CHOLHDL 2.2 07/12/2023   Lab Results  Component Value Date   HGBA1C 6.1 (H) 07/12/2023      Assessment & Plan:  Type 2 diabetes mellitus with hyperglycemia, without long-term current use of insulin (HCC) Assessment & Plan: Today, we are out of Rybelsus samples in the clinic, so we are unable to provide them to the patient at this time. Instead, I will initiate therapy with Glucovance 5-500 mg daily and provide a sample of Jardiance 10 mg daily.  Additionally, I recommended that the patient decrease her intake of high-sugar foods and beverages and increase physical activity    Orders: -     Hemoglobin A1c -     glyBURIDE-metFORMIN; Take 1 tablet by mouth daily with breakfast.  Dispense: 90 tablet; Refill: 1  Essential hypertension Assessment & Plan: Controlled Encouraged to continue taking lisinopril 20 mg daily Asymptomatic  Low-sodium diet with increased physical activity encouraged BP Readings from Last 3 Encounters:  10/12/23 124/78  07/12/23 138/82  04/11/23 118/72      Hyperlipidemia associated with type 2 diabetes mellitus (HCC)  Assessment & Plan: Encouraged to continue taking atorvastatin 10 mg daily Encouraged a heart healthy diet with increased physical activity Denies myalgia Pending lipid panel Lab Results  Component Value Date   CHOL 146 07/12/2023   HDL 65 07/12/2023   LDLCALC 69 07/12/2023   TRIG 57 07/12/2023   CHOLHDL 2.2 07/12/2023     Orders: -     Lipid panel -     CMP14+EGFR -     CBC with Differential/Platelet  Vitamin D deficiency -     VITAMIN D 25 Hydroxy (Vit-D  Deficiency, Fractures)  TSH (thyroid-stimulating hormone deficiency) -     TSH + free T4  Note: This chart has been completed using Engineer, civil (consulting) software, and while attempts have been made to ensure accuracy, certain words and phrases may not be transcribed as intended.    Follow-up: Return in about 4 months (around 02/09/2024).   Gilmore Laroche, FNP

## 2023-10-12 NOTE — Patient Instructions (Signed)
I appreciate the opportunity to provide care to you today!    Follow up:  4 months  Labs: please stop by the lab during the week to get your blood drawn (CBC, CMP, TSH, Lipid profile, HgA1c, Vit D)   For managing diabetes, I recommend the following lifestyle changes:  Reduce Intake of High-Sugar Foods and Beverages: Limit foods and drinks high in sugar to help regulate blood sugar levels. Increase Consumption of Nutrient-Rich Foods: Focus on incorporating more fruits, vegetables, and whole grains into your diet. Choose Lean Proteins: Opt for lean proteins such as chicken, fish, beans, and legumes. Select Low-Fat Dairy Products: Choose low-fat or non-fat dairy options. Minimize Saturated Fats, Trans Fats, and Cholesterol: Reduce intake of foods high in saturated fats, trans fatty acids, and cholesterol. Engage in Regular Physical Activity: Aim for at least 30 minutes of brisk walking or other moderate activity at least 5 days a week.      Please continue to a heart-healthy diet and increase your physical activities. Try to exercise for at least five days a week.    It was a pleasure to see you and I look forward to continuing to work together on your health and well-being. Please do not hesitate to call the office if you need care or have questions about your care.  In case of emergency, please visit the Emergency Department for urgent care, or contact our clinic at [phone] to schedule an appointment. We're here to help you!   Have a wonderful day and week. With Gratitude, Gilmore Laroche MSN, FNP-BC

## 2023-10-12 NOTE — Assessment & Plan Note (Signed)
Encouraged to continue taking atorvastatin 10 mg daily Encouraged a heart healthy diet with increased physical activity Denies myalgia Pending lipid panel Lab Results  Component Value Date   CHOL 146 07/12/2023   HDL 65 07/12/2023   LDLCALC 69 07/12/2023   TRIG 57 07/12/2023   CHOLHDL 2.2 07/12/2023

## 2023-10-12 NOTE — Assessment & Plan Note (Signed)
Controlled Encouraged to continue taking lisinopril 20 mg daily Asymptomatic  Low-sodium diet with increased physical activity encouraged BP Readings from Last 3 Encounters:  10/12/23 124/78  07/12/23 138/82  04/11/23 118/72

## 2023-10-16 DIAGNOSIS — E1169 Type 2 diabetes mellitus with other specified complication: Secondary | ICD-10-CM | POA: Diagnosis not present

## 2023-10-16 DIAGNOSIS — E1165 Type 2 diabetes mellitus with hyperglycemia: Secondary | ICD-10-CM | POA: Diagnosis not present

## 2023-10-16 DIAGNOSIS — E038 Other specified hypothyroidism: Secondary | ICD-10-CM | POA: Diagnosis not present

## 2023-10-16 DIAGNOSIS — E785 Hyperlipidemia, unspecified: Secondary | ICD-10-CM | POA: Diagnosis not present

## 2023-10-16 DIAGNOSIS — E559 Vitamin D deficiency, unspecified: Secondary | ICD-10-CM | POA: Diagnosis not present

## 2023-10-17 ENCOUNTER — Encounter: Payer: Self-pay | Admitting: *Deleted

## 2023-10-17 LAB — CMP14+EGFR
ALT: 15 [IU]/L (ref 0–32)
AST: 12 [IU]/L (ref 0–40)
Albumin: 4.1 g/dL (ref 3.8–4.9)
Alkaline Phosphatase: 63 [IU]/L (ref 44–121)
BUN/Creatinine Ratio: 17 (ref 12–28)
BUN: 15 mg/dL (ref 8–27)
Bilirubin Total: 0.3 mg/dL (ref 0.0–1.2)
CO2: 22 mmol/L (ref 20–29)
Calcium: 9.7 mg/dL (ref 8.7–10.3)
Chloride: 106 mmol/L (ref 96–106)
Creatinine, Ser: 0.9 mg/dL (ref 0.57–1.00)
Globulin, Total: 3.2 g/dL (ref 1.5–4.5)
Glucose: 199 mg/dL — ABNORMAL HIGH (ref 70–99)
Potassium: 4.8 mmol/L (ref 3.5–5.2)
Sodium: 139 mmol/L (ref 134–144)
Total Protein: 7.3 g/dL (ref 6.0–8.5)
eGFR: 73 mL/min/{1.73_m2} (ref >59)

## 2023-10-17 LAB — LIPID PANEL
Chol/HDL Ratio: 2 ratio (ref 0.0–4.4)
Cholesterol, Total: 126 mg/dL (ref 100–199)
HDL: 62 mg/dL (ref >39)
LDL Chol Calc (NIH): 53 mg/dL (ref 0–99)
Triglycerides: 49 mg/dL (ref 0–149)
VLDL Cholesterol Cal: 11 mg/dL (ref 5–40)

## 2023-10-17 LAB — CBC WITH DIFFERENTIAL/PLATELET
Basophils Absolute: 0 10*3/uL (ref 0.0–0.2)
Basos: 0 %
EOS (ABSOLUTE): 0.1 10*3/uL (ref 0.0–0.4)
Eos: 1 %
Hematocrit: 34.5 % (ref 34.0–46.6)
Hemoglobin: 10.1 g/dL — ABNORMAL LOW (ref 11.1–15.9)
Immature Grans (Abs): 0 10*3/uL (ref 0.0–0.1)
Immature Granulocytes: 0 %
Lymphocytes Absolute: 2.6 10*3/uL (ref 0.7–3.1)
Lymphs: 37 %
MCH: 21 pg — ABNORMAL LOW (ref 26.6–33.0)
MCHC: 29.3 g/dL — ABNORMAL LOW (ref 31.5–35.7)
MCV: 72 fL — ABNORMAL LOW (ref 79–97)
Monocytes Absolute: 0.5 10*3/uL (ref 0.1–0.9)
Monocytes: 7 %
Neutrophils Absolute: 3.7 10*3/uL (ref 1.4–7.0)
Neutrophils: 55 %
Platelets: 217 10*3/uL (ref 150–450)
RBC: 4.81 x10E6/uL (ref 3.77–5.28)
RDW: 16.5 % — ABNORMAL HIGH (ref 11.7–15.4)
WBC: 6.9 10*3/uL (ref 3.4–10.8)

## 2023-10-17 LAB — TSH+FREE T4
Free T4: 1.2 ng/dL (ref 0.82–1.77)
TSH: 1.46 u[IU]/mL (ref 0.450–4.500)

## 2023-10-17 LAB — VITAMIN D 25 HYDROXY (VIT D DEFICIENCY, FRACTURES): Vit D, 25-Hydroxy: 43 ng/mL (ref 30.0–100.0)

## 2023-10-17 LAB — HEMOGLOBIN A1C
Est. average glucose Bld gHb Est-mCnc: 183 mg/dL
Hgb A1c MFr Bld: 8 % — ABNORMAL HIGH (ref 4.8–5.6)

## 2023-10-25 ENCOUNTER — Other Ambulatory Visit: Payer: Self-pay | Admitting: Family Medicine

## 2023-10-25 DIAGNOSIS — E1165 Type 2 diabetes mellitus with hyperglycemia: Secondary | ICD-10-CM

## 2023-10-25 MED ORDER — RYBELSUS 3 MG PO TABS: 3.0000 mg | ORAL_TABLET | Freq: Every day | ORAL | 1 refills | Status: DC

## 2023-10-30 ENCOUNTER — Telehealth: Payer: Self-pay | Admitting: Pharmacist

## 2023-10-30 ENCOUNTER — Telehealth: Payer: Self-pay | Admitting: Family Medicine

## 2023-10-30 ENCOUNTER — Encounter: Payer: Self-pay | Admitting: Pharmacist

## 2023-10-30 NOTE — Telephone Encounter (Signed)
Spoke to pt let her know rx has not been received in the office as of yet will call her once received.

## 2023-10-30 NOTE — Telephone Encounter (Signed)
I'm off today, but saw patient was calling in Did the PCP ever send pages back? Should we have the patient call novo or just wait? Thanks!

## 2023-10-30 NOTE — Telephone Encounter (Signed)
Patient came by office and saying rx was sent to her pharmacy and they are charging her full price if it was shipped to our office her insurance would approve it.  Can prescription be sent to our office to pick up.   Patient not sure which medicine she is getting it is either RYBELSUS or Ozempic.   Patient asked for nurse or provider return her [phone].

## 2023-10-31 ENCOUNTER — Telehealth: Payer: Self-pay

## 2023-10-31 NOTE — Telephone Encounter (Signed)
Completed app submitted.   New encounter created 10/31/23.

## 2023-10-31 NOTE — Progress Notes (Signed)
Pharmacy Medication Assistance Program Note    11/15/2023  Patient ID: Shawna Huerta, female   DOB: 1961/10/19, 62 y.o.   MRN: [id number]     09/29/2023 10/31/2023  Outreach Medication One  Initial Outreach Date (Medication One) 09/24/2023   Manufacturer Medication One [employer] [facility] Ozempic Ozempic  Dose of Ozempic 0.5mg  0.5MG   Type of Forensic scientist Assistance  Date Application Sent to Patient 09/01/2023   Application Items Requested Application   Date Application Sent to Prescriber 09/29/2023   Date Application Received From Patient 09/26/2023   Application Items Received From Patient Application   Date Application Submitted to Manufacturer  10/31/2023  Method Application Sent to Manufacturer  Fax  Patient Assistance Determination  Approved  Approval Start Date  11/06/2023  Approval End Date  11/27/2024     Renewal; changed from Rybelsus to Ozempic for 2025 re-enrollment.

## 2023-11-08 ENCOUNTER — Telehealth: Payer: Self-pay | Admitting: Pharmacist

## 2023-11-08 NOTE — Telephone Encounter (Signed)
   Patient enrolled in the Novo Nordisk patient assistance program for Rybelsus and receive #2 bottles (60 ct).  We will transition patient to Ozempic 0.5mg  for next year 2025.  Discussed medications and instructions.  Patient is stable and doing well.  Will plan to reach out in 11/2023.    Kieth Brightly, PharmD, BCACP, CPP Clinical Pharmacist, [facility]

## 2023-11-08 NOTE — Telephone Encounter (Signed)
Patient states novo nordisk called her and said they still need the PCP Ozempic portion Just passing along! Thank you!

## 2023-11-20 ENCOUNTER — Ambulatory Visit: Payer: Medicaid Other

## 2023-11-20 VITALS — Ht 67.0 in | Wt 240.0 lb

## 2023-11-20 DIAGNOSIS — Z0001 Encounter for general adult medical examination with abnormal findings: Secondary | ICD-10-CM | POA: Diagnosis not present

## 2023-11-20 DIAGNOSIS — Z1231 Encounter for screening mammogram for malignant neoplasm of breast: Secondary | ICD-10-CM

## 2023-11-20 DIAGNOSIS — E1165 Type 2 diabetes mellitus with hyperglycemia: Secondary | ICD-10-CM

## 2023-11-20 DIAGNOSIS — Z0000 Encounter for general adult medical examination without abnormal findings: Secondary | ICD-10-CM

## 2023-11-20 NOTE — Patient Instructions (Signed)
Shawna Huerta , Thank you for taking time to come for your Medicare Wellness Visit. I appreciate your ongoing commitment to your health goals. Please review the following plan we discussed and let me know if I can assist you in the future.   Referrals/Orders/Follow-Ups/Clinician Recommendations:   Referral has been made to Nutritionist for diabetic management  You have an order for:  []   2D Mammogram  [x]   3D Mammogram  []   Bone Density    Please call for appointment:  [facility] [address]Radiology [place], [place] [address] [phone] [facility] [address] [place], [place] [address] [phone]   Make sure to wear two-piece clothing.  No lotions, powders, or deodorants the day of the appointment. Make sure to bring picture ID and insurance card.  Bring list of medications you are currently taking including any supplements.   Schedule your [place] screening mammogram through MyChart!   Log into your MyChart account.  Go to 'Visit' (or 'Appointments' if on mobile App) --> Schedule an Appointment  Under 'Select a Reason for Visit' choose the Mammogram Screening option.  Complete the pre-visit questions and select the time and place that best fits your schedule.    This is a list of the screening recommended for you and due dates:  Health Maintenance  Topic Date Due   Pap with HPV screening  Never done   Eye exam for diabetics  02/23/2023   Mammogram  11/18/2023   Yearly kidney health urinalysis for diabetes  01/19/2024   Complete foot exam   04/10/2024   Hemoglobin A1C  04/14/2024   Yearly kidney function blood test for diabetes  10/15/2024   Medicare Annual Wellness Visit  11/19/2024   Colon Cancer Screening  11/09/2025   DTaP/Tdap/Td vaccine (2 - Td or Tdap) 10/05/2033   Flu Shot  Completed   Hepatitis C Screening  Completed   HIV Screening  Completed   Zoster (Shingles) Vaccine  Completed   HPV Vaccine  Aged  Out   COVID-19 Vaccine  Discontinued    Advanced directives: (Provided) Advance directive discussed with you today. I have provided a copy for you to complete at home and have notarized. Once this is complete, please bring a copy in to our office so we can scan it into your chart.  Requested paperwork to be mailed to her home  Next Medicare Annual Wellness Visit scheduled for next year: Yes 11/27/2024 @ 10:20am televisit

## 2023-11-20 NOTE — Progress Notes (Signed)
Subjective:   Shawna Huerta is a 62 y.o. female who presents for an Initial Medicare Annual Wellness Visit.  Visit Complete: Virtual I connected with  Shawna Huerta on 11/20/23 by a audio enabled telemedicine application and verified that I am speaking with the correct person using two identifiers.  Patient Location: Home  Provider Location: Home Office  I discussed the limitations of evaluation and management by telemedicine. The patient expressed understanding and agreed to proceed.  Vital Signs: Because this visit was a virtual/telehealth visit, some criteria may be missing or patient reported. Any vitals not documented were not able to be obtained and vitals that have been documented are patient reported.  Patient Medicare AWV questionnaire was completed by the patient on 11/16/23; I have confirmed that all information answered by patient is correct and no changes since this date.  Cardiac Risk Factors include: advanced age (>62men, >72 women);diabetes mellitus;dyslipidemia;hypertension;obesity (BMI >30kg/m2)    Objective:    Today's Vitals   11/20/23 1043  Weight: 240 lb (108.9 kg)  Height: 5\' 7"  (1.702 m)  PainSc: 1    Body mass index is 37.59 kg/m.     11/20/2023   11:02 AM 01/18/2023    1:08 PM 01/17/2023    4:31 PM 10/24/2022    9:38 AM 08/03/2022    3:57 AM 08/02/2022    5:22 PM 06/17/2022    4:40 AM  Advanced Directives  Does Patient Have a Medical Advance Directive? No  No No  No No  Would patient like information on creating a medical advance directive?  No - Patient declined  Yes (ED - Information included in AVS) No - Patient declined      Current Medications (verified) Outpatient Encounter Medications as of 11/20/2023  Medication Sig   acetaminophen (TYLENOL) 325 MG tablet Take 650 mg by mouth every 6 (six) hours as needed for moderate pain.   atorvastatin (LIPITOR) 10 MG tablet Take 1 tablet (10 mg total) by mouth daily.   gabapentin (NEURONTIN) 300 MG  capsule Take 1 capsule (300 mg total) by mouth 2 (two) times daily.   glyBURIDE-metformin (GLUCOVANCE) 5-500 MG tablet Take 1 tablet by mouth daily with breakfast.   Lancets (ONETOUCH ULTRASOFT) lancets Use as instructed   metoCLOPramide (REGLAN) 5 MG tablet Take 1 tablet (5 mg total) by mouth 4 (four) times daily.   ONETOUCH VERIO test strip once daily testing dx e11.65   Semaglutide (RYBELSUS) 3 MG TABS Take 1 tablet (3 mg total) by mouth daily.   Vitamin D, Ergocalciferol, (DRISDOL) 1.25 MG (50000 UNIT) CAPS capsule Take 1 capsule (50,000 Units total) by mouth every 7 (seven) days.   No facility-administered encounter medications on file as of 11/20/2023.    Allergies (verified) Iohexol   History: Past Medical History:  Diagnosis Date   Allergy 2006   Contrast dye   Anemia    Arthritis    Bartholin cyst 07/03/2014   Colitis 08/2015   Treated at [facility]   Diabetes mellitus    Hyperlipidemia LDL goal <70 04/09/2022   Hypertension    Labial abscess 08/2015   Status post I&D by Dr. Marcha Solders   Nicotine addiction 09/03/2013   Obesity    Pancreatitis 09/2015   unknown etiology, no further work-up with gastroenterology    Tuberculosis 1970   Vaginal itching 09/03/2013   Past Surgical History:  Procedure Laterality Date   ABDOMINAL HYSTERECTOMY     ABDOMINAL SURGERY     exploratry with TAH  BALLOON DILATION N/A 10/26/2016   Procedure: Pyloric channel BALLOON DILATION;  Surgeon: Corbin Ade, MD;  Location: [facility];  Service: Endoscopy;  Laterality: N/A;   BARTHOLIN GLAND CYST EXCISION Right 08/20/2014   Procedure: EXCISION OF RIGHT BARTHOLIN'S GLAND CYST;  Surgeon: Lazaro Arms, MD;  Location: [facility];  Service: Gynecology;  Laterality: Right;   CHOLECYSTECTOMY     COLONOSCOPY  10/2015   [facility] Skene: mild diverticulosis, at least 8 polpys removed, multiple tubular adenomas. next tcs in 1 year   ESOPHAGOGASTRODUODENOSCOPY N/A 10/26/2016   Procedure:  ESOPHAGOGASTRODUODENOSCOPY (EGD);  Surgeon: Corbin Ade, MD;  Location: [facility];  Service: Endoscopy;  Laterality: N/A;   FOOT SURGERY     FOOT SURGERY Right 2011   shave bone   TONSILLECTOMY     TUBAL LIGATION  1990   Family History  Problem Relation Age of Onset   Diabetes Mother    Stroke Mother    Arthritis Mother    Cancer Father        liver and colon. Patient unsure primary    Stomach cancer Father    Diabetes Sister    Stomach cancer Sister    Liver cancer Sister    Bone cancer Sister    Diabetes Brother    Lung cancer Brother    Cancer Brother    Heart disease Son    Early death Son    Breast cancer Neg Hx    Social History   Socioeconomic History   Marital status: Single    Spouse name: Not on file   Number of children: 1   Years of education: Not on file   Highest education level: Some college, no degree  Occupational History   Not on file  Tobacco Use   Smoking status: Former    Current packs/day: 0.00    Average packs/day: 0.3 packs/day for 3.0 years (0.8 ttl pk-yrs)    Types: Cigarettes    Start date: 10/21/2013    Quit date: 10/21/2016    Years since quitting: 7.0   Smokeless tobacco: Never  Vaping Use   Vaping status: Never Used  Substance and Sexual Activity   Alcohol use: No   Drug use: Not Currently    Types: Marijuana    Comment: started using marijuana a month on the weekends to cope with depression.   Sexual activity: Not Currently    Birth control/protection: Surgical  Other Topics Concern   Not on file  Social History Narrative   Had one child now deceased   Lives with her sister, sister recently diagnosed with cancer.    Social Drivers of Corporate investment banker Strain: Low Risk  (11/20/2023)   Overall Financial Resource Strain (CARDIA)    Difficulty of Paying Living Expenses: Not hard at all  Food Insecurity: No Food Insecurity (11/20/2023)   Hunger Vital Sign    Worried About Running Out of Food in the Last  Year: Never true    Ran Out of Food in the Last Year: Never true  Transportation Needs: No Transportation Needs (11/20/2023)   PRAPARE - Administrator, Civil Service (Medical): No    Lack of Transportation (Non-Medical): No  Physical Activity: Sufficiently Active (11/20/2023)   Exercise Vital Sign    Days of Exercise per Week: 4 days    Minutes of Exercise per Session: 40 min  Stress: No Stress Concern Present (11/20/2023)   [employer] of Occupational Health - Occupational  Stress Questionnaire    Feeling of Stress : Only a little  Recent Concern: Stress - Stress Concern Present (10/09/2023)   [employer] of Occupational Health - Occupational Stress Questionnaire    Feeling of Stress : To some extent  Social Connections: Unknown (11/20/2023)   Social Connection and Isolation Panel [NHANES]    Frequency of Communication with Friends and Family: More than three times a week    Frequency of Social Gatherings with Friends and Family: Three times a week    Attends Religious Services: More than 4 times per year    Active Member of Clubs or Organizations: Yes    Attends Engineer, structural: More than 4 times per year    Marital Status: Patient declined    Tobacco Counseling Counseling given: Not Answered  Clinical Intake:  Pre-visit preparation completed: No  Pain : No/denies pain Pain Score: 1  Pain Location: Foot Pain Orientation: Right Pain Descriptors / Indicators: Aching Pain Onset: More than a month ago Pain Frequency: Intermittent Pain Relieving Factors: voltaren creme, tylenol  Pain Relieving Factors: voltaren creme, tylenol  BMI - recorded: 37.59 Nutritional Status: BMI > 30  Obese Nutritional Risks: None Diabetes: Yes CBG done?: Yes (BS 103 at home this am) CBG resulted in Enter/ Edit results?: No Did pt. bring in CBG monitor from home?: No  How often do you need to have someone help you when you read instructions, pamphlets, or  other written materials from your doctor or pharmacy?: 1 - Never  Interpreter Needed?: No  Comments: lives alone Information entered by :: B.Terrin Imparato,LPN   Activities of Daily Living    11/16/2023    4:55 PM 01/18/2023    1:00 PM  In your present state of health, do you have any difficulty performing the following activities:  Hearing? 0 0  Vision? 0 0  Difficulty concentrating or making decisions? 0 0  Walking or climbing stairs? 1 0  Dressing or bathing? 0 0  Doing errands, shopping? 0 0  Preparing Food and eating ? N   Using the Toilet? N   In the past six months, have you accidently leaked urine? N   Do you have problems with loss of bowel control? N   Managing your Medications? N   Managing your Finances? N   Housekeeping or managing your Housekeeping? N     Patient Care Team: Gilmore Laroche, FNP as PCP - General (Family Medicine)  Indicate any recent Medical Services you may have received from other than [facility] providers in the past year (date may be approximate).     Assessment:   This is a routine wellness examination for Shiara.  Hearing/Vision screen Hearing Screening - Comments:: Pt says her hearing is good Vision Screening - Comments:: Pt says her vision is alright;needs eye appt-will make it [facility]   Dolores Lory Addressed             This Visit's Progress    Patient Stated       I would like to lose 40lb this year by exercising, eating healthy snacks and foods and getting optimal amounts of sleep/rest.        Depression Screen    11/20/2023   10:49 AM 07/12/2023    9:54 AM 07/12/2023    9:42 AM 04/11/2023   10:33 AM 01/30/2023   10:57 AM 01/17/2023    3:13 PM 01/11/2023    8:56 AM  PHQ 2/9 Scores  PHQ - 2 Score 1 0  0 0 1 0 0  PHQ- 9 Score  3 0 0  0 3    Fall Risk    11/16/2023    4:55 PM 07/12/2023    9:42 AM 04/11/2023   10:33 AM 01/30/2023   10:56 AM 01/17/2023    3:13 PM  Fall Risk   Falls in the past year? 0 0 0 1 0  Number falls in past  yr: 0 0 0 1 0  Injury with Fall? 0 0 0 0 0  Risk for fall due to : No Fall Risks No Fall Risks No Fall Risks  No Fall Risks  Follow up Education provided;Falls prevention discussed Falls evaluation completed Falls evaluation completed  Falls evaluation completed    MEDICARE RISK AT HOME: Medicare Risk at Home Any stairs in or around the home?: (Patient-Rptd) Yes If so, are there any without handrails?: (Patient-Rptd) No Home free of loose throw rugs in walkways, pet beds, electrical cords, etc?: (Patient-Rptd) Yes Adequate lighting in your home to reduce risk of falls?: (Patient-Rptd) Yes Life alert?: (Patient-Rptd) No Use of a cane, walker or w/c?: (Patient-Rptd) Yes Grab bars in the bathroom?: (Patient-Rptd) No Shower chair or bench in shower?: (Patient-Rptd) Yes Elevated toilet seat or a handicapped toilet?: (Patient-Rptd) No  TIMED UP AND GO:  Was the test performed? No    Cognitive Function:        11/20/2023   11:07 AM  6CIT Screen  What Year? 0 points  What month? 0 points  What time? 0 points  Count back from 20 0 points  Months in reverse 0 points  Repeat phrase 0 points  Total Score 0 points    Immunizations Immunization History  Administered Date(s) Administered   Influenza,inj,Quad PF,6+ Mos 02/17/2022, 08/11/2022, 01/19/2023   Moderna SARS-COV2 Booster Vaccination 10/29/2020   Moderna Sars-Covid-2 Vaccination 01/30/2020, 03/03/2020, 10/01/2021   Tdap 10/06/2023   Zoster Recombinant(Shingrix) 10/03/2022, 05/01/2023    TDAP status: Up to date  Flu Vaccine status: Due, Education has been provided regarding the importance of this vaccine. Advised may receive this vaccine at local pharmacy or Health Dept. Aware to provide a copy of the vaccination record if obtained from local pharmacy or Health Dept. Verbalized acceptance and understanding.   Covid-19 vaccine status: Completed vaccines  Qualifies for Shingles Vaccine? Yes   Zostavax completed Yes    Shingrix Completed?: Yes  Screening Tests Health Maintenance  Topic Date Due   MAMMOGRAM  11/18/2023   OPHTHALMOLOGY EXAM  11/20/2023 (Originally 02/23/2023)   Cervical Cancer Screening (HPV/Pap Cotest)  11/19/2024 (Originally 07/31/1993)   Diabetic kidney evaluation - Urine ACR  01/19/2024   FOOT EXAM  04/10/2024   HEMOGLOBIN A1C  04/14/2024   Diabetic kidney evaluation - eGFR measurement  10/15/2024   Medicare Annual Wellness (AWV)  11/19/2024   Colonoscopy  11/09/2025   DTaP/Tdap/Td (2 - Td or Tdap) 10/05/2033   INFLUENZA VACCINE  Completed   Hepatitis C Screening  Completed   HIV Screening  Completed   Zoster Vaccines- Shingrix  Completed   HPV VACCINES  Aged Out   COVID-19 Vaccine  Discontinued    Health Maintenance  Health Maintenance Due  Topic Date Due   MAMMOGRAM  11/18/2023    Colorectal cancer screening: Type of screening: Colonoscopy. Completed 11/10/2015. Repeat every 10 years  Mammogram status: Ordered yes. Pt provided with contact info and advised to call to schedule appt.   Lung Cancer Screening: (Low Dose CT Chest recommended if Age 18-80 years,  20 pack-year currently smoking OR have quit w/in 15years.) does not qualify.   Lung Cancer Screening Referral: no  Additional Screening:  Hepatitis C Screening: does not qualify; Completed 06/07/2022  Vision Screening: Recommended annual ophthalmology exams for early detection of glaucoma and other disorders of the eye. Is the patient up to date with their annual eye exam?  No  Who is the provider or what is the name of the office in which the patient attends annual eye exams? [facility] If pt is not established with a provider, would they like to be referred to a provider to establish care? No .   Dental Screening: Recommended annual dental exams for proper oral hygiene  Diabetic Foot Exam: Diabetic Foot Exam: Completed 04/11/23  Community Resource Referral / Chronic Care Management: CRR required this visit?   No   CCM required this visit?  No    Plan:     I have personally reviewed and noted the following in the patient's chart:   Medical and social history Use of alcohol, tobacco or illicit drugs  Current medications and supplements including opioid prescriptions. Patient is not currently taking opioid prescriptions. Functional ability and status Nutritional status Physical activity Advanced directives List of other physicians Hospitalizations, surgeries, and ER visits in previous 12 months Vitals Screenings to include cognitive, depression, and falls Referrals and appointments  In addition, I have reviewed and discussed with patient certain preventive protocols, quality metrics, and best practice recommendations. A written personalized care plan for preventive services as well as general preventive health recommendations were provided to patient.    Sue Lush, LPN   [id number]/21/[id number]   After Visit Summary: (MyChart) Due to this being a telephonic visit, the after visit summary with patients personalized plan was offered to patient via MyChart   Nurse Notes: The patient states she is doing well. She is interested in a nutritional DM management and MMG ordered. She has no concerns or questions at this time.

## 2023-12-06 ENCOUNTER — Encounter: Payer: Self-pay | Admitting: Pharmacist

## 2023-12-06 ENCOUNTER — Other Ambulatory Visit: Payer: Self-pay | Admitting: Pharmacist

## 2023-12-06 NOTE — Telephone Encounter (Signed)
 Ozempic has not been received yet Patient on rybelsus in the meantime If you can check on! Thank you

## 2023-12-13 ENCOUNTER — Encounter: Payer: Self-pay | Admitting: Pharmacist

## 2023-12-16 ENCOUNTER — Other Ambulatory Visit: Payer: Self-pay | Admitting: Podiatry

## 2023-12-16 ENCOUNTER — Other Ambulatory Visit: Payer: Self-pay | Admitting: Family Medicine

## 2023-12-17 MED ORDER — GABAPENTIN 300 MG PO CAPS: 300.0000 mg | ORAL_CAPSULE | Freq: Two times a day (BID) | ORAL | 2 refills | Status: DC

## 2023-12-18 MED ORDER — ACETAMINOPHEN 325 MG PO TABS: 650.0000 mg | ORAL_TABLET | Freq: Four times a day (QID) | ORAL | 0 refills | Status: AC | PRN

## 2023-12-20 ENCOUNTER — Telehealth: Payer: Self-pay | Admitting: Family Medicine

## 2023-12-20 NOTE — Telephone Encounter (Signed)
Left pt voicemail  Office has received patient medication (Ozempic)

## 2023-12-21 NOTE — Telephone Encounter (Signed)
Pt picked up meds.

## 2024-01-09 DIAGNOSIS — H524 Presbyopia: Secondary | ICD-10-CM | POA: Diagnosis not present

## 2024-01-09 LAB — HM DIABETES EYE EXAM

## 2024-01-11 ENCOUNTER — Other Ambulatory Visit (HOSPITAL_COMMUNITY): Payer: Self-pay | Admitting: Family Medicine

## 2024-01-11 DIAGNOSIS — R928 Other abnormal and inconclusive findings on diagnostic imaging of breast: Secondary | ICD-10-CM

## 2024-01-15 ENCOUNTER — Encounter (HOSPITAL_COMMUNITY): Payer: Self-pay

## 2024-01-15 ENCOUNTER — Inpatient Hospital Stay (HOSPITAL_COMMUNITY): Admission: RE | Admit: 2024-01-15 | Payer: Medicare HMO | Source: Ambulatory Visit

## 2024-01-21 ENCOUNTER — Other Ambulatory Visit: Payer: Self-pay | Admitting: Family Medicine

## 2024-01-21 DIAGNOSIS — E559 Vitamin D deficiency, unspecified: Secondary | ICD-10-CM

## 2024-02-12 ENCOUNTER — Ambulatory Visit (INDEPENDENT_AMBULATORY_CARE_PROVIDER_SITE_OTHER): Payer: Medicare HMO | Admitting: Family Medicine

## 2024-02-12 ENCOUNTER — Encounter: Payer: Self-pay | Admitting: Family Medicine

## 2024-02-12 VITALS — BP 157/83 | HR 91 | Ht 67.0 in | Wt 258.1 lb

## 2024-02-12 DIAGNOSIS — Z23 Encounter for immunization: Secondary | ICD-10-CM

## 2024-02-12 DIAGNOSIS — E1169 Type 2 diabetes mellitus with other specified complication: Secondary | ICD-10-CM | POA: Diagnosis not present

## 2024-02-12 DIAGNOSIS — E1165 Type 2 diabetes mellitus with hyperglycemia: Secondary | ICD-10-CM

## 2024-02-12 DIAGNOSIS — E785 Hyperlipidemia, unspecified: Secondary | ICD-10-CM | POA: Diagnosis not present

## 2024-02-12 DIAGNOSIS — Z7984 Long term (current) use of oral hypoglycemic drugs: Secondary | ICD-10-CM

## 2024-02-12 DIAGNOSIS — M7501 Adhesive capsulitis of right shoulder: Secondary | ICD-10-CM

## 2024-02-12 DIAGNOSIS — I10 Essential (primary) hypertension: Secondary | ICD-10-CM | POA: Diagnosis not present

## 2024-02-12 HISTORY — DX: Encounter for immunization: Z23

## 2024-02-12 MED ORDER — OLMESARTAN MEDOXOMIL 20 MG PO TABS: 20.0000 mg | ORAL_TABLET | Freq: Every day | ORAL | 1 refills | Status: DC

## 2024-02-12 MED ORDER — CYCLOBENZAPRINE HCL 5 MG PO TABS: 5.0000 mg | ORAL_TABLET | Freq: Every evening | ORAL | 0 refills | Status: DC | PRN

## 2024-02-12 MED ORDER — NAPROXEN 500 MG PO TABS: 500.0000 mg | ORAL_TABLET | Freq: Two times a day (BID) | ORAL | 0 refills | Status: DC

## 2024-02-12 NOTE — Assessment & Plan Note (Signed)
 The patient was encouraged to continue taking Ozempic 0.25mg  weekly. I recommended reducing his intake of high-sugar foods and beverages and increasing physical activity to 150 minutes of moderate intensity per week, as tolerated

## 2024-02-12 NOTE — Progress Notes (Signed)
 Established Patient Office Visit  Subjective:  Patient ID: Shawna Huerta, female    DOB: 11-14-1961  Age: 63 y.o. MRN: [id number]  CC:  Chief Complaint  Patient presents with   Follow-up    4 mo DM follow up.  Rt shoulder pain. Trouble lifting arm causing popping and cracking when she moves. Causing trouble w/ personal care.  Lower back pain moving to buttock. Down middle of lower back.     HPI Shawna Huerta is a 63 y.o. female with past medical history of type II diabetes, hyperlipidemia and essential hypertension presents for f/u of  chronic medical conditions.  Type 2 Diabetes:The patient is currently on Ozempic 0.25 mg weekly and reports tolerating the medication well. No side effects reported. No recent episodes of polyuria, polyphagia, or polydipsia.  Hypertension:The patient's blood pressure is uncontrolled, and she reports elevated ambulatory readings. She is asymptomatic in the clinic.  Hyperlipidemia:She reports compliance with Atorvastatin 10 mg daily.  Adhesive Capsulitis:The patient complains of right shoulder stiffness with mild pain, rated 1-2/10. She reports difficulty performing personal care due to stiffness in the right shoulder. Symptoms began approximately four weeks ago. No recent injury, trauma, numbness, or tingling radiating down the arm reported.  Past Medical History:  Diagnosis Date   Allergy 2006   Contrast dye   Anemia    Arthritis    Bartholin cyst 07/03/2014   Colitis 08/2015   Treated at [facility]   Diabetes mellitus    Encounter for immunization 02/12/2024   Hyperlipidemia LDL goal <70 04/09/2022   Hypertension    Labial abscess 08/2015   Status post I&D by Dr. Marcha Solders   Nicotine addiction 09/03/2013   Obesity    Pancreatitis 09/2015   unknown etiology, no further work-up with gastroenterology    Tuberculosis 1970   Vaginal itching 09/03/2013    Past Surgical History:  Procedure Laterality Date   ABDOMINAL HYSTERECTOMY      ABDOMINAL SURGERY     exploratry with TAH   BALLOON DILATION N/A 10/26/2016   Procedure: Pyloric channel BALLOON DILATION;  Surgeon: Corbin Ade, MD;  Location: [facility];  Service: Endoscopy;  Laterality: N/A;   BARTHOLIN GLAND CYST EXCISION Right 08/20/2014   Procedure: EXCISION OF RIGHT BARTHOLIN'S GLAND CYST;  Surgeon: Lazaro Arms, MD;  Location: [facility];  Service: Gynecology;  Laterality: Right;   CHOLECYSTECTOMY     COLONOSCOPY  10/2015   [facility] Skene: mild diverticulosis, at least 8 polpys removed, multiple tubular adenomas. next tcs in 1 year   ESOPHAGOGASTRODUODENOSCOPY N/A 10/26/2016   Procedure: ESOPHAGOGASTRODUODENOSCOPY (EGD);  Surgeon: Corbin Ade, MD;  Location: [facility];  Service: Endoscopy;  Laterality: N/A;   FOOT SURGERY     FOOT SURGERY Right 2011   shave bone   TONSILLECTOMY     TUBAL LIGATION  1990    Family History  Problem Relation Age of Onset   Diabetes Mother    Stroke Mother    Arthritis Mother    Cancer Father        liver and colon. Patient unsure primary    Stomach cancer Father    Diabetes Sister    Stomach cancer Sister    Liver cancer Sister    Bone cancer Sister    Diabetes Brother    Lung cancer Brother    Cancer Brother    Heart disease Son    Early death Son    Breast cancer Neg Hx  Social History   Socioeconomic History   Marital status: Single    Spouse name: Not on file   Number of children: 1   Years of education: Not on file   Highest education level: Some college, no degree  Occupational History   Not on file  Tobacco Use   Smoking status: Former    Current packs/day: 0.00    Average packs/day: 0.3 packs/day for 3.0 years (0.8 ttl pk-yrs)    Types: Cigarettes    Start date: 10/21/2013    Quit date: 10/21/2016    Years since quitting: 7.3   Smokeless tobacco: Never  Vaping Use   Vaping status: Never Used  Substance and Sexual Activity   Alcohol use: No   Drug use: Not Currently    Types:  Marijuana    Comment: started using marijuana a month on the weekends to cope with depression.   Sexual activity: Not Currently    Birth control/protection: Surgical  Other Topics Concern   Not on file  Social History Narrative   Had one child now deceased   Lives with her sister, sister recently diagnosed with cancer.    Social Drivers of Health   Financial Resource Strain: Medium Risk (02/05/2024)   Overall Financial Resource Strain (CARDIA)    Difficulty of Paying Living Expenses: Somewhat hard  Food Insecurity: No Food Insecurity (02/05/2024)   Hunger Vital Sign    Worried About Running Out of Food in the Last Year: Never true    Ran Out of Food in the Last Year: Never true  Transportation Needs: No Transportation Needs (02/05/2024)   PRAPARE - Administrator, Civil Service (Medical): No    Lack of Transportation (Non-Medical): No  Physical Activity: Insufficiently Active (02/05/2024)   Exercise Vital Sign    Days of Exercise per Week: 2 days    Minutes of Exercise per Session: 30 min  Stress: Stress Concern Present (02/05/2024)   [employer] of Occupational Health - Occupational Stress Questionnaire    Feeling of Stress : To some extent  Social Connections: Moderately Integrated (02/05/2024)   Social Connection and Isolation Panel [NHANES]    Frequency of Communication with Friends and Family: More than three times a week    Frequency of Social Gatherings with Friends and Family: More than three times a week    Attends Religious Services: More than 4 times per year    Active Member of [employer] or Organizations: Yes    Attends Banker Meetings: More than 4 times per year    Marital Status: Separated  Intimate Partner Violence: Not At Risk (11/20/2023)   Humiliation, Afraid, Rape, and Kick questionnaire    Fear of Current or Ex-Partner: No    Emotionally Abused: No    Physically Abused: No    Sexually Abused: No    Outpatient Medications Prior to  Visit  Medication Sig Dispense Refill   acetaminophen (TYLENOL) 325 MG tablet Take 2 tablets (650 mg total) by mouth every 6 (six) hours as needed for moderate pain (pain score 4-6). 30 tablet 0   atorvastatin (LIPITOR) 10 MG tablet Take 1 tablet (10 mg total) by mouth daily. 90 tablet 3   gabapentin (NEURONTIN) 300 MG capsule Take 1 capsule (300 mg total) by mouth 2 (two) times daily. 60 capsule 2   Lancets (ONETOUCH ULTRASOFT) lancets Use as instructed 100 each 12   metoCLOPramide (REGLAN) 5 MG tablet Take 1 tablet (5 mg total) by mouth 4 (four)  times daily. 90 tablet 2   ONETOUCH VERIO test strip once daily testing dx e11.65 100 each 5   Vitamin D, Ergocalciferol, (DRISDOL) 1.25 MG (50000 UNIT) CAPS capsule TAKE 1 CAPSULE (50,000 UNITS TOTAL) BY MOUTH EVERY 7 (SEVEN) DAYS 12 capsule 3   glyBURIDE-metformin (GLUCOVANCE) 5-500 MG tablet Take 1 tablet by mouth daily with breakfast. 90 tablet 1   Semaglutide (RYBELSUS) 3 MG TABS Take 1 tablet (3 mg total) by mouth daily. 90 tablet 1   No facility-administered medications prior to visit.    Allergies  Allergen Reactions   Gadolinium Derivatives Other (See Comments)   Iohexol Swelling     Code: HIVES, Desc: PT STATES THE EYES SWOLL AND ITCHED, NEEDS PRE MEDS     ROS Review of Systems  Constitutional:  Negative for chills and fever.  Eyes:  Negative for visual disturbance.  Respiratory:  Negative for chest tightness and shortness of breath.   Musculoskeletal:        Right shoulder stiffness/ pain  Neurological:  Negative for dizziness and headaches.      Objective:    Physical Exam HENT:     Head: Normocephalic.     Mouth/Throat:     Mouth: Mucous membranes are moist.  Cardiovascular:     Rate and Rhythm: Normal rate.     Heart sounds: Normal heart sounds.  Pulmonary:     Effort: Pulmonary effort is normal.     Breath sounds: Normal breath sounds.  Musculoskeletal:     Right shoulder: Tenderness present. No swelling,  deformity, effusion or bony tenderness. Decreased range of motion. Normal strength.     Left shoulder: No swelling, deformity or tenderness. Normal range of motion.  Neurological:     Mental Status: She is alert.     BP (!) 157/83   Pulse 91   Ht 5\' 7"  (1.702 m)   Wt 258 lb 1.9 oz (117.1 kg)   SpO2 92%   BMI 40.43 kg/m  Wt Readings from Last 3 Encounters:  02/12/24 258 lb 1.9 oz (117.1 kg)  11/20/23 240 lb (108.9 kg)  10/12/23 246 lb 12.8 oz (111.9 kg)    Lab Results  Component Value Date   TSH 1.460 10/16/2023   Lab Results  Component Value Date   WBC 6.9 10/16/2023   HGB 10.1 (L) 10/16/2023   HCT 34.5 10/16/2023   MCV 72 (L) 10/16/2023   PLT 217 10/16/2023   Lab Results  Component Value Date   NA 139 10/16/2023   K 4.8 10/16/2023   CO2 22 10/16/2023   GLUCOSE 199 (H) 10/16/2023   BUN 15 10/16/2023   CREATININE 0.90 10/16/2023   BILITOT 0.3 10/16/2023   ALKPHOS 63 10/16/2023   AST 12 10/16/2023   ALT 15 10/16/2023   PROT 7.3 10/16/2023   ALBUMIN 4.1 10/16/2023   CALCIUM 9.7 10/16/2023   ANIONGAP 8 01/20/2023   EGFR 73 10/16/2023   Lab Results  Component Value Date   CHOL 126 10/16/2023   Lab Results  Component Value Date   HDL 62 10/16/2023   Lab Results  Component Value Date   LDLCALC 53 10/16/2023   Lab Results  Component Value Date   TRIG 49 10/16/2023   Lab Results  Component Value Date   CHOLHDL 2.0 10/16/2023   Lab Results  Component Value Date   HGBA1C 8.0 (H) 10/16/2023      Assessment & Plan:  Adhesive capsulitis of right shoulder Assessment & Plan: Will defer  prescribing prednisone given the patient is diabetic. Will initiate therapy with Naproxen 500 mg twice daily to decrease inflammation and pain. Flexeril 5 mg at bedtime is encouraged as needed for muscle relaxation. Non-pharmacological interventions discussed, including: Gentle stretching and range-of-motion exercises to improve flexibility and prevent stiffness. Heat  therapy (warm compress or heating pad) before stretching to loosen the joint. Cold therapy (ice packs) after activity to reduce inflammation and pain. Posture correction to reduce strain on the shoulder. Avoiding prolonged immobilization to prevent further stiffness. A referral has been placed to physical therapy to strengthen the shoulder and improve mobility.   Orders: -     Naproxen; Take 1 tablet (500 mg total) by mouth 2 (two) times daily with a meal.  Dispense: 30 tablet; Refill: 0 -     Cyclobenzaprine HCl; Take 1 tablet (5 mg total) by mouth at bedtime as needed for muscle spasms.  Dispense: 30 tablet; Refill: 0 -     Ambulatory referral to Physical Therapy  Essential hypertension Assessment & Plan: Uncontrolled Therapy will be initiated on all Olmesartan 20 mg daily A low-sodium diet of less than 2,300 mg daily is recommended, along with moderate-intensity physical activity for at least 150 minutes per week. The patient is encouraged to maintain these lifestyle modifications to help manage her blood pressure effectively.  Long-term considerations were discussed, emphasizing that uncontrolled hypertension increases the risk of cardiovascular diseases, including stroke, coronary artery disease, and heart failure.  The patient is encouraged to seek emergency care if blood pressure exceeds 180/120 and is accompanied by symptoms such as headaches, chest pain, palpitations, blurred vision, or dizziness. She verbalized understanding and will follow up as scheduled.   Orders: -     Olmesartan Medoxomil; Take 1 tablet (20 mg total) by mouth daily.  Dispense: 30 tablet; Refill: 1  Type 2 diabetes mellitus with hyperglycemia, without long-term current use of insulin (HCC) Assessment & Plan: The patient was encouraged to continue taking Ozempic 0.25mg  weekly. I recommended reducing his intake of high-sugar foods and beverages and increasing physical activity to 150 minutes of moderate  intensity per week, as tolerated    Hyperlipidemia associated with type 2 diabetes mellitus (HCC) Assessment & Plan: The patient was encouraged to continue taking atorvastatin 10 mg daily for cholesterol management. Lifestyle modifications were also discussed, including avoiding simple carbohydrates such as cakes, sweet desserts, ice cream, soda (diet or regular), sweet tea, candies, chips, cookies, store-bought juices, excessive alcohol (more than 1-2 drinks per day), lemonade, artificial sweeteners, donuts, coffee creamers, and sugar-free products. Additionally, the patient was advised to reduce the consumption of greasy, fatty foods and increase physical activity to support cardiovascular health. The patient verbalized understanding and is aware of the plan of care.  Lab Results  Component Value Date   CHOL 126 10/16/2023   HDL 62 10/16/2023   LDLCALC 53 10/16/2023   TRIG 49 10/16/2023   CHOLHDL 2.0 10/16/2023      Encounter for immunization Assessment & Plan: Patient educated on CDC recommendation for the vaccine. Verbal consent was obtained from the patient, vaccine administered by nurse, no sign of adverse reactions noted at this time. Patient education on arm soreness and use of tylenol or ibuprofen for this patient  was discussed. Patient educated on the signs and symptoms of adverse effect and advise to contact the office if they occur.   Orders: -     Pneumococcal conjugate vaccine 20-valent   Note: This chart has been completed using [employer] account planner  Dictation software, and while attempts have been made to ensure accuracy, certain words and phrases may not be transcribed as intended.   Follow-up: Return in about 1 month (around 03/14/2024) for BP.   Gilmore Laroche, FNP

## 2024-02-12 NOTE — Assessment & Plan Note (Signed)
 Patient educated on CDC recommendation for the vaccine. Verbal consent was obtained from the patient, vaccine administered by nurse, no sign of adverse reactions noted at this time. Patient education on arm soreness and use of tylenol or ibuprofen for this patient  was discussed. Patient educated on the signs and symptoms of adverse effect and advise to contact the office if they occur.

## 2024-02-12 NOTE — Patient Instructions (Addendum)
 I appreciate the opportunity to provide care to you today!    Follow up:  1 months for BP  Fasting Labs: please stop by the lab today/ during the week to get your blood drawn (CBC, CMP, TSH, Lipid profile, HgA1c, Vit D)  Hypertension Management  Your current blood pressure is above the target goal of <140/90 mmHg. To address this, please continue taking Olmesartan 20 mg daily   Medication Instructions: Take your blood pressure medication at the same time each day. After taking your medication, check your blood pressure at least an hour later. If your first reading is >140/90 mmHg, wait at least 10 minutes and recheck your blood pressure. Side Effects: In the initial days of therapy, you may experience dizziness or lightheadedness as your body adjusts to the lower blood pressure; this is expected. Diet and Lifestyle: Adhere to a low-sodium diet, limiting intake to less than 1500 mg daily, and increase your physical activity. Avoid over-the-counter NSAIDs such as ibuprofen and naproxen while on this medication. Hydration and Nutrition: Stay well-hydrated by drinking at least 64 ounces of water daily. Increase your servings of fruits and vegetables and avoid excessive sodium in your diet. Long-Term Considerations: Uncontrolled hypertension can increase the risk of cardiovascular diseases, including stroke, coronary artery disease, and heart failure.  Please report to the emergency department if your blood pressure exceeds 180/120 and is accompanied by symptoms such as headaches, chest pain, palpitations, blurred vision, or dizziness.   Adhesive Capsulitis (Frozen Shoulder) -Start taking Naproxen 500 mg twice daily and Flexeril 5 mg at bedtime.  I recommend non-pharmacological interventions, including:  Gentle stretching and range-of-motion exercises to improve flexibility and prevent stiffness. Physical therapy to strengthen the shoulder and improve mobility. Heat therapy (warm compress or  heating pad) before stretching to loosen the joint. Cold therapy (ice packs) after activity to reduce inflammation and pain. Posture correction to reduce strain on the shoulder. Avoiding prolonged immobilization to prevent further stiffness.   Referrals today-  Physical therapy  Attached with your AVS, you will find valuable resources for self-education. I highly recommend dedicating some time to thoroughly examine them.   Please continue to a heart-healthy diet and increase your physical activities. Try to exercise for at least five days a week.    It was a pleasure to see you and I look forward to continuing to work together on your health and well-being. Please do not hesitate to call the office if you need care or have questions about your care.  In case of emergency, please visit the Emergency Department for urgent care, or contact our clinic at [phone] to schedule an appointment. We're here to help you!   Have a wonderful day and week. With Gratitude, Gilmore Laroche MSN, FNP-BC

## 2024-02-12 NOTE — Assessment & Plan Note (Signed)
 Will defer prescribing prednisone given the patient is diabetic. Will initiate therapy with Naproxen 500 mg twice daily to decrease inflammation and pain. Flexeril 5 mg at bedtime is encouraged as needed for muscle relaxation. Non-pharmacological interventions discussed, including: Gentle stretching and range-of-motion exercises to improve flexibility and prevent stiffness. Heat therapy (warm compress or heating pad) before stretching to loosen the joint. Cold therapy (ice packs) after activity to reduce inflammation and pain. Posture correction to reduce strain on the shoulder. Avoiding prolonged immobilization to prevent further stiffness. A referral has been placed to physical therapy to strengthen the shoulder and improve mobility.

## 2024-02-12 NOTE — Assessment & Plan Note (Signed)
 The patient was encouraged to continue taking atorvastatin 10 mg daily for cholesterol management. Lifestyle modifications were also discussed, including avoiding simple carbohydrates such as cakes, sweet desserts, ice cream, soda (diet or regular), sweet tea, candies, chips, cookies, store-bought juices, excessive alcohol (more than 1-2 drinks per day), lemonade, artificial sweeteners, donuts, coffee creamers, and sugar-free products. Additionally, the patient was advised to reduce the consumption of greasy, fatty foods and increase physical activity to support cardiovascular health. The patient verbalized understanding and is aware of the plan of care.  Lab Results  Component Value Date   CHOL 126 10/16/2023   HDL 62 10/16/2023   LDLCALC 53 10/16/2023   TRIG 49 10/16/2023   CHOLHDL 2.0 10/16/2023

## 2024-02-12 NOTE — Assessment & Plan Note (Signed)
 Uncontrolled Therapy will be initiated on all Olmesartan 20 mg daily A low-sodium diet of less than 2,300 mg daily is recommended, along with moderate-intensity physical activity for at least 150 minutes per week. The patient is encouraged to maintain these lifestyle modifications to help manage her blood pressure effectively.  Long-term considerations were discussed, emphasizing that uncontrolled hypertension increases the risk of cardiovascular diseases, including stroke, coronary artery disease, and heart failure.  The patient is encouraged to seek emergency care if blood pressure exceeds 180/120 and is accompanied by symptoms such as headaches, chest pain, palpitations, blurred vision, or dizziness. She verbalized understanding and will follow up as scheduled.

## 2024-02-14 ENCOUNTER — Telehealth: Payer: Self-pay | Admitting: Family Medicine

## 2024-02-14 NOTE — Telephone Encounter (Signed)
 Copied from CRM [id number]. Topic: General - Other >> Feb 14, 2024 10:43 AM Emylou G wrote: Reason for CRM: stacy w/pt .. needs to be changed to occ therapy.Marland Kitchen its how they schedule shoulders.. pls call her if you [phone]

## 2024-02-16 ENCOUNTER — Other Ambulatory Visit: Payer: Self-pay | Admitting: Family Medicine

## 2024-02-16 DIAGNOSIS — M7501 Adhesive capsulitis of right shoulder: Secondary | ICD-10-CM

## 2024-02-16 NOTE — Telephone Encounter (Signed)
 Ref placed.

## 2024-02-20 ENCOUNTER — Ambulatory Visit (HOSPITAL_COMMUNITY): Admission: RE | Admit: 2024-02-20 | Payer: Medicare HMO | Source: Ambulatory Visit

## 2024-02-20 ENCOUNTER — Inpatient Hospital Stay (HOSPITAL_COMMUNITY): Admission: RE | Admit: 2024-02-20 | Payer: Medicare HMO | Source: Ambulatory Visit

## 2024-02-20 ENCOUNTER — Ambulatory Visit (HOSPITAL_COMMUNITY): Payer: Medicare HMO

## 2024-02-23 ENCOUNTER — Other Ambulatory Visit: Payer: Self-pay

## 2024-02-23 ENCOUNTER — Encounter (HOSPITAL_COMMUNITY): Payer: Self-pay | Admitting: Occupational Therapy

## 2024-02-23 ENCOUNTER — Ambulatory Visit (HOSPITAL_COMMUNITY): Attending: Family Medicine | Admitting: Occupational Therapy

## 2024-02-23 DIAGNOSIS — M7501 Adhesive capsulitis of right shoulder: Secondary | ICD-10-CM | POA: Diagnosis not present

## 2024-02-23 DIAGNOSIS — R29898 Other symptoms and signs involving the musculoskeletal system: Secondary | ICD-10-CM | POA: Diagnosis not present

## 2024-02-23 DIAGNOSIS — M25511 Pain in right shoulder: Secondary | ICD-10-CM | POA: Diagnosis not present

## 2024-02-23 DIAGNOSIS — M25611 Stiffness of right shoulder, not elsewhere classified: Secondary | ICD-10-CM | POA: Insufficient documentation

## 2024-02-23 NOTE — Therapy (Addendum)
 OUTPATIENT OCCUPATIONAL THERAPY ORTHO EVALUATION  Patient Name: Shawna Huerta MRN: [id number] DOB:1961/04/11, 63 y.o., female Today's Date: 02/23/2024   OCCUPATIONAL THERAPY DISCHARGE SUMMARY  Visits from Start of Care: 1  Current functional level related to goals / functional outcomes: Unknown. Pt cancelled all appointments due to financial reasons.    Remaining deficits: Unknown   Education / Equipment: HEP at evaluation   Patient agrees to discharge. Patient goals were not met. Patient is being discharged due to financial reasons..     END OF SESSION:  OT End of Session - 02/23/24 1142     Visit Number 1    Number of Visits 8    Date for OT Re-Evaluation 03/29/24    Authorization Type [employer], $30 copay    Authorization Time Period no auth required    Progress Note Due on Visit 10    OT Start Time 1020    OT Stop Time 1055    OT Time Calculation (min) 35 min    Activity Tolerance Patient tolerated treatment well    Behavior During Therapy [facility] for tasks assessed/performed             Past Medical History:  Diagnosis Date   Allergy 2006   Contrast dye   Anemia    Arthritis    Bartholin cyst 07/03/2014   Colitis 08/2015   Treated at [facility]   Diabetes mellitus    Encounter for immunization 02/12/2024   Hyperlipidemia LDL goal <70 04/09/2022   Hypertension    Labial abscess 08/2015   Status post I&D by Dr. Louanne Roussel   Nicotine addiction 09/03/2013   Obesity    Pancreatitis 09/2015   unknown etiology, no further work-up with gastroenterology    Tuberculosis 1970   Vaginal itching 09/03/2013   Past Surgical History:  Procedure Laterality Date   ABDOMINAL HYSTERECTOMY     ABDOMINAL SURGERY     exploratry with TAH   BALLOON DILATION N/A 10/26/2016   Procedure: Pyloric channel BALLOON DILATION;  Surgeon: Suzette Espy, MD;  Location[facility];  Service: Endoscopy;  Laterality: N/A;   BARTHOLIN GLAND CYST EXCISION Right 08/20/2014    Procedure: EXCISION OF RIGHT BARTHOLIN'S GLAND CYST;  Surgeon: Wendelyn Halter, MD;  Location: [facility] OR[facility];  Service: Gynecology;  Laterality: Right;   CHOLECYSTECTOMY     COLONOSCOPY  10/2015   Tanis Fan: mild diverticulosis, at least 8 polpys removed, multiple tubular adenomas. next tcs in 1 year   ESOPHAGOGASTRODUODENOSCOPY N/A 10/26/2016   Procedure: ESOPHAGOGASTRODUODENOSCOPY (EGD);  Surgeon: Suzette Espy, MD;  [facility];  Service: Endoscopy;  Laterality: N/A;   FOOT SURGERY     FOOT SURGERY Right 2011   shave bone   TONSILLECTOMY     TUBAL LIGATION  1990   Patient Active Problem List   Diagnosis Date Noted   Adhesive capsulitis of right shoulder 02/12/2024   Encounter for immunization 02/12/2024   Vasomotor symptoms due to menopause 07/12/2023   Acute renal failure (ARF) (HCC) 01/18/2023   Foot pain, left 08/11/2022   Hospital discharge follow-up 08/11/2022   Bacterial gastroenteritis 08/03/2022   Hypokalemia 08/03/2022   Hypertensive urgency 08/03/2022   Dehydration 06/17/2022   SIRS (systemic inflammatory response syndrome) (HCC) 06/17/2022   Abnormal serum level of lipase 06/17/2022   Sepsis (HCC) 06/17/2022    Class: Acute   Microcytic anemia 02/17/2022   Need for immunization against influenza 02/17/2022   Left breast mass 02/17/2022   Left  leg DVT (HCC) 11/12/2021   Abdominal pain 07/05/2019   Enteritis 07/05/2019   Colon adenomas 12/28/2016   Normocytic anemia 12/28/2016   Gastroparesis    Hyperlipidemia associated with type 2 diabetes mellitus (HCC) 10/23/2016   AKI (acute kidney injury) (HCC) 10/23/2016   IBS (irritable bowel syndrome) 10/12/2015   Acute pancreatitis 10/10/2015   Vomiting and diarrhea 10/10/2015   Nausea & vomiting 10/10/2015   Type 2 diabetes mellitus with hyperglycemia (HCC) 10/10/2015   Hyponatremia 10/10/2015   Essential hypertension 10/10/2015   Obesity 09/03/2013   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    TENOSYNOVITIS OF FOOT AND ANKLE 09/22/2010    PCP: Gloria Zarwolo, FNP REFERRING PROVIDER: Gloria Zarwolo, FNP  ONSET DATE: 01/27/24  REFERRING DIAG: M75.01 (ICD-10-CM) - Adhesive capsulitis of right shoulder   THERAPY DIAG:  Acute pain of right shoulder  Stiffness of right shoulder, not elsewhere classified  Other symptoms and signs involving the musculoskeletal system  Rationale for Evaluation and Treatment: Rehabilitation  SUBJECTIVE:   SUBJECTIVE STATEMENT: S: It's been hurting for about a month. Pt accompanied by: self  PERTINENT HISTORY: Pt is a 63 y/o female presenting with right shoulder pain with possible dx of adhesive capsulitis. Pain onset was approximately 1 month ago. Pt has not received any imaging at this time.   PRECAUTIONS: None  WEIGHT BEARING RESTRICTIONS: No  PAIN:  Are you having pain? Yes: NPRS scale: 1/10 Pain location: anterior shoulder radiating to bicep Pain description: aching Aggravating factors: a lot use Relieving factors: pain medication  FALLS: Has patient fallen in last 6 months? No  PLOF: Independent  PATIENT GOALS: To have less pain  NEXT MD VISIT: 04/04/24  OBJECTIVE:   HAND DOMINANCE: Right  ADLs: Overall ADLs: Pt reports difficulty with dressing-donning shirts, bathing, peri-care, reaching overhead and behind back. Pt reports shoulder is beginning to bother her when trying to sleep.   FUNCTIONAL OUTCOME MEASURES: Upper Extremity Functional Scale (UEFS): 56/80=70%  UPPER EXTREMITY ROM:       Assessed in sitting, er/IR adducted  Active ROM Right eval  Shoulder flexion 99  Shoulder abduction 105  Shoulder internal rotation 90  Shoulder external rotation 53  (Blank rows = not tested)    UPPER EXTREMITY MMT:     Assessed in sitting, er/IR adducted  MMT Left eval  Shoulder flexion 4/5  Shoulder abduction 4/5  Shoulder internal rotation 5/5  Shoulder external rotation 4/5  (Blank rows = not tested)  HAND  FUNCTION: Grip strength: Right: 45 lbs; Left: 60 lbs  COGNITION: Overall cognitive status: Within functional limits for tasks assessed  OBSERVATIONS: mod fascial restrictions along right upper arm and anterior shoulder, large trigger point in upper trapezius, mod fascial restrictions along scapula   TODAY'S TREATMENT:                                                                                                                              DATE:  Eval:  -  Scapular A/ROM: row, extension, elevation/depression, 10 reps -Table slides: flexion, abduction, 10 reps    PATIENT EDUCATION: Education details: scapular A/ROM, table slides; AE-toilet wand Person educated: Patient Education method: Explanation, Demonstration, and Handouts Education comprehension: verbalized understanding and returned demonstration  HOME EXERCISE PROGRAM: Eval: Scapular A/ROM, table slides  GOALS: Goals reviewed with patient? Yes   SHORT TERM GOALS: Target date: 03/24/24  Pt will be provided with and educated on HEP to improve mobility in RUE required for use during ADL completion.   Goal status: INITIAL  2.  Pt will increase RUE A/ROM by 50+ degrees to improve ability to use RUE during dressing tasks with minimal compensatory techniques.   Goal status: INITIAL  3.  Pt will increase RUE strength to 4+/5 to improve ability to lift or carry items during meal preparation/housework/yardwork tasks.   Goal status: INITIAL  4.  Pt will decrease pain in RUE to 3/10 or less to improve ability to sleep for 2+ consecutive hours without waking due to pain.   Goal status: INITIAL  5.  Pt will decrease RUE fascial restrictions to min amounts or less to improve mobility required for functional reaching tasks.   Goal status: INITIAL   ASSESSMENT:  CLINICAL IMPRESSION: Patient is a 63 y.o. female who was seen today for occupational therapy evaluation for right shoulder pain, possible adhesive capsulitis. Pt  presents with increased pain and fascial restrictions, decreased ROM, strength, and functional use of the RUE. Pt reports worsening pain over the past month, tasks at waist level are ok but anything above chest height, overhead reaching, or reaching back are very difficult.    PERFORMANCE DEFICITS: in functional skills including in functional skills including ADLs, IADLs, coordination, tone, ROM, strength, pain, fascial restrictions, muscle spasms, and UE functional use  IMPAIRMENTS: are limiting patient from ADLs, IADLs, rest and sleep, and leisure.   COMORBIDITIES: has no other co-morbidities that affects occupational performance. Patient will benefit from skilled OT to address above impairments and improve overall function.  MODIFICATION OR ASSISTANCE TO COMPLETE EVALUATION: No modification of tasks or assist necessary to complete an evaluation.  OT OCCUPATIONAL PROFILE AND HISTORY: Problem focused assessment: Including review of records relating to presenting problem.  CLINICAL DECISION MAKING: LOW - limited treatment options, no task modification necessary  REHAB POTENTIAL: Good  EVALUATION COMPLEXITY: Low      PLAN:  OT FREQUENCY: 2x/week  OT DURATION: 4 weeks  PLANNED INTERVENTIONS: 97168 OT Re-evaluation, 97535 self care/ADL training, 16109 therapeutic exercise, 97530 therapeutic activity, 97112 neuromuscular re-education, 97140 manual therapy, 97035 ultrasound, 97014 electrical stimulation unattended, patient/family education, and DME and/or AE instructions  RECOMMENDED OTHER SERVICES: None at this time  CONSULTED AND AGREED WITH PLAN OF CARE: Patient  PLAN FOR NEXT SESSION: Follow up on HEP, initiate manual techniques, P/ROM, AA/ROM   Lafonda Piety, OTR/L  [phone] 02/23/2024, 11:43 AM

## 2024-02-23 NOTE — Patient Instructions (Signed)
 Perform Exercises 2x/day, 10X each   1) Seated Row   Sit up straight with elbows by your sides. Pull back with shoulders/elbows, keeping forearms straight, as if pulling back on the reins of a horse. Squeeze shoulder blades together. Repeat _10-15__times, __2-3__sets/day    2) Shoulder Elevation    Sit up straight with arms by your sides. Slowly bring your shoulders up towards your ears. Repeat_10-15__times, __2-3__ sets/day    3) Shoulder Extension    Sit up straight with both arms by your side, draw your arms back behind your waist. Keep your elbows straight. Repeat __10-15__times, __2-3__sets/day.      4) SHOULDER: Flexion On Table   Place hands on towel placed on table, elbows straight. Lean forward with you upper body, pushing towel away from body.   5) Abduction (Passive)   With arm out to side, resting on towel placed on table with palm DOWN, keeping trunk away from table, lean to the side while pushing towel away from body.

## 2024-02-29 ENCOUNTER — Ambulatory Visit (HOSPITAL_COMMUNITY)
Admission: RE | Admit: 2024-02-29 | Discharge: 2024-02-29 | Disposition: A | Discharge status: Home / Self Care | Source: Ambulatory Visit | Attending: Family Medicine | Admitting: Family Medicine

## 2024-02-29 ENCOUNTER — Encounter (HOSPITAL_COMMUNITY): Payer: Self-pay

## 2024-02-29 DIAGNOSIS — R928 Other abnormal and inconclusive findings on diagnostic imaging of breast: Secondary | ICD-10-CM | POA: Insufficient documentation

## 2024-02-29 DIAGNOSIS — R92313 Mammographic fatty tissue density, bilateral breasts: Secondary | ICD-10-CM | POA: Diagnosis not present

## 2024-03-08 ENCOUNTER — Encounter (HOSPITAL_COMMUNITY): Admitting: Occupational Therapy

## 2024-03-11 ENCOUNTER — Other Ambulatory Visit: Payer: Self-pay | Admitting: Family Medicine

## 2024-03-11 DIAGNOSIS — M7501 Adhesive capsulitis of right shoulder: Secondary | ICD-10-CM

## 2024-03-11 MED ORDER — NAPROXEN 500 MG PO TABS: 500.0000 mg | ORAL_TABLET | Freq: Two times a day (BID) | ORAL | 0 refills | Status: DC

## 2024-03-11 MED ORDER — CYCLOBENZAPRINE HCL 5 MG PO TABS: 5.0000 mg | ORAL_TABLET | Freq: Every evening | ORAL | 0 refills | Status: DC | PRN

## 2024-03-13 ENCOUNTER — Encounter (HOSPITAL_COMMUNITY): Admitting: Occupational Therapy

## 2024-03-15 ENCOUNTER — Encounter (HOSPITAL_COMMUNITY): Admitting: Occupational Therapy

## 2024-03-18 ENCOUNTER — Encounter (HOSPITAL_COMMUNITY): Admitting: Occupational Therapy

## 2024-03-20 ENCOUNTER — Encounter (HOSPITAL_COMMUNITY): Admitting: Occupational Therapy

## 2024-03-27 ENCOUNTER — Encounter (HOSPITAL_COMMUNITY): Admitting: Occupational Therapy

## 2024-03-29 ENCOUNTER — Encounter (HOSPITAL_COMMUNITY): Admitting: Occupational Therapy

## 2024-04-02 ENCOUNTER — Encounter (HOSPITAL_COMMUNITY): Admitting: Occupational Therapy

## 2024-04-04 ENCOUNTER — Ambulatory Visit (INDEPENDENT_AMBULATORY_CARE_PROVIDER_SITE_OTHER): Admitting: Family Medicine

## 2024-04-04 ENCOUNTER — Encounter: Payer: Self-pay | Admitting: Family Medicine

## 2024-04-04 VITALS — BP 150/80 | HR 98 | Ht 67.0 in | Wt 258.1 lb

## 2024-04-04 DIAGNOSIS — E038 Other specified hypothyroidism: Secondary | ICD-10-CM | POA: Diagnosis not present

## 2024-04-04 DIAGNOSIS — E1169 Type 2 diabetes mellitus with other specified complication: Secondary | ICD-10-CM

## 2024-04-04 DIAGNOSIS — Z7985 Long-term (current) use of injectable non-insulin antidiabetic drugs: Secondary | ICD-10-CM

## 2024-04-04 DIAGNOSIS — M7501 Adhesive capsulitis of right shoulder: Secondary | ICD-10-CM | POA: Diagnosis not present

## 2024-04-04 DIAGNOSIS — E1165 Type 2 diabetes mellitus with hyperglycemia: Secondary | ICD-10-CM | POA: Diagnosis not present

## 2024-04-04 DIAGNOSIS — E785 Hyperlipidemia, unspecified: Secondary | ICD-10-CM | POA: Diagnosis not present

## 2024-04-04 DIAGNOSIS — E1159 Type 2 diabetes mellitus with other circulatory complications: Secondary | ICD-10-CM

## 2024-04-04 DIAGNOSIS — E559 Vitamin D deficiency, unspecified: Secondary | ICD-10-CM | POA: Diagnosis not present

## 2024-04-04 DIAGNOSIS — I10 Essential (primary) hypertension: Secondary | ICD-10-CM

## 2024-04-04 MED ORDER — CYCLOBENZAPRINE HCL 5 MG PO TABS: 5.0000 mg | ORAL_TABLET | Freq: Every evening | ORAL | 0 refills | Status: DC | PRN

## 2024-04-04 MED ORDER — OLMESARTAN MEDOXOMIL 20 MG PO TABS: 20.0000 mg | ORAL_TABLET | Freq: Every day | ORAL | 1 refills | Status: DC

## 2024-04-04 MED ORDER — NAPROXEN 500 MG PO TABS: 500.0000 mg | ORAL_TABLET | Freq: Two times a day (BID) | ORAL | 0 refills | Status: DC

## 2024-04-04 MED ORDER — METFORMIN HCL 500 MG PO TABS: 500.0000 mg | ORAL_TABLET | Freq: Two times a day (BID) | ORAL | 3 refills | Status: DC

## 2024-04-04 NOTE — Progress Notes (Signed)
 Established Patient Office Visit  Subjective:  Patient ID: Shawna Huerta, female    DOB: 1961/09/17  Age: 63 y.o. MRN: [id number]  CC:  Chief Complaint  Patient presents with   Medical Management of Chronic Issues    1 month B/P check  Seeing readings of 136/84 at home but did not bring log today  Pt has concerns of glucose readings while taking Ozempic .     HPI Shawna Huerta is a 63 y.o. female with past medical history of Hypertension presents for BP f/u.  For the details of today's visit, please refer to the assessment and plan.     Past Medical History:  Diagnosis Date   Allergy 2006   Contrast dye   Anemia    Arthritis    Bartholin cyst 07/03/2014   Colitis 08/2015   Treated at [facility]   Diabetes mellitus    Encounter for immunization 02/12/2024   Hyperlipidemia LDL goal <70 04/09/2022   Hypertension    Labial abscess 08/2015   Status post I&D by Dr. Louanne Roussel   Nicotine  addiction 09/03/2013   Obesity    Pancreatitis 09/2015   unknown etiology, no further work-up with gastroenterology    Tuberculosis 1970   Vaginal itching 09/03/2013    Past Surgical History:  Procedure Laterality Date   ABDOMINAL HYSTERECTOMY     ABDOMINAL SURGERY     exploratry with TAH   BALLOON DILATION N/A 10/26/2016   Procedure: Pyloric channel BALLOON DILATION;  Surgeon: Suzette Espy, MD;  Location[facility];  Service: Endoscopy;  Laterality: N/A;   BARTHOLIN GLAND CYST EXCISION Right 08/20/2014   Procedure: EXCISION OF RIGHT BARTHOLIN'S GLAND CYST;  Surgeon: Wendelyn Halter, MD;  Location: [facility];  Service: Gynecology;  Laterality: Right;   CHOLECYSTECTOMY     COLONOSCOPY  10/2015   Tanis Fan: mild diverticulosis, at least 8 polpys removed, multiple tubular adenomas. next tcs in 1 year   ESOPHAGOGASTRODUODENOSCOPY N/A 10/26/2016   Procedure: ESOPHAGOGASTRODUODENOSCOPY (EGD);  Surgeon: Suzette Espy, MD;  [facility];  Service: Endoscopy;  Laterality:  N/A;   FOOT SURGERY     FOOT SURGERY Right 2011   shave bone   TONSILLECTOMY     TUBAL LIGATION  1990    Family History  Problem Relation Age of Onset   Diabetes Mother    Stroke Mother    Arthritis Mother    Cancer Father        liver and colon. Patient unsure primary    Stomach cancer Father    Diabetes Sister    Breast cancer Sister    Stomach cancer Sister    Liver cancer Sister    Bone cancer Sister    Diabetes Brother    Lung cancer Brother    Cancer Brother    Heart disease Son    Early death Son     Social History   Socioeconomic History   Marital status: Single    Spouse name: Not on file   Number of children: 1   Years of education: Not on file   Highest education level: Some college, no degree  Occupational History   Not on file  Tobacco Use   Smoking status: Former    Current packs/day: 0.00    Average packs/day: 0.3 packs/day for 3.0 years (0.8 ttl pk-yrs)    Types: Cigarettes    Start date: 10/21/2013    Quit date: 10/21/2016    Years since quitting:  7.4   Smokeless tobacco: Never  Vaping Use   Vaping status: Never Used  Substance and Sexual Activity   Alcohol use: No   Drug use: Not Currently    Types: Marijuana    Comment: started using marijuana a month on the weekends to cope with depression.   Sexual activity: Not Currently    Birth control/protection: Surgical  Other Topics Concern   Not on file  Social History Narrative   Had one child now deceased   Lives with her sister, sister recently diagnosed with cancer.    Social Drivers of Health   Financial Resource Strain: Medium Risk (02/05/2024)   Overall Financial Resource Strain (CARDIA)    Difficulty of Paying Living Expenses: Somewhat hard  Food Insecurity: No Food Insecurity (02/05/2024)   Hunger Vital Sign    Worried About Running Out of Food in the Last Year: Never true    Ran Out of Food in the Last Year: Never true  Transportation Needs: No Transportation Needs (02/05/2024)    PRAPARE - Administrator, Civil Service (Medical): No    Lack of Transportation (Non-Medical): No  Physical Activity: Insufficiently Active (02/05/2024)   Exercise Vital Sign    Days of Exercise per Week: 2 days    Minutes of Exercise per Session: 30 min  Stress: Stress Concern Present (02/05/2024)   [employer] of Occupational Health - Occupational Stress Questionnaire    Feeling of Stress : To some extent  Social Connections: Moderately Integrated (02/05/2024)   Social Connection and Isolation Panel [NHANES]    Frequency of Communication with Friends and Family: More than three times a week    Frequency of Social Gatherings with Friends and Family: More than three times a week    Attends Religious Services: More than 4 times per year    Active Member of [employer] or Organizations: Yes    Attends Banker Meetings: More than 4 times per year    Marital Status: Separated  Intimate Partner Violence: Not At Risk (11/20/2023)   Humiliation, Afraid, Rape, and Kick questionnaire    Fear of Current or Ex-Partner: No    Emotionally Abused: No    Physically Abused: No    Sexually Abused: No    Outpatient Medications Prior to Visit  Medication Sig Dispense Refill   acetaminophen  (TYLENOL ) 325 MG tablet Take 2 tablets (650 mg total) by mouth every 6 (six) hours as needed for moderate pain (pain score 4-6). 30 tablet 0   atorvastatin  (LIPITOR) 10 MG tablet Take 1 tablet (10 mg total) by mouth daily. 90 tablet 3   gabapentin  (NEURONTIN ) 300 MG capsule Take 1 capsule (300 mg total) by mouth 2 (two) times daily. 60 capsule 2   Lancets (ONETOUCH ULTRASOFT) lancets Use as instructed 100 each 12   metoCLOPramide  (REGLAN ) 5 MG tablet Take 1 tablet (5 mg total) by mouth 4 (four) times daily. 90 tablet 2   ONETOUCH VERIO test strip once daily testing dx e11.65 100 each 5   Vitamin D , Ergocalciferol , (DRISDOL ) 1.25 MG (50000 UNIT) CAPS capsule TAKE 1 CAPSULE (50,000 UNITS TOTAL)  BY MOUTH EVERY 7 (SEVEN) DAYS 12 capsule 3   cyclobenzaprine  (FLEXERIL ) 5 MG tablet Take 1 tablet (5 mg total) by mouth at bedtime as needed for muscle spasms. 30 tablet 0   naproxen  (NAPROSYN ) 500 MG tablet Take 1 tablet (500 mg total) by mouth 2 (two) times daily with a meal. 30 tablet 0   olmesartan  (BENICAR )  20 MG tablet Take 1 tablet (20 mg total) by mouth daily. 30 tablet 1   No facility-administered medications prior to visit.    Allergies  Allergen Reactions   Gadolinium Derivatives Other (See Comments)   Iohexol  Swelling     Code: HIVES, Desc: PT STATES THE EYES SWOLL AND ITCHED, NEEDS PRE MEDS     ROS Review of Systems  Constitutional:  Negative for chills and fever.  Eyes:  Negative for visual disturbance.  Respiratory:  Negative for chest tightness and shortness of breath.   Neurological:  Negative for dizziness and headaches.      Objective:     Physical Exam HENT:     Head: Normocephalic.     Mouth/Throat:     Mouth: Mucous membranes are moist.  Cardiovascular:     Rate and Rhythm: Normal rate.     Heart sounds: Normal heart sounds.  Pulmonary:     Effort: Pulmonary effort is normal.     Breath sounds: Normal breath sounds.  Neurological:     Mental Status: She is alert.     BP (!) 150/80   Pulse 98   Ht 5\' 7"  (1.702 m)   Wt 258 lb 1.9 oz (117.1 kg)   SpO2 94%   BMI 40.43 kg/m  Wt Readings from Last 3 Encounters:  04/04/24 258 lb 1.9 oz (117.1 kg)  02/12/24 258 lb 1.9 oz (117.1 kg)  11/20/23 240 lb (108.9 kg)    Lab Results  Component Value Date   TSH 1.430 04/04/2024   Lab Results  Component Value Date   WBC 9.1 04/04/2024   HGB 10.9 (L) 04/04/2024   HCT 36.6 04/04/2024   MCV 71 (L) 04/04/2024   PLT 279 04/04/2024   Lab Results  Component Value Date   NA 140 04/04/2024   K 5.0 04/04/2024   CO2 20 04/04/2024   GLUCOSE 158 (H) 04/04/2024   BUN 15 04/04/2024   CREATININE 0.92 04/04/2024   BILITOT 0.2 04/04/2024   ALKPHOS 70  04/04/2024   AST 13 04/04/2024   ALT 14 04/04/2024   PROT 7.5 04/04/2024   ALBUMIN 4.4 04/04/2024   CALCIUM  9.6 04/04/2024   ANIONGAP 8 01/20/2023   EGFR 71 04/04/2024   Lab Results  Component Value Date   CHOL 104 04/04/2024   Lab Results  Component Value Date   HDL 45 04/04/2024   Lab Results  Component Value Date   LDLCALC 45 04/04/2024   Lab Results  Component Value Date   TRIG 62 04/04/2024   Lab Results  Component Value Date   CHOLHDL 2.3 04/04/2024   Lab Results  Component Value Date   HGBA1C 9.9 (H) 04/04/2024      Assessment & Plan:  Essential hypertension Assessment & Plan: Blood pressure was elevated during today's clinic visit; however, the patient reports that her ambulatory readings are consistently less than 140/90. She is currently asymptomatic. No changes will be made to her current antihypertensive regimen at this time. The patient was encouraged to continue taking Olmesartan  20 mg daily as prescribed.  A low-sodium diet of less than 2,300 mg daily is recommended, along with moderate-intensity physical activity for at least 150 minutes per week. The patient is encouraged to maintain these lifestyle modifications to help manage her blood pressure effectively.  Long-term considerations were discussed, emphasizing that uncontrolled hypertension increases the risk of cardiovascular diseases, including stroke, coronary artery disease, and heart failure.  The patient is encouraged to seek emergency care  if blood pressure exceeds 180/120 and is accompanied by symptoms such as headaches, chest pain, palpitations, blurred vision, or dizziness. She verbalized understanding and will follow up as scheduled.   Orders: -     Olmesartan  Medoxomil; Take 1 tablet (20 mg total) by mouth daily.  Dispense: 90 tablet; Refill: 1  Type 2 diabetes mellitus with hyperglycemia, without long-term current use of insulin  [facility]) Assessment & Plan: The patient reports elevated  glucose readings despite current therapy with Ozempic . She was encouraged to continue implementing lifestyle modifications, including dietary changes and increased physical activity. Metformin  therapy will be initiated, and her hemoglobin A1c will be assessed today to guide further management.  Alternatives, risks, benefits, and potential side effects of treatment were discussed in detail. Informed consent was obtained for all prescribed medications and treatment. The patient was actively involved in the decision-making process and in planning her care.   Orders: -     Hemoglobin A1c -     metFORMIN  HCl; Take 1 tablet (500 mg total) by mouth 2 (two) times daily with a meal.  Dispense: 180 tablet; Refill: 3  Adhesive capsulitis of right shoulder -     Cyclobenzaprine  HCl; Take 1 tablet (5 mg total) by mouth at bedtime as needed for muscle spasms.  Dispense: 30 tablet; Refill: 0 -     Naproxen ; Take 1 tablet (500 mg total) by mouth 2 (two) times daily with a meal.  Dispense: 30 tablet; Refill: 0  Hyperlipidemia associated with type 2 diabetes mellitus (HCC) -     Lipid panel -     CMP14+EGFR -     CBC with Differential/Platelet  Vitamin D  deficiency -     VITAMIN D  25 Hydroxy (Vit-D Deficiency, Fractures)  TSH (thyroid -stimulating hormone deficiency) -     TSH + free T4   Note: This chart has been completed using Engineer, civil (consulting) software, and while attempts have been made to ensure accuracy, certain words and phrases may not be transcribed as intended.   Follow-up: Return in about 4 months (around 08/05/2024).   Lavonte Palos, FNP

## 2024-04-04 NOTE — Patient Instructions (Signed)
 I appreciate the opportunity to provide care to you today!    Follow up:  4 months  Labs: please stop by the lab today to get your blood drawn (CBC, CMP, TSH, Lipid profile, HgA1c, Vit D)  Goal blood sugars:  Fasting blood sugars: 80-130 Before lunch and dinner: less than 140 2 hours after meals: less than 180    Please seek urgent care for blood glucose greater than 300 and symptoms of diabetic ketoacidosis such as nausea, vomiting, abdominal pain, confusion, fruity smelling breath, or rapid breathing.  Referrals today-   Attached with your AVS, you will find valuable resources for self-education. I highly recommend dedicating some time to thoroughly examine them.   Please continue to a heart-healthy diet and increase your physical activities. Try to exercise for at least five days a week.    It was a pleasure to see you and I look forward to continuing to work together on your health and well-being. Please do not hesitate to call the office if you need care or have questions about your care.  In case of emergency, please visit the Emergency Department for urgent care, or contact our clinic at [phone] to schedule an appointment. We're here to help you!   Have a wonderful day and week. With Gratitude, Williom Cedar MSN, FNP-BC

## 2024-04-05 ENCOUNTER — Encounter (HOSPITAL_COMMUNITY): Admitting: Occupational Therapy

## 2024-04-05 LAB — CMP14+EGFR
ALT: 14 IU/L (ref 0–32)
AST: 13 IU/L (ref 0–40)
Albumin: 4.4 g/dL (ref 3.8–4.9)
Alkaline Phosphatase: 70 IU/L (ref 44–121)
BUN/Creatinine Ratio: 16 (ref 12–28)
BUN: 15 mg/dL (ref 8–27)
Bilirubin Total: 0.2 mg/dL (ref 0.0–1.2)
CO2: 20 mmol/L (ref 20–29)
Calcium: 9.6 mg/dL (ref 8.7–10.3)
Chloride: 104 mmol/L (ref 96–106)
Creatinine, Ser: 0.92 mg/dL (ref 0.57–1.00)
Globulin, Total: 3.1 g/dL (ref 1.5–4.5)
Glucose: 158 mg/dL — ABNORMAL HIGH (ref 70–99)
Potassium: 5 mmol/L (ref 3.5–5.2)
Sodium: 140 mmol/L (ref 134–144)
Total Protein: 7.5 g/dL (ref 6.0–8.5)
eGFR: 71 mL/min/{1.73_m2} (ref >59)

## 2024-04-05 LAB — LIPID PANEL
Chol/HDL Ratio: 2.3 ratio (ref 0.0–4.4)
Cholesterol, Total: 104 mg/dL (ref 100–199)
HDL: 45 mg/dL (ref >39)
LDL Chol Calc (NIH): 45 mg/dL (ref 0–99)
Triglycerides: 62 mg/dL (ref 0–149)
VLDL Cholesterol Cal: 14 mg/dL (ref 5–40)

## 2024-04-05 LAB — CBC WITH DIFFERENTIAL/PLATELET
Basophils Absolute: 0 10*3/uL (ref 0.0–0.2)
Basos: 0 %
EOS (ABSOLUTE): 0.1 10*3/uL (ref 0.0–0.4)
Eos: 1 %
Hematocrit: 36.6 % (ref 34.0–46.6)
Hemoglobin: 10.9 g/dL — ABNORMAL LOW (ref 11.1–15.9)
Immature Grans (Abs): 0 10*3/uL (ref 0.0–0.1)
Immature Granulocytes: 0 %
Lymphocytes Absolute: 2.9 10*3/uL (ref 0.7–3.1)
Lymphs: 32 %
MCH: 21.2 pg — ABNORMAL LOW (ref 26.6–33.0)
MCHC: 29.8 g/dL — ABNORMAL LOW (ref 31.5–35.7)
MCV: 71 fL — ABNORMAL LOW (ref 79–97)
Monocytes Absolute: 0.5 10*3/uL (ref 0.1–0.9)
Monocytes: 6 %
Neutrophils Absolute: 5.5 10*3/uL (ref 1.4–7.0)
Neutrophils: 61 %
Platelets: 279 10*3/uL (ref 150–450)
RBC: 5.13 x10E6/uL (ref 3.77–5.28)
RDW: 18.2 % — ABNORMAL HIGH (ref 11.7–15.4)
WBC: 9.1 10*3/uL (ref 3.4–10.8)

## 2024-04-05 LAB — VITAMIN D 25 HYDROXY (VIT D DEFICIENCY, FRACTURES): Vit D, 25-Hydroxy: 44.2 ng/mL (ref 30.0–100.0)

## 2024-04-05 LAB — HEMOGLOBIN A1C
Est. average glucose Bld gHb Est-mCnc: 237 mg/dL
Hgb A1c MFr Bld: 9.9 % — ABNORMAL HIGH (ref 4.8–5.6)

## 2024-04-05 LAB — TSH+FREE T4
Free T4: 1.29 ng/dL (ref 0.82–1.77)
TSH: 1.43 u[IU]/mL (ref 0.450–4.500)

## 2024-04-09 NOTE — Assessment & Plan Note (Signed)
 The patient reports elevated glucose readings despite current therapy with Ozempic . She was encouraged to continue implementing lifestyle modifications, including dietary changes and increased physical activity. Metformin  therapy will be initiated, and her hemoglobin A1c will be assessed today to guide further management.  Alternatives, risks, benefits, and potential side effects of treatment were discussed in detail. Informed consent was obtained for all prescribed medications and treatment. The patient was actively involved in the decision-making process and in planning her care.

## 2024-04-09 NOTE — Assessment & Plan Note (Signed)
 Blood pressure was elevated during today's clinic visit; however, the patient reports that her ambulatory readings are consistently less than 140/90. She is currently asymptomatic. No changes will be made to her current antihypertensive regimen at this time. The patient was encouraged to continue taking Olmesartan  20 mg daily as prescribed.  A low-sodium diet of less than 2,300 mg daily is recommended, along with moderate-intensity physical activity for at least 150 minutes per week. The patient is encouraged to maintain these lifestyle modifications to help manage her blood pressure effectively.  Long-term considerations were discussed, emphasizing that uncontrolled hypertension increases the risk of cardiovascular diseases, including stroke, coronary artery disease, and heart failure.  The patient is encouraged to seek emergency care if blood pressure exceeds 180/120 and is accompanied by symptoms such as headaches, chest pain, palpitations, blurred vision, or dizziness. She verbalized understanding and will follow up as scheduled.

## 2024-04-11 ENCOUNTER — Other Ambulatory Visit: Payer: Self-pay | Admitting: Family Medicine

## 2024-04-11 DIAGNOSIS — E785 Hyperlipidemia, unspecified: Secondary | ICD-10-CM

## 2024-04-19 ENCOUNTER — Ambulatory Visit: Payer: Self-pay | Admitting: Family Medicine

## 2024-04-19 DIAGNOSIS — E1165 Type 2 diabetes mellitus with hyperglycemia: Secondary | ICD-10-CM

## 2024-04-19 MED ORDER — METFORMIN HCL 1000 MG PO TABS: 1000.0000 mg | ORAL_TABLET | Freq: Two times a day (BID) | ORAL | 3 refills | Status: AC

## 2024-04-19 MED ORDER — OZEMPIC (0.25 OR 0.5 MG/DOSE) 2 MG/3ML ~~LOC~~ SOPN
0.2500 mg | PEN_INJECTOR | SUBCUTANEOUS | 0 refills | Status: AC
Start: 2024-04-19 — End: ?

## 2024-05-13 ENCOUNTER — Telehealth: Payer: Self-pay | Admitting: Family Medicine

## 2024-05-13 NOTE — Telephone Encounter (Signed)
 LVM for pt to call and schedule DRS

## 2024-05-15 ENCOUNTER — Telehealth: Payer: Self-pay

## 2024-05-31 ENCOUNTER — Other Ambulatory Visit: Payer: Self-pay | Admitting: Family Medicine

## 2024-05-31 DIAGNOSIS — K3184 Gastroparesis: Secondary | ICD-10-CM

## 2024-06-03 NOTE — Telephone Encounter (Signed)
Faxed for records

## 2024-06-10 ENCOUNTER — Telehealth: Payer: Self-pay | Admitting: Family Medicine

## 2024-06-10 DIAGNOSIS — M7501 Adhesive capsulitis of right shoulder: Secondary | ICD-10-CM

## 2024-06-10 NOTE — Telephone Encounter (Unsigned)
 Copied from CRM [id number]. Topic: Clinical - Medication Refill >> Jun 10, 2024  4:11 PM Selinda RAMAN wrote: Medication: naproxen  (NAPROSYN ) 500 MG tablet, cyclobenzaprine  (FLEXERIL ) 5 MG tablet  Has the patient contacted their pharmacy? Yes   This is the patient's preferred pharmacy:  [id number] - [place], [place] - [address] AT [facility] [address] [place] [address] Phone: [phone] Fax: [phone]  Is this the correct pharmacy for this prescription? Yes If no, delete pharmacy and type the correct one.   Has the prescription been filled recently? No  Is the patient out of the medication? Yes she is out of both medications  Has the patient been seen for an appointment in the last year OR does the patient have an upcoming appointment? Yes  Can we respond through MyChart? Yes  Please assist patient further

## 2024-06-12 MED ORDER — NAPROXEN 500 MG PO TABS: 500.0000 mg | ORAL_TABLET | Freq: Two times a day (BID) | ORAL | 0 refills | Status: DC

## 2024-06-12 MED ORDER — CYCLOBENZAPRINE HCL 5 MG PO TABS: 5.0000 mg | ORAL_TABLET | Freq: Every evening | ORAL | 0 refills | Status: DC | PRN

## 2024-07-15 ENCOUNTER — Ambulatory Visit: Payer: Self-pay

## 2024-07-15 NOTE — Telephone Encounter (Signed)
 FYI Only or Action Required?: FYI only for provider.  Patient was last seen in primary care on 04/04/2024 by Zarwolo, Gloria, FNP.  Called Nurse Triage reporting Hip Pain.  Symptoms began several days ago.  Interventions attempted: OTC medications: ibuprofen .  Symptoms are: right hip pain radiates down right leg with burning pain in the right knee, limping when walking due to pain gradually worsening at night.  Triage Disposition: See PCP When Office is Open (Within 3 Days)  Patient/caregiver understands and will follow disposition?: Yes               Copied from CRM [id number]. Topic: Clinical - Red Word Triage >> Jul 15, 2024  8:54 AM Suzen RAMAN wrote: Red Word that prompted transfer to Nurse Triage: hip pain that radiates down to foot Reason for Disposition  [1] MODERATE pain (e.g., interferes with normal activities, limping) AND [2] present > 3 days  Answer Assessment - Initial Assessment Questions 1. LOCATION and RADIATION: Where is the pain located? Does the pain spread (shoot) anywhere else?     Right hip pain, shoots down right leg to the foot.  2. QUALITY: What does the pain feel like?  (e.g., sharp, dull, aching, burning)     Burning mainly around the knee.   3. SEVERITY: How bad is the pain? What does it keep you from doing?   (Scale 1-10; or mild, moderate, severe)     4/10. She states she is walking with a limp from the pain. She states she is treating with ibuprofen .  4. ONSET: When did the pain start? Does it come and go, or is it there all the time?     Thursday, constant. Worsens at night.  5. WORK OR EXERCISE: Has there been any recent work or exercise that involved this part of the body?      No.  6. CAUSE: What do you think is causing the hip pain?      No. She states she had a similar pain about 6 years ago but she states she was not told a diagnosis, she states she was seen in the ED back then for it.  7. AGGRAVATING FACTORS:  What makes the hip pain worse? (e.g., walking, climbing stairs, running)     She states at night when sleeping certain positions; walking.  8. OTHER SYMPTOMS: Do you have any other symptoms? (e.g., back pain, pain shooting down leg,  fever, rash)     No fever, rash, back pain, problems with bowel or bladder control.  Protocols used: Hip Pain-A-AH

## 2024-07-15 NOTE — Telephone Encounter (Signed)
Noted appointment made

## 2024-07-16 ENCOUNTER — Encounter: Payer: Self-pay | Admitting: Family Medicine

## 2024-07-16 ENCOUNTER — Ambulatory Visit (INDEPENDENT_AMBULATORY_CARE_PROVIDER_SITE_OTHER): Payer: Self-pay | Admitting: Family Medicine

## 2024-07-16 VITALS — BP 142/82 | HR 92 | Resp 16 | Ht 67.0 in | Wt 250.1 lb

## 2024-07-16 DIAGNOSIS — M25511 Pain in right shoulder: Secondary | ICD-10-CM

## 2024-07-16 DIAGNOSIS — I10 Essential (primary) hypertension: Secondary | ICD-10-CM | POA: Diagnosis not present

## 2024-07-16 DIAGNOSIS — M25561 Pain in right knee: Secondary | ICD-10-CM | POA: Diagnosis not present

## 2024-07-16 DIAGNOSIS — G8929 Other chronic pain: Secondary | ICD-10-CM | POA: Diagnosis not present

## 2024-07-16 DIAGNOSIS — E1165 Type 2 diabetes mellitus with hyperglycemia: Secondary | ICD-10-CM | POA: Diagnosis not present

## 2024-07-16 MED ORDER — PREDNISONE 10 MG PO TABS: 10.0000 mg | ORAL_TABLET | Freq: Two times a day (BID) | ORAL | 0 refills | Status: DC

## 2024-07-16 MED ORDER — KETOROLAC TROMETHAMINE 60 MG/2ML IM SOLN
60.0000 mg | Freq: Once | INTRAMUSCULAR | Status: AC
  Administered 2024-07-16: 60 mg via INTRAMUSCULAR

## 2024-07-16 MED ORDER — METHYLPREDNISOLONE ACETATE 80 MG/ML IJ SUSP
80.0000 mg | Freq: Once | INTRAMUSCULAR | Status: AC
  Administered 2024-07-16: 80 mg via INTRAMUSCULAR

## 2024-07-16 NOTE — Assessment & Plan Note (Signed)
 Diabetes associated with hypertension, hyperlipidemia, and obesity  Shawna Huerta is reminded of the importance of commitment to daily physical activity for 30 minutes or more, as able and the need to limit carbohydrate intake to 30 to 60 grams per meal to help with blood sugar control.   The need to take medication as prescribed, test blood sugar as directed, and to call between visits if there is a concern that blood sugar is uncontrolled is also discussed.   Shawna Huerta is reminded of the importance of daily foot exam, annual eye examination, and good blood sugar, blood pressure and cholesterol control.     Latest Ref Rng & Units 04/04/2024   11:58 AM 10/16/2023   11:14 AM 07/12/2023   10:11 AM 04/11/2023   11:15 AM 01/30/2023   11:50 AM  Diabetic Labs  HbA1c 4.8 - 5.6 % 9.9  8.0  6.1  6.1    Chol 100 - 199 mg/dL 895  873  853     HDL >60 mg/dL 45  62  65     Calc LDL 0 - 99 mg/dL 45  53  69     Triglycerides 0 - 149 mg/dL 62  49  57     Creatinine 0.57 - 1.00 mg/dL 9.07  9.09  8.99   9.13       07/16/2024    2:28 PM 07/16/2024    1:57 PM 04/04/2024   11:16 AM 04/04/2024   10:59 AM 02/12/2024   10:17 AM 02/12/2024   10:08 AM 11/20/2023   10:43 AM  BP/Weight  Systolic BP 142 158 150 161 157 158 --  Diastolic BP 82 80 80 104 83 94 --  Wt. (Lbs)  250.08  258.12  258.12 240  BMI  39.17 kg/m2  40.43 kg/m2  40.43 kg/m2 37.59 kg/m2      Latest Ref Rng & Units 01/09/2024   11:11 AM 04/11/2023   10:20 AM  Foot/eye exam completion dates  Eye Exam No Retinopathy No Retinopathy       Foot Form Completion   Done     This result is from an external source.      Updated lab needed at/ before next visit. And urine ACR today, fwd to PCP

## 2024-07-16 NOTE — Progress Notes (Signed)
 Shawna Huerta     MRN: [id number]      DOB: [birth year]  Chief Complaint  Patient presents with   Hip Pain    Complains of right hip pain that radiates down to right knee since last week. Pain stays constant.     HPI Ms. Shawna Huerta is here with a 5 day h/o unprovoked right lateral knee pain ansd swelling, 2nd episode ion 6 years, feels unstable at times Was recently r treated with PT for rigth shoulder pain still also reporting limitation in mobility and pain ROS Denies recent fever or chills. Denies sinus pressure, nasal congestion, ear pain or sore throat. Denies chest congestion, productive cough or wheezing. Denies chest pains, palpitations and leg swelling Denies abdominal pain, nausea, vomiting,diarrhea or constipation.   Denies dysuria, frequency, hesitancy or incontinence. Denies headaches, seizures, numbness, or tingling. Denies depression, anxiety disturbed sdleep due to knee pain Denies skin break down or rash.   PE  BP (!) 142/82   Pulse 92   Resp 16   Ht 5' 7 (1.702 m)   Wt 250 lb 1.3 oz (113.4 kg)   SpO2 95%   BMI 39.17 kg/m   Patient alert and oriented and in no cardiopulmonary distress.  HEENT: No facial asymmetry, EOMI,     Neck supple .  Chest: Clear to auscultation bilaterally.  CVS: S1, S2 no murmurs, no S3.Regular rate.  ABD: Soft non tender.   Ext: No edema  MS: Adequate ROM spine, redued in rig shouldert, and also right knee which has tenderness  and swelling on lateral aspect.  Skin: Intact, no ulcerations or rash noted.  Psych: Good eye contact, normal affect. Memory intact not anxious or depressed appearing.  CNS: CN 2-12 intact, power,  normal throughout.no focal deficits noted.   Assessment & Plan  Essential hypertension Remains elev ated, work on non pharmaco improvement , Primary will f/u in next 2 monhts DASH diet and commitment to daily physical activity for a minimum of 30 minutes discussed and encouraged, as a part of hypertension  management. The importance of attaining a healthy weight is also discussed.     07/16/2024    2:28 PM 07/16/2024    1:57 PM 04/04/2024   11:16 AM 04/04/2024   10:59 AM 02/12/2024   10:17 AM 02/12/2024   10:08 AM 11/20/2023   10:43 AM  BP/Weight  Systolic BP 142 158 150 161 157 158 --  Diastolic BP 82 80 80 104 83 94 --  Wt. (Lbs)  250.08  258.12  258.12 240  BMI  39.17 kg/m2  40.43 kg/m2  40.43 kg/m2 37.59 kg/m2       Chronic right shoulder pain Persistent despite recent PT, Ortho eval  Type 2 diabetes mellitus with hyperglycemia (HCC) Diabetes associated with hypertension, hyperlipidemia, and obesity  Shawna Huerta is reminded of the importance of commitment to daily physical activity for 30 minutes or more, as able and the need to limit carbohydrate intake to 30 to 60 grams per meal to help with blood sugar control.   The need to take medication as prescribed, test blood sugar as directed, and to call between visits if there is a concern that blood sugar is uncontrolled is also discussed.   Shawna Huerta is reminded of the importance of daily foot exam, annual eye examination, and good blood sugar, blood pressure and cholesterol control.     Latest Ref Rng & Units 04/04/2024   11:58 AM 10/16/2023   11:14 AM 07/12/2023  10:11 AM 04/11/2023   11:15 AM 01/30/2023   11:50 AM  Diabetic Labs  HbA1c 4.8 - 5.6 % 9.9  8.0  6.1  6.1    Chol 100 - 199 mg/dL 895  873  853     HDL >60 mg/dL 45  62  65     Calc LDL 0 - 99 mg/dL 45  53  69     Triglycerides 0 - 149 mg/dL 62  49  57     Creatinine 0.57 - 1.00 mg/dL 9.07  9.09  8.99   9.13       07/16/2024    2:28 PM 07/16/2024    1:57 PM 04/04/2024   11:16 AM 04/04/2024   10:59 AM 02/12/2024   10:17 AM 02/12/2024   10:08 AM 11/20/2023   10:43 AM  BP/Weight  Systolic BP 142 158 150 161 157 158 --  Diastolic BP 82 80 80 104 83 94 --  Wt. (Lbs)  250.08  258.12  258.12 240  BMI  39.17 kg/m2  40.43 kg/m2  40.43 kg/m2 37.59 kg/m2      Latest Ref Rng &  Units 01/09/2024   11:11 AM 04/11/2023   10:20 AM  Foot/eye exam completion dates  Eye Exam No Retinopathy No Retinopathy       Foot Form Completion   Done     This result is from an external source.      Updated lab needed at/ before next visit. And urine ACR today, fwd to PCP  Lateral knee pain, right Toradol  60 mg IM and depo medrol  80 mg Im in offie followed by 5 day course of orl prednisone , refer Ortho as very debilitating , may cancel if resolves fully, check uric acid level

## 2024-07-16 NOTE — Assessment & Plan Note (Signed)
 Toradol  60 mg IM and depo medrol  80 mg Im in offie followed by 5 day course of orl prednisone , refer Ortho as very debilitating , may cancel if resolves fully, check uric acid level

## 2024-07-16 NOTE — Patient Instructions (Addendum)
 Follow-up with PCP as before.  You are treated today for acute pain of the right knee.  Toradol  60 mg IM and Depo-Medrol  80 mg IM in the office.  Prednisone  is prescribed for 5 days take as directed.  Your blood pressure remains elevated at this visit.  Work on weight loss, eating primarily vegetable and fruit.  And when able to commit to 30 minutes of exercise every day.Limit salt intake  Labs today which will be forwarded to your primary care Provider  are  hemoglobin A1c uric acid level and BMP and eGFR and urine ACR.  You are referred to orthopedics for evaluation of the right knee as well as your right shoulder.  Thanks for choosing [facility], we consider it a privelige to serve you.

## 2024-07-16 NOTE — Assessment & Plan Note (Signed)
 Remains elev ated, work on non pharmaco improvement , Primary will f/u in next 2 monhts DASH diet and commitment to daily physical activity for a minimum of 30 minutes discussed and encouraged, as a part of hypertension management. The importance of attaining a healthy weight is also discussed.     07/16/2024    2:28 PM 07/16/2024    1:57 PM 04/04/2024   11:16 AM 04/04/2024   10:59 AM 02/12/2024   10:17 AM 02/12/2024   10:08 AM 11/20/2023   10:43 AM  BP/Weight  Systolic BP 142 158 150 161 157 158 --  Diastolic BP 82 80 80 104 83 94 --  Wt. (Lbs)  250.08  258.12  258.12 240  BMI  39.17 kg/m2  40.43 kg/m2  40.43 kg/m2 37.59 kg/m2

## 2024-07-16 NOTE — Assessment & Plan Note (Signed)
 Persistent despite recent PT, Ortho eval

## 2024-07-18 ENCOUNTER — Ambulatory Visit: Payer: Self-pay | Admitting: Family Medicine

## 2024-07-19 LAB — BMP8+EGFR
BUN/Creatinine Ratio: 16 (ref 12–28)
BUN: 18 mg/dL (ref 8–27)
CO2: 21 mmol/L (ref 20–29)
Calcium: 10.2 mg/dL (ref 8.7–10.3)
Chloride: 105 mmol/L (ref 96–106)
Creatinine, Ser: 1.13 mg/dL — ABNORMAL HIGH (ref 0.57–1.00)
Glucose: 90 mg/dL (ref 70–99)
Potassium: 5.4 mmol/L — ABNORMAL HIGH (ref 3.5–5.2)
Sodium: 139 mmol/L (ref 134–144)
eGFR: 56 mL/min/1.73 — ABNORMAL LOW (ref >59)

## 2024-07-19 LAB — URIC ACID: Uric Acid: 8.2 mg/dL — ABNORMAL HIGH (ref 3.0–7.2)

## 2024-07-19 LAB — MICROALBUMIN / CREATININE URINE RATIO
Creatinine, Urine: 116.5 mg/dL
Microalb/Creat Ratio: 9 mg/g{creat} (ref 0–29)
Microalbumin, Urine: 11 ug/mL

## 2024-07-19 LAB — HEMOGLOBIN A1C
Est. average glucose Bld gHb Est-mCnc: 154 mg/dL
Hgb A1c MFr Bld: 7 % — ABNORMAL HIGH (ref 4.8–5.6)

## 2024-07-22 ENCOUNTER — Other Ambulatory Visit: Payer: Self-pay

## 2024-07-22 DIAGNOSIS — M7501 Adhesive capsulitis of right shoulder: Secondary | ICD-10-CM

## 2024-08-02 DIAGNOSIS — Z008 Encounter for other general examination: Secondary | ICD-10-CM | POA: Diagnosis not present

## 2024-08-05 ENCOUNTER — Encounter: Payer: Self-pay | Admitting: Family Medicine

## 2024-08-05 ENCOUNTER — Ambulatory Visit (INDEPENDENT_AMBULATORY_CARE_PROVIDER_SITE_OTHER): Admitting: Family Medicine

## 2024-08-05 VITALS — BP 138/82 | HR 92 | Ht 67.0 in | Wt 246.0 lb

## 2024-08-05 DIAGNOSIS — M7501 Adhesive capsulitis of right shoulder: Secondary | ICD-10-CM

## 2024-08-05 DIAGNOSIS — E559 Vitamin D deficiency, unspecified: Secondary | ICD-10-CM

## 2024-08-05 DIAGNOSIS — E1169 Type 2 diabetes mellitus with other specified complication: Secondary | ICD-10-CM

## 2024-08-05 DIAGNOSIS — E785 Hyperlipidemia, unspecified: Secondary | ICD-10-CM

## 2024-08-05 DIAGNOSIS — E038 Other specified hypothyroidism: Secondary | ICD-10-CM | POA: Diagnosis not present

## 2024-08-05 DIAGNOSIS — E1165 Type 2 diabetes mellitus with hyperglycemia: Secondary | ICD-10-CM

## 2024-08-05 DIAGNOSIS — I10 Essential (primary) hypertension: Secondary | ICD-10-CM | POA: Diagnosis not present

## 2024-08-05 DIAGNOSIS — Z7984 Long term (current) use of oral hypoglycemic drugs: Secondary | ICD-10-CM | POA: Diagnosis not present

## 2024-08-05 MED ORDER — NAPROXEN 500 MG PO TABS: 500.0000 mg | ORAL_TABLET | Freq: Two times a day (BID) | ORAL | 0 refills | Status: AC

## 2024-08-05 NOTE — Assessment & Plan Note (Signed)
 The patient's blood pressure is controlled in the clinic today. She was encouraged to continue taking olmesartan  20 mg daily and to maintain lifestyle modifications, including a low-sodium diet and increased physical activity.

## 2024-08-05 NOTE — Assessment & Plan Note (Signed)
 The patient was encouraged to continue taking atorvastatin  10 mg daily for cholesterol management. Lifestyle modifications were also discussed, including avoiding simple carbohydrates such as cakes, sweet desserts, ice cream, soda (diet or regular), sweet tea, candies, chips, cookies, store-bought juices, excessive alcohol (more than 1-2 drinks per day), lemonade, artificial sweeteners, donuts, coffee creamers, and sugar-free products. Additionally, the patient was advised to reduce the consumption of greasy, fatty foods and increase physical activity to support cardiovascular health. The patient verbalized understanding and is aware of the plan of care.  Lab Results  Component Value Date   CHOL 104 04/04/2024   HDL 45 04/04/2024   LDLCALC 45 04/04/2024   TRIG 62 04/04/2024   CHOLHDL 2.3 04/04/2024

## 2024-08-05 NOTE — Patient Instructions (Addendum)
 I appreciate the opportunity to provide care to you today!    Follow up:  5 months  Labs: please stop by the lab today to get your blood drawn (CBC,  TSH, Lipid profile,  Vit D)  For a Healthier YOU, I Recommend: Reducing your intake of sugar, sodium, carbohydrates, and saturated fats. Increasing your fiber intake by incorporating more whole grains, fruits, and vegetables into your meals. Setting healthy goals with a focus on lowering your consumption of carbs, sugar, and unhealthy fats. Adding variety to your diet by including a wide range of fruits and vegetables. Cutting back on soda and limiting processed foods as much as possible. Staying active: In addition to taking your weight loss medication, aim for at least 150 minutes of moderate-intensity physical activity each week for optimal results.    Please follow up if your symptoms worsen or fail to improve.    Please continue to a heart-healthy diet and increase your physical activities. Try to exercise for at least five days a week.    It was a pleasure to see you and I look forward to continuing to work together on your health and well-being. Please do not hesitate to call the office if you need care or have questions about your care.  In case of emergency, please visit the Emergency Department for urgent care, or contact our clinic at [phone] to schedule an appointment. We're here to help you!   Have a wonderful day and week. With Gratitude, Mairen Wallenstein MSN, FNP-BC

## 2024-08-05 NOTE — Assessment & Plan Note (Signed)
 The patient is currently taking metformin  1000 mg twice daily and Ozempic  0.5 mg weekly. She was encouraged to continue her current treatment regimen while reducing her intake of high-sugar foods and beverages and increasing physical activity. The patient verbalized understanding of the plan of care.

## 2024-08-05 NOTE — Progress Notes (Signed)
 Established Patient Office Visit  Subjective:  Patient ID: Shawna Huerta, female    DOB: 09-Oct-1961  Age: 63 y.o. MRN: [id number]  CC:  Chief Complaint  Patient presents with   Hypertension    HPI Shawna Huerta is a 63 y.o. female with past medical history of essential hypertension, type II diabetes, hyperlipidemia presents for f/u of  chronic medical conditions. For the details of today's visit, please refer to the assessment and plan.     Past Medical History:  Diagnosis Date   Allergy 2006   Contrast dye   Anemia    Arthritis    Bartholin cyst 07/03/2014   Colitis 08/2015   Treated at [facility]   Diabetes mellitus    Encounter for immunization 02/12/2024   Hyperlipidemia LDL goal <70 04/09/2022   Hypertension    Labial abscess 08/2015   Status post I&D by Dr. Maranda   Nicotine  addiction 09/03/2013   Obesity    Pancreatitis 09/2015   unknown etiology, no further work-up with gastroenterology    Tuberculosis 1970   Vaginal itching 09/03/2013    Past Surgical History:  Procedure Laterality Date   ABDOMINAL HYSTERECTOMY     ABDOMINAL SURGERY     exploratry with TAH   BALLOON DILATION N/A 10/26/2016   Procedure: Pyloric channel BALLOON DILATION;  Surgeon: Lamar CHRISTELLA Hollingshead, MD;  Location: [facility];  Service: Endoscopy;  Laterality: N/A;   BARTHOLIN GLAND CYST EXCISION Right 08/20/2014   Procedure: EXCISION OF RIGHT BARTHOLIN'S GLAND CYST;  Surgeon: Vonn VEAR Inch, MD;  Location: [facility];  Service: Gynecology;  Laterality: Right;   CHOLECYSTECTOMY     COLONOSCOPY  10/2015   Lonni Raw: mild diverticulosis, at least 8 polpys removed, multiple tubular adenomas. next tcs in 1 year   ESOPHAGOGASTRODUODENOSCOPY N/A 10/26/2016   Procedure: ESOPHAGOGASTRODUODENOSCOPY (EGD);  Surgeon: Lamar CHRISTELLA Hollingshead, MD;  Location: [facility];  Service: Endoscopy;  Laterality: N/A;   FOOT SURGERY     FOOT SURGERY Right 2011   shave bone   TONSILLECTOMY     TUBAL LIGATION   1990    Family History  Problem Relation Age of Onset   Diabetes Mother    Stroke Mother    Arthritis Mother    Cancer Father        liver and colon. Patient unsure primary    Stomach cancer Father    Diabetes Sister    Breast cancer Sister    Stomach cancer Sister    Liver cancer Sister    Bone cancer Sister    Diabetes Brother    Lung cancer Brother    Cancer Brother    Heart disease Son    Early death Son     Social History   Socioeconomic History   Marital status: Single    Spouse name: Not on file   Number of children: 1   Years of education: Not on file   Highest education level: Some college, no degree  Occupational History   Not on file  Tobacco Use   Smoking status: Former    Current packs/day: 0.00    Average packs/day: 0.3 packs/day for 3.0 years (0.8 ttl pk-yrs)    Types: Cigarettes    Start date: 10/21/2013    Quit date: 10/21/2016    Years since quitting: 7.7   Smokeless tobacco: Never  Vaping Use   Vaping status: Never Used  Substance and Sexual Activity   Alcohol use: No  Drug use: Not Currently    Types: Marijuana    Comment: started using marijuana a month on the weekends to cope with depression.   Sexual activity: Not Currently    Birth control/protection: Surgical  Other Topics Concern   Not on file  Social History Narrative   Had one child now deceased   Lives with her sister, sister recently diagnosed with cancer.    Social Drivers of Corporate investment banker Strain: Low Risk  (08/01/2024)   Overall Financial Resource Strain (CARDIA)    Difficulty of Paying Living Expenses: Not very hard  Food Insecurity: No Food Insecurity (08/01/2024)   Hunger Vital Sign    Worried About Running Out of Food in the Last Year: Never true    Ran Out of Food in the Last Year: Never true  Transportation Needs: No Transportation Needs (08/01/2024)   PRAPARE - Administrator, Civil Service (Medical): No    Lack of Transportation  (Non-Medical): No  Physical Activity: Insufficiently Active (02/05/2024)   Exercise Vital Sign    Days of Exercise per Week: 2 days    Minutes of Exercise per Session: 30 min  Stress: No Stress Concern Present (08/01/2024)   [employer] of Occupational Health - Occupational Stress Questionnaire    Feeling of Stress: Only a little  Social Connections: Moderately Integrated (08/01/2024)   Social Connection and Isolation Panel    Frequency of Communication with Friends and Family: More than three times a week    Frequency of Social Gatherings with Friends and Family: More than three times a week    Attends Religious Services: More than 4 times per year    Active Member of [employer] or Organizations: Yes    Attends Banker Meetings: More than 4 times per year    Marital Status: Separated  Intimate Partner Violence: Not At Risk (11/20/2023)   Humiliation, Afraid, Rape, and Kick questionnaire    Fear of Current or Ex-Partner: No    Emotionally Abused: No    Physically Abused: No    Sexually Abused: No    Outpatient Medications Prior to Visit  Medication Sig Dispense Refill   acetaminophen  (TYLENOL ) 325 MG tablet Take 2 tablets (650 mg total) by mouth every 6 (six) hours as needed for moderate pain (pain score 4-6). 30 tablet 0   atorvastatin  (LIPITOR) 10 MG tablet TAKE 1 TABLET BY MOUTH EVERY DAY 90 tablet 3   cyclobenzaprine  (FLEXERIL ) 5 MG tablet TAKE 1 TABLET BY MOUTH AT BEDTIME AS NEEDED FOR MUSCLE SPASMS. 30 tablet 0   gabapentin  (NEURONTIN ) 300 MG capsule Take 1 capsule (300 mg total) by mouth 2 (two) times daily. (Patient taking differently: Take 300 mg by mouth 2 (two) times daily. TAKES PRN) 60 capsule 2   Lancets (ONETOUCH ULTRASOFT) lancets Use as instructed 100 each 12   metFORMIN  (GLUCOPHAGE ) 1000 MG tablet Take 1 tablet (1,000 mg total) by mouth 2 (two) times daily with a meal. 180 tablet 3   metoCLOPramide  (REGLAN ) 5 MG tablet TAKE 1 TABLET BY MOUTH 4 TIMES DAILY.  90 tablet 2   olmesartan  (BENICAR ) 20 MG tablet Take 1 tablet (20 mg total) by mouth daily. 90 tablet 1   ONETOUCH VERIO test strip once daily testing dx e11.65 100 each 5   Semaglutide ,0.25 or 0.5MG /DOS, (OZEMPIC , 0.25 OR 0.5 MG/DOSE,) 2 MG/3ML SOPN Inject 0.25 mg into the skin once a week. (Patient taking differently: Inject 0.5 mg into the skin once a  week.) 3 mL 0   Vitamin D , Ergocalciferol , (DRISDOL ) 1.25 MG (50000 UNIT) CAPS capsule TAKE 1 CAPSULE (50,000 UNITS TOTAL) BY MOUTH EVERY 7 (SEVEN) DAYS 12 capsule 3   naproxen  (NAPROSYN ) 500 MG tablet Take 1 tablet (500 mg total) by mouth 2 (two) times daily with a meal. 30 tablet 0   predniSONE  (DELTASONE ) 10 MG tablet Take 1 tablet (10 mg total) by mouth 2 (two) times daily with a meal. 10 tablet 0   No facility-administered medications prior to visit.    Allergies  Allergen Reactions   Gadolinium Derivatives Other (See Comments)   Iohexol  Swelling     Code: HIVES, Desc: PT STATES THE EYES SWOLL AND ITCHED, NEEDS PRE MEDS     ROS Review of Systems  Constitutional:  Negative for chills and fever.  Eyes:  Negative for visual disturbance.  Respiratory:  Negative for chest tightness and shortness of breath.   Neurological:  Negative for dizziness and headaches.      Objective:    Physical Exam HENT:     Head: Normocephalic.     Mouth/Throat:     Mouth: Mucous membranes are moist.  Cardiovascular:     Rate and Rhythm: Normal rate.     Heart sounds: Normal heart sounds.  Pulmonary:     Effort: Pulmonary effort is normal.     Breath sounds: Normal breath sounds.  Neurological:     Mental Status: She is alert.     BP 138/82   Pulse 92   Ht 5' 7 (1.702 m)   Wt 246 lb (111.6 kg)   SpO2 97%   BMI 38.53 kg/m  Wt Readings from Last 3 Encounters:  08/05/24 246 lb (111.6 kg)  07/16/24 250 lb 1.3 oz (113.4 kg)  04/04/24 258 lb 1.9 oz (117.1 kg)    Lab Results  Component Value Date   TSH 1.430 04/04/2024   Lab  Results  Component Value Date   WBC 9.1 04/04/2024   HGB 10.9 (L) 04/04/2024   HCT 36.6 04/04/2024   MCV 71 (L) 04/04/2024   PLT 279 04/04/2024   Lab Results  Component Value Date   NA 139 07/16/2024   K 5.4 (H) 07/16/2024   CO2 21 07/16/2024   GLUCOSE 90 07/16/2024   BUN 18 07/16/2024   CREATININE 1.13 (H) 07/16/2024   BILITOT 0.2 04/04/2024   ALKPHOS 70 04/04/2024   AST 13 04/04/2024   ALT 14 04/04/2024   PROT 7.5 04/04/2024   ALBUMIN 4.4 04/04/2024   CALCIUM  10.2 07/16/2024   ANIONGAP 8 01/20/2023   EGFR 56 (L) 07/16/2024   Lab Results  Component Value Date   CHOL 104 04/04/2024   Lab Results  Component Value Date   HDL 45 04/04/2024   Lab Results  Component Value Date   LDLCALC 45 04/04/2024   Lab Results  Component Value Date   TRIG 62 04/04/2024   Lab Results  Component Value Date   CHOLHDL 2.3 04/04/2024   Lab Results  Component Value Date   HGBA1C 7.0 (H) 07/16/2024      Assessment & Plan:  Essential hypertension Assessment & Plan: The patient's blood pressure is controlled in the clinic today. She was encouraged to continue taking olmesartan  20 mg daily and to maintain lifestyle modifications, including a low-sodium diet and increased physical activity.    Type 2 diabetes mellitus with hyperglycemia, without long-term current use of insulin  [facility]) Assessment & Plan: The patient is currently taking metformin  1000  mg twice daily and Ozempic  0.5 mg weekly. She was encouraged to continue her current treatment regimen while reducing her intake of high-sugar foods and beverages and increasing physical activity. The patient verbalized understanding of the plan of care.    Orders: -     HM Diabetes Foot Exam  Hyperlipidemia associated with type 2 diabetes mellitus (HCC) Assessment & Plan: The patient was encouraged to continue taking atorvastatin  10 mg daily for cholesterol management. Lifestyle modifications were also discussed, including  avoiding simple carbohydrates such as cakes, sweet desserts, ice cream, soda (diet or regular), sweet tea, candies, chips, cookies, store-bought juices, excessive alcohol (more than 1-2 drinks per day), lemonade, artificial sweeteners, donuts, coffee creamers, and sugar-free products. Additionally, the patient was advised to reduce the consumption of greasy, fatty foods and increase physical activity to support cardiovascular health. The patient verbalized understanding and is aware of the plan of care.  Lab Results  Component Value Date   CHOL 104 04/04/2024   HDL 45 04/04/2024   LDLCALC 45 04/04/2024   TRIG 62 04/04/2024   CHOLHDL 2.3 04/04/2024      Adhesive capsulitis of right shoulder Assessment & Plan: Refill sent to the pharmacy  Orders: -     Naproxen ; Take 1 tablet (500 mg total) by mouth 2 (two) times daily with a meal.  Dispense: 30 tablet; Refill: 0  Hyperlipidemia LDL goal <70 -     Lipid panel -     CBC with Differential/Platelet  Vitamin D  deficiency -     VITAMIN D  25 Hydroxy (Vit-D Deficiency, Fractures)  TSH (thyroid -stimulating hormone deficiency) -     TSH + free T4  Note: This chart has been completed using Engineer, civil (consulting) software, and while attempts have been made to ensure accuracy, certain words and phrases may not be transcribed as intended.    Follow-up: Return in about 5 months (around 01/05/2025).   Teddie Mehta, FNP

## 2024-08-05 NOTE — Assessment & Plan Note (Signed)
 Refill sent to the pharmacy

## 2024-08-06 LAB — CBC WITH DIFFERENTIAL/PLATELET
Basophils Absolute: 0 x10E3/uL (ref 0.0–0.2)
Basos: 0 %
EOS (ABSOLUTE): 0.2 x10E3/uL (ref 0.0–0.4)
Eos: 2 %
Hematocrit: 34.8 % (ref 34.0–46.6)
Hemoglobin: 10 g/dL — ABNORMAL LOW (ref 11.1–15.9)
Immature Grans (Abs): 0 x10E3/uL (ref 0.0–0.1)
Immature Granulocytes: 0 %
Lymphocytes Absolute: 2.9 x10E3/uL (ref 0.7–3.1)
Lymphs: 31 %
MCH: 21.4 pg — ABNORMAL LOW (ref 26.6–33.0)
MCHC: 28.7 g/dL — ABNORMAL LOW (ref 31.5–35.7)
MCV: 75 fL — ABNORMAL LOW (ref 79–97)
Monocytes Absolute: 0.5 x10E3/uL (ref 0.1–0.9)
Monocytes: 6 %
Neutrophils Absolute: 5.5 x10E3/uL (ref 1.4–7.0)
Neutrophils: 61 %
Platelets: 245 x10E3/uL (ref 150–450)
RBC: 4.67 x10E6/uL (ref 3.77–5.28)
RDW: 18.1 % — ABNORMAL HIGH (ref 11.7–15.4)
WBC: 9.1 x10E3/uL (ref 3.4–10.8)

## 2024-08-06 LAB — LIPID PANEL
Chol/HDL Ratio: 2.4 ratio (ref 0.0–4.4)
Cholesterol, Total: 104 mg/dL (ref 100–199)
HDL: 43 mg/dL (ref >39)
LDL Chol Calc (NIH): 43 mg/dL (ref 0–99)
Triglycerides: 91 mg/dL (ref 0–149)
VLDL Cholesterol Cal: 18 mg/dL (ref 5–40)

## 2024-08-06 LAB — TSH+FREE T4
Free T4: 1.3 ng/dL (ref 0.82–1.77)
TSH: 1.59 u[IU]/mL (ref 0.450–4.500)

## 2024-08-06 LAB — VITAMIN D 25 HYDROXY (VIT D DEFICIENCY, FRACTURES): Vit D, 25-Hydroxy: 51.9 ng/mL (ref 30.0–100.0)

## 2024-08-09 ENCOUNTER — Ambulatory Visit: Payer: Self-pay | Admitting: Family Medicine

## 2024-09-04 ENCOUNTER — Other Ambulatory Visit: Payer: Self-pay | Admitting: Family Medicine

## 2024-09-04 DIAGNOSIS — R7301 Impaired fasting glucose: Secondary | ICD-10-CM

## 2024-09-05 NOTE — Progress Notes (Signed)
 Shawna Huerta                                          MRN: [id number]   09/05/2024   The [facility] Quality Team Specialist reviewed this patient medical record for the purposes of chart review for care gap closure. The following were reviewed: abstraction for care gap closure-controlling blood pressure.    [facility] Quality Team

## 2024-10-01 ENCOUNTER — Telehealth: Payer: Self-pay

## 2024-10-01 NOTE — Telephone Encounter (Signed)
 Patient currently receives Ozempic  from [employer] patient assistance company. The following changes will take place 11/28/24:  Medicare patients will no longer be eligible for Ozempic .    Uninsured patients will still have access to Ozempic ; however, their total household income must be at or below 200% of the federal poverty level.   Rybelsus  will be removed completely from the program.   Medicare patients on insulin  whose household income is below 150% of the federal poverty level will need to apply for Low Income Subsidy/Extra Help with Social Security. If denied, we will be able to use that letter to attempt enrollment/re-enrollment with the company for all other products.

## 2024-11-06 ENCOUNTER — Other Ambulatory Visit: Payer: Self-pay | Admitting: Podiatry

## 2024-11-25 ENCOUNTER — Ambulatory Visit: Payer: Medicare HMO

## 2024-11-25 VITALS — BP 104/74 | Ht 67.0 in | Wt 250.0 lb

## 2024-11-25 DIAGNOSIS — Z0001 Encounter for general adult medical examination with abnormal findings: Secondary | ICD-10-CM | POA: Diagnosis not present

## 2024-11-25 DIAGNOSIS — I10 Essential (primary) hypertension: Secondary | ICD-10-CM

## 2024-11-25 DIAGNOSIS — Z0000 Encounter for general adult medical examination without abnormal findings: Secondary | ICD-10-CM

## 2024-11-25 DIAGNOSIS — E1169 Type 2 diabetes mellitus with other specified complication: Secondary | ICD-10-CM

## 2024-11-25 DIAGNOSIS — Z124 Encounter for screening for malignant neoplasm of cervix: Secondary | ICD-10-CM

## 2024-11-25 DIAGNOSIS — E1165 Type 2 diabetes mellitus with hyperglycemia: Secondary | ICD-10-CM

## 2024-11-25 MED ORDER — BLOOD GLUCOSE MONITORING SUPPL DEVI: 1.0000 | Freq: Three times a day (TID) | 0 refills | Status: AC

## 2024-11-25 MED ORDER — BLOOD GLUCOSE TEST VI STRP: 1.0000 | ORAL_STRIP | Freq: Three times a day (TID) | 0 refills | Status: AC

## 2024-11-25 MED ORDER — LANCETS MISC: 1.0000 | 0 refills | Status: AC

## 2024-11-25 MED ORDER — OLMESARTAN MEDOXOMIL 20 MG PO TABS: 20.0000 mg | ORAL_TABLET | Freq: Every day | ORAL | 1 refills | Status: AC

## 2024-11-25 MED ORDER — LANCET DEVICE MISC: 1.0000 | Freq: Three times a day (TID) | 0 refills | Status: AC

## 2024-11-25 NOTE — Progress Notes (Signed)
 "  Chief Complaint  Patient presents with   Medicare Wellness     Subjective:   Shawna Huerta is a 63 y.o. female who presents for a Medicare Annual Wellness Visit.  Visit info / Clinical Intake: Medicare Wellness Visit Type:: Subsequent Annual Wellness Visit Persons participating in visit and providing information:: patient Medicare Wellness Visit Mode:: Video Since this visit was completed virtually, some vitals may be partially provided or unavailable. Missing vitals are due to the limitations of the virtual format.: Documented vitals are patient reported If Telephone or Video please confirm:: I connected with patient using audio/video enable telemedicine. I verified patient identity with two identifiers, discussed telehealth limitations, and patient agreed to proceed. Patient Location:: home Provider Location:: office Interpreter Needed?: No Pre-visit prep was completed: yes AWV questionnaire completed by patient prior to visit?: no Living arrangements:: with family/others Patient's Overall Health Status Rating: very good Typical amount of pain: none Does pain affect daily life?: no Are you currently prescribed opioids?: no  Dietary Habits and Nutritional Risks How many meals a day?: 3 Eats fruit and vegetables daily?: yes Most meals are obtained by: preparing own meals In the last 2 weeks, have you had any of the following?: none Diabetic:: (!) yes Any non-healing wounds?: no How often do you check your BS?: 1 Would you like to be referred to a Nutritionist or for Diabetic Management? : no  Functional Status Activities of Daily Living (to include ambulation/medication): Independent Ambulation: Independent Medication Administration: Independent Home Management (perform basic housework or laundry): Independent Manage your own finances?: yes Primary transportation is: driving Concerns about vision?: no *vision screening is required for WTM* Concerns about hearing?:  no  Fall Screening Falls in the past year?: 0 Number of falls in past year: 0 Was there an injury with Fall?: 0 Fall Risk Category Calculator: 0 Patient Fall Risk Level: Low Fall Risk  Fall Risk Patient at Risk for Falls Due to: No Fall Risks Fall risk Follow up: Falls evaluation completed; Education provided; Falls prevention discussed  Home and Transportation Safety: All rugs have non-skid backing?: yes All stairs or steps have railings?: yes Grab bars in the bathtub or shower?: yes Have non-skid surface in bathtub or shower?: yes Good home lighting?: yes Regular seat belt use?: yes Hospital stays in the last year:: no  Cognitive Assessment Difficulty concentrating, remembering, or making decisions? : no Will 6CIT or Mini Cog be Completed: yes What year is it?: 0 points What month is it?: 0 points Give patient an address phrase to remember (5 components): [address] [place] About what time is it?: 0 points Count backwards from 20 to 1: 0 points Say the months of the year in reverse: 0 points Repeat the address phrase from earlier: 0 points 6 CIT Score: 0 points  Advance Directives (For Healthcare) Does Patient Have a Medical Advance Directive?: No Would patient like information on creating a medical advance directive?: Yes (MAU/Ambulatory/Procedural Areas - Information given)  Reviewed/Updated  Reviewed/Updated: Reviewed All (Medical, Surgical, Family, Medications, Allergies, Care Teams, Patient Goals)    Allergies (verified) Gadolinium derivatives and Iohexol    Current Medications (verified) Outpatient Encounter Medications as of 11/25/2024  Medication Sig   acetaminophen  (TYLENOL ) 325 MG tablet Take 2 tablets (650 mg total) by mouth every 6 (six) hours as needed for moderate pain (pain score 4-6).   atorvastatin  (LIPITOR) 10 MG tablet TAKE 1 TABLET BY MOUTH EVERY DAY   Blood Glucose Monitoring Suppl DEVI 1 each by  Does not apply route in the morning, at  noon, and at bedtime. May substitute to any manufacturer covered by patient's insurance.   cyclobenzaprine  (FLEXERIL ) 5 MG tablet TAKE 1 TABLET BY MOUTH AT BEDTIME AS NEEDED FOR MUSCLE SPASMS.   gabapentin  (NEURONTIN ) 300 MG capsule TAKE 1 CAPSULE BY MOUTH TWICE A DAY   Glucose Blood (BLOOD GLUCOSE TEST STRIPS) STRP 1 each by In Vitro route in the morning, at noon, and at bedtime. May substitute to any manufacturer covered by patient's insurance.   Lancet Device MISC 1 each by Does not apply route in the morning, at noon, and at bedtime. May substitute to any manufacturer covered by patient's insurance.   Lancets (ONETOUCH ULTRASOFT) lancets Use as instructed   Lancets MISC 1 each by Does not apply route as directed. Dispense based on patient and insurance preference. Use up to four times daily as directed. (FOR ICD-10 E10.9, E11.9).   metFORMIN  (GLUCOPHAGE ) 1000 MG tablet Take 1 tablet (1,000 mg total) by mouth 2 (two) times daily with a meal.   metoCLOPramide  (REGLAN ) 5 MG tablet TAKE 1 TABLET BY MOUTH 4 TIMES DAILY.   naproxen  (NAPROSYN ) 500 MG tablet Take 1 tablet (500 mg total) by mouth 2 (two) times daily with a meal.   ONETOUCH VERIO test strip USE AS DIRECTED ONCE DAILY FOR TESTING   Semaglutide ,0.25 or 0.5MG /DOS, (OZEMPIC , 0.25 OR 0.5 MG/DOSE,) 2 MG/3ML SOPN Inject 0.25 mg into the skin once a week. (Patient taking differently: Inject 0.5 mg into the skin once a week.)   Vitamin D , Ergocalciferol , (DRISDOL ) 1.25 MG (50000 UNIT) CAPS capsule TAKE 1 CAPSULE (50,000 UNITS TOTAL) BY MOUTH EVERY 7 (SEVEN) DAYS   [DISCONTINUED] olmesartan  (BENICAR ) 20 MG tablet Take 1 tablet (20 mg total) by mouth daily.   olmesartan  (BENICAR ) 20 MG tablet Take 1 tablet (20 mg total) by mouth daily.   No facility-administered encounter medications on file as of 11/25/2024.    History: Past Medical History:  Diagnosis Date   Allergy 2006   Contrast dye   Anemia    Arthritis    Bartholin cyst 07/03/2014    Colitis 08/2015   [facility]   Diabetes mellitus    Encounter for immunization 02/12/2024   Hyperlipidemia LDL goal <70 04/09/2022   Hypertension    Labial abscess 08/2015   Status post I&D by Dr. Maranda   Nicotine  addiction 09/03/2013   Obesity    Pancreatitis 09/2015   unknown etiology, no further work-up with gastroenterology    Tuberculosis 1970   Vaginal itching 09/03/2013   Past Surgical History:  Procedure Laterality Date   ABDOMINAL HYSTERECTOMY     ABDOMINAL SURGERY     exploratry with TAH   BALLOON DILATION N/A 10/26/2016   Procedure: Pyloric channel BALLOON DILATION;  Surgeon: Lamar CHRISTELLA Hollingshead, MD;  Location: [facility];  Service: Endoscopy;  Laterality: N/A;   BARTHOLIN GLAND CYST EXCISION Right 08/20/2014   Procedure: EXCISION OF RIGHT BARTHOLIN'S GLAND CYST;  Surgeon: Vonn VEAR Inch, MD;  Location: [facility];  Service: Gynecology;  Laterality: Right;   CHOLECYSTECTOMY     COLONOSCOPY  10/2015   Lonni Raw: mild diverticulosis, at least 8 polpys removed, multiple tubular adenomas. next tcs in 1 year   ESOPHAGOGASTRODUODENOSCOPY N/A 10/26/2016   Procedure: ESOPHAGOGASTRODUODENOSCOPY (EGD);  Surgeon: Lamar CHRISTELLA Hollingshead, MD;  Location: [facility];  Service: Endoscopy;  Laterality: N/A;   FOOT SURGERY     FOOT SURGERY Right 2011  shave bone   TONSILLECTOMY     TUBAL LIGATION  1990   Family History  Problem Relation Age of Onset   Diabetes Mother    Stroke Mother    Arthritis Mother    Cancer Father        liver and colon. Patient unsure primary    Stomach cancer Father    Diabetes Sister    Breast cancer Sister    Stomach cancer Sister    Liver cancer Sister    Bone cancer Sister    Diabetes Brother    Lung cancer Brother    Cancer Brother    Heart disease Son    Early death Son    Social History   Occupational History   Not on file  Tobacco Use   Smoking status: Former    Current packs/day: 0.00    Average packs/day: 0.3  packs/day for 3.0 years (0.8 ttl pk-yrs)    Types: Cigarettes    Start date: 10/21/2013    Quit date: 10/21/2016    Years since quitting: 8.1   Smokeless tobacco: Never  Vaping Use   Vaping status: Never Used  Substance and Sexual Activity   Alcohol use: No   Drug use: Not Currently    Types: Marijuana    Comment: started using marijuana a month on the weekends to cope with depression.   Sexual activity: Not Currently    Birth control/protection: Surgical   Tobacco Counseling Counseling given: Yes  SDOH Screenings   Food Insecurity: No Food Insecurity (11/25/2024)  Housing: Low Risk (11/25/2024)  Transportation Needs: No Transportation Needs (11/25/2024)  Utilities: Not At Risk (11/25/2024)  Alcohol Screen: Low Risk (02/05/2024)  Depression (PHQ2-9): Low Risk (11/25/2024)  Financial Resource Strain: Low Risk (08/01/2024)  Physical Activity: Sufficiently Active (11/25/2024)  Social Connections: Moderately Integrated (11/25/2024)  Stress: No Stress Concern Present (11/25/2024)  Tobacco Use: Medium Risk (11/25/2024)  Health Literacy: Adequate Health Literacy (11/25/2024)   See flowsheets for full screening details  Depression Screen PHQ 2 & 9 Depression Scale- Over the past 2 weeks, how often have you been bothered by any of the following problems? Little interest or pleasure in doing things: 0 Feeling down, depressed, or hopeless (PHQ Adolescent also includes...irritable): 0 PHQ-2 Total Score: 0 Trouble falling or staying asleep, or sleeping too much: 0 Feeling tired or having little energy: 0 Poor appetite or overeating (PHQ Adolescent also includes...weight loss): 0 Feeling bad about yourself - or that you are a failure or have let yourself or your family down: 0 Trouble concentrating on things, such as reading the newspaper or watching television (PHQ Adolescent also includes...like school work): 0 Moving or speaking so slowly that other people could have noticed. Or the  opposite - being so fidgety or restless that you have been moving around a lot more than usual: 0 Thoughts that you would be better off dead, or of hurting yourself in some way: 0 PHQ-9 Total Score: 0 If you checked off any problems, how difficult have these problems made it for you to do your work, take care of things at home, or get along with other people?: Not difficult at all  Depression Treatment Depression Interventions/Treatment : EYV7-0 Score <4 Follow-up Not Indicated     Goals Addressed             This Visit's Progress    Patient Stated   On track    I would like to lose 40lb this year by exercising, eating  healthy snacks and foods and getting optimal amounts of sleep/rest.              Objective:    Today's Vitals   11/25/24 1107  BP: 104/74  Weight: 250 lb (113.4 kg)  Height: 5' 7 (1.702 m)   Body mass index is 39.16 kg/m.  Hearing/Vision screen Hearing Screening - Comments:: Patient denies any hearing difficulties.   Vision Screening - Comments:: Wears rx glasses - up to date with routine eye exams with  Oneil Kawasaki Immunizations and Health Maintenance Health Maintenance  Topic Date Due   Cervical Cancer Screening (HPV/Pap Cotest)  Never done   Influenza Vaccine  06/28/2024   Medicare Annual Wellness (AWV)  11/19/2024   OPHTHALMOLOGY EXAM  01/08/2025   HEMOGLOBIN A1C  01/16/2025   Mammogram  02/28/2025   Diabetic kidney evaluation - eGFR measurement  07/16/2025   Diabetic kidney evaluation - Urine ACR  07/16/2025   FOOT EXAM  08/05/2025   Colonoscopy  11/09/2025   DTaP/Tdap/Td (2 - Td or Tdap) 10/05/2033   Pneumococcal Vaccine: 50+ Years  Completed   Hepatitis C Screening  Completed   HIV Screening  Completed   Zoster Vaccines- Shingrix  Completed   Hepatitis B Vaccines 19-59 Average Risk  Aged Out   HPV VACCINES  Aged Out   Meningococcal B Vaccine  Aged Out   COVID-19 Vaccine  Discontinued        Assessment/Plan:  This is a routine  wellness examination for Lillyonna.  Patient Care Team: Bacchus, Meade PEDLAR, FNP as PCP - General (Family Medicine) Kawasaki Oneil, DO [facility])  I have personally reviewed and noted the following in the patients chart:   Medical and social history Use of alcohol, tobacco or illicit drugs  Current medications and supplements including opioid prescriptions. Functional ability and status Nutritional status Physical activity Advanced directives List of other physicians Hospitalizations, surgeries, and ER visits in previous 12 months Vitals Screenings to include cognitive, depression, and falls Referrals and appointments  Orders Placed This Encounter  Procedures   Ambulatory referral to Obstetrics / Gynecology    Referral Priority:   Routine    Referral Type:   Consultation    Referral Reason:   Specialty Services Required    Referred to Provider:   Ozan, Jennifer, DO    Requested Specialty:   Obstetrics and Gynecology    Number of Visits Requested:   1   In addition, I have reviewed and discussed with patient certain preventive protocols, quality metrics, and best practice recommendations. A written personalized care plan for preventive services as well as general preventive health recommendations were provided to patient.   Lanae Federer, CMA   11/25/2024   Return on Wednesday November 26, 2025 at 11:20 am in the office, for [facility] with Wellness Nurse.  After Visit Summary: (MyChart) Due to this being a telephonic visit, the after visit summary with patients personalized plan was offered to patient via MyChart    "

## 2024-11-25 NOTE — Patient Instructions (Signed)
 Ms. Fahey,  Thank you for taking the time for your Medicare Wellness Visit. I appreciate your continued commitment to your health goals. Please review the care plan we discussed, and feel free to reach out if I can assist you further.  Please note that Annual Wellness Visits do not include a physical exam. Some assessments may be limited, especially if the visit was conducted virtually. If needed, we may recommend an in-person follow-up with your provider.  Ongoing Care Seeing your primary care provider every 3 to 6 months helps us  monitor your health and provide consistent, personalized care.   1 year follow up for Medicare well visit: Wednesday November 26, 2025 at 11:20 am with medicare wellness nurse in office  Referrals If a referral was made during today's visit and you haven't received any updates within two weeks, please contact the referred provider directly to check on the status.  Cervical Cancer Screening/Pap Smear [facility] Address: [address], [place], [place] [address] Phone: [phone]  A refill has been sent in for your olmesartan . A new prescription was sent in for a new glucose meter and testing supplies to be dispensed based on what your insurance will cover.      Recommended Screenings:  Health Maintenance  Topic Date Due   Pap with HPV screening  Never done   Flu Shot  06/28/2024   Medicare Annual Wellness Visit  11/19/2024   Eye exam for diabetics  01/08/2025   Hemoglobin A1C  01/16/2025   Breast Cancer Screening  02/28/2025   Yearly kidney function blood test for diabetes  07/16/2025   Yearly kidney health urinalysis for diabetes  07/16/2025   Complete foot exam   08/05/2025   Colon Cancer Screening  11/09/2025   DTaP/Tdap/Td vaccine (2 - Td or Tdap) 10/05/2033   Pneumococcal Vaccine for age over 66  Completed   Hepatitis C Screening  Completed   HIV Screening  Completed   Zoster (Shingles) Vaccine  Completed   Hepatitis B Vaccine  Aged  Out   HPV Vaccine  Aged Out   Meningitis B Vaccine  Aged Out   COVID-19 Vaccine  Discontinued       11/25/2024   11:13 AM  Advanced Directives  Does Patient Have a Medical Advance Directive? No  Would patient like information on creating a medical advance directive? Yes (MAU/Ambulatory/Procedural Areas - Information given)    Vision: Annual vision screenings are recommended for early detection of glaucoma, cataracts, and diabetic retinopathy. These exams can also reveal signs of chronic conditions such as diabetes and high blood pressure.  Dental: Annual dental screenings help detect early signs of oral cancer, gum disease, and other conditions linked to overall health, including heart disease and diabetes.  Please see the attached documents for additional preventive care recommendations.

## 2024-12-11 ENCOUNTER — Other Ambulatory Visit: Payer: Self-pay | Admitting: Family Medicine

## 2024-12-11 ENCOUNTER — Other Ambulatory Visit: Payer: Self-pay

## 2024-12-11 DIAGNOSIS — R7301 Impaired fasting glucose: Secondary | ICD-10-CM

## 2024-12-11 DIAGNOSIS — E1165 Type 2 diabetes mellitus with hyperglycemia: Secondary | ICD-10-CM

## 2024-12-11 MED ORDER — LANCETS MISC: 1.0000 | 0 refills | Status: AC

## 2024-12-11 MED ORDER — BLOOD GLUCOSE MONITORING SUPPL DEVI: 1.0000 | Freq: Three times a day (TID) | 0 refills | Status: AC

## 2024-12-11 MED ORDER — BLOOD GLUCOSE TEST VI STRP: 1.0000 | ORAL_STRIP | Freq: Three times a day (TID) | 0 refills | Status: AC

## 2024-12-11 MED ORDER — LANCET DEVICE MISC: 1.0000 | Freq: Three times a day (TID) | 0 refills | Status: AC

## 2024-12-28 ENCOUNTER — Other Ambulatory Visit: Payer: Self-pay | Admitting: Family Medicine

## 2024-12-28 DIAGNOSIS — E559 Vitamin D deficiency, unspecified: Secondary | ICD-10-CM

## 2025-01-06 ENCOUNTER — Ambulatory Visit: Admitting: Family Medicine

## 2025-01-16 ENCOUNTER — Encounter: Admitting: Obstetrics & Gynecology

## 2025-11-26 ENCOUNTER — Ambulatory Visit: Payer: Self-pay
